# Patient Record
Sex: Female | Born: 1985
Health system: Southern US, Community
[De-identification: ages and names within clinical notes are randomized; demographics above are authoritative.]

## PROBLEM LIST (undated history)

## (undated) DIAGNOSIS — E039 Hypothyroidism, unspecified: Secondary | ICD-10-CM

## (undated) DIAGNOSIS — F4310 Post-traumatic stress disorder, unspecified: Secondary | ICD-10-CM

## (undated) DIAGNOSIS — R51 Headache: Secondary | ICD-10-CM

## (undated) DIAGNOSIS — F419 Anxiety disorder, unspecified: Secondary | ICD-10-CM

## (undated) DIAGNOSIS — E119 Type 2 diabetes mellitus without complications: Secondary | ICD-10-CM

## (undated) DIAGNOSIS — E7800 Pure hypercholesterolemia, unspecified: Secondary | ICD-10-CM

## (undated) DIAGNOSIS — E669 Obesity, unspecified: Secondary | ICD-10-CM

## (undated) DIAGNOSIS — E785 Hyperlipidemia, unspecified: Secondary | ICD-10-CM

## (undated) DIAGNOSIS — F32A Depression, unspecified: Secondary | ICD-10-CM

## (undated) DIAGNOSIS — I10 Essential (primary) hypertension: Secondary | ICD-10-CM

## (undated) DIAGNOSIS — G932 Benign intracranial hypertension: Secondary | ICD-10-CM

## (undated) DIAGNOSIS — F410 Panic disorder [episodic paroxysmal anxiety] without agoraphobia: Secondary | ICD-10-CM

## (undated) DIAGNOSIS — E282 Polycystic ovarian syndrome: Secondary | ICD-10-CM

## (undated) HISTORY — DX: Hypothyroidism, unspecified: E03.9

## (undated) HISTORY — DX: Obesity, unspecified: E66.9

## (undated) HISTORY — DX: Post-traumatic stress disorder, unspecified: F43.10

## (undated) HISTORY — DX: Anxiety disorder, unspecified: F41.9

## (undated) HISTORY — DX: Type 2 diabetes mellitus without complications: E11.9

## (undated) HISTORY — PX: LUMBAR PUNCTURE: SHX1985

## (undated) HISTORY — DX: Pure hypercholesterolemia, unspecified: E78.00

## (undated) HISTORY — DX: Essential (primary) hypertension: I10

## (undated) HISTORY — DX: Panic disorder (episodic paroxysmal anxiety): F41.0

## (undated) HISTORY — DX: Depression, unspecified: F32.A

## (undated) HISTORY — DX: Hyperlipidemia, unspecified: E78.5

---

## 2005-03-22 ENCOUNTER — Emergency Department (HOSPITAL_COMMUNITY): Admission: EM | Admit: 2005-03-22 | Discharge: 2005-03-23 | Payer: Self-pay | Admitting: Emergency Medicine

## 2005-03-28 ENCOUNTER — Ambulatory Visit: Payer: Self-pay | Admitting: Internal Medicine

## 2005-03-30 ENCOUNTER — Ambulatory Visit: Payer: Self-pay | Admitting: Internal Medicine

## 2005-09-23 ENCOUNTER — Ambulatory Visit: Payer: Self-pay | Admitting: Internal Medicine

## 2008-07-31 ENCOUNTER — Emergency Department (HOSPITAL_COMMUNITY): Admission: EM | Admit: 2008-07-31 | Discharge: 2008-07-31 | Payer: Self-pay | Admitting: Emergency Medicine

## 2008-09-03 ENCOUNTER — Ambulatory Visit (HOSPITAL_COMMUNITY): Admission: RE | Admit: 2008-09-03 | Discharge: 2008-09-03 | Payer: Self-pay | Admitting: Neurology

## 2008-09-23 ENCOUNTER — Ambulatory Visit (HOSPITAL_COMMUNITY): Admission: RE | Admit: 2008-09-23 | Discharge: 2008-09-23 | Payer: Self-pay | Admitting: Obstetrics & Gynecology

## 2011-06-05 ENCOUNTER — Inpatient Hospital Stay (INDEPENDENT_AMBULATORY_CARE_PROVIDER_SITE_OTHER)
Admission: RE | Admit: 2011-06-05 | Discharge: 2011-06-05 | Disposition: A | Discharge status: Home / Self Care | Payer: 59 | Source: Ambulatory Visit | Attending: Family Medicine | Admitting: Family Medicine

## 2011-06-05 DIAGNOSIS — H109 Unspecified conjunctivitis: Secondary | ICD-10-CM

## 2011-07-26 LAB — GLUCOSE, RANDOM: Glucose, Bld: 80

## 2011-07-26 LAB — CSF CELL COUNT WITH DIFFERENTIAL
Eosinophils, CSF: 0
Monocyte-Macrophage-Spinal Fluid: 2 — ABNORMAL LOW
Other Cells, CSF: 0
RBC Count, CSF: 9200 — ABNORMAL HIGH
Segmented Neutrophils-CSF: 59 — ABNORMAL HIGH
Tube #: 1
WBC, CSF: 16 — ABNORMAL HIGH

## 2011-07-26 LAB — OLIGOCLONAL BANDS, CSF + SERM
Albumin Index: 4.6 ratio (ref 0.0–9.0)
CSF Oligoclonal Bands: NEGATIVE
IgG, CSF: 3.8 mg/dL (ref 0.0–6.0)
IgG, Serum: 1380 mg/dL (ref 768–1632)
MS CNS IgG Synthesis Rate: <0 mg/d (ref <=8.0)

## 2011-07-26 LAB — ANGIOTENSIN CONVERTING ENZYME, CSF: Angio Convert Enzyme: 3 Units (ref <10)

## 2011-07-26 LAB — VDRL, CSF: VDRL Quant, CSF: NONREACTIVE

## 2011-09-03 ENCOUNTER — Emergency Department (HOSPITAL_COMMUNITY): Payer: 59

## 2011-09-03 ENCOUNTER — Encounter: Payer: Self-pay | Admitting: Emergency Medicine

## 2011-09-03 ENCOUNTER — Emergency Department (HOSPITAL_COMMUNITY)
Admission: EM | Admit: 2011-09-03 | Discharge: 2011-09-03 | Disposition: A | Discharge status: Home / Self Care | Payer: 59 | Attending: Emergency Medicine | Admitting: Emergency Medicine

## 2011-09-03 DIAGNOSIS — M545 Low back pain, unspecified: Secondary | ICD-10-CM | POA: Insufficient documentation

## 2011-09-03 DIAGNOSIS — E669 Obesity, unspecified: Secondary | ICD-10-CM | POA: Insufficient documentation

## 2011-09-03 DIAGNOSIS — R109 Unspecified abdominal pain: Secondary | ICD-10-CM | POA: Insufficient documentation

## 2011-09-03 DIAGNOSIS — R509 Fever, unspecified: Secondary | ICD-10-CM | POA: Insufficient documentation

## 2011-09-03 DIAGNOSIS — R51 Headache: Secondary | ICD-10-CM | POA: Insufficient documentation

## 2011-09-03 DIAGNOSIS — R112 Nausea with vomiting, unspecified: Secondary | ICD-10-CM | POA: Insufficient documentation

## 2011-09-03 DIAGNOSIS — R61 Generalized hyperhidrosis: Secondary | ICD-10-CM | POA: Insufficient documentation

## 2011-09-03 DIAGNOSIS — R42 Dizziness and giddiness: Secondary | ICD-10-CM | POA: Insufficient documentation

## 2011-09-03 DIAGNOSIS — R110 Nausea: Secondary | ICD-10-CM | POA: Insufficient documentation

## 2011-09-03 DIAGNOSIS — R5381 Other malaise: Secondary | ICD-10-CM | POA: Insufficient documentation

## 2011-09-03 DIAGNOSIS — F17200 Nicotine dependence, unspecified, uncomplicated: Secondary | ICD-10-CM | POA: Insufficient documentation

## 2011-09-03 DIAGNOSIS — Z79899 Other long term (current) drug therapy: Secondary | ICD-10-CM | POA: Insufficient documentation

## 2011-09-03 LAB — URINALYSIS, ROUTINE W REFLEX MICROSCOPIC
Bilirubin Urine: NEGATIVE
Glucose, UA: NEGATIVE mg/dL
Ketones, ur: NEGATIVE mg/dL
Urobilinogen, UA: 0.2 mg/dL (ref 0.0–1.0)
pH: 6.5 (ref 5.0–8.0)

## 2011-09-03 LAB — URINE MICROSCOPIC-ADD ON

## 2011-09-03 LAB — COMPREHENSIVE METABOLIC PANEL
Albumin: 3.5 g/dL (ref 3.5–5.2)
CO2: 27 mEq/L (ref 19–32)
Calcium: 9.3 mg/dL (ref 8.4–10.5)
Chloride: 100 mEq/L (ref 96–112)
Creatinine, Ser: 1.09 mg/dL (ref 0.50–1.10)
Glucose, Bld: 111 mg/dL — ABNORMAL HIGH (ref 70–99)

## 2011-09-03 LAB — CBC
Hemoglobin: 12.1 g/dL (ref 12.0–15.0)
MCH: 28.1 pg (ref 26.0–34.0)
MCV: 83.8 fL (ref 78.0–100.0)
WBC: 10.5 10*3/uL (ref 4.0–10.5)

## 2011-09-03 LAB — DIFFERENTIAL
Basophils Relative: 0 % (ref 0–1)
Lymphocytes Relative: 26 % (ref 12–46)
Monocytes Relative: 9 % (ref 3–12)

## 2011-09-03 LAB — PREGNANCY, URINE: Preg Test, Ur: NEGATIVE

## 2011-09-03 MED ORDER — OXYCODONE-ACETAMINOPHEN 5-325 MG PO TABS: 2.0000 | ORAL_TABLET | ORAL | Status: AC | PRN

## 2011-09-03 MED ORDER — HYDROMORPHONE HCL PF 1 MG/ML IJ SOLN
INTRAMUSCULAR | Status: AC
  Filled 2011-09-03: qty 1

## 2011-09-03 MED ORDER — SODIUM CHLORIDE 0.9 % IV SOLN
INTRAVENOUS | Status: DC
  Administered 2011-09-03: 125 mL via INTRAVENOUS

## 2011-09-03 MED ORDER — SODIUM CHLORIDE 0.9 % IV BOLUS (SEPSIS)
500.0000 mL | Freq: Once | INTRAVENOUS | Status: AC
  Administered 2011-09-03: 09:00:00 via INTRAVENOUS

## 2011-09-03 MED ORDER — ONDANSETRON HCL 4 MG/2ML IJ SOLN
4.0000 mg | Freq: Once | INTRAMUSCULAR | Status: AC
  Administered 2011-09-03: 4 mg via INTRAVENOUS
  Filled 2011-09-03: qty 2

## 2011-09-03 MED ORDER — HYDROMORPHONE HCL PF 1 MG/ML IJ SOLN
1.0000 mg | Freq: Once | INTRAMUSCULAR | Status: AC
  Administered 2011-09-03: 1 mg via INTRAVENOUS

## 2011-09-03 MED ORDER — ONDANSETRON 8 MG PO TBDP: 8.0000 mg | ORAL_TABLET | Freq: Three times a day (TID) | ORAL | Status: DC | PRN

## 2011-09-03 NOTE — ED Notes (Signed)
Pt states it started off she was having some back pain then she started having nausea, dizzy, light headed, feverish, clammy  Pt states she has been vomiting since Tuesday

## 2011-09-03 NOTE — ED Notes (Signed)
Bed:WA01<BR> Expected date:<BR> Expected time:<BR> Means of arrival:<BR> Comments:<BR> closed

## 2011-09-03 NOTE — ED Notes (Signed)
Pt presenting to ed with c/o headache, nausea, vomiting and dizziness x 1 week. Pt is alert and oriented at this time. Pt's airway is intact pt is in nad at this time.

## 2011-09-03 NOTE — ED Notes (Signed)
Pt medicated per md orders. Pt resting quietly in bed. Family is at bedside

## 2011-09-03 NOTE — ED Notes (Signed)
Pt resting in bed quietly at this time. Family is at bedside

## 2011-09-03 NOTE — ED Provider Notes (Signed)
History     CSN: [id number] Arrival date & time: 09/03/2011  6:33 AM   First MD Initiated Contact with Patient 09/03/11 0805      Chief Complaint  Patient presents with  . Dizziness    (Consider location/radiation/quality/duration/timing/severity/associated sxs/prior treatment) The history is provided by the patient.   patient states that she hurt her back to work a few weeks ago. She been taking naproxen and an occasional Vicodin for her. Since then she's had headaches nausea and his dizziness as both lightheadedness and sensation of movement. She states she's been clammy. She states that she has had nauseousness and vomiting. She states she doesn't know if the headache associated with nausea and vomiting. She's also had low back pain. Her last period was able, but she states she has irregular periods. States she feels better overall. She states she has left-sided headache. She did not hit her head. She has lower back pain also. No numbness or weakness.  History reviewed. No pertinent past medical history.  History reviewed. No pertinent past surgical history.  Family History  Problem Relation Age of Onset  . Cancer Other   . Coronary artery disease Other   . Diabetes Other     History  Substance Use Topics  . Smoking status: Current Everyday Smoker -- 0.2 packs/day    Types: Cigarettes  . Smokeless tobacco: Not on file  . Alcohol Use: No    OB History    Grav Para Term Preterm Abortions TAB SAB Ect Mult Living                  Review of Systems  Constitutional: Positive for fever, chills, diaphoresis and fatigue. Negative for activity change and appetite change.  HENT: Negative for congestion, neck pain and neck stiffness.   Eyes: Negative for pain.  Respiratory: Negative for cough, chest tightness and shortness of breath.   Cardiovascular: Negative for chest pain and leg swelling.  Gastrointestinal: Positive for nausea, vomiting and abdominal pain. Negative for  diarrhea.  Genitourinary: Negative for dysuria and flank pain.  Musculoskeletal: Positive for back pain. Negative for joint swelling.  Skin: Negative for rash.  Neurological: Positive for dizziness, light-headedness and headaches. Negative for weakness and numbness.  Psychiatric/Behavioral: Negative for behavioral problems.    Allergies  Review of patient's allergies indicates no known allergies.  Home Medications   Current Outpatient Rx  Name Route Sig Dispense Refill  . ANALGESIC BALM 15-15 % EX OINT Topical Apply 1 application topically as needed. For back pain.     Marland Kitchen OVER THE COUNTER MEDICATION Transdermal Place 1 patch onto the skin every 8 (eight) hours as needed. Equate Menthol Patch 5%. She uses for back pain.     Marland Kitchen PRENATAL 27-0.8 MG PO TABS Oral Take 1 tablet by mouth daily.      Marland Kitchen PROMETHAZINE HCL 25 MG PO TABS Oral Take 25 mg by mouth every 6 (six) hours as needed. For nausea.    Marland Kitchen ONDANSETRON 8 MG PO TBDP Oral Take 1 tablet (8 mg total) by mouth every 8 (eight) hours as needed for nausea. 20 tablet 0  . OXYCODONE-ACETAMINOPHEN 5-325 MG PO TABS Oral Take 2 tablets by mouth every 4 (four) hours as needed for pain. 15 tablet 0    BP 157/68  Pulse 62  Temp(Src) 97.7 F (36.5 C) (Oral)  Resp 20  SpO2 98%  LMP 01/23/2011  Physical Exam  Nursing note and vitals reviewed. Constitutional: She is oriented to person,  place, and time. She appears well-developed and well-nourished.       Patient is obese  HENT:  Head: Normocephalic and atraumatic.  Eyes: EOM are normal. Pupils are equal, round, and reactive to light.  Neck: Normal range of motion. Neck supple.  Cardiovascular: Normal rate, regular rhythm and normal heart sounds.   Pulmonary/Chest: Effort normal and breath sounds normal. No respiratory distress. She has no wheezes. She has no rales.  Abdominal: Soft. Bowel sounds are normal. She exhibits no distension. There is no tenderness. There is no rebound and no  guarding.  Musculoskeletal: Normal range of motion.       Mild lumbar tenderness without step-off or deformity.  Neurological: She is alert and oriented to person, place, and time. No cranial nerve deficit.  Skin: Skin is warm and dry.  Psychiatric: She has a normal mood and affect. Her speech is normal.    ED Course  Procedures (including critical care time)  Labs Reviewed  COMPREHENSIVE METABOLIC PANEL - Abnormal; Notable for the following:    Glucose, Bld 111 (*)    Total Bilirubin 0.2 (*)    GFR calc non Af Amer 70 (*)    GFR calc Af Amer 81 (*)    All other components within normal limits  URINALYSIS, ROUTINE W REFLEX MICROSCOPIC - Abnormal; Notable for the following:    Appearance CLOUDY (*)    Hgb urine dipstick TRACE (*)    Leukocytes, UA SMALL (*)    All other components within normal limits  URINE MICROSCOPIC-ADD ON - Abnormal; Notable for the following:    Squamous Epithelial / LPF MANY (*)    Bacteria, UA FEW (*)    All other components within normal limits  CBC  DIFFERENTIAL  PREGNANCY, URINE   Ct Head Wo Contrast  09/03/2011  *RADIOLOGY REPORT*  Clinical Data: Headache  CT HEAD WITHOUT CONTRAST  Technique:  Contiguous axial images were obtained from the base of the skull through the vertex without contrast.  Comparison: 09/03/2008  Findings: The brain has a normal appearance without evidence for hemorrhage, infarction, hydrocephalus, or mass lesion.  There is no extra axial fluid collection.  The skull and paranasal sinuses are normal.  IMPRESSION: Negative exam.  Original Report Authenticated By: Rosealee Albee, M.D.     1. Headache   2. Dizziness       MDM  The patient has had headache dizziness and multiple other complaints. She states that she's been vomiting since Tuesday she does not appear as a static. She does not have any nystagmus. Head CT is normal. Lab work is reassuring. She will need followup with her PCP and maybe neurology. She was given  neurologist information. Command and having to have her intracranial pressure check.        Juliet Rude. Rubin Payor, MD 09/03/11 1255

## 2011-09-09 ENCOUNTER — Emergency Department (HOSPITAL_COMMUNITY): Payer: 59

## 2011-09-09 ENCOUNTER — Inpatient Hospital Stay (HOSPITAL_COMMUNITY)
Admission: EM | Admit: 2011-09-09 | Discharge: 2011-09-10 | DRG: 103 | Disposition: A | Discharge status: Home / Self Care | Payer: 59 | Attending: Neurology | Admitting: Neurology

## 2011-09-09 ENCOUNTER — Encounter (HOSPITAL_COMMUNITY): Payer: Self-pay

## 2011-09-09 DIAGNOSIS — H539 Unspecified visual disturbance: Secondary | ICD-10-CM | POA: Diagnosis present

## 2011-09-09 DIAGNOSIS — Z6841 Body Mass Index (BMI) 40.0 and over, adult: Secondary | ICD-10-CM

## 2011-09-09 DIAGNOSIS — R519 Headache, unspecified: Secondary | ICD-10-CM | POA: Diagnosis present

## 2011-09-09 DIAGNOSIS — Z79899 Other long term (current) drug therapy: Secondary | ICD-10-CM

## 2011-09-09 DIAGNOSIS — F17200 Nicotine dependence, unspecified, uncomplicated: Secondary | ICD-10-CM | POA: Diagnosis present

## 2011-09-09 DIAGNOSIS — G932 Benign intracranial hypertension: Principal | ICD-10-CM | POA: Diagnosis present

## 2011-09-09 DIAGNOSIS — R51 Headache: Secondary | ICD-10-CM | POA: Diagnosis present

## 2011-09-09 HISTORY — DX: Benign intracranial hypertension: G93.2

## 2011-09-09 HISTORY — DX: Polycystic ovarian syndrome: E28.2

## 2011-09-09 HISTORY — DX: Headache: R51

## 2011-09-09 LAB — COMPREHENSIVE METABOLIC PANEL
ALT: 28 U/L (ref 0–35)
Alkaline Phosphatase: 94 U/L (ref 39–117)
BUN: 10 mg/dL (ref 6–23)
CO2: 28 mEq/L (ref 19–32)
Chloride: 103 mEq/L (ref 96–112)
GFR calc Af Amer: 83 mL/min — ABNORMAL LOW (ref >90)
GFR calc non Af Amer: 71 mL/min — ABNORMAL LOW (ref >90)
Glucose, Bld: 115 mg/dL — ABNORMAL HIGH (ref 70–99)
Potassium: 3.5 mEq/L (ref 3.5–5.1)
Sodium: 139 mEq/L (ref 135–145)
Total Bilirubin: 0.3 mg/dL (ref 0.3–1.2)
Total Protein: 6.7 g/dL (ref 6.0–8.3)

## 2011-09-09 LAB — DIFFERENTIAL
Basophils Absolute: 0 10*3/uL (ref 0.0–0.1)
Basophils Relative: 0 % (ref 0–1)
Eosinophils Relative: 1 % (ref 0–5)
Monocytes Absolute: 0.7 10*3/uL (ref 0.1–1.0)
Neutro Abs: 5.8 10*3/uL (ref 1.7–7.7)

## 2011-09-09 LAB — PROTIME-INR: INR: 1.15 (ref 0.00–1.49)

## 2011-09-09 LAB — CBC
HCT: 32.8 % — ABNORMAL LOW (ref 36.0–46.0)
MCHC: 32.6 g/dL (ref 30.0–36.0)
Platelets: 225 10*3/uL (ref 150–400)
RDW: 15.3 % (ref 11.5–15.5)
WBC: 10.2 10*3/uL (ref 4.0–10.5)

## 2011-09-09 LAB — APTT: aPTT: 38 seconds — ABNORMAL HIGH (ref 24–37)

## 2011-09-09 MED ORDER — PROMETHAZINE HCL 25 MG PO TABS: 25.0000 mg | ORAL_TABLET | Freq: Four times a day (QID) | ORAL | Status: DC | PRN

## 2011-09-09 MED ORDER — ACETAMINOPHEN 650 MG RE SUPP: 650.0000 mg | Freq: Four times a day (QID) | RECTAL | Status: DC | PRN

## 2011-09-09 MED ORDER — ONDANSETRON HCL 4 MG PO TABS: 4.0000 mg | ORAL_TABLET | Freq: Four times a day (QID) | ORAL | Status: DC | PRN

## 2011-09-09 MED ORDER — OXYCODONE-ACETAMINOPHEN 5-325 MG PO TABS: 2.0000 | ORAL_TABLET | ORAL | Status: DC | PRN

## 2011-09-09 MED ORDER — PROMETHAZINE HCL 25 MG/ML IJ SOLN
12.5000 mg | Freq: Four times a day (QID) | INTRAMUSCULAR | Status: DC | PRN
  Filled 2011-09-09: qty 1

## 2011-09-09 MED ORDER — ONDANSETRON HCL 4 MG/2ML IJ SOLN: 4.0000 mg | Freq: Four times a day (QID) | INTRAMUSCULAR | Status: DC | PRN

## 2011-09-09 MED ORDER — ZOLPIDEM TARTRATE 10 MG PO TABS: 10.0000 mg | ORAL_TABLET | Freq: Every evening | ORAL | Status: DC | PRN

## 2011-09-09 MED ORDER — PRENATAL 27-0.8 MG PO TABS
1.0000 | ORAL_TABLET | Freq: Every day | ORAL | Status: DC
  Administered 2011-09-09 – 2011-09-10 (×2): 1 via ORAL
  Filled 2011-09-09 (×2): qty 1

## 2011-09-09 MED ORDER — ACETAZOLAMIDE ER 500 MG PO CP12
500.0000 mg | ORAL_CAPSULE | Freq: Two times a day (BID) | ORAL | Status: DC
  Administered 2011-09-09 – 2011-09-10 (×2): 500 mg via ORAL
  Filled 2011-09-09 (×4): qty 1

## 2011-09-09 MED ORDER — SODIUM CHLORIDE 0.9 % IJ SOLN: 3.0000 mL | INTRAMUSCULAR | Status: DC | PRN

## 2011-09-09 MED ORDER — ACETAMINOPHEN 325 MG PO TABS: 650.0000 mg | ORAL_TABLET | Freq: Four times a day (QID) | ORAL | Status: DC | PRN

## 2011-09-09 MED ORDER — SENNOSIDES-DOCUSATE SODIUM 8.6-50 MG PO TABS: 1.0000 | ORAL_TABLET | Freq: Every day | ORAL | Status: DC | PRN

## 2011-09-09 MED ORDER — ALUM & MAG HYDROXIDE-SIMETH 200-200-20 MG/5ML PO SUSP: 30.0000 mL | Freq: Four times a day (QID) | ORAL | Status: DC | PRN

## 2011-09-09 NOTE — ED Notes (Signed)
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  Patient needs a lumbar puncture to relieve pressure to eye.  Called [facility] and spoke with Verlon Au, Pensions consultant.  Patient reports blurred vision to both eyes and decrease in vision.

## 2011-09-09 NOTE — H&P (Signed)
Admission H&P     Chief Complaint: Headache and blurry vision HPI: Lindsey [name] is an 25 y.o. female who reports that she was diagnosed with BIH in 2009.  Had headache and vision problems at that time.  Patient was placed on fluid pills and potassium. She reports that her visual complaints improved as well as her headache within the course of about a month. She was left with some decrease in peripheral vision that was noted bilaterally. Patient state she was on medication for approximately 3 months. Medication was stopped at that time and patient was lost to followup. Patient reports that approximately one to 2 weeks ago began to have headache. Describes the headache as being throbbing and on the top of her head. Was worse when she was on her right side. Headache was associated with nausea, vomiting and photophobia. The patient over the past week began to notice that her vision began to become blurry. Also noted that she was having worsening of her peripheral vision, particularly on the right. Patient saw an ophthalmologist today. Was told that there was increased pressure on her spine and was sent to the emergency room for further evaluation. LP was performed here in the emergency room by interventional radiology. Opening pressure was 60. Closing pressure was 24. 21 cc of CSF was removed. CSF was documented as clear and colorless.  Past Medical History  Diagnosis Date  . Headache   . Polycystic ovary     Past Surgical History  Procedure Date  . Lumbar puncture     Family History  Problem Relation Age of Onset  . Cancer Other   . Coronary artery disease Other   . Diabetes Other   . Polycystic ovary syndrome Mother    Social History:  reports that she has been smoking Cigarettes.  She has been smoking about .25 packs per day. She does not have any smokeless tobacco history on file. She reports that she uses illicit drugs (Marijuana). She reports that she does not drink alcohol.  Works in  Clinical biochemist.  Allergies: No Known Allergies  No current facility-administered medications on file as of 09/09/2011.   Medications Prior to Admission  Medication Sig Dispense Refill  . oxyCODONE-acetaminophen (PERCOCET) 5-325 MG per tablet Take 2 tablets by mouth every 4 (four) hours as needed for pain.  15 tablet  0  . Prenatal Vit-Fe Fumarate-FA (MULTIVITAMIN-PRENATAL) 27-0.8 MG TABS Take 1 tablet by mouth daily.        . promethazine (PHENERGAN) 25 MG tablet Take 25 mg by mouth every 6 (six) hours as needed. For nausea.        ROS: History obtained from the patient  General ROS: negative for - chills, fatigue, fever, night sweats Psychological ROS: negative for - behavioral disorder, hallucinations, memory difficulties, mood swings or suicidal ideation Ophthalmic ROS: as noted in HPI ENT ROS: negative for - epistaxis, nasal discharge, oral lesions, sore throat, tinnitus or vertigo Allergy and Immunology ROS: negative for - hives or itchy/watery eyes Hematological and Lymphatic ROS: negative for - bleeding problems, bruising or swollen lymph nodes Endocrine ROS: negative for - galactorrhea, hair pattern changes, polydipsia/polyuria or temperature intolerance Respiratory ROS: negative for - cough, hemoptysis, shortness of breath or wheezing Cardiovascular ROS: negative for - chest pain, dyspnea on exertion, edema or irregular heartbeat Gastrointestinal ROS: negative for - abdominal pain, diarrhea, hematemesis, nausea/vomiting or stool incontinence Genito-Urinary ROS: negative for - dysuria, hematuria, incontinence or urinary frequency/urgency Musculoskeletal ZOX:WRUE pain Neurological ROS: as noted  in HPI Dermatological ROS: negative for rash and skin lesion changes  Physical Examination: Blood pressure 150/67, pulse 77, temperature 98.3 F (36.8 C), temperature source Oral, resp. rate 18, last menstrual period 01/23/2011, SpO2 100.00%.  HEENT - Normocephalic, without obvious  abnormality, atraumatic, conjunctivae/corneas clear, normal TM's and external ear canals both ears, normal nose and  Normal throat Cardiovascular - regular rate and rhythm, single S1, S2 normal Lungs - chest clear, no wheezing, rales, normal symmetric air entry,  Abdomen - soft, non-tender; bowel sounds normal; no masses,  no organomegaly Extremities - mild ankle edema bilaterally  Neurologic Examination: Mental Status: Alert, oriented, thought content appropriate.  Speech fluent without evidence of aphasia.  Able to follow 3 step commands without difficulty. Cranial Nerves: II: Decreased visual field to the right. Able to identify wiggling fingers to the left.  Pupils equal, round at 5-6 mm each due to recent dilation. Unreactive bilaterally. III,IV, VI: ptosis not present, extraocular muscles extra-ocular motions intact bilaterally V,VII: smile symmetric, facial light touch sensation normal bilaterally VIII: hearing normal bilaterally IX,X: gag reflex present XI: trapezius strength/neck flexion strength normal bilaterally XII: tongue strength normal  Motor: Right : Upper extremity    Left:     Upper extremity 5/5 deltoid       5/5 deltoid 5/5 biceps      5/5 biceps  5/5 triceps      5/5 triceps 5/5wrist flexion     5/5 wrist flexion 5/5 wrist extension     5/5 wrist extension 5/5 hand grip      5/5 hand grip  Lower extremity     Lower extremity 5/5 hip flexor      5/5 hip flexor 5/5 hip adductors     5/5 hip adductors 5/5 hip abductors     5/5 hip abductors 5/5 quadricep      5/5 quadriceps  5/5 hamstrings     5/5 hamstrings 5/5 plantar flexion       5/5 plantar flexion 5/5 plantar extension     5/5 plantar extension Tone and bulk:normal tone throughout; no atrophy noted Sensory: Pinprick and light touch intact throughout, bilaterally Deep Tendon Reflexes: 1+ and symmetric throughout in the upper extremities, absent in the lower extremities Plantars: Right: downgoing   Left:  downgoing Cerebellar: normal finger-to-nose, normal rapid alternating movements and normal heel-to-shin test   No results found for this or any previous visit (from the past 48 hour(s)). Dg Fluoro Guide Ndl Plc/bx  09/09/2011  *RADIOLOGY REPORT*  Clinical Data:  Pseudotumor cerebral height, headache, visual changes  DIAGNOSTIC LUMBAR PUNCTURE UNDER FLUOROSCOPIC GUIDANCE  Fluoroscopy time:  3.3 minutes.  Technique:  Informed consent was obtained from the patient prior to the procedure, including potential complications of headache, allergy, and pain.   With the patient prone, the lower back was prepped with Betadine.  1% Lidocaine was used for local anesthesia. Lumbar puncture was performed at the L3-L4 level using a 6 inch  20 gauge gauge needle with return of clear CSF with an opening pressure of 60 cm water.   21 ml of CSF were obtained for laboratory studies.  Closing pressure equal 24 cm water.  The patient tolerated the procedure well and there were no apparent complications.  Of note,  the 6 inch spinal needle needed  to be advanced to its hub at the L3-L4 level.  IMPRESSION:  1.  Successful lumbar puncture. 2.  21 ml of CSF removed. 3.  Opening pressure equal 60 cm water  and closing pressure equal 24 cm water.  Original Report Authenticated By: Genevive Bi, M.D.    Assessment/Plan  Patient Active Hospital Problem List: Pseudotumor cerebri (09/09/2011)   Assessment: CSF pressure elevated with LP.  Unclear if visual symptoms have improved secondary to recent dilation but headache improved.  Will need to start diuretic therapy.   Plan: Diamox 500mg  BID.  Will follow potassium Headache (09/09/2011)   Assessment: Improved with spinal tap.     Plan: Will continue meds she has been taking on an outpatient basis as necessary for pain prn.   Vision disturbance (09/09/2011)   Assessment: Unable to fully evaluate at this time   Plan: .Will re-evaluate in the morning once dilation has worn off       Thana Farr, MD [facility] [phone] 09/09/2011, 5:34 PM

## 2011-09-09 NOTE — ED Notes (Signed)
Awaiting change of shift to transport to room.

## 2011-09-09 NOTE — ED Notes (Signed)
Report endorsed to oncoming nurse 

## 2011-09-09 NOTE — Procedures (Signed)
Lumbar Puncture Procedure Note  Pre-operative Diagnosis: Pseudutumor, headache   Post-operative Diagnosis: psuedotumor, headache   Indications: Diagnostic  Procedure Details   Consent: Informed consent was obtained. Risks of the procedure were discussed including: infection, bleeding, pain and headache.  The patient was positioned under sterile conditions. Betadine solution and sterile drapes were utilized. A spinal needle was inserted at the L3 - L4 interspace.  Spinal fluid was obtained and sent to the laboratory.  Findings 21mL of clear spinal fluid was obtained. Opening Pressure: 60 cm H2O pressure. Closing Pressure: 24 cm H2O pressure.  Complications:  None; patient tolerated the procedure well.

## 2011-09-09 NOTE — ED Notes (Signed)
Pt transported to room

## 2011-09-09 NOTE — ED Notes (Signed)
Pt returned from Radiology s/p LP, denies pain or discomfort, states she feel a little relief, pending neuro consult

## 2011-09-09 NOTE — ED Notes (Signed)
Pt remains flat s/p LP, no c/o pain or discomfort

## 2011-09-09 NOTE — ED Provider Notes (Signed)
History     CSN: [id number] Arrival date & time: 09/09/2011 11:29 AM   Chief Complaint  Patient presents with  . Loss of Vision    HPI Pt was seen at 1215.  Per pt, c/o gradual onset and worsening of persistent blurry vision, worsening right peripheral visual field cuts, and headache that began approx 1-2 weeks PTA.  Describes the headache as generalized and "throbbing," and associated with N/V.  Pt has hx of right visual field cuts in bilat eyes that began approx 2009, dx at that time with pseudotumor cerebri, tx "water pill" which she stopped taking after several months.  Pt states she has not gone back to see her Neuro MD at [facility] since that time because of "money issues."  Pt was eval at an Optometrist PTA, sent to the ED for LP due to findings of optic nerve pressure and atrophy.  Pt denies fevers, no focal motor weakness, no tingling/numbness in extremities.     Past Medical History  Diagnosis Date  . Headache   . Polycystic ovary   . Pseudotumor cerebri     Past Surgical History  Procedure Date  . Lumbar puncture     Family History  Problem Relation Age of Onset  . Cancer Other   . Coronary artery disease Other   . Diabetes Other   . Polycystic ovary syndrome Mother     History  Substance Use Topics  . Smoking status: Current Everyday Smoker -- 0.2 packs/day    Types: Cigarettes  . Smokeless tobacco: Not on file  . Alcohol Use: No   Review of Systems ROS: Statement: All systems negative except as marked or noted in the HPI; Constitutional: Negative for fever and chills. ; ; Eyes: +blurred vision.  Negative for eye pain, redness and discharge. ; ; ENMT: Negative for ear pain, hoarseness, nasal congestion, sinus pressure and sore throat. ; ; Cardiovascular: Negative for chest pain, palpitations, diaphoresis, dyspnea and peripheral edema. ; ; Respiratory: Negative for cough, wheezing and stridor. ; ; Gastrointestinal: +N/V. Negative for diarrhea and  abdominal pain, blood in stool, hematemesis, jaundice and rectal bleeding. . ; ; Genitourinary: Negative for dysuria, flank pain and hematuria. ; ; Musculoskeletal: Negative for back pain and neck pain. Negative for swelling and trauma.; ; Skin: Negative for pruritus, rash, abrasions, blisters, bruising and skin lesion.; ; Neuro: +headache.  Negative for lightheadedness and neck stiffness. Negative for weakness, altered level of consciousness , altered mental status, extremity weakness, paresthesias, involuntary movement, seizure and syncope.    Allergies  Review of patient's allergies indicates no known allergies.  Home Medications  No current outpatient prescriptions on file.  BP 131/75  Pulse 72  Temp(Src) 98.7 F (37.1 C) (Oral)  Resp 24  Ht 5\' 6"  (1.676 m)  Wt 370 lb (167.831 kg)  BMI 59.72 kg/m2  SpO2 99%  LMP 01/23/2011   Physical Exam 1220: Physical examination:  Nursing notes reviewed; Vital signs and O2 SAT reviewed;  Constitutional: Well developed, Well nourished, Well hydrated, In no acute distress; Head:  Normocephalic, atraumatic; Eyes: EOMI, +pupils dilated to 85mm/NR bilat, No scleral icterus; ENMT: Mouth and pharynx normal, Mucous membranes moist; Neck: Supple, Full range of motion, No lymphadenopathy, no meningeal signs; Cardiovascular: Regular rate and rhythm, No murmur, rub, or gallop; Respiratory: Breath sounds clear & equal bilaterally, No rales, rhonchi, wheezes, or rub, Normal respiratory effort/excursion; Chest: Nontender, Movement normal; Abdomen: Soft, Nontender, Nondistended, Normal bowel sounds;Extremities: Pulses normal, No tenderness, No edema, No  calf edema or asymmetry.; Neuro: AA&Ox3, Major CN grossly intact. Strength 5/5 equal bilat UE's and LE's.  DTR 2/4 equal bilat UE's and LE's.  No gross sensory deficits.  Normal cerebellar testing bilat UE's and LE's.  Speech clear.  No facial droop.  No nystagmus.  Gait steady.; Skin: Color normal, Warm, Dry, no rash.     ED Course  Procedures   1230:  D/W Neuro Dr. Thad Ranger, case discussed, including:  Pt's eye dr report/request, HPI, pertinent PM/SHx, VS/PE, previous dx testing 6d ago:  States pt does not need another CT-H, can proceed with LP (diagnostic/therapeutic), call back after procedure.    MDM  MDM Reviewed: nursing note and vitals Interpretation: x-ray    Ct Head Wo Contrast 09/03/2011  *RADIOLOGY REPORT*  Clinical Data: Headache  CT HEAD WITHOUT CONTRAST  Technique:  Contiguous axial images were obtained from the base of the skull through the vertex without contrast.  Comparison: 09/03/2008  Findings: The brain has a normal appearance without evidence for hemorrhage, infarction, hydrocephalus, or mass lesion.  There is no extra axial fluid collection.  The skull and paranasal sinuses are normal.  IMPRESSION: Negative exam.  Original Report Authenticated By: Rosealee Albee, M.D.     Dg Fluoro Guide Ndl Plc/bx  09/09/2011  *RADIOLOGY REPORT*  Clinical Data:  Pseudotumor cerebral height, headache, visual changes  DIAGNOSTIC LUMBAR PUNCTURE UNDER FLUOROSCOPIC GUIDANCE  Fluoroscopy time:  3.3 minutes.  Technique:  Informed consent was obtained from the patient prior to the procedure, including potential complications of headache, allergy, and pain.   With the patient prone, the lower back was prepped with Betadine.  1% Lidocaine was used for local anesthesia. Lumbar puncture was performed at the L3-L4 level using a 6 inch  20 gauge gauge needle with return of clear CSF with an opening pressure of 60 cm water.   21 ml of CSF were obtained for laboratory studies.  Closing pressure equal 24 cm water.  The patient tolerated the procedure well and there were no apparent complications.  Of note,  the 6 inch spinal needle needed  to be advanced to its hub at the L3-L4 level.  IMPRESSION:  1.  Successful lumbar puncture. 2.  21 ml of CSF removed. 3.  Opening pressure equal 60 cm water and closing pressure  equal 24 cm water.  Original Report Authenticated By: Genevive Bi, M.D.    2:01 PM:  States she feels "ok" after LP, headache improved.  She "thinks" her blurry vision is improved also, but difficult to assess as pt's pupils are still dilated.  T/C to Neuro Dr. Thad Ranger, case discussed, including:  HPI, pertinent PM/SHx, VS/PE, dx testing, ED course and treatment.  Agreeable to admit.  She will eval pt in the ED.  Dx testing d/w pt and family.  Questions answered.  Verb understanding, agreeable to admit.         Quin Mcpherson Allison Quarry, DO 09/10/11 1414

## 2011-09-10 ENCOUNTER — Encounter (HOSPITAL_COMMUNITY): Payer: Self-pay | Admitting: Emergency Medicine

## 2011-09-10 MED ORDER — ACETAZOLAMIDE ER 500 MG PO CP12: 500.0000 mg | ORAL_CAPSULE | Freq: Two times a day (BID) | ORAL | Status: DC

## 2011-09-10 NOTE — Progress Notes (Signed)
Pt being discharged. Pt left via wheelchair. Pt given prescription by doctor and was instructed to fill and take her medicine as discussed. Patient did not have any IV access.

## 2011-09-10 NOTE — Discharge Summary (Signed)
Physician Discharge Summary   Patient ID: Lindsey [name] [id number] 25 y.o. 1986-03-27  Admit date: 09/09/2011  Discharge date and time: 09/10/2011, 5:33 PM  Admitting Physician: Pola Corn, MD   Discharge Physician: Pola Corn, MD  Admission Diagnoses: Pseudotumor cerebri [348.2] REFERRED HERE FOR A LUMBAR PUNCTURE  Discharge Diagnoses: Pseudotumor cerebri  Admission Condition: fair  Discharged Condition: good  Indication for Admission: Decreased vision   Hospital Course: Patient referred by Ophthalmology.  Received LP by IR and was found to have OP of 60 and CP of 24.  21cc removed.  Patient had resolution of her headache.  Diamox was started and blurry vision resolved as well but continued to have some peripheral vision loss to the right. Tolerated the Diamox without incident. Potassium at discharge of 3.5  Consults: none  Significant Diagnostic Studies: LP, Head CT  Treatments: Spinal fluid removal, Diuresis  Discharge Exam:  Mental Status:  Alert, oriented, thought content appropriate. Speech fluent without evidence of aphasia. Able to follow 3 step commands without difficulty.  Cranial Nerves:  II: Decreased visual field to the right. Able to identify wiggling fingers to the left. Pupils equal, round and reactive.  III,IV, VI: ptosis not present, extraocular muscles extra-ocular motions intact bilaterally  V,VII: smile symmetric, facial light touch sensation normal bilaterally  VIII: hearing normal bilaterally  IX,X: gag reflex present  XI: trapezius strength/neck flexion strength normal bilaterally  XII: tongue strength normal  Motor:  Right : Upper extremity Left: Upper extremity  5/5 deltoid 5/5 deltoid  5/5 biceps 5/5 biceps  5/5 triceps 5/5 triceps  5/5wrist flexion 5/5 wrist flexion  5/5 wrist extension 5/5 wrist extension  5/5 hand grip 5/5 hand grip  Lower extremity Lower extremity  5/5 hip flexor 5/5 hip flexor  5/5 hip adductors 5/5  hip adductors  5/5 hip abductors 5/5 hip abductors  5/5 quadricep 5/5 quadriceps  5/5 hamstrings 5/5 hamstrings  5/5 plantar flexion 5/5 plantar flexion  5/5 plantar extension 5/5 plantar extension  Tone and bulk:normal tone throughout; no atrophy noted  Sensory: Pinprick and light touch intact throughout, bilaterally  Deep Tendon Reflexes: 1+ and symmetric throughout in the upper extremities, absent in the lower extremities  Plantars:  Right: downgoing Left: downgoing  Cerebellar:  normal finger-to-nose, normal rapid alternating movements and normal heel-to-shin test      Disposition: Home or Self Care  Patient Instructions:  Current Discharge Medication List    START taking these medications   Details  acetaZOLAMIDE (DIAMOX) 500 MG capsule Take 1 capsule (500 mg total) by mouth every 12 (twelve) hours. Qty: 60 capsule, Refills: 0      CONTINUE these medications which have NOT CHANGED   Details  oxyCODONE-acetaminophen (PERCOCET) 5-325 MG per tablet Take 2 tablets by mouth every 4 (four) hours as needed for pain. Qty: 15 tablet, Refills: 0    Prenatal Vit-Fe Fumarate-FA (MULTIVITAMIN-PRENATAL) 27-0.8 MG TABS Take 1 tablet by mouth daily.      promethazine (PHENERGAN) 25 MG tablet Take 25 mg by mouth every 6 (six) hours as needed. For nausea.       Activity: no driving Diet: regular diet Wound Care: none needed  Follow-up with Dr. Modesto Charon in 1 week.  Signed: Thana Farr, MD [facility] [phone] 09/10/2011 5:33 PM

## 2013-05-20 ENCOUNTER — Ambulatory Visit: Payer: 59 | Admitting: Dietician

## 2013-05-23 ENCOUNTER — Emergency Department (HOSPITAL_COMMUNITY): Payer: 59

## 2013-05-23 ENCOUNTER — Emergency Department (HOSPITAL_COMMUNITY)
Admission: EM | Admit: 2013-05-23 | Discharge: 2013-05-23 | Disposition: A | Discharge status: Home / Self Care | Payer: 59 | Attending: Emergency Medicine | Admitting: Emergency Medicine

## 2013-05-23 DIAGNOSIS — G8929 Other chronic pain: Secondary | ICD-10-CM | POA: Insufficient documentation

## 2013-05-23 DIAGNOSIS — Z3202 Encounter for pregnancy test, result negative: Secondary | ICD-10-CM | POA: Insufficient documentation

## 2013-05-23 DIAGNOSIS — Z8669 Personal history of other diseases of the nervous system and sense organs: Secondary | ICD-10-CM | POA: Insufficient documentation

## 2013-05-23 DIAGNOSIS — F17200 Nicotine dependence, unspecified, uncomplicated: Secondary | ICD-10-CM | POA: Insufficient documentation

## 2013-05-23 DIAGNOSIS — H539 Unspecified visual disturbance: Secondary | ICD-10-CM | POA: Insufficient documentation

## 2013-05-23 DIAGNOSIS — R42 Dizziness and giddiness: Secondary | ICD-10-CM

## 2013-05-23 DIAGNOSIS — R0602 Shortness of breath: Secondary | ICD-10-CM | POA: Insufficient documentation

## 2013-05-23 DIAGNOSIS — M25473 Effusion, unspecified ankle: Secondary | ICD-10-CM | POA: Insufficient documentation

## 2013-05-23 DIAGNOSIS — Z8742 Personal history of other diseases of the female genital tract: Secondary | ICD-10-CM | POA: Insufficient documentation

## 2013-05-23 DIAGNOSIS — R0789 Other chest pain: Secondary | ICD-10-CM

## 2013-05-23 DIAGNOSIS — M25476 Effusion, unspecified foot: Secondary | ICD-10-CM | POA: Insufficient documentation

## 2013-05-23 DIAGNOSIS — Z79899 Other long term (current) drug therapy: Secondary | ICD-10-CM | POA: Insufficient documentation

## 2013-05-23 LAB — URINALYSIS, ROUTINE W REFLEX MICROSCOPIC
Bilirubin Urine: NEGATIVE
Hgb urine dipstick: NEGATIVE
Ketones, ur: NEGATIVE mg/dL
Specific Gravity, Urine: 1.037 — ABNORMAL HIGH (ref 1.005–1.030)
pH: 7.5 (ref 5.0–8.0)

## 2013-05-23 LAB — BASIC METABOLIC PANEL
BUN: 12 mg/dL (ref 6–23)
Chloride: 103 mEq/L (ref 96–112)
Glucose, Bld: 111 mg/dL — ABNORMAL HIGH (ref 70–99)
Potassium: 3.5 mEq/L (ref 3.5–5.1)

## 2013-05-23 LAB — CBC WITH DIFFERENTIAL/PLATELET
Basophils Relative: 0 % (ref 0–1)
Eosinophils Absolute: 0.1 10*3/uL (ref 0.0–0.7)
Eosinophils Relative: 1 % (ref 0–5)
HCT: 32 % — ABNORMAL LOW (ref 36.0–46.0)
Hemoglobin: 10.2 g/dL — ABNORMAL LOW (ref 12.0–15.0)
MCH: 25.8 pg — ABNORMAL LOW (ref 26.0–34.0)
MCHC: 31.9 g/dL (ref 30.0–36.0)
MCV: 80.8 fL (ref 78.0–100.0)
Monocytes Absolute: 0.9 10*3/uL (ref 0.1–1.0)
Monocytes Relative: 9 % (ref 3–12)

## 2013-05-23 LAB — URINE MICROSCOPIC-ADD ON

## 2013-05-23 NOTE — ED Notes (Signed)
[id number]<AV> Expected date:<BR> Expected time:<BR> Means of arrival:<BR> Comments:<BR> EMS-dizzy

## 2013-05-23 NOTE — ED Notes (Signed)
Patient reports she has intermittent chest tightness since starting Wellbutrin.  Patient was off of her Wellbutrin for 10 days, and restarted it on Friday.  Since, then some days she has continuous chest tightness.

## 2013-05-23 NOTE — ED Notes (Signed)
Patient started with dizziness around 1200 noon today.  Also c/o some chest "tension"  According to EMS.  Patient just broke up with her boyfriend, and in the last week stopped taking Wellbutrin abruptly.  Patient has history of anxiety, and fluid around her ankles for which she takes HCTZ.

## 2013-05-23 NOTE — ED Provider Notes (Signed)
CSN: [id number]     Arrival date & time 05/23/13  1527 History     First MD Initiated Contact with Patient 05/23/13 1536     Chief Complaint  Patient presents with  . Dizziness   (Consider location/radiation/quality/duration/timing/severity/associated sxs/prior Treatment) HPI Comments: 27 yo female with pseudotumor hx, obesity, depression, chronic vision loss presents with chest tightness since starting wellbutrin on Friday.  Time varies.  Non exertional, no diaphoresis.  Minimal sob. Pt feels tension/ stressed.  No blood clot hx.  CHronic leg swelling.  Recently started birth control.  No heart issues.  Nothing worsens. No new ha or new vision changes.    The history is provided by the patient.    Past Medical History  Diagnosis Date  . Headache   . Polycystic ovary   . Pseudotumor cerebri    Past Surgical History  Procedure Laterality Date  . Lumbar puncture     Family History  Problem Relation Age of Onset  . Cancer Other   . Coronary artery disease Other   . Diabetes Other   . Polycystic ovary syndrome Mother    History  Substance Use Topics  . Smoking status: Current Every Day Smoker -- 0.25 packs/day    Types: Cigarettes  . Smokeless tobacco: Not on file  . Alcohol Use: No   OB History   Grav Para Term Preterm Abortions TAB SAB Ect Mult Living                 Review of Systems  Constitutional: Negative for fever and chills.  HENT: Negative for neck pain and neck stiffness.   Eyes: Positive for visual disturbance (chronic).  Respiratory: Positive for shortness of breath.   Cardiovascular: Positive for chest pain and leg swelling (ankle bilataral chronic).  Gastrointestinal: Negative for nausea, vomiting and abdominal pain.  Genitourinary: Negative for dysuria and flank pain.  Musculoskeletal: Negative for back pain.  Skin: Negative for rash.  Neurological: Positive for light-headedness. Negative for headaches.    Allergies  Anti-inflammatory  enzyme  Home Medications   Current Outpatient Rx  Name  Route  Sig  Dispense  Refill  . buPROPion (WELLBUTRIN XL) 300 MG 24 hr tablet   Oral   Take 300 mg by mouth daily.         Marland Kitchen etonogestrel-ethinyl estradiol (NUVARING) 0.12-0.015 MG/24HR vaginal ring   Vaginal   Place 1 each vaginally every 28 (twenty-eight) days. Insert vaginally and leave in place for 3 consecutive weeks, then remove for 1 week.         . hydrochlorothiazide (HYDRODIURIL) 25 MG tablet   Oral   Take 25 mg by mouth daily.         . Multiple Vitamin (MULTIVITAMIN WITH MINERALS) TABS   Oral   Take 1 tablet by mouth daily.          BP 146/81  Pulse 57  Temp(Src) 98.5 F (36.9 C) (Oral)  Resp 20  Wt 375 lb (170.099 kg)  BMI 60.56 kg/m2  SpO2 100% Physical Exam  Nursing note and vitals reviewed. Constitutional: She is oriented to person, place, and time. She appears well-developed and well-nourished.  HENT:  Head: Normocephalic and atraumatic.  Eyes: Conjunctivae are normal. Right eye exhibits no discharge. Left eye exhibits no discharge.  Neck: Normal range of motion. Neck supple. No tracheal deviation present.  Cardiovascular: Normal rate and regular rhythm.   Pulmonary/Chest: Effort normal and breath sounds normal. Respiratory distress: distant due to body habitus.  Abdominal: Soft. She exhibits no distension (obesity). There is no tenderness. There is no guarding.  Musculoskeletal: She exhibits edema (mild feet and ankles bilateral).  Neurological: She is alert and oriented to person, place, and time. No cranial nerve deficit.  Skin: Skin is warm. No rash noted.  Psychiatric: She has a normal mood and affect.    ED Course   Procedures (including critical care time)  Labs Reviewed  PREGNANCY, URINE  URINALYSIS, ROUTINE W REFLEX MICROSCOPIC  BASIC METABOLIC PANEL  CBC WITH DIFFERENTIAL  D-DIMER, QUANTITATIVE   No results found. No diagnosis found.  MDM  Well appearing. No  concern for cardiac at this point, very low risk, ekg unremarkable.  NO cp in ed.  Atypical story and young age.  No headache or blurry vision to worry about increased pressure with her pseudotumor. Pt has fup with neuro.  Low risk wells PE.  D dimer ordered. Sxs resolved in ED.    Date: 05/23/2013  Rate: 62  Rhythm: normal sinus rhythm  QRS Axis: normal  Intervals: normal  ST/T Wave abnormalities: nonspecific ST changes  Conduction Disutrbances:none  Narrative Interpretation:   Old EKG Reviewed: none available  Well appearing on recheck, no sxs. D dimer neg, low risk, no indication for CT at this time.   NOrmal vitals.  Close fup outpt.  DC   Enid Skeens, MD 05/23/13 [phone]

## 2013-05-27 LAB — URINE CULTURE: Colony Count: >=100000

## 2013-05-28 ENCOUNTER — Telehealth (HOSPITAL_COMMUNITY): Payer: Self-pay | Admitting: *Deleted

## 2013-05-28 NOTE — ED Notes (Signed)
Post ED Visit - Positive Culture Follow-up: Successful Patient Follow-Up  Culture assessed and recommendations reviewed by: [x]  Wes Dulaney, Pharm.D., BCPS []  Celedonio Miyamoto, Pharm.D., BCPS []  Georgina Pillion, Pharm.D., BCPS []  [place], [place].D., BCPS, AAHIVP []  Estella Husk, Pharm.D., BCPS, AAHIVP  Positive URINE culture  []  Patient discharged without antimicrobial prescription and treatment is now indicated [x]  Organism is resistant to prescribed ED discharge antimicrobial []  Patient with positive blood cultures  Changes discussed with ED provider: Raymon Mutton New antibiotic prescription Keflex-500 mg po TID x 7 days Larena Sox 05/28/2013, 3:08 PM

## 2013-05-29 NOTE — Progress Notes (Signed)
ED Antimicrobial Stewardship Positive Culture Follow Up   Lindsey [name] is an 27 y.o. female who presented to [facility] on 05/23/2013 with a chief complaint of  Chief Complaint  Patient presents with  . Dizziness    Recent Results (from the past 720 hour(s))  URINE CULTURE     Status: None   Collection Time    05/23/13  4:49 PM      Result Value Range Status   Specimen Description URINE, CLEAN CATCH   Final   Special Requests NONE   Final   Culture  Setup Time 05/24/2013 14:01   Final   Colony Count >=100,000 COLONIES/ML   Final   Culture ESCHERICHIA COLI   Final   Report Status 05/27/2013 FINAL   Final   Organism ID, Bacteria ESCHERICHIA COLI   Final    []  Treated with , organism resistant to prescribed antimicrobial [x]  Patient discharged originally without antimicrobial agent and treatment is now indicated  Recommendation: Perform symptom check. If UTI symptoms (flank pain, dysuria, etc) present, see new prescription below.  New antibiotic prescription: Keflex 500mg  PO TID x 7 days  ED Provider: Raymon Mutton, PA-C   Cleon Dew 05/29/2013, 8:11 AM Infectious Diseases Pharmacist Phone# [phone]

## 2013-05-29 NOTE — ED Notes (Signed)
Patient informed of positive results after id'd x 2

## 2014-04-05 ENCOUNTER — Encounter (HOSPITAL_COMMUNITY): Payer: Self-pay | Admitting: Emergency Medicine

## 2014-04-05 ENCOUNTER — Emergency Department (HOSPITAL_COMMUNITY)
Admission: EM | Admit: 2014-04-05 | Discharge: 2014-04-06 | Disposition: A | Discharge status: Home / Self Care | Payer: 59 | Attending: Emergency Medicine | Admitting: Emergency Medicine

## 2014-04-05 DIAGNOSIS — IMO0002 Reserved for concepts with insufficient information to code with codable children: Secondary | ICD-10-CM | POA: Insufficient documentation

## 2014-04-05 DIAGNOSIS — F17200 Nicotine dependence, unspecified, uncomplicated: Secondary | ICD-10-CM | POA: Insufficient documentation

## 2014-04-05 DIAGNOSIS — Z9109 Other allergy status, other than to drugs and biological substances: Secondary | ICD-10-CM | POA: Insufficient documentation

## 2014-04-05 DIAGNOSIS — E282 Polycystic ovarian syndrome: Secondary | ICD-10-CM | POA: Insufficient documentation

## 2014-04-05 DIAGNOSIS — Z79899 Other long term (current) drug therapy: Secondary | ICD-10-CM | POA: Insufficient documentation

## 2014-04-05 DIAGNOSIS — G932 Benign intracranial hypertension: Secondary | ICD-10-CM | POA: Insufficient documentation

## 2014-04-05 DIAGNOSIS — L0291 Cutaneous abscess, unspecified: Secondary | ICD-10-CM

## 2014-04-05 NOTE — ED Notes (Signed)
Moderated size boil under rt. Armpit. Probably more around the boil.

## 2014-04-06 MED ORDER — OXYCODONE-ACETAMINOPHEN 5-325 MG PO TABS
1.0000 | ORAL_TABLET | Freq: Once | ORAL | Status: AC
  Administered 2014-04-06: 1 via ORAL
  Filled 2014-04-06: qty 1

## 2014-04-06 MED ORDER — OXYCODONE-ACETAMINOPHEN 5-325 MG PO TABS: 2.0000 | ORAL_TABLET | ORAL | Status: DC | PRN

## 2014-04-06 MED ORDER — CLINDAMYCIN HCL 150 MG PO CAPS: 450.0000 mg | ORAL_CAPSULE | Freq: Four times a day (QID) | ORAL | Status: DC

## 2014-04-06 NOTE — ED Provider Notes (Signed)
CSN: [id number]     Arrival date & time 04/05/14  2335 History   First MD Initiated Contact with Patient 04/05/14 2358     Chief Complaint  Patient presents with  . Recurrent Skin Infections     (Consider location/radiation/quality/duration/timing/severity/associated sxs/prior Treatment) Patient is a 28 y.o. female presenting with abscess. The history is provided by the patient. No language interpreter was used.  Abscess Location:  Shoulder/arm Shoulder/arm abscess location:  R axilla Abscess quality: painful and redness   Red streaking: no   Duration:  3 days Progression:  Worsening Pain details:    Quality:  Dull, pressure and sharp   Severity:  Moderate   Duration:  3 days   Timing:  Constant   Progression:  Worsening Chronicity:  Recurrent Context: not diabetes and not immunosuppression   Associated symptoms: no fever     Past Medical History  Diagnosis Date  . Headache(784.0)   . Polycystic ovary   . Pseudotumor cerebri    Past Surgical History  Procedure Laterality Date  . Lumbar puncture     Family History  Problem Relation Age of Onset  . Cancer Other   . Coronary artery disease Other   . Diabetes Other   . Polycystic ovary syndrome Mother    History  Substance Use Topics  . Smoking status: Current Every Day Smoker -- 0.25 packs/day    Types: Cigarettes  . Smokeless tobacco: Not on file  . Alcohol Use: No   OB History   Grav Para Term Preterm Abortions TAB SAB Ect Mult Living                 Review of Systems  Constitutional: Negative for fever and chills.  Skin: Positive for color change and wound.  All other systems reviewed and are negative.     Allergies  Anti-inflammatory enzyme  Home Medications   Prior to Admission medications   Medication Sig Start Date End Date Taking? Authorizing Provider  ALPRAZolam (XANAX) 0.25 MG tablet Take 0.25 mg by mouth 2 (two) times daily as needed for anxiety.   Yes Historical Provider, MD   ARIPiprazole (ABILIFY) 5 MG tablet Take 5 mg by mouth daily.   Yes Historical Provider, MD  citalopram (CELEXA) 10 MG tablet Take 10 mg by mouth daily.   Yes Historical Provider, MD  hydrochlorothiazide (HYDRODIURIL) 25 MG tablet Take 25 mg by mouth daily.   Yes Historical Provider, MD   BP 170/89  Pulse 143  Temp(Src) 99.7 F (37.6 C) (Oral)  Resp 20  Ht 5\' 6"  (1.676 m)  Wt 390 lb (176.903 kg)  BMI 62.98 kg/m2  SpO2 100%  LMP 04/05/2014 Physical Exam  Nursing note and vitals reviewed. Constitutional: She is oriented to person, place, and time. She appears well-developed and well-nourished.  Non-toxic appearance. She does not have a sickly appearance. She does not appear ill. No distress.  Appears uncomfortable. Obese female  HENT:  Head: Normocephalic and atraumatic.  Eyes: EOM are normal. Pupils are equal, round, and reactive to light.  Neck: Normal range of motion. Neck supple.  Pulmonary/Chest: Effort normal. No respiratory distress.  Neurological: She is alert and oriented to person, place, and time.  Skin: Skin is warm and dry. She is not diaphoretic.  Right axilla: Approximately 8x6 raised indurated area with a 4x3 area of central fluctuance and overlying erythema. Tender to palpation. Copious malodors purulent drainage from a circular open area.  Psychiatric: She has a normal mood and affect. Her  behavior is normal.    ED Course  Procedures (including critical care time) Labs Review Labs Reviewed - No data to display  Imaging Review No results found.   EKG Interpretation None      MDM   Final diagnoses:  Abscess   Patient with a large draining abscess to right axilla. No history of diabetes or immunocompromised state. The patient was also evaluated by Dr. Effie ShyWentz due to large size of abscess, and unable to fully evaluate the depth due to body habitus. Will treat with antibiotics and encourage draining the abscess further at home. The patient reports this is a  recurrent issue and general surgery followup was given. Discussed lab results, imaging results, and treatment plan with the patient. Return precautions given. Reports understanding and no other concerns at this time.  Patient is stable for discharge at this time.  Meds given in ED:  Medications  oxyCODONE-acetaminophen (PERCOCET/ROXICET) 5-325 MG per tablet 1 tablet (1 tablet Oral Given 04/06/14 0141)  oxyCODONE-acetaminophen (PERCOCET/ROXICET) 5-325 MG per tablet 1 tablet (1 tablet Oral Given 04/06/14 0228)    Discharge Medication List as of 04/06/2014  2:04 AM    START taking these medications   Details  clindamycin (CLEOCIN) 150 MG capsule Take 3 capsules (450 mg total) by mouth every 6 (six) hours., Starting 04/06/2014, Until Discontinued, Print    oxyCODONE-acetaminophen (PERCOCET/ROXICET) 5-325 MG per tablet Take 2 tablets by mouth every 4 (four) hours as needed for moderate pain or severe pain., Starting 04/06/2014, Until Discontinued, Print            Clabe SealLauren M Bobi Daudelin, PA-C 04/07/14 1540

## 2014-04-06 NOTE — Discharge Instructions (Signed)
Call for a follow up appointment with a Family or Primary Care Provider.  Return if Symptoms worsen.   Take medication as prescribed.  Try and squeeze as much pus/infection out of the arm pit as you can.  You can apply a warm compress to the area as needed. Call a general surgeon for further evaluation of your recurrent abscesses.

## 2014-04-06 NOTE — ED Notes (Signed)
Pt verbalizes understanding of d/c instructions and denies any further needs at this time. 

## 2014-04-06 NOTE — ED Provider Notes (Signed)
  Face-to-face evaluation   History: She complains of a mass in the right axilla. Since arriving to the emergency department, it has begun to drain.  Physical exam: Right axilla, tender mass, approximately 3 x 3 cm with an area in the upper aspect, is draining purulent material, copious. She is moderately tender to palpation. The patient is nontoxic.  Assessment: Draining abscess right axilla without complicating features. The patient is stable for discharge with outpatient management. She will need close followup to ensure that the wound continues to drain and to improve, and will benefit from antibiotic treatment.  Medical screening examination/treatment/procedure(s) were conducted as a shared visit with non-physician practitioner(s) and myself.  I personally evaluated the patient during the encounter  Flint MelterElliott L Kerigan Narvaez, MD 04/06/14 [phone]

## 2014-04-08 NOTE — ED Provider Notes (Signed)
Medical screening examination/treatment/procedure(s) were performed by non-physician practitioner and as supervising physician I was immediately available for consultation/collaboration.   EKG Interpretation None       Flint MelterElliott L Rosamond Andress, MD 04/08/14 1234

## 2015-02-22 DIAGNOSIS — F32A Depression, unspecified: Secondary | ICD-10-CM

## 2015-02-22 HISTORY — DX: Depression, unspecified: F32.A

## 2015-03-29 ENCOUNTER — Encounter (HOSPITAL_COMMUNITY): Payer: Self-pay | Admitting: *Deleted

## 2015-03-29 ENCOUNTER — Emergency Department (HOSPITAL_COMMUNITY)
Admission: EM | Admit: 2015-03-29 | Discharge: 2015-03-29 | Disposition: A | Discharge status: Home / Self Care | Payer: 59 | Attending: Emergency Medicine | Admitting: Emergency Medicine

## 2015-03-29 ENCOUNTER — Emergency Department (HOSPITAL_COMMUNITY): Payer: 59

## 2015-03-29 DIAGNOSIS — R51 Headache: Secondary | ICD-10-CM | POA: Diagnosis present

## 2015-03-29 DIAGNOSIS — Z8669 Personal history of other diseases of the nervous system and sense organs: Secondary | ICD-10-CM | POA: Insufficient documentation

## 2015-03-29 DIAGNOSIS — M545 Low back pain: Secondary | ICD-10-CM | POA: Insufficient documentation

## 2015-03-29 DIAGNOSIS — Z7982 Long term (current) use of aspirin: Secondary | ICD-10-CM | POA: Diagnosis not present

## 2015-03-29 DIAGNOSIS — R0600 Dyspnea, unspecified: Secondary | ICD-10-CM

## 2015-03-29 DIAGNOSIS — Z720 Tobacco use: Secondary | ICD-10-CM | POA: Diagnosis not present

## 2015-03-29 DIAGNOSIS — Z79899 Other long term (current) drug therapy: Secondary | ICD-10-CM | POA: Insufficient documentation

## 2015-03-29 DIAGNOSIS — R519 Headache, unspecified: Secondary | ICD-10-CM

## 2015-03-29 DIAGNOSIS — Z3202 Encounter for pregnancy test, result negative: Secondary | ICD-10-CM | POA: Diagnosis not present

## 2015-03-29 LAB — CBC WITH DIFFERENTIAL/PLATELET
BASOS ABS: 0 10*3/uL (ref 0.0–0.1)
Basophils Relative: 0 % (ref 0–1)
EOS PCT: 0 % (ref 0–5)
Eosinophils Absolute: 0 10*3/uL (ref 0.0–0.7)
HCT: 34.3 % — ABNORMAL LOW (ref 36.0–46.0)
Hemoglobin: 10.9 g/dL — ABNORMAL LOW (ref 12.0–15.0)
Lymphocytes Relative: 20 % (ref 12–46)
Lymphs Abs: 2.8 10*3/uL (ref 0.7–4.0)
MCH: 26 pg (ref 26.0–34.0)
MCHC: 31.8 g/dL (ref 30.0–36.0)
MCV: 81.7 fL (ref 78.0–100.0)
Monocytes Absolute: 0.7 10*3/uL (ref 0.1–1.0)
Monocytes Relative: 5 % (ref 3–12)
Neutro Abs: 10.4 10*3/uL — ABNORMAL HIGH (ref 1.7–7.7)
Neutrophils Relative %: 75 % (ref 43–77)
Platelets: 399 10*3/uL (ref 150–400)
RBC: 4.2 MIL/uL (ref 3.87–5.11)
RDW: 16.8 % — AB (ref 11.5–15.5)
WBC: 13.9 10*3/uL — ABNORMAL HIGH (ref 4.0–10.5)

## 2015-03-29 LAB — URINALYSIS, ROUTINE W REFLEX MICROSCOPIC
Bilirubin Urine: NEGATIVE
Glucose, UA: NEGATIVE mg/dL
KETONES UR: NEGATIVE mg/dL
Nitrite: NEGATIVE
PROTEIN: NEGATIVE mg/dL
SPECIFIC GRAVITY, URINE: 1.022 (ref 1.005–1.030)
UROBILINOGEN UA: 0.2 mg/dL (ref 0.0–1.0)
pH: 6.5 (ref 5.0–8.0)

## 2015-03-29 LAB — BASIC METABOLIC PANEL
Anion gap: 11 (ref 5–15)
BUN: 13 mg/dL (ref 6–20)
CO2: 25 mmol/L (ref 22–32)
Calcium: 9.2 mg/dL (ref 8.9–10.3)
Chloride: 100 mmol/L — ABNORMAL LOW (ref 101–111)
Creatinine, Ser: 0.88 mg/dL (ref 0.44–1.00)
GFR calc non Af Amer: >60 mL/min (ref >60)
Glucose, Bld: 162 mg/dL — ABNORMAL HIGH (ref 65–99)
Potassium: 3.7 mmol/L (ref 3.5–5.1)
SODIUM: 136 mmol/L (ref 135–145)

## 2015-03-29 LAB — POC URINE PREG, ED: Preg Test, Ur: NEGATIVE

## 2015-03-29 LAB — URINE MICROSCOPIC-ADD ON

## 2015-03-29 MED ORDER — OXYCODONE-ACETAMINOPHEN 5-325 MG PO TABS: 1.0000 | ORAL_TABLET | Freq: Four times a day (QID) | ORAL | Status: DC | PRN

## 2015-03-29 MED ORDER — HYDROCODONE-ACETAMINOPHEN 5-325 MG PO TABS
2.0000 | ORAL_TABLET | Freq: Once | ORAL | Status: AC
  Administered 2015-03-29: 2 via ORAL
  Filled 2015-03-29: qty 2

## 2015-03-29 MED ORDER — KETOROLAC TROMETHAMINE 60 MG/2ML IM SOLN
60.0000 mg | Freq: Once | INTRAMUSCULAR | Status: DC
  Filled 2015-03-29: qty 2

## 2015-03-29 MED ORDER — HYDROMORPHONE HCL 2 MG/ML IJ SOLN
2.0000 mg | Freq: Once | INTRAMUSCULAR | Status: AC
  Administered 2015-03-29: 2 mg via INTRAMUSCULAR
  Filled 2015-03-29: qty 1

## 2015-03-29 MED ORDER — ALBUTEROL SULFATE (2.5 MG/3ML) 0.083% IN NEBU
5.0000 mg | INHALATION_SOLUTION | Freq: Once | RESPIRATORY_TRACT | Status: AC
  Administered 2015-03-29: 5 mg via RESPIRATORY_TRACT
  Filled 2015-03-29: qty 6

## 2015-03-29 NOTE — ED Provider Notes (Signed)
CSN: [id number]     Arrival date & time 03/29/15  1530 History   First MD Initiated Contact with Patient 03/29/15 1823     Chief Complaint  Patient presents with  . Shortness of Breath     (Consider location/radiation/quality/duration/timing/severity/associated sxs/prior Treatment) HPI Comments: Patient is a 29 year old female with history of morbid obesity, polycystic ovaries, pseudotumor cerebri. She presents today for evaluation of headache, low back pain, feeling short of breath, and generalized malaise. This has been worsening over the past 2 weeks. She denies any fevers or chills. She denies any urinary complaints. She reports to me she has had lumbar punctures in the past for relief of her headaches.  Patient is a 29 y.o. female presenting with shortness of breath. The history is provided by the patient.  Shortness of Breath Severity:  Moderate Onset quality:  Gradual Duration:  2 weeks Timing:  Constant Progression:  Worsening Chronicity:  New Context: activity   Relieved by:  Nothing Worsened by:  Nothing tried Ineffective treatments:  None tried Associated symptoms: no chest pain, no cough and no fever     Past Medical History  Diagnosis Date  . Headache(784.0)   . Polycystic ovary   . Pseudotumor cerebri    Past Surgical History  Procedure Laterality Date  . Lumbar puncture     Family History  Problem Relation Age of Onset  . Cancer Other   . Coronary artery disease Other   . Diabetes Other   . Polycystic ovary syndrome Mother    History  Substance Use Topics  . Smoking status: Current Every Day Smoker -- 0.25 packs/day    Types: Cigarettes  . Smokeless tobacco: Not on file  . Alcohol Use: No   OB History    No data available     Review of Systems  Constitutional: Negative for fever.  Respiratory: Positive for shortness of breath. Negative for cough.   Cardiovascular: Negative for chest pain.  All other systems reviewed and are  negative.     Allergies  Anti-inflammatory enzyme  Home Medications   Prior to Admission medications   Medication Sig Start Date End Date Taking? Authorizing Provider  ASPIRIN PO Take 3 tablets by mouth daily as needed (back pain and headache).   Yes Historical Provider, MD  buPROPion (WELLBUTRIN XL) 300 MG 24 hr tablet Take 300 mg by mouth daily.   Yes Historical Provider, MD  citalopram (CELEXA) 10 MG tablet Take 10 mg by mouth daily.   Yes Historical Provider, MD  hydrochlorothiazide (HYDRODIURIL) 25 MG tablet Take 25 mg by mouth daily.   Yes Historical Provider, MD  clindamycin (CLEOCIN) 150 MG capsule Take 3 capsules (450 mg total) by mouth every 6 (six) hours. Patient not taking: Reported on 03/29/2015 04/06/14   Mellody DrownLauren Parker, PA-C  oxyCODONE-acetaminophen (PERCOCET/ROXICET) 5-325 MG per tablet Take 2 tablets by mouth every 4 (four) hours as needed for moderate pain or severe pain. Patient not taking: Reported on 03/29/2015 04/06/14   Mellody DrownLauren Parker, PA-C   BP 157/80 mmHg  Pulse 91  Temp(Src) 97.8 F (36.6 C) (Oral)  Resp 30  Ht 5\' 4"  (1.626 m)  Wt 400 lb (181.439 kg)  BMI 68.63 kg/m2  SpO2 100%  LMP 01/23/2015 Physical Exam  Constitutional: She is oriented to person, place, and time. She appears well-developed and well-nourished. No distress.  HENT:  Head: Normocephalic and atraumatic.  Eyes: EOM are normal. Pupils are equal, round, and reactive to light.  There is no papilledema  on funduscopic exam  Neck: Normal range of motion. Neck supple.  Cardiovascular: Normal rate and regular rhythm.  Exam reveals no gallop and no friction rub.   No murmur heard. Pulmonary/Chest: Effort normal and breath sounds normal. No respiratory distress. She has no wheezes.  Abdominal: Soft. Bowel sounds are normal. She exhibits no distension. There is no tenderness.  Musculoskeletal: Normal range of motion. She exhibits no edema.  Neurological: She is alert and oriented to person, place, and  time. No cranial nerve deficit. She exhibits normal muscle tone. Coordination normal.  Skin: Skin is warm and dry. She is not diaphoretic.  Nursing note and vitals reviewed.   ED Course  Procedures (including critical care time) Labs Review Labs Reviewed  URINALYSIS, ROUTINE W REFLEX MICROSCOPIC (NOT AT [facility])  BASIC METABOLIC PANEL  CBC WITH DIFFERENTIAL/PLATELET  POC URINE PREG, ED    Imaging Review Dg Chest 2 View  03/29/2015   CLINICAL DATA:  Shortness of breath and low back pain for 2 weeks.  EXAM: CHEST  2 VIEW  COMPARISON:  05/23/2013  FINDINGS: The heart size and mediastinal contours are within normal limits. Both lungs are clear. The visualized skeletal structures are unremarkable.  IMPRESSION: Normal chest x-ray.   Electronically Signed   By: Rudie Meyer M.D.   On: 03/29/2015 17:16     EKG Interpretation   Date/Time:  Sunday March 29 2015 15:51:39 EDT Ventricular Rate:  89 PR Interval:  144 QRS Duration: 79 QT Interval:  326 QTC Calculation: 397 R Axis:   18 Text Interpretation:  Sinus rhythm RSR' in V1 or V2, probably normal  variant Borderline T abnormalities, anterior leads Confirmed by Hubert Derstine  MD,  Yutaka Holberg ([id number]) on 03/29/2015 3:57:02 PM      MDM   Final diagnoses:  None    Patient is a 29 year old female with morbid obesity who presents with headache, back pain, and feeling short of breath. Her oxygen saturations are adequate, lungs are clear, and there is no tachypnea. Her workup reveals a mildly elevated white count, but is otherwise unremarkable. She has been diagnosed with pseudotumor cerebri in the past and I will encourage her to follow-up with her neurologist to discuss therapeutic lumbar puncture. I see nothing today that is emergent. She was given 2 Vicodin for her discomfort, however she reports this has not helped. She became angry with her nurse when he would not give her additional pain medication. She will be discharged to home with follow-up as  above.    Geoffery Lyons, MD 03/29/15 2018

## 2015-03-29 NOTE — ED Notes (Signed)
Patient made aware of needed urine sample. Patient currently getting breathing treatment.

## 2015-03-29 NOTE — ED Notes (Signed)
Pt presents to ED with c/o SOB and lower back pain x 2 weeks

## 2015-03-29 NOTE — Discharge Instructions (Signed)
Percocet as prescribed as needed for pain.  Follow-up with your neurologist to discuss therapeutic lumbar puncture.   Back Pain, Adult Low back pain is very common. About 1 in 5 people have back pain.The cause of low back pain is rarely dangerous. The pain often gets better over time.About half of people with a sudden onset of back pain feel better in just 2 weeks. About 8 in 10 people feel better by 6 weeks.  CAUSES Some common causes of back pain include:  Strain of the muscles or ligaments supporting the spine.  Wear and tear (degeneration) of the spinal discs.  Arthritis.  Direct injury to the back. DIAGNOSIS Most of the time, the direct cause of low back pain is not known.However, back pain can be treated effectively even when the exact cause of the pain is unknown.Answering your caregiver's questions about your overall health and symptoms is one of the most accurate ways to make sure the cause of your pain is not dangerous. If your caregiver needs more information, he or she may order lab work or imaging tests (X-rays or MRIs).However, even if imaging tests show changes in your back, this usually does not require surgery. HOME CARE INSTRUCTIONS For many people, back pain returns.Since low back pain is rarely dangerous, it is often a condition that people can learn to [facility] their own.   Remain active. It is stressful on the back to sit or stand in one place. Do not sit, drive, or stand in one place for more than 30 minutes at a time. Take short walks on level surfaces as soon as pain allows.Try to increase the length of time you walk each day.  Do not stay in bed.Resting more than 1 or 2 days can delay your recovery.  Do not avoid exercise or work.Your body is made to move.It is not dangerous to be active, even though your back may hurt.Your back will likely heal faster if you return to being active before your pain is gone.  Pay attention to your body when you bend  and lift. Many people have less discomfortwhen lifting if they bend their knees, keep the load close to their bodies,and avoid twisting. Often, the most comfortable positions are those that put less stress on your recovering back.  Find a comfortable position to sleep. Use a firm mattress and lie on your side with your knees slightly bent. If you lie on your back, put a pillow under your knees.  Only take over-the-counter or prescription medicines as directed by your caregiver. Over-the-counter medicines to reduce pain and inflammation are often the most helpful.Your caregiver may prescribe muscle relaxant drugs.These medicines help dull your pain so you can more quickly return to your normal activities and healthy exercise.  Put ice on the injured area.  Put ice in a plastic bag.  Place a towel between your skin and the bag.  Leave the ice on for 15-20 minutes, 03-04 times a day for the first 2 to 3 days. After that, ice and heat may be alternated to reduce pain and spasms.  Ask your caregiver about trying back exercises and gentle massage. This may be of some benefit.  Avoid feeling anxious or stressed.Stress increases muscle tension and can worsen back pain.It is important to recognize when you are anxious or stressed and learn ways to manage it.Exercise is a great option. SEEK MEDICAL CARE IF:  You have pain that is not relieved with rest or medicine.  You have pain that  does not improve in 1 week.  You have new symptoms.  You are generally not feeling well. SEEK IMMEDIATE MEDICAL CARE IF:   You have pain that radiates from your back into your legs.  You develop new bowel or bladder control problems.  You have unusual weakness or numbness in your arms or legs.  You develop nausea or vomiting.  You develop abdominal pain.  You feel faint. Document Released: 10/10/2005 Document Revised: 04/10/2012 Document Reviewed: 02/11/2014 [facility] Patient Information 2015  [place], [place]. This information is not intended to replace advice given to you by your health care provider. Make sure you discuss any questions you have with your health care provider.  General Headache Without Cause A general headache is pain or discomfort felt around the head or neck area. The cause may not be found.  HOME CARE   Keep all doctor visits.  Only take medicines as told by your doctor.  Lie down in a dark, quiet room when you have a headache.  Keep a journal to find out if certain things bring on headaches. For example, write down:  What you eat and drink.  How much sleep you get.  Any change to your diet or medicines.  Relax by getting a massage or doing other relaxing activities.  Put ice or heat packs on the head and neck area as told by your doctor.  Lessen stress.  Sit up straight. Do not tighten (tense) your muscles.  Quit smoking if you smoke.  Lessen how much alcohol you drink.  Lessen how much caffeine you drink, or stop drinking caffeine.  Eat and sleep on a regular schedule.  Get 7 to 9 hours of sleep, or as told by your doctor.  Keep lights dim if bright lights bother you or make your headaches worse. GET HELP RIGHT AWAY IF:   Your headache becomes really bad.  You have a fever.  You have a stiff neck.  You have trouble seeing.  Your muscles are weak, or you lose muscle control.  You lose your balance or have trouble walking.  You feel like you will pass out (faint), or you pass out.  You have really bad symptoms that are different than your first symptoms.  You have problems with the medicines given to you by your doctor.  Your medicines do not work.  Your headache feels different than the other headaches.  You feel sick to your stomach (nauseous) or throw up (vomit). MAKE SURE YOU:   Understand these instructions.  Will watch your condition.  Will get help right away if you are not doing well or get worse. Document  Released: 07/19/2008 Document Revised: 01/02/2012 Document Reviewed: 09/30/2011 [facility] Patient Information 2015 [place], [place]. This information is not intended to replace advice given to you by your health care provider. Make sure you discuss any questions you have with your health care provider.

## 2015-06-02 ENCOUNTER — Ambulatory Visit (INDEPENDENT_AMBULATORY_CARE_PROVIDER_SITE_OTHER): Payer: 59 | Admitting: Neurology

## 2015-06-02 ENCOUNTER — Encounter: Payer: Self-pay | Admitting: Neurology

## 2015-06-02 VITALS — BP 140/78 | HR 80 | Resp 22 | Ht 67.0 in | Wt >= 6400 oz

## 2015-06-02 DIAGNOSIS — G932 Benign intracranial hypertension: Secondary | ICD-10-CM | POA: Insufficient documentation

## 2015-06-02 MED ORDER — ACETAZOLAMIDE ER 500 MG PO CP12: 500.0000 mg | ORAL_CAPSULE | Freq: Two times a day (BID) | ORAL | Status: DC

## 2015-06-02 NOTE — Patient Instructions (Addendum)
1.  Start acetazolamide ER  twice daily 2.  Refer to ophthalmologist : [address] [place], [place] [address]  Phone: [phone]  Dr Cathey Endow 06/12/15 12:45pm  3.  Weight loss 4.  Low sodium diet 5.  Avoid vitamin A 6.  Follow up in 4 to 6 weeks.

## 2015-06-02 NOTE — Progress Notes (Signed)
NEUROLOGY CONSULTATION NOTE  Lindsey [name] MRN: [id number] DOB: 1985-11-17  Referring provider: Dr. Tenny Craw Primary care provider: Dr. Tenny Craw  Reason for consult:  Idiopathic intracranial hypertension  HISTORY OF PRESENT ILLNESS: Lindsey [name] is a 29 year old right-handed female with morbid obesity, major depression, idiopathic intracranial hypertension, and polycystic ovaries who presents for idiopathic intracranial hypertension.  ED note and PCP note from June, CXR and labs reviewed.  She was diagnosed with IIH in 2012 after presenting with headache.  Ophthalmologic evaluation revealed papilledema.  CT of the head from 09/03/11 was normal.  She was sent to [facility] on 09/09/11 where she underwent a lumbar puncture which revealed an opening pressure of 60 and closing pressure of 21, with resolution of headache.  She was prescribed Diamox but never started it.  Over the years, she continues to have residual peripheral vision loss.  She has not had her eyes checked in a while.  In May, she started to feel chills associated with severe bi-frontal headache and increased blurred vision.  She went to the ED on 03/29/15.  Labs showed mildly elevated WBC of 13.9.  UA showed small leukocytes but negative for nitrites.  CXR negative.  Na was 136 and K 3.7.  She was given Vicodin and discharged.  Since June, the bifrontal headaches and blurred vision resolved, but she has a constant occipital headache radiating into the neck, worse when laying down.  Occasionally, she notes a severe pulling sensation in the back of her head.  She notes worsening peripheral vision loss.  She also endorses visual obscurations and pulsatile tinnitus.   Occasionally, she notes nausea.  She reports the headaches are worse this time.  Last week, she says she felt lightheaded when she got out of the shower.  She sat down and passed out briefly.  She hit her head.    She takes HCTZ for lower extremity edema, which she has  not been taking.  She also takes gabapentin 100mg  twice daily, Celexa 20mg  daily and metformin.  She recently started a multivitamin and B complex.  She reports prior history of headaches associated with birth control medication.  PAST MEDICAL HISTORY: Past Medical History  Diagnosis Date  . Headache(784.0)   . Polycystic ovary   . Pseudotumor cerebri     PAST SURGICAL HISTORY: Past Surgical History  Procedure Laterality Date  . Lumbar puncture      MEDICATIONS: Current Outpatient Prescriptions on File Prior to Visit  Medication Sig Dispense Refill  . citalopram (CELEXA) 10 MG tablet Take 10 mg by mouth daily.    . ASPIRIN PO Take 3 tablets by mouth daily as needed (back pain and headache).    Marland Kitchen buPROPion (WELLBUTRIN XL) 300 MG 24 hr tablet Take 300 mg by mouth daily.    . clindamycin (CLEOCIN) 150 MG capsule Take 3 capsules (450 mg total) by mouth every 6 (six) hours. (Patient not taking: Reported on 06/02/2015) 84 capsule 0  . oxyCODONE-acetaminophen (PERCOCET) 5-325 MG per tablet Take 1-2 tablets by mouth every 6 (six) hours as needed. (Patient not taking: Reported on 06/02/2015) 10 tablet 0   No current facility-administered medications on file prior to visit.    ALLERGIES: Allergies  Allergen Reactions  . Anti-Inflammatory Enzyme [Nutritional Supplements] Swelling    And fluid on the brain    FAMILY HISTORY: Family History  Problem Relation Age of Onset  . Cancer Other   . Coronary artery disease Other   . Diabetes Other   .  Polycystic ovary syndrome Mother   . Coronary artery disease Mother   . Narcolepsy Maternal Grandmother   . Heart failure Maternal Grandfather     SOCIAL HISTORY: History   Social History  . Marital Status: Single    Spouse Name: N/A  . Number of Children: N/A  . Years of Education: N/A   Occupational History  . Not on file.   Social History Main Topics  . Smoking status: Current Every Day Smoker -- 0.25 packs/day    Types:  Cigarettes  . Smokeless tobacco: Not on file  . Alcohol Use: No  . Drug Use: Yes    Special: Marijuana     Comment: quit  . Sexual Activity:    Partners: Male   Other Topics Concern  . Not on file   Social History Narrative    REVIEW OF SYSTEMS: Constitutional: No fevers, chills, or sweats, no generalized fatigue, change in appetite Eyes: No visual changes, double vision, eye pain Ear, nose and throat: No hearing loss, ear pain, nasal congestion, sore throat Cardiovascular: No chest pain, palpitations Respiratory:  No shortness of breath at rest or with exertion, wheezes GastrointestinaI: No nausea, vomiting, diarrhea, abdominal pain, fecal incontinence Genitourinary:  No dysuria, urinary retention or frequency Musculoskeletal:  No neck pain, back pain Integumentary: No rash, pruritus, skin lesions Neurological: as above Psychiatric: No depression, insomnia, anxiety Endocrine: No palpitations, fatigue, diaphoresis, mood swings, change in appetite, change in weight, increased thirst Hematologic/Lymphatic:  No anemia, purpura, petechiae. Allergic/Immunologic: no itchy/runny eyes, nasal congestion, recent allergic reactions, rashes  PHYSICAL EXAM: Filed Vitals:   06/02/15 0806  BP: 140/78  Pulse: 80  Resp: 22   General: No acute distress.  Patient appears well-groomed.  Morbidly obese body habitus. Head:  Normocephalic/atraumatic Eyes:  fundi not visualized on inspection Neck: supple, no paraspinal tenderness, full range of motion Back: No paraspinal tenderness Heart: regular rate and rhythm Lungs: Clear to auscultation bilaterally. Vascular: No carotid bruits. Neurological Exam: Mental status: alert and oriented to person, place, and time, recent and remote memory intact, fund of knowledge intact, attention and concentration intact, speech fluent and not dysarthric, language intact. Cranial nerves: CN I: not tested CN II: pupils equal, round and reactive to light,  peripheral vision loss in right eye CN III, IV, VI:  full range of motion, no nystagmus, no ptosis CN V: facial sensation intact CN VII: upper and lower face symmetric CN VIII: hearing intact CN IX, X: gag intact, uvula midline CN XI: sternocleidomastoid and trapezius muscles intact CN XII: tongue midline Bulk & Tone: normal, no fasciculations. Motor:  5/5 throughout Sensation:  Temperature and vibration intact Deep Tendon Reflexes:  Absent.  Toes downgoing. Finger to nose testing:  intact Heel to shin:  intact Gait:  Normal station and stride.  Able to turn and tandem walk. Romberg negative.  IMPRESSION: Idiopathic intracranial hypertension.   Morbid obesity  PLAN: 1.  Start acetazolamide  twice daily.  I would discontinue HCTZ, since she doesn't really need to be on two diuretics. 2.  Refer to ophthalmology for baseline exam 3.  Weight loss 4.  Low sodium diet 5.  Avoid vitamin A. 6.  Follow up in 4 to 6 weeks  Thank you for allowing me to take part in the care of this patient.  Shon Millet, DO  CC:  C. Duane Lope, MD

## 2015-07-01 ENCOUNTER — Telehealth: Payer: Self-pay

## 2015-07-01 NOTE — Telephone Encounter (Signed)
We received a fax from [facility] that patient no showed to her appointment on 06/30/2015. I called today and left a message for [name] to [facility], because Dr. Everlena Cooper would really like for her to follow up with Opthalmology. Thanks

## 2015-07-02 ENCOUNTER — Telehealth: Payer: Self-pay | Admitting: Neurology

## 2015-07-02 NOTE — Telephone Encounter (Signed)
VM-Pt returned your call/Dawn CB# [phone]

## 2015-07-02 NOTE — Telephone Encounter (Signed)
LM for patient to cb. MAH 

## 2015-07-03 NOTE — Telephone Encounter (Signed)
Spoke with pt. Pt. States that she is going to try to reschedule her appt soon.

## 2015-07-14 ENCOUNTER — Ambulatory Visit: Payer: 59 | Admitting: Neurology

## 2015-09-10 ENCOUNTER — Ambulatory Visit: Payer: 59 | Admitting: Neurology

## 2016-02-09 DIAGNOSIS — K8020 Calculus of gallbladder without cholecystitis without obstruction: Secondary | ICD-10-CM | POA: Insufficient documentation

## 2016-07-02 ENCOUNTER — Emergency Department (HOSPITAL_COMMUNITY)
Admission: EM | Admit: 2016-07-02 | Discharge: 2016-07-03 | Disposition: A | Discharge status: Home / Self Care | Payer: 59 | Attending: Emergency Medicine | Admitting: Emergency Medicine

## 2016-07-02 ENCOUNTER — Emergency Department (HOSPITAL_COMMUNITY): Payer: 59

## 2016-07-02 ENCOUNTER — Encounter (HOSPITAL_COMMUNITY): Payer: Self-pay

## 2016-07-02 DIAGNOSIS — F1290 Cannabis use, unspecified, uncomplicated: Secondary | ICD-10-CM | POA: Diagnosis not present

## 2016-07-02 DIAGNOSIS — F17210 Nicotine dependence, cigarettes, uncomplicated: Secondary | ICD-10-CM | POA: Insufficient documentation

## 2016-07-02 DIAGNOSIS — R51 Headache: Secondary | ICD-10-CM

## 2016-07-02 DIAGNOSIS — Z5181 Encounter for therapeutic drug level monitoring: Secondary | ICD-10-CM | POA: Diagnosis not present

## 2016-07-02 DIAGNOSIS — Z79899 Other long term (current) drug therapy: Secondary | ICD-10-CM | POA: Diagnosis not present

## 2016-07-02 DIAGNOSIS — G932 Benign intracranial hypertension: Secondary | ICD-10-CM | POA: Diagnosis not present

## 2016-07-02 DIAGNOSIS — R519 Headache, unspecified: Secondary | ICD-10-CM

## 2016-07-02 MED ORDER — LIDOCAINE HCL 2 % IJ SOLN
20.0000 mL | Freq: Once | INTRAMUSCULAR | Status: AC
  Administered 2016-07-03: 400 mg
  Filled 2016-07-02: qty 20

## 2016-07-02 NOTE — ED Provider Notes (Signed)
[facility]-EMERGENCY DEPT Provider Note   CSN: [id number] Arrival date & time: 07/02/16  2221 By signing my name below, I, [name], attest that this documentation has been prepared under the direction and in the presence of Lindsey Crease, MD. Electronically Signed: [name], ED Scribe. 07/02/16. 11:15 PM.  History   Chief Complaint Chief Complaint  Patient presents with  . Headache  . Back Pain  . Eye Pain   HPI Comments: Lindsey [name] is a 30 y.o. female who presents to the Emergency Department complaining of sudden onset, constant, 10/10 headache onset tonight. Pt also has associated blurry vision, pain behind her eyes, generalized weakness, ear congestion, and back pain. No alleviating factors noted. Pt states that she has h/o psuedo tumor cerebri and has taken her prescribed medication for the fluid in her head for the past 4 months. Pt denies numbness and paresthesia.   The history is provided by the patient. No language interpreter was used.    Past Medical History:  Diagnosis Date  . Headache(784.0)   . Polycystic ovary   . Pseudotumor cerebri     Patient Active Problem List   Diagnosis Date Noted  . Idiopathic intracranial hypertension 06/02/2015  . Pseudotumor cerebri 09/09/2011  . Obesity, morbid (HCC) 09/09/2011  . Vision disturbance 09/09/2011    Past Surgical History:  Procedure Laterality Date  . LUMBAR PUNCTURE      OB History    No data available       Home Medications    Prior to Admission medications   Medication Sig Start Date End Date Taking? Authorizing Provider  acetaminophen (TYLENOL) 500 MG tablet Take 2,000 mg by mouth every 6 (six) hours as needed (for pain).   Yes Historical Provider, MD  acetaZOLAMIDE (DIAMOX) 500 MG capsule Take 1 capsule (500 mg total) by mouth 2 (two) times daily. 06/02/15  Yes Drema Dallas, DO  citalopram (CELEXA) 10 MG tablet Take 10 mg by mouth daily.    Historical Provider, MD  gabapentin (NEURONTIN) 300 MG  capsule Take 300 mg by mouth 3 (three) times daily.    Historical Provider, MD    Family History Family History  Problem Relation Age of Onset  . Cancer Other   . Coronary artery disease Other   . Diabetes Other   . Polycystic ovary syndrome Mother   . Coronary artery disease Mother   . Narcolepsy Maternal Grandmother   . Heart failure Maternal Grandfather     Social History Social History  Substance Use Topics  . Smoking status: Current Every Day Smoker    Packs/day: 0.25    Types: Cigarettes  . Smokeless tobacco: Never Used  . Alcohol use No     Allergies   Anti-inflammatory enzyme [nutritional supplements]   Review of Systems Review of Systems  Constitutional: Negative for fever.  HENT: Positive for congestion.   Eyes: Positive for pain and visual disturbance.  Musculoskeletal: Positive for back pain.  Neurological: Positive for weakness and headaches. Negative for numbness.  All other systems reviewed and are negative.  Physical Exam Updated Vital Signs BP 151/81   Pulse 84   Temp 97.6 F (36.4 C) (Oral)   Resp 20   Ht 5\' 6"  (1.676 m)   Wt (!) 430 lb (195 kg)   LMP 06/11/2016 (Approximate) Comment: irregular  SpO2 95%   BMI 69.40 kg/m   Physical Exam  Constitutional: She is oriented to person, place, and time. She appears well-developed and well-nourished. No distress.  HENT:  Head: Normocephalic and atraumatic.  Right Ear: Hearing normal.  Left Ear: Hearing normal.  Nose: Nose normal.  Mouth/Throat: Oropharynx is clear and moist and mucous membranes are normal.  Eyes: Conjunctivae and EOM are normal. Pupils are equal, round, and reactive to light.  Neck: Normal range of motion. Neck supple.  Cardiovascular: Regular rhythm, S1 normal and S2 normal.  Exam reveals no gallop and no friction rub.   No murmur heard. Pulmonary/Chest: Effort normal and breath sounds normal. No respiratory distress. She exhibits no tenderness.  Abdominal: Soft. Normal  appearance and bowel sounds are normal. There is no hepatosplenomegaly. There is no tenderness. There is no rebound, no guarding, no tenderness at McBurney's point and negative Murphy's sign. No hernia.  Musculoskeletal: Normal range of motion.  Neurological: She is alert and oriented to person, place, and time. She has normal strength. No cranial nerve deficit or sensory deficit. Coordination normal. GCS eye subscore is 4. GCS verbal subscore is 5. GCS motor subscore is 6.  Skin: Skin is warm, dry and intact. No rash noted. No cyanosis.  Psychiatric: She has a normal mood and affect. Her speech is normal and behavior is normal. Thought content normal.  Nursing note and vitals reviewed.    ED Treatments / Results  DIAGNOSTIC STUDIES: Oxygen Saturation is 98% on RA, normal by my interpretation.    COORDINATION OF CARE: 11:06 PM Discussed treatment plan with pt at bedside which includes head CT and pt agreed to plan.  Labs (all labs ordered are listed, but only abnormal results are displayed) Labs Reviewed  CBC WITH DIFFERENTIAL/PLATELET - Abnormal; Notable for the following:       Result Value   WBC 11.6 (*)    RDW 15.6 (*)    Neutro Abs 9.1 (*)    All other components within normal limits  BASIC METABOLIC PANEL - Abnormal; Notable for the following:    Sodium 134 (*)    Glucose, Bld 319 (*)    Creatinine, Ser 1.06 (*)    All other components within normal limits  PROTIME-INR    EKG  EKG Interpretation None       Radiology Ct Head Wo Contrast  Result Date: 07/02/2016 CLINICAL DATA:  Acute onset of severe headache and pain behind the eyes. Blurred vision. Initial encounter. EXAM: CT HEAD WITHOUT CONTRAST TECHNIQUE: Contiguous axial images were obtained from the base of the skull through the vertex without intravenous contrast. COMPARISON:  CT of the head performed 09/03/2011 FINDINGS: Brain: No evidence of acute infarction, hemorrhage, hydrocephalus, extra-axial collection or  mass lesion/mass effect. The posterior fossa, including the cerebellum, brainstem and fourth ventricle, is within normal limits. The third and lateral ventricles, and basal ganglia are unremarkable in appearance. The cerebral hemispheres are symmetric in appearance, with normal gray-white differentiation. No mass effect or midline shift is seen. Vascular: No hyperdense vessel or unexpected calcification. Skull: There is no evidence of fracture; visualized osseous structures are unremarkable in appearance. Sinuses/Orbits: The visualized portions of the orbits are within normal limits. The paranasal sinuses and mastoid air cells are well-aerated. Other: No significant soft tissue abnormalities are seen. IMPRESSION: Unremarkable noncontrast CT of the head. Electronically Signed   By: Roanna RaiderJeffery  Chang M.D.   On: 07/02/2016 23:52    Procedures .Lumbar Puncture Date/Time: 07/03/2016 2:40 AM Performed by: Lindsey CreasePOLLINA, Jobie Popp J Authorized by: Lindsey CreasePOLLINA, Kharisma Glasner J   Consent:    Consent obtained:  Written   Consent given by:  Patient   Risks  discussed:  Bleeding, infection, pain, nerve damage and headache   Alternatives discussed:  Delayed treatment Pre-procedure details:    Procedure purpose:  Therapeutic   Preparation: Patient was prepped and draped in usual sterile fashion   Anesthesia (see MAR for exact dosages):    Anesthesia method:  Local infiltration   Local anesthetic:  Lidocaine 1% w/o epi Procedure details:    Lumbar space:  L3-L4 interspace   Patient position:  Sitting   Needle gauge:  22   Needle type:  Spinal needle - Quincke tip   Needle length (in):  5.0   Ultrasound guidance: no     Number of attempts:  2   Fluid appearance:  Clear   Tubes of fluid:  3   Total volume (ml):  25 Post-procedure:    Puncture site:  Adhesive bandage applied and direct pressure applied   Patient tolerance of procedure:  Tolerated well, no immediate complications   (including critical care  time)  Medications Ordered in ED Medications  lidocaine (XYLOCAINE) 2 % (with pres) injection 400 mg (400 mg Infiltration Given 07/03/16 0043)  sodium chloride 0.9 % bolus 1,000 mL (0 mLs Intravenous Stopped 07/03/16 0154)  HYDROmorphone (DILAUDID) injection 1 mg (1 mg Intravenous Given 07/03/16 0122)  metoCLOPramide (REGLAN) injection 10 mg (10 mg Intravenous Given 07/03/16 0121)  diphenhydrAMINE (BENADRYL) injection 25 mg (25 mg Intravenous Given 07/03/16 0121)     Initial Impression / Assessment and Plan / ED Course  I have reviewed the triage vital signs and the nursing notes.  Pertinent labs & imaging results that were available during my care of the patient were reviewed by me and considered in my medical decision making (see chart for details).  Clinical Course   Patient presented to the emergency department for evaluation of headache and blurred vision. Patient has a known history of pseudotumor cerebri. She has required lumbar puncture in the past, however, she is now maintained on acetazolamide and has been doing well. I had difficulty feeling her fundus. There was no evidence of hemorrhage noted, but could not evaluate for papilledema. Patient did not tolerate procedure well because of photosensitivity. I discussed treatment options with her. I recommended therapeutic lumbar puncture. After discussing risks and benefits she did give consent. Procedure was performed, 25 mL of fluid was drained. Patient's blood pressure and headache improved. She was flat for more than an hour and received IV fluids postprocedure. She will continue her current medication regimen and follow-up with her neurologist.  Final Clinical Impressions(s) / ED Diagnoses   Final diagnoses:  Bad headache  Pseudotumor cerebri    New Prescriptions New Prescriptions   No medications on file  I personally performed the services described in this documentation, which was scribed in my presence. The recorded  information has been reviewed and is accurate.     Lindsey Crease, MD 07/03/16 [phone]

## 2016-07-02 NOTE — ED Triage Notes (Addendum)
Pt states that she has "pseudo tumor cerebri." Pt has taken her prescribed medication for the fluid on her head in 4 months. Pt presents today with sever head ache, pain behind her eyes, blurry vision and back pain. A&Ox4. Ambulatory,

## 2016-07-03 LAB — CBC WITH DIFFERENTIAL/PLATELET
Basophils Absolute: 0 10*3/uL (ref 0.0–0.1)
Basophils Relative: 0 %
Eosinophils Absolute: 0.1 10*3/uL (ref 0.0–0.7)
Eosinophils Relative: 1 %
HEMATOCRIT: 39.3 % (ref 36.0–46.0)
Hemoglobin: 12.6 g/dL (ref 12.0–15.0)
Lymphocytes Relative: 16 %
Lymphs Abs: 1.9 10*3/uL (ref 0.7–4.0)
MCH: 27.2 pg (ref 26.0–34.0)
MCHC: 32.1 g/dL (ref 30.0–36.0)
MCV: 84.9 fL (ref 78.0–100.0)
MONO ABS: 0.6 10*3/uL (ref 0.1–1.0)
MONOS PCT: 5 %
Neutro Abs: 9.1 10*3/uL — ABNORMAL HIGH (ref 1.7–7.7)
Neutrophils Relative %: 78 %
Platelets: 358 10*3/uL (ref 150–400)
RBC: 4.63 MIL/uL (ref 3.87–5.11)
RDW: 15.6 % — AB (ref 11.5–15.5)
WBC: 11.6 10*3/uL — ABNORMAL HIGH (ref 4.0–10.5)

## 2016-07-03 LAB — BASIC METABOLIC PANEL
Anion gap: 7 (ref 5–15)
BUN: 18 mg/dL (ref 6–20)
CALCIUM: 9.4 mg/dL (ref 8.9–10.3)
CO2: 23 mmol/L (ref 22–32)
Chloride: 104 mmol/L (ref 101–111)
Creatinine, Ser: 1.06 mg/dL — ABNORMAL HIGH (ref 0.44–1.00)
GFR calc non Af Amer: >60 mL/min (ref >60)
GLUCOSE: 319 mg/dL — AB (ref 65–99)
Potassium: 4.3 mmol/L (ref 3.5–5.1)
Sodium: 134 mmol/L — ABNORMAL LOW (ref 135–145)

## 2016-07-03 LAB — PROTIME-INR
INR: 1.09
Prothrombin Time: 14.1 seconds (ref 11.4–15.2)

## 2016-07-03 MED ORDER — HYDROCODONE-ACETAMINOPHEN 5-325 MG PO TABS: 1.0000 | ORAL_TABLET | ORAL | 0 refills | Status: DC | PRN

## 2016-07-03 MED ORDER — HYDROMORPHONE HCL 1 MG/ML IJ SOLN
1.0000 mg | Freq: Once | INTRAMUSCULAR | Status: AC
  Administered 2016-07-03: 1 mg via INTRAVENOUS
  Filled 2016-07-03: qty 1

## 2016-07-03 MED ORDER — METOCLOPRAMIDE HCL 5 MG/ML IJ SOLN
10.0000 mg | Freq: Once | INTRAMUSCULAR | Status: AC
  Administered 2016-07-03: 10 mg via INTRAVENOUS
  Filled 2016-07-03: qty 2

## 2016-07-03 MED ORDER — SODIUM CHLORIDE 0.9 % IV BOLUS (SEPSIS)
1000.0000 mL | Freq: Once | INTRAVENOUS | Status: AC
  Administered 2016-07-03: 1000 mL via INTRAVENOUS

## 2016-07-03 MED ORDER — DIPHENHYDRAMINE HCL 50 MG/ML IJ SOLN
25.0000 mg | Freq: Once | INTRAMUSCULAR | Status: AC
  Administered 2016-07-03: 25 mg via INTRAVENOUS
  Filled 2016-07-03: qty 1

## 2016-07-03 NOTE — ED Notes (Signed)
No respiratory or acute distress noted clear speech noted alert and oriented x 3 family at bedside moves all extremities.

## 2016-07-15 ENCOUNTER — Ambulatory Visit: Payer: 59 | Admitting: Neurology

## 2016-07-15 DIAGNOSIS — Z029 Encounter for administrative examinations, unspecified: Secondary | ICD-10-CM

## 2016-10-06 IMAGING — CT CT HEAD W/O CM
3 of 4 series · 15 of 47 positions shown, 18 images · non-contrast
Comparison: CT of the head performed 09/03/2011

CLINICAL DATA: Acute onset of severe headache and pain behind the
eyes. Blurred vision. Initial encounter.

EXAM:
CT HEAD WITHOUT CONTRAST
TECHNIQUE: Contiguous axial images were obtained from the base of the skull
through the vertex without intravenous contrast.

[Series 2: head w/o · axial · non-contrast · 0.45mm/px · z∈[-167,-42]mm · 9 of 31 slices shown, 12 images]
[im 3/31  brain]
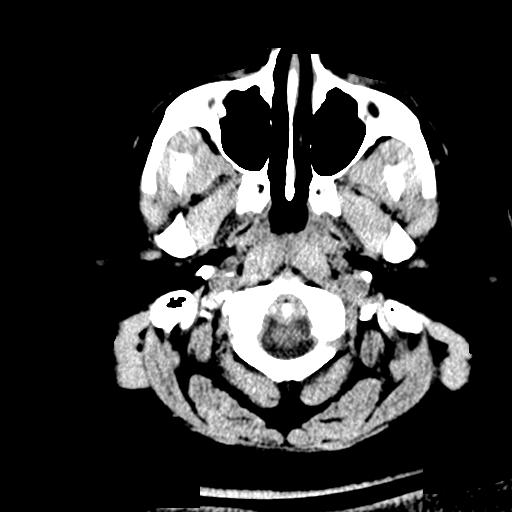
[im 3/31  bone]
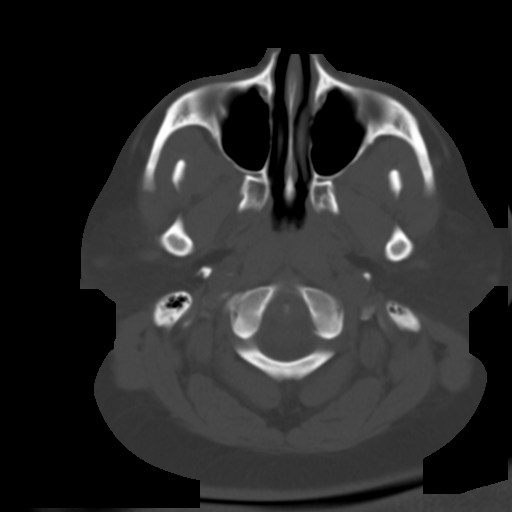
[im 7/31  brain]
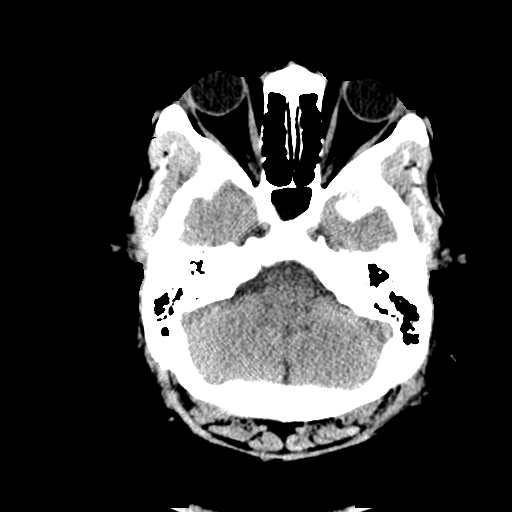
[im 9/31  brain]
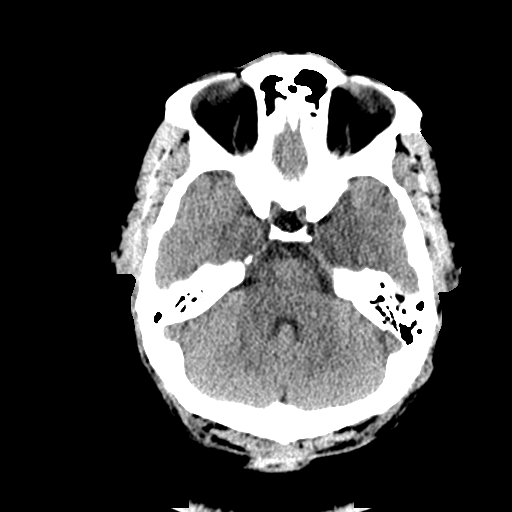
[im 13/31  brain]
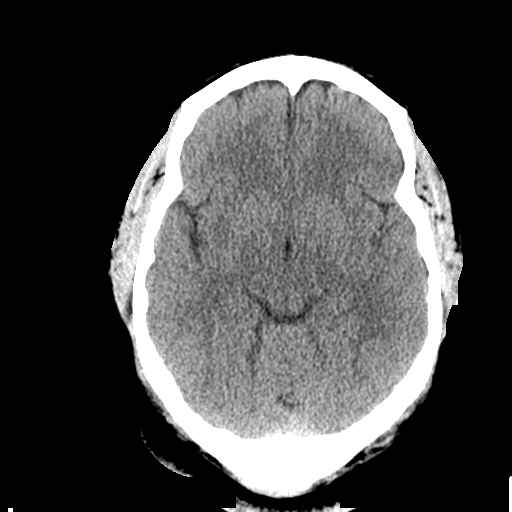
[im 16/31  brain]
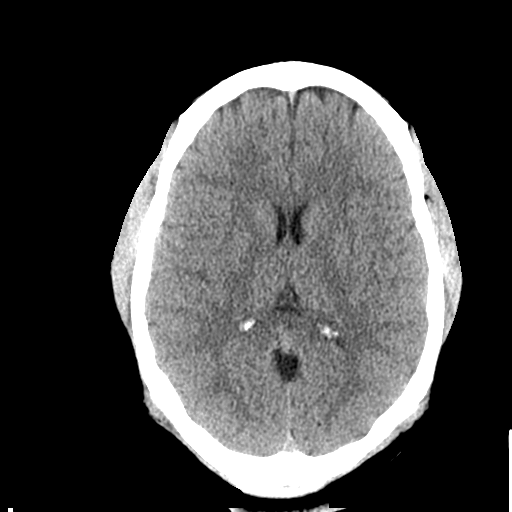
[im 16/31  bone]
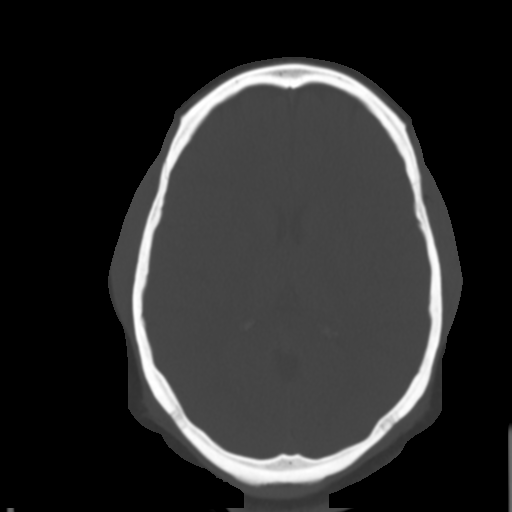
[im 18/31  brain]
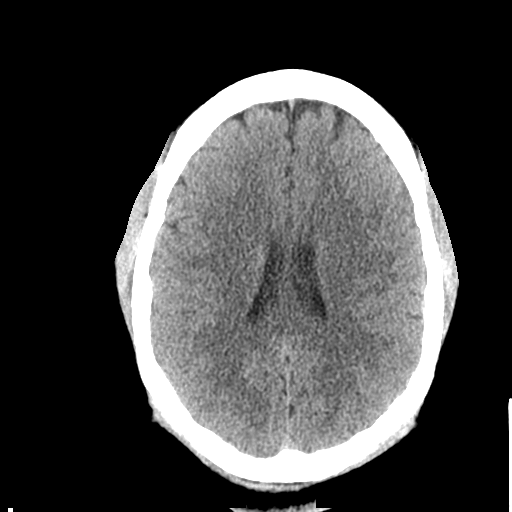
[im 22/31  brain]
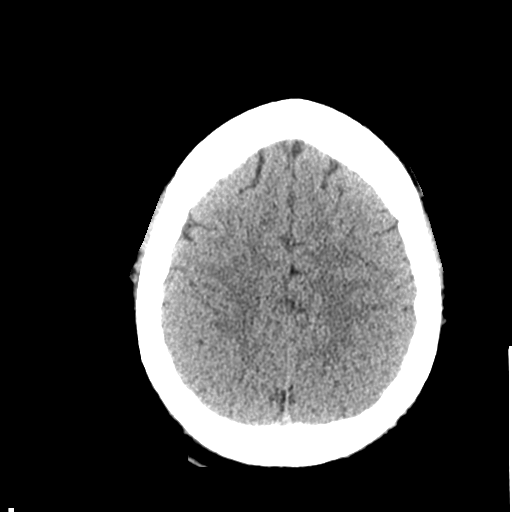
[im 24/31  brain]
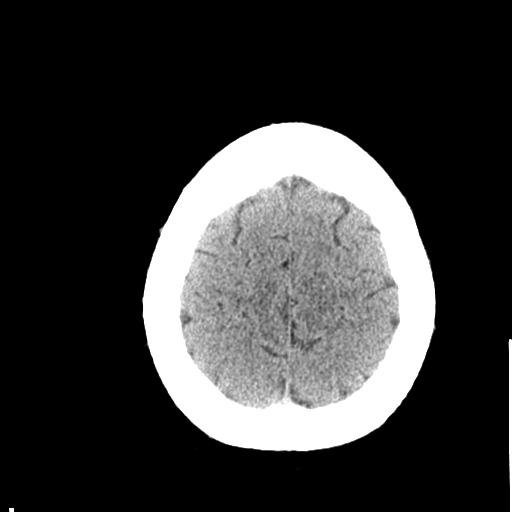
[im 28/31  brain]
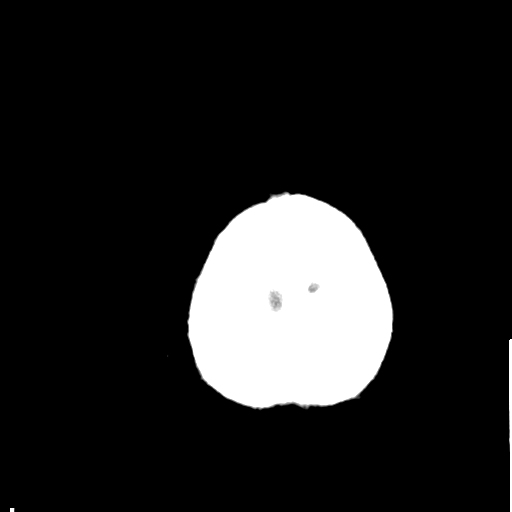
[im 28/31  bone]
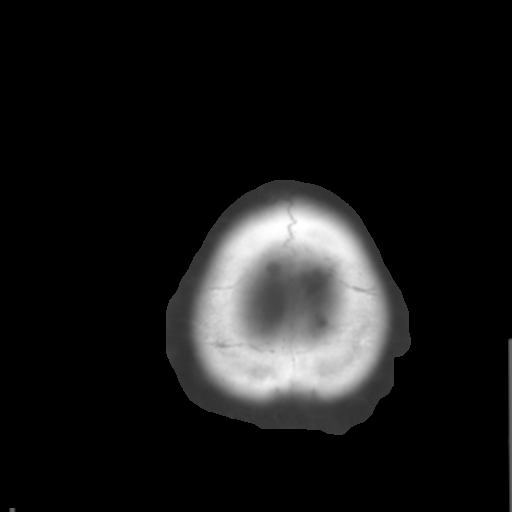

[Series 5: coronal · coronal · 0.29mm/px · 3 of 63 slices shown]
[im 21/63  brain]
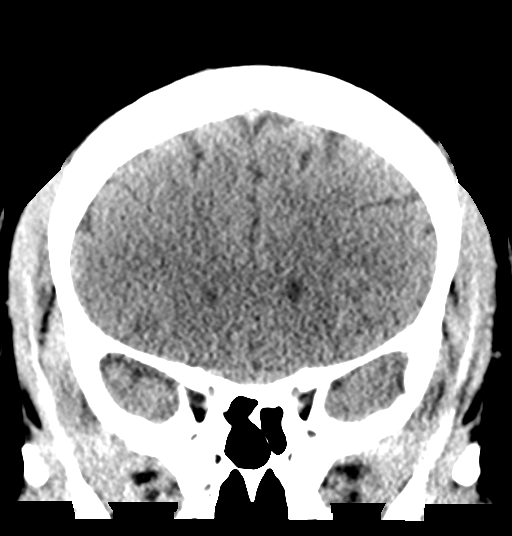
[im 28/63  brain]
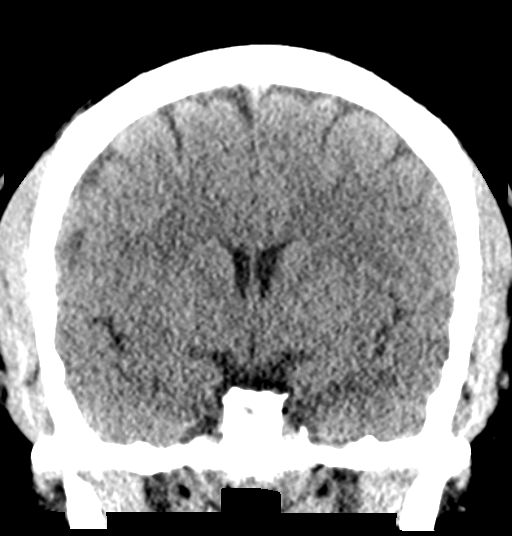
[im 35/63  brain]
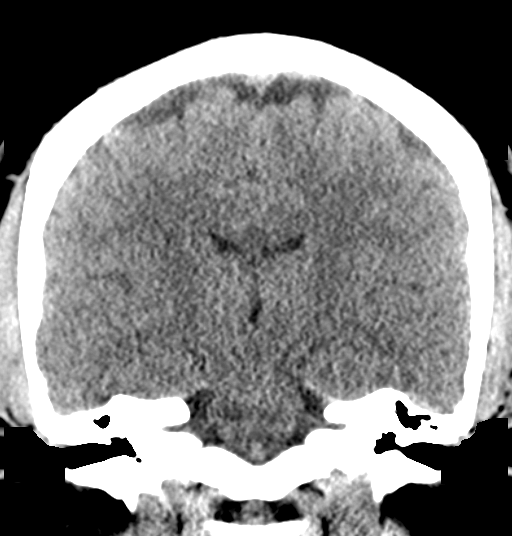

[Series 6: sagittal · sagittal · 0.31mm/px · 3 of 47 slices shown]
[im 16/47  brain]
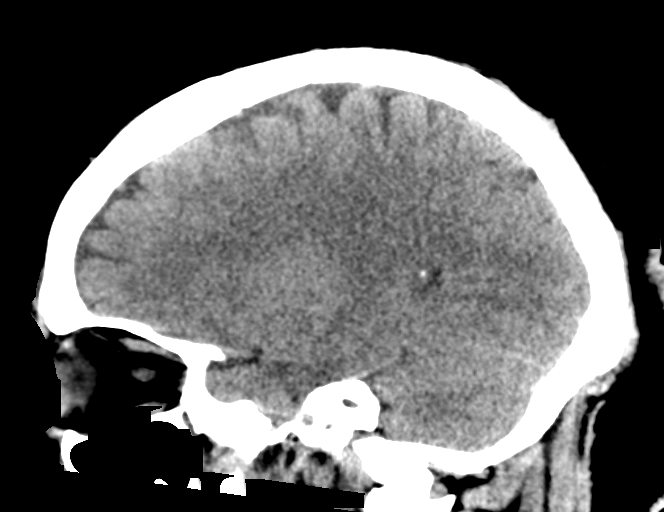
[im 24/47  brain]
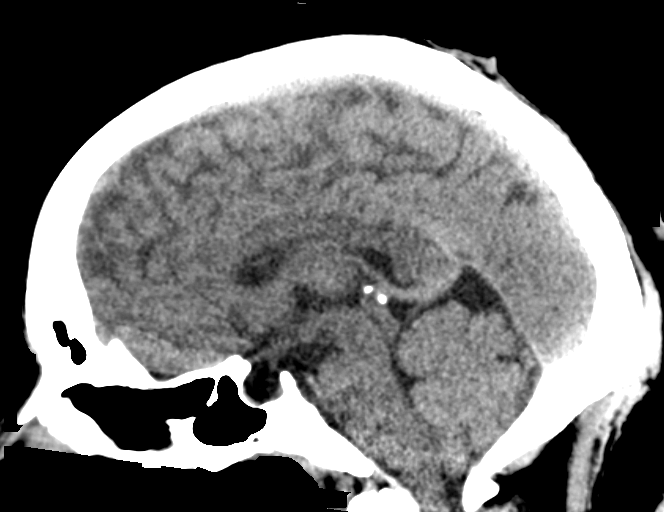
[im 31/47  brain]
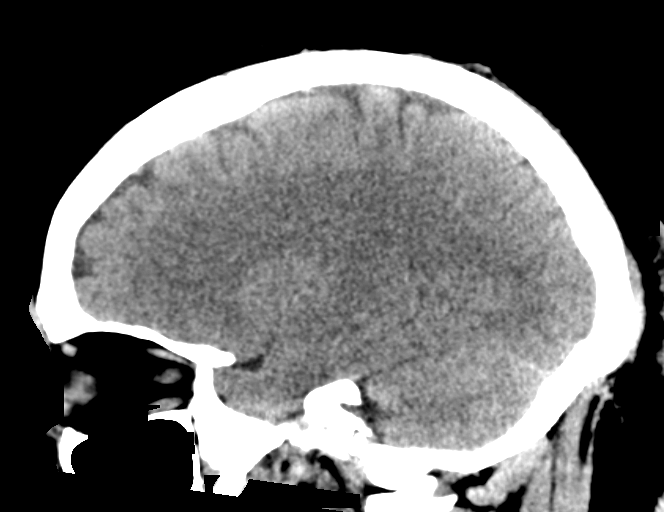

[15 of 47 positions shown; findings below may reference images not displayed]

FINDINGS: Brain: No evidence of acute infarction, hemorrhage, hydrocephalus,
extra-axial collection or mass lesion/mass effect.

The posterior fossa, including the cerebellum, brainstem and fourth
ventricle, is within normal limits. The third and lateral
ventricles, and basal ganglia are unremarkable in appearance. The
cerebral hemispheres are symmetric in appearance, with normal
gray-white differentiation. No mass effect or midline shift is seen.

Vascular: No hyperdense vessel or unexpected calcification.

Skull: There is no evidence of fracture; visualized osseous
structures are unremarkable in appearance.

Sinuses/Orbits: The visualized portions of the orbits are within
normal limits. The paranasal sinuses and mastoid air cells are
well-aerated.

Other: No significant soft tissue abnormalities are seen.
IMPRESSION: Unremarkable noncontrast CT of the head.

## 2017-04-06 ENCOUNTER — Ambulatory Visit (INDEPENDENT_AMBULATORY_CARE_PROVIDER_SITE_OTHER): Payer: Self-pay | Admitting: Sports Medicine

## 2018-01-25 ENCOUNTER — Inpatient Hospital Stay: Payer: Commercial Managed Care - POS | Attending: Family

## 2018-01-25 DIAGNOSIS — M546 Pain in thoracic spine: Secondary | ICD-10-CM | POA: Insufficient documentation

## 2018-01-25 NOTE — Progress Notes (Signed)
Name: Advika Mclelland Age: 32 y.o.   Referring Physician: Merri Ray*   Date of Injury: 07/20/2017  Date Care Plan Established/Reviewed: 01/25/2018  Date Treatment Started: 01/25/2018  Visit Count: 1   Diagnosis:   1. Acute left-sided thoracic back pain        Subjective     History of Present Illness   History of Present Illness: L thoracic off and on,  not continuous back in September. Tightness in neck, shoulder and gets occasional tingling. Haven't figured out what causes it. 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  Functional Limitations (PLOF): If sitting at desk for longer periods of time will feel it at night /(hx of thoraic pain)  Will occasionally have pain during workouts(hx of thoraic pain)  Pain during random times/((hx of thoraic pain)      Objective     Range of Motion     AROM: Cervical/Thoracic Spine       Cervical Flexion 50    Cervical Extension 45    Thoracic Flexion     Thoracic Extension 10    (blank fields were intentionally left blank)         Left AROM Cervical/Thoracic    Right AROM   45  Cervical Lateral Flexion 35    75  Cervical Rotation 65      Thoracic Lateral Flexion       Thoracic Rotation     (blank fields were intentionally left blank)       Left AROM    Left PROM   Shoulder    Right AROM    Right PROM    WFL   Flexion    pain       Extension        WFL   Abduction    pain    WFL   Internal Rotation    pain    WFL   External Rotation    pain   (blank fields were intentionally left blank)  Tingling reported with L shoulder ER    Strength        Left Strength  Shoulder      Right   5  Neck Lateral Flexion        Shoulder Flexion 5      Shoulder Extension     5   Shoulder Abduction 5    4+  Shoulder IR 5    4+  Shoulder ER 5      Serratus       Rhomboids       Upper Trapezius     4-  Middle Trapezius 4    3+  Lower Trapezius 4-      Biceps       Triceps     (blank fields were intentionally left  blank)        Tests   Cervical     Left (Cervical)   Positive ULTT4.     Neurological Testing     Additional Neurological Details  Roo's +             Treatment     Therapeutic Exercises   edu on POC, impairments  edu on HEP with print out    Manual Therapy   STM to L upper trap and scalenes    Modalities   Access Code: [email or web address]   URL: [email or web address]   Date: 01/25/2018   Prepared  by: Magdalene River     Exercises  Seated Scalene Stretch with Towel - 3 sets - 30 hold - 1x daily - 7x weekly  Median Nerve Glide - 10 reps - 2 sets - 1x daily - 7x weekly  Radial Nerve Flossing - 10 reps - 2 sets - 1x daily - 7x weekly  Single Arm Doorway Pec Stretch at 90 Degrees Abduction - 10 reps - 1 sets - 1x daily - 7x weekly    -self scalene release       ---      ---   Total Time   Timed Minutes  20 minutes   Untimed Minutes  20 minutes   Total Time  40 minutes        Assessment   Jordi is a 32 y.o. 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  Prior Level of Function: If sitting at desk for longer periods of time will feel it at night /(hx of thoraic pain)  Will occasionally have pain during workouts(hx of thoraic pain)  Pain during random times/((hx of thoraic pain)    Prognosis: good  Plan   Visits per week: 2  Number of Sessions: 16  [employer] on One  54098: Therapeutic Exercise: To Develop Strength and Endurance, ROM and Flexibility  [id number]: Neuromuscular Reeducation  97140: Manual Therapy techniques (mobilization, manipulation, manual traction) (STM to cervical, thoraic, shoulder mobs 1-4)  97530: Therapeutic Activities: Dynamic activities to  improve functional performance  Dry Needling  Supervised Modalities  97010: Thermal modalities: hot/cold packs  11914: Electrical stimulation  Taping as needed      Goals    Goal 1:  Patient will demonstrate independence in prescribed HEP with proper form, sets and reps for safe discharge to an independent program.   Sessions:  16      Goal 2:  Demonstrate proper posture and body mechanics with sitting so patient can sit throughout workday and class wwithout difficulty   Sessions:  16      Goal 3:  Demonstrate proper posture and body mechanics with lifting at gym so pt does not reproduce symptoms   Sessions:  16      Goal 4:  Pt will improve scapular strength to 4+/5 to improve postural endurance   Sessions:  [address], PT

## 2018-01-28 DIAGNOSIS — M546 Pain in thoracic spine: Secondary | ICD-10-CM | POA: Insufficient documentation

## 2018-01-28 NOTE — PT/OT Therapy Note (Signed)
Name: Tracey Burns  Referring Physician: Merri Ray*  Diagnosis:     ICD-10-CM    1. Acute left-sided thoracic back pain M54.6         Precautions:   Date of Surgery:    MD Follow-up:           Exercise Flow Sheet    Exercise Specifics              UBE                 I's/T's/Y's                 B ER                 Pro/ret                 POE                                                                                                                       Home Exercise Program                 (Initials = supervised exercise by clinician)

## 2018-01-29 ENCOUNTER — Inpatient Hospital Stay: Payer: Commercial Managed Care - POS | Attending: Family

## 2018-01-29 DIAGNOSIS — M546 Pain in thoracic spine: Secondary | ICD-10-CM | POA: Insufficient documentation

## 2018-01-29 NOTE — PT/OT Therapy Note (Signed)
Name: Giavonna Pflum Age: 32 y.o.   Referring Physician: Merri Ray*   Date of Injury: 07/20/2017  Date Care Plan Established/Reviewed: 01/25/2018  Date Treatment Started: 01/25/2018  Visit Count: 2   Diagnosis:   1. Acute left-sided thoracic back pain        Subjective     Outcome Measure   Tool Used/Details: FOTO  Score: 76  Predicted Functional Outcome: 63    Social Support/Occupation      Occupation: translator/student    Pt reports she was sore after doing the active pec stretch. No other changes except for that.          Objective     Range of Motion     AROM: Cervical/Thoracic Spine       Cervical Flexion 50    Cervical Extension 45    Thoracic Flexion     Thoracic Extension 10    (blank fields were intentionally left blank)         Left AROM Cervical/Thoracic    Right AROM   45  Cervical Lateral Flexion 35    75  Cervical Rotation 65      Thoracic Lateral Flexion       Thoracic Rotation     (blank fields were intentionally left blank)       Left AROM    Left PROM   Shoulder    Right AROM    Right PROM    WFL   Flexion    pain       Extension        WFL   Abduction    pain    WFL   Internal Rotation    pain    WFL   External Rotation    pain   (blank fields were intentionally left blank)  Tingling reported with L shoulder ER    Strength        Left Strength  Shoulder      Right   5  Neck Lateral Flexion        Shoulder Flexion 5      Shoulder Extension     5   Shoulder Abduction 5    4+  Shoulder IR 5    4+  Shoulder ER 5      Serratus       Rhomboids       Upper Trapezius     4-  Middle Trapezius 4    3+  Lower Trapezius 4-      Biceps       Triceps     (blank fields were intentionally left blank)      Tests   Cervical     Left (Cervical)   Positive ULTT4.            Treatment     Neuromuscular Re-Education   Justification: Scapular stability with focus on scap depression, with proper  cueing to reduce UT/scalene compensation  See flowsheet    Manual Therapy   Justification: Improve thoracic mobility and reduce nerve tension  STM to L pecs, scalenes, UT, serratus, teres in supine  Rockblade/STM to L thoracic pvms, lats,  CPAs to L rib with passive scap retraction       ---      ---   Total Time   Timed Minutes  40 minutes   Total Time  40 minutes        Assessment   First treatment session today (arrived 5 min  late). Pt reporting most tenderness at serratus and teres. Good tolerance to rib mobs. Progressed scapular stability without reports of pain, but did require cueing throughout session. Recommended pt make these modifications in form for her workouts.   Plan   Cont with POC  Progress as tolerated      Goals    Goal 1:  Patient will demonstrate independence in prescribed HEP with proper form, sets and reps for safe discharge to an independent program.   Sessions:  16      Goal 2:  Demonstrate proper posture and body mechanics with sitting so patient can sit throughout workday and class wwithout difficulty   Sessions:  16      Goal 3:  Demonstrate proper posture and body mechanics with lifting at gym so pt does not reproduce symptoms   Sessions:  16      Goal 4:  Pt will improve scapular strength to 4+/5 to improve postural endurance   Sessions:  16                              Exercise Flow Sheet    Exercise Specifics 01/29/18             UBE    -             I's/T's/Y's    -             B ER    RTB  3x10  LS  nmr               Pro/ret    table  Pro/ret  2x10  LS  nmr             POE    RTB  10''x5  LS  nmr             Rows/PD    black 30x ea  LS  nmr             alt hor abd    RTB  2x10  LS  nmr                                                                                 Home Exercise Program                 (Initials =  supervised exercise by clinician)        Magdalene River, PT

## 2018-01-31 ENCOUNTER — Inpatient Hospital Stay: Payer: Commercial Managed Care - POS | Attending: Family

## 2018-01-31 DIAGNOSIS — M546 Pain in thoracic spine: Secondary | ICD-10-CM | POA: Insufficient documentation

## 2018-01-31 NOTE — PT/OT Therapy Note (Signed)
Name: Tracey Burns Age: 32 y.o.   Referring Physician: Merri Ray*   Date of Injury: 07/20/2017  Date Care Plan Established/Reviewed: 01/25/2018  Date Treatment Started: 01/25/2018  Visit Count: 3   Diagnosis:   1. Acute left-sided thoracic back pain        Subjective     Outcome Measure   Tool Used/Details: FOTO  Score: 76  Predicted Functional Outcome: 72    Social Support/Occupation      Occupation: translator/student    Patient reports increased pins and needles in her hand yesterday - not sure why. Was really sore after PT session from the manual - tender to put on deodorant.                 Objective     Range of Motion     AROM: Cervical/Thoracic Spine       Cervical Flexion 50    Cervical Extension 45    Thoracic Flexion     Thoracic Extension 10    (blank fields were intentionally left blank)         Left AROM Cervical/Thoracic    Right AROM   45  Cervical Lateral Flexion 35    75  Cervical Rotation 65      Thoracic Lateral Flexion       Thoracic Rotation     (blank fields were intentionally left blank)       Left AROM    Left PROM   Shoulder    Right AROM    Right PROM    WFL   Flexion    pain       Extension        WFL   Abduction    pain    WFL   Internal Rotation    pain    WFL   External Rotation    pain   (blank fields were intentionally left blank)  Tingling reported with L shoulder ER    Strength        Left Strength  Shoulder      Right   5  Neck Lateral Flexion        Shoulder Flexion 5      Shoulder Extension     5   Shoulder Abduction 5    4+  Shoulder IR 5    4+  Shoulder ER 5      Serratus       Rhomboids       Upper Trapezius     4-  Middle Trapezius 4    3+  Lower Trapezius 4-      Biceps       Triceps     (blank fields were intentionally left blank)      Tests   Cervical     Left (Cervical)   Positive ULTT4.            Treatment     Therapeutic Exercises    Justification: To improve flexibility/ROM, strength, and endurance.    As marked in flowsheet      Neuromuscular Re-Education   Justification: Scapular stability with focus on scap depression, with proper cueing to reduce UT/scalene compensation  See flowsheet  Extensive tactile cues to improve awareness and recruitment of desired muscles.    Manual Therapy   Justification: Improve thoracic mobility and reduce nerve tension  STM/DTM subscap, infraspinatus, UT, rhomboids/mid trap, borders of scap  PA mobs at thoracic spine grade 1-3  METs for  thoracic rotation and ant/posterior ribs bilaterally  Scapular mobilization: inferior and upward rotation glides; attempted distraction  Subscap release with and without AROM ER       ---      ---   Total Time   Timed Minutes  41 minutes   Total Time  41 minutes        Assessment   Patient continues to present with rib dysfunction bilaterally and soft tissue tightness throughout scapular and chest musculature consistent with persistent tenderness, pain, and radicular sx. Increased tingling in hand with TPR to infraspinatus consistent with referred pain pattern so reviewed self-massage with tennis ball. Exercises with emphasis on strengthening postural muscles with component of spinal mobility and focus on proper scap engagement/stabilization. Intermittent tactile cues needed to minimize UT compensation when fatigued but overall patient attempts to self-monitor form.  Patient would benefit from continued skilled PT to decrease rounded shoulder posture and improve postural muscle strength.  Plan   Cont with POC. Assess response to tx. Consider ktape at thoracic musculature.   Progress as tolerated      Goals    Goal 1:  Patient will demonstrate independence in prescribed HEP with proper form, sets and reps for safe discharge to an independent program.   Sessions:  16      Goal 2:  Demonstrate proper posture and body mechanics with sitting so patient can sit throughout workday  and class wwithout difficulty   Sessions:  16      Goal 3:  Demonstrate proper posture and body mechanics with lifting at gym so pt does not reproduce symptoms   Sessions:  16      Goal 4:  Pt will improve scapular strength to 4+/5 to improve postural endurance   Sessions:  16                              Exercise Flow Sheet    Exercise Specifics 01/29/18 01/31/18          UBE    - -          I's/T's/Y's    - -          B ER    RTB  3x10  LS  nmr   GTB 10x2 jm          Pro/ret    table  Pro/ret  2x10  LS  nmr 10x2 jm          POE    RTB  10''x5  LS  nmr --          Rows/PD    black 30x ea  LS  nmr Blk tube 10x2 jm          alt hor abd    RTB  2x10  LS  nmr --               supin eon 1/2 foam: H Abbd RTB 10x2 jm    flexion RTB 15x jm                                                       Home Exercise Program               (Initials = supervised exercise by clinician)  Currie Paris, LPTA

## 2018-02-05 ENCOUNTER — Inpatient Hospital Stay: Payer: Commercial Managed Care - POS | Attending: Family

## 2018-02-05 DIAGNOSIS — M546 Pain in thoracic spine: Secondary | ICD-10-CM | POA: Insufficient documentation

## 2018-02-05 NOTE — PT/OT Therapy Note (Signed)
Name: Tracey Burns Age: 32 y.o.   Referring Physician: Merri Ray*   Date of Injury: 07/20/2017  Date Care Plan Established/Reviewed: 01/25/2018  Date Treatment Started: 01/25/2018  Visit Count: 4   Diagnosis:   1. 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 Got tense sitting at the desk today. Some soreness the next day after last visit. Have not worked out with trainer since this all happened so wondering if there's anything to be avoiding.                 Objective     Range of Motion     AROM: Cervical/Thoracic Spine       Cervical Flexion 50    Cervical Extension 45    Thoracic Flexion     Thoracic Extension 10    (blank fields were intentionally left blank)         Left AROM Cervical/Thoracic    Right AROM   45  Cervical Lateral Flexion 35    75  Cervical Rotation 65      Thoracic Lateral Flexion       Thoracic Rotation     (blank fields were intentionally left blank)       Left AROM    Left PROM   Shoulder    Right AROM    Right PROM    WFL   Flexion    pain       Extension        WFL   Abduction    pain    WFL   Internal Rotation    pain    WFL   External Rotation    pain   (blank fields were intentionally left blank)  Tingling reported with L shoulder ER    Strength        Left Strength  Shoulder      Right   5  Neck Lateral Flexion        Shoulder Flexion 5      Shoulder Extension     5   Shoulder Abduction 5    4+  Shoulder IR 5    4+  Shoulder ER 5      Serratus       Rhomboids       Upper Trapezius     4-  Middle Trapezius 4    3+  Lower Trapezius 4-      Biceps       Triceps     (blank fields were intentionally left blank)      Tests   Cervical     Left  (Cervical)   Positive ULTT4.            Treatment     Therapeutic Exercises   Justification: To improve flexibility/ROM, strength, and endurance.    As marked in flowsheet  -gym recommendations- avoid chin ups for now and make sure trainer is watching for UT elevation and excessive neck activation      Neuromuscular Re-Education   Justification: Scapular stability with focus on scap depression, with proper cueing to reduce UT/scalene compensation  See flowsheet  Extensive tactile cues to improve awareness and recruitment of desired muscles-see flowsheet  Manual Therapy   Justification: Improve thoracic mobility and reduce nerve tension  STM/DTM to subsap, infraspinatus, teres, rhomboid, thoracic pvms, middle trap in prone         ---      ---   Total Time   Timed Minutes  43 minutes   Total Time  43 minutes        Assessment   Able to progress scapular strengthening today. Required continual cueing as pt wanted to initiate with neck. Discussed returning to gym with trainer. Pt is becoming more aware of compensation patterns. Patient would benefit from continued skilled PT to decrease rounded shoulder posture and improve postural muscle strength to improve postural tolerance throughout workday.   Plan   Cont with POC. Consider ktape at thoracic musculature.         Goals    Goal 1:  Patient will demonstrate independence in prescribed HEP with proper form, sets and reps for safe discharge to an independent program.   Sessions:  16      Goal 2:  Demonstrate proper posture and body mechanics with sitting so patient can sit throughout workday and class wwithout difficulty   Sessions:  16      Goal 3:  Demonstrate proper posture and body mechanics with lifting at gym so pt does not reproduce symptoms   Sessions:  16      Goal 4:  Pt will improve scapular strength to 4+/5 to improve postural endurance   Sessions:  16                              Exercise Flow Sheet    Exercise Specifics 01/29/18 01/31/18 02/05/18          UBE    - - -         I's/T's/Y's    - - I's  2#  T's  1#  Y's  2x10  LS  nmr         B ER    RTB  3x10  LS  nmr   GTB 10x2 jm GTB  10x2  LS         Pro/ret    table  Pro/ret  2x10  LS  nmr 10x2 jm 10x2  RTB  LS         POE    RTB  10''x5  LS  nmr -- -         Rows/PD    black 30x ea  LS  nmr Blk tube 10x2 jm blk tub  10x3 ea  LS         alt hor abd    RTB  2x10  LS  nmr -- -              supin eon 1/2 foam: H Abbd RTB 10x2 jm    flexion RTB 15x jm -               Prone function IR to ER  15x  LS               quadruped thoracic rot  2x10B  LS                        Home Exercise Program               (Initials = supervised exercise by clinician)  Georgette Shell, PT

## 2018-02-08 ENCOUNTER — Inpatient Hospital Stay: Payer: Commercial Managed Care - POS | Attending: Family

## 2018-02-08 DIAGNOSIS — M546 Pain in thoracic spine: Secondary | ICD-10-CM | POA: Insufficient documentation

## 2018-02-08 NOTE — PT/OT Therapy Note (Signed)
Name: Tracey Burns Age: 32 y.o.   Referring Physician: Merri Ray*   Date of Injury: 07/20/2017  Date Care Plan Established/Reviewed: 01/25/2018  Date Treatment Started: 01/25/2018  Visit Count: 5   Diagnosis:   1. Acute left-sided thoracic back pain        Subjective     Outcome Measure   Tool Used/Details: FOTO  Score: 76  Predicted Functional Outcome: 55    Social Support/Occupation      Occupation: translator/student    Patient reports she had to reschedule her personal training session. She is not feeling as sore after sessions as she originally was.                 Objective     Range of Motion     AROM: Cervical/Thoracic Spine       Cervical Flexion 50    Cervical Extension 45    Thoracic Flexion     Thoracic Extension 10    (blank fields were intentionally left blank)         Left AROM Cervical/Thoracic    Right AROM   45  Cervical Lateral Flexion 35    75  Cervical Rotation 65      Thoracic Lateral Flexion       Thoracic Rotation     (blank fields were intentionally left blank)       Left AROM    Left PROM   Shoulder    Right AROM    Right PROM    WFL   Flexion    pain       Extension        WFL   Abduction    pain    WFL   Internal Rotation    pain    WFL   External Rotation    pain   (blank fields were intentionally left blank)  Tingling reported with L shoulder ER    Strength        Left Strength  Shoulder      Right   5  Neck Lateral Flexion        Shoulder Flexion 5      Shoulder Extension     5   Shoulder Abduction 5    4+  Shoulder IR 5    4+  Shoulder ER 5      Serratus       Rhomboids       Upper Trapezius     4-  Middle Trapezius 4    3+  Lower Trapezius 4-      Biceps       Triceps     (blank fields were intentionally left blank)      Tests   Cervical     Left (Cervical)   Positive ULTT4.            Treatment     Therapeutic Exercises   Justification: To improve  flexibility/ROM, strength, and endurance.    As marked in flowsheet        Neuromuscular Re-Education   Justification: Scapular stability with focus on scap depression, with proper cueing to reduce UT/scalene compensation  See flowsheet  Extensive tactile cues to improve awareness and recruitment of proper scapular musculature-see flowsheet    Manual Therapy   Justification: Improve thoracic mobility and reduce nerve tension  STM/DTM to subsap, infraspinatus, teres, rhomboid, thoracic pvms, middle trap in prone         ---      ---  Total Time   Timed Minutes  40 minutes   Total Time  40 minutes        Assessment   Progressed to a third set today with good tolerance. Pt becoming more aware of form and is requiring less cues. Pt will benefit from further progression next session. Patient would benefit from continued skilled PT to  improve postural muscle strength to improve postural tolerance throughout workday.   Plan   Cont with POC. Progress as tolerated. Consider ktape at thoracic musculature.         Goals    Goal 1:  Patient will demonstrate independence in prescribed HEP with proper form, sets and reps for safe discharge to an independent program.   Sessions:  16      Goal 2:  Demonstrate proper posture and body mechanics with sitting so patient can sit throughout workday and class wwithout difficulty   Sessions:  16      Goal 3:  Demonstrate proper posture and body mechanics with lifting at gym so pt does not reproduce symptoms   Sessions:  16      Goal 4:  Pt will improve scapular strength to 4+/5 to improve postural endurance   Sessions:  16                              Exercise Flow Sheet    Exercise Specifics 01/29/18 01/31/18 02/05/18 02/08/18        UBE    - - - -        I's/T's/Y's    - - I's  2#  T's  1#  Y's  2x10  LS  nmr I's  2#  T's  1#  Y's  2x10  LS  nmr        B ER    RTB  3x10  LS  nmr   GTB 10x2 jm GTB  10x2  LS GTB  10x3  LS        Pro/ret     table  Pro/ret  2x10  LS  nmr 10x2 jm 10x2  RTB  LS 10x3  RTB  3x10  LS        POE    RTB  10''x5  LS  nmr -- - -        Rows/PD    black 30x ea  LS  nmr Blk tube 10x2 jm blk tub  10x3 ea  LS black tub 10x3 ea  LS        alt hor abd    RTB  2x10  LS  nmr -- - -             supin eon 1/2 foam: H Abbd RTB 10x2 jm    flexion RTB 15x jm - -              Prone function IR to ER  15x  LS -              quadruped thoracic rot  2x10B  LS -                       Home Exercise Program               (Initials = supervised exercise by clinician)        Magdalene River, PT

## 2018-02-12 ENCOUNTER — Inpatient Hospital Stay: Payer: Commercial Managed Care - POS | Attending: Family

## 2018-02-12 DIAGNOSIS — M546 Pain in thoracic spine: Secondary | ICD-10-CM | POA: Insufficient documentation

## 2018-02-12 NOTE — PT/OT Therapy Note (Signed)
Name: Tracey Burns Age: 32 y.o.   Referring Physician: Merri Ray*   Date of Injury: 07/20/2017  Date Care Plan Established/Reviewed: 01/25/2018  Date Treatment Started: 01/25/2018  Visit Count: 6   Diagnosis:   1. 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 She was achy after the session and feel like she's getting achiness at the collar bone and thoracic region again. Did a personal training session and she could feel it.                 Objective     Range of Motion     AROM: Cervical/Thoracic Spine       Cervical Flexion 50    Cervical Extension 45    Thoracic Flexion     Thoracic Extension 10    (blank fields were intentionally left blank)         Left AROM Cervical/Thoracic    Right AROM   45  Cervical Lateral Flexion 35    75  Cervical Rotation 65      Thoracic Lateral Flexion       Thoracic Rotation     (blank fields were intentionally left blank)       Left AROM    Left PROM   Shoulder    Right AROM    Right PROM    WFL   Flexion    pain       Extension        WFL   Abduction    pain    WFL   Internal Rotation    pain    WFL   External Rotation    pain   (blank fields were intentionally left blank)  Tingling reported with L shoulder ER    Strength        Left Strength  Shoulder      Right   5  Neck Lateral Flexion        Shoulder Flexion 5      Shoulder Extension     5   Shoulder Abduction 5    4+  Shoulder IR 5    4+  Shoulder ER 5      Serratus       Rhomboids       Upper Trapezius     4-  Middle Trapezius 4    3+  Lower Trapezius 4-      Biceps       Triceps     (blank fields were intentionally left  blank)      Tests   Cervical     Left (Cervical)   Positive ULTT4.            Treatment     Therapeutic Exercises   Justification: To improve flexibility/ROM, strength, and endurance.    As marked in flowsheet        Neuromuscular Re-Education   Justification: Scapular stability with focus on scap depression, with proper cueing to reduce UT/scalene compensation   KT taping in X pattern for postural control    Manual Therapy   Justification: Improve thoracic mobility and reduce nerve tension  STM/DTM to L  subsap, infraspinatus, teres, rhomboid, cervical pvms, jaw, UT in supine  STM/DTM to  thoracic pvms, middle trap in prone         ---      ---   Total Time   Timed Minutes  43 minutes   Total Time  43 minutes        Assessment   Did progress program last visit and performed less manual. Went back to working more through the cervical region this visit due to pt reports. Pt does report she has a test tomorrow so has been studying all weekend as well.  Trial of KT tape in x pattern to improve postural stabilty  Patient would benefit from continued skilled PT to  improve postural muscle strength to improve postural tolerance throughout workday.   Plan   Cont with POC. F/U with tolerance to KT tape      Goals    Goal 1:  Patient will demonstrate independence in prescribed HEP with proper form, sets and reps for safe discharge to an independent program.   Sessions:  16      Goal 2:  Demonstrate proper posture and body mechanics with sitting so patient can sit throughout workday and class wwithout difficulty   Sessions:  16      Goal 3:  Demonstrate proper posture and body mechanics with lifting at gym so pt does not reproduce symptoms   Sessions:  16      Goal 4:  Pt will improve scapular strength to 4+/5 to improve postural endurance   Sessions:  16                              Exercise Flow Sheet    Exercise Specifics 01/29/18 01/31/18 02/05/18 02/08/18 02/12/18       UBE    - - - - 2'/2'  lvl 3  LS        I's/T's/Y's    - - I's  2#  T's  1#  Y's  2x10  LS  nmr I's  2#  T's  1#  Y's  2x10  LS  nmr -       B ER    RTB  3x10  LS  nmr   GTB 10x2 jm GTB  10x2  LS GTB  10x3  LS -       Pro/ret    table  Pro/ret  2x10  LS  nmr 10x2 jm 10x2  RTB  LS 10x3  RTB  3x10  LS -       POE    RTB  10''x5  LS  nmr -- - - -       Rows/PD    black 30x ea  LS  nmr Blk tube 10x2 jm blk tub  10x3 ea  LS black tub 10x3 ea  LS -       alt hor abd    RTB  2x10  LS  nmr -- - - -            supin eon 1/2 foam: H Abbd RTB 10x2 jm    flexion RTB 15x jm - - -             Prone function IR to ER  15x  LS - -             quadruped thoracic rot  2x10B  LS - quadruped thoracic rot  2x10B  LS               Cat/cow  20x  LS         Home Exercise Program               (Initials = supervised exercise by clinician)        Magdalene River, PT

## 2018-02-15 ENCOUNTER — Inpatient Hospital Stay: Payer: Commercial Managed Care - POS | Attending: Family

## 2018-02-15 DIAGNOSIS — M546 Pain in thoracic spine: Secondary | ICD-10-CM | POA: Insufficient documentation

## 2018-02-15 NOTE — PT/OT Therapy Note (Signed)
Name: Tracey Burns Age: 32 y.o.   Referring Physician: Merri Ray*   Date of Injury: 07/20/2017  Date Care Plan Established/Reviewed: 01/25/2018  Date Treatment Started: 01/25/2018  Visit Count: 7   Diagnosis:   1. Acute left-sided thoracic back pain        Subjective     Outcome Measure   Tool Used/Details: FOTO  Score: 76  Predicted Functional Outcome: 67    Social Support/Occupation      Occupation: translator/student    Patient reports that overall she is better but still has "flare ups" - but the pain is less frequent and not feeling it in fingers as much when the pain does come. Notices a big difference in her flexibility when she's working out. Thought the ktape was helpful with maintaining proper posture - just took it off yesterday.                Objective     Range of Motion     AROM: Cervical/Thoracic Spine       Cervical Flexion 50    Cervical Extension 45    Thoracic Flexion     Thoracic Extension 10    (blank fields were intentionally left blank)         Left AROM Cervical/Thoracic    Right AROM   45  Cervical Lateral Flexion 35    75  Cervical Rotation 65      Thoracic Lateral Flexion       Thoracic Rotation     (blank fields were intentionally left blank)       Left AROM    Left PROM   Shoulder    Right AROM    Right PROM    WFL   Flexion    pain       Extension        WFL   Abduction    pain    [facility]   Internal Rotation    pain    WFL   External Rotation    pain   (blank fields were intentionally left blank)  Tingling reported with L shoulder ER    Strength     initial eval L 02/15/18  Left Strength  Shoulder   initial eval R 02/15/18  Right   5  Neck Lateral Flexion      4+  Shoulder Flexion 5 5-    4+ Shoulder Extension  5   5 5   Shoulder Abduction 5 5   4+ 5 Shoulder IR 5 5   4+ 4+ Shoulder ER 5 5     Serratus       Rhomboids       Upper Trapezius     4- 4+ Middle Trapezius 4 4   3+ 4  Lower Trapezius 4- 4     Biceps       Triceps     (blank fields were intentionally left blank)      Tests   Cervical     Left (Cervical)   Positive ULTT4.            Treatment     Therapeutic Exercises   Justification: To improve flexibility/ROM, strength, and endurance.    As marked in flowsheet  Assess MMT    Neuromuscular Re-Education   Justification: To improve scapular stabilization and postural muscle recruitment; promote optimal body awareness/mechanics with TEs    As marked in flowsheet  Tactile cues to improve scap stab engagement  Manual Therapy   Justification: Improve thoracic mobility, decrease soft tissue tightness and reduce nerve tension  STM/DTM subscap, infraspinatus, UT, rhomboids/mid trap, borders of scap  PA mobs at thoracic spine grade 1-3  Scapular mobilization: inferior and upward rotation glides; attempted distraction  Subscap release with and without AROM ER       ---      ---   Total Time   Timed Minutes  43 minutes   Total Time  43 minutes        Assessment   Patient progressing well per subjective and MMT reveals improvement with mid and lower trap strength since [facility]. Decreased mobility throughout thoracic spine and scapulothoracic joint - responded well to manual with increased mobility following mobs and ability to perform thoracic rotation in q-ped with less discomfort. Patient remains very tender with STM to subscap and thoracic PVMs. Focus of exercises on addressing scapular muscle engagement - continues to require mod-max cues to minimize UT compensation so reiterated importance of intermittent "posture checks" at work and with gym routine.   Patient would benefit from continued skilled PT to improve thoracic mobility and scap muscle strength to improve tolerance sitting and working out.  Plan   Cont with POC. Progress as tolerated with emphasis on form. Reapply ktape for posture or thoracic PVM inhibition if indicated next visit.      Goals    Goal 1:  Patient will  demonstrate independence in prescribed HEP with proper form, sets and reps for safe discharge to an independent program.   Sessions:  16      Goal 2:  Demonstrate proper posture and body mechanics with sitting so patient can sit throughout workday and class wwithout difficulty  02/15/18: improved sitting tolerance and working out per subjective jm   Sessions:  16      Goal 3:  Demonstrate proper posture and body mechanics with lifting at gym so pt does not reproduce symptoms   Sessions:  16      Goal 4:  Pt will improve scapular strength to 4+/5 to improve postural endurance  02/15/18: progressing with mid and lower trap strength jm   Sessions:  16                              Exercise Flow Sheet    Exercise Specifics 01/29/18 01/31/18 02/05/18 02/08/18 02/12/18 02/15/18      UBE    - - - - 2'/2'  lvl 3  LS --      I's/T's/Y's    - - I's  2#  T's  1#  Y's  2x10  LS  nmr I's  2#  T's  1#  Y's  2x10  LS  nmr - --      B ER    RTB  3x10  LS  nmr   GTB 10x2 jm GTB  10x2  LS GTB  10x3  LS - --      Pro/ret    table  Pro/ret  2x10  LS  nmr 10x2 jm 10x2  RTB  LS 10x3  RTB  3x10  LS - Table push up 5" hold 10x jm      POE    RTB  10''x5  LS  nmr -- - - - POE + ER iso 5" hold 5x3  jm      Rows/PD    black 30x ea  LS  nmr Blk  tube 10x2 jm blk tub  10x3 ea  LS black tub 10x3 ea  LS - --      alt hor abd    RTB  2x10  LS  nmr -- - - - ---           supin eon 1/2 foam: H Abbd RTB 10x2 jm    flexion RTB 15x jm - - - supin eon 1/2 foam: H Abbd GTB 10x2 jm    flexion GTB 20x jm    D2 GTB 10x2 B jm            Prone function IR to ER  15x  LS - - -            quadruped thoracic rot  2x10B  LS - quadruped thoracic rot  2x10B  LS 10x B jm              Cat/cow  20x  LS   -      Home Exercise Program               (Initials = supervised exercise by clinician)        Georga Hacking, LPTA

## 2018-02-19 ENCOUNTER — Inpatient Hospital Stay: Payer: Commercial Managed Care - POS | Attending: Family

## 2018-02-19 DIAGNOSIS — M546 Pain in thoracic spine: Secondary | ICD-10-CM | POA: Insufficient documentation

## 2018-02-19 NOTE — PT/OT Therapy Note (Signed)
Name: Tracey Burns Age: 32 y.o.   Referring Physician: Merri Ray*   Date of Injury: 07/20/2017  Date Care Plan Established/Reviewed: 01/25/2018  Date Treatment Started: 01/25/2018  Visit Count: 8   Diagnosis:   1. 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 Have not been doing the thing that collarbone is going to break itself. Little more sore at L UT area.  Was pretty sore after last session.                Objective     Range of Motion     AROM: Cervical/Thoracic Spine       Cervical Flexion 50    Cervical Extension 45    Thoracic Flexion     Thoracic Extension 10    (blank fields were intentionally left blank)         Left AROM Cervical/Thoracic    Right AROM   45  Cervical Lateral Flexion 35    75  Cervical Rotation 65      Thoracic Lateral Flexion       Thoracic Rotation     (blank fields were intentionally left blank)       Left AROM    Left PROM   Shoulder    Right AROM    Right PROM    WFL   Flexion    pain       Extension        WFL   Abduction    pain    [facility]   Internal Rotation    pain    WFL   External Rotation    pain   (blank fields were intentionally left blank)  Tingling reported with L shoulder ER    Strength     initial eval L 02/15/18  Left Strength  Shoulder   initial eval R 02/15/18  Right   5  Neck Lateral Flexion 5    4+ 5-  Shoulder Flexion 5 5    4+ Shoulder Extension  5   5 5   Shoulder Abduction 5 5   4+ 5 Shoulder IR 5 5   4+ 4+ Shoulder ER 5 5     Serratus       Rhomboids       Upper Trapezius     4- 4+ Middle Trapezius 4 4   3+ 4 Lower Trapezius 4- 4     Biceps       Triceps     (blank fields were intentionally left blank)      Tests   Cervical     Left  (Cervical)   Positive ULTT4.            Treatment     Therapeutic Exercises   Justification: To improve flexibility/ROM, strength, and endurance.    As marked in flowsheet  Assess MMT    Neuromuscular Re-Education   Justification: To improve scapular stabilization and postural muscle recruitment; promote optimal body awareness/mechanics with TEs    As marked in flowsheet  Tactile cues to improve scap stab engagement    KT taping in X pattern for postural awareness and stability    Manual Therapy   Justification: Improve thoracic mobility, decrease soft tissue tightness  and reduce nerve tension  STM/DTM to cervical pvms, suboccipital jaw, B UT, levator in supine  STM to L periscapular area and infraspinatus in SL       ---      ---   Total Time   Timed Minutes  43 minutes   Total Time  43 minutes        Assessment   Min progression today due to pt reports-added eye gazing today which was challenging. Improved tone in thoracic region this visits. Did reappky KT tape today as pt reports this was beneficial before. Reassess strength today-progressing as expected with shoulder and scapular strength. Will continue to progress scapular and postural stabilization as able.  Patient would benefit from continued skilled PT to improve thoracic mobility and scap muscle strength to improve tolerance sitting and working out.  Plan   Cont with POC. Progress as tolerated with emphasis on form. Reapply ktape for posture or thoracic PVM inhibition if indicated next visit.      Goals    Goal 1:  Patient will demonstrate independence in prescribed HEP with proper form, sets and reps for safe discharge to an independent program.  02/19/18 reports compliance   Sessions:  16   Progression:  progressing      Goal 2:  Demonstrate proper posture and body mechanics with sitting so patient can sit throughout workday and class wwithout difficulty  02/15/18: improved sitting tolerance and working out per subjective jm   Sessions:  16   Progression:   progressing       Goal 3:  Demonstrate proper posture and body mechanics with lifting at gym so pt does not reproduce symptoms   Sessions:  16      Goal 4:  Pt will improve scapular strength to 4+/5 to improve postural endurance  02/19/18: progressing with mid and lower trap strength    Sessions:  16   Progression:  progressing                              Exercise Flow Sheet    Exercise Specifics 01/29/18 01/31/18 02/05/18 02/08/18 02/12/18 02/15/18 02/19/18     UBE    - - - - 2'/2'  lvl 3  LS -- -     I's/T's/Y's    - - I's  2#  T's  1#  Y's  2x10  LS  nmr I's  2#  T's  1#  Y's  2x10  LS  nmr - -- -     B ER    RTB  3x10  LS  nmr   GTB 10x2 jm GTB  10x2  LS GTB  10x3  LS - -- -     Pro/ret    table  Pro/ret  2x10  LS  nmr 10x2 jm 10x2  RTB  LS 10x3  RTB  3x10  LS - Table push up 5" hold 10x jm Table push up 5" hold 10x  LS  nmr     POE    RTB  10''x5  LS  nmr -- - - - POE + ER iso 5" hold 5x3  jm POE + ER iso with eye moveemnts  3 min  LS     Rows/PD    black 30x ea  LS  nmr Blk tube 10x2 jm blk tub  10x3 ea  LS black tub 10x3 ea  LS - -- -  alt hor abd    RTB  2x10  LS  nmr -- - - - --- -          supin eon 1/2 foam: H Abbd RTB 10x2 jm    flexion RTB 15x jm - - - supin eon 1/2 foam: H Abbd GTB 10x2 jm    flexion GTB 20x jm    D2 GTB 10x2 B jm supin eon 1/2 foam: H Abbd GTB 10x2     flexion GTB 20x      D2 GTB  10x2  LS           Prone function IR to ER  15x  LS - - - -           quadruped thoracic rot  2x10B  LS - quadruped thoracic rot  2x10B  LS 10x B jm quadruped thoracic rot  10xB  LS             Cat/cow  20x  LS   - -     Home Exercise Program               (Initials = supervised exercise by clinician)        Magdalene River, PT

## 2018-02-22 ENCOUNTER — Inpatient Hospital Stay: Payer: Commercial Managed Care - POS | Attending: Family

## 2018-02-22 DIAGNOSIS — M546 Pain in thoracic spine: Secondary | ICD-10-CM | POA: Insufficient documentation

## 2018-02-22 NOTE — PT/OT Therapy Note (Signed)
Name: Tracey Burns Age: 32 y.o.   Referring Physician: Merri Ray*   Date of Injury: 07/20/2017  Date Care Plan Established/Reviewed: 01/25/2018  Date Treatment Started: 01/25/2018  Visit Count: 9   Diagnosis:   1. Acute left-sided thoracic back pain        Subjective     Outcome Measure   Tool Used/Details: FOTO  Score: 76  Predicted Functional Outcome: 25    Social Support/Occupation      Occupation: translator/student    Patient reports she has actually been feeling pretty good. Was able to do a workout without issues. Not feeling the tightness like she was.                Objective     Range of Motion     AROM: Cervical/Thoracic Spine       Cervical Flexion 50    Cervical Extension 45    Thoracic Flexion     Thoracic Extension 10    (blank fields were intentionally left blank)         Left AROM Cervical/Thoracic    Right AROM   45  Cervical Lateral Flexion 35    75  Cervical Rotation 65      Thoracic Lateral Flexion       Thoracic Rotation     (blank fields were intentionally left blank)       Left AROM    Left PROM   Shoulder    Right AROM    Right PROM    WFL   Flexion    pain       Extension        WFL   Abduction    pain    [facility]   Internal Rotation    pain    WFL   External Rotation    pain   (blank fields were intentionally left blank)  Tingling reported with L shoulder ER    Strength     initial eval L 02/15/18  Left Strength  Shoulder   initial eval R 02/15/18  Right   5  Neck Lateral Flexion 5    4+ 5-  Shoulder Flexion 5 5    4+ Shoulder Extension  5   5 5   Shoulder Abduction 5 5   4+ 5 Shoulder IR 5 5   4+ 4+ Shoulder ER 5 5     Serratus       Rhomboids       Upper Trapezius     4- 4+ Middle Trapezius 4 4   3+ 4 Lower Trapezius 4- 4     Biceps       Triceps     (blank fields were intentionally left blank)      Tests   Cervical     Left (Cervical)   Positive ULTT4.             Treatment     Therapeutic Exercises   Justification: To improve flexibility/ROM, strength, and endurance.    As marked in flowsheet  Updated HEP      Neuromuscular Re-Education   Justification: To improve scapular stabilization and postural muscle recruitment; promote optimal body awareness/mechanics with TEs    As marked in flowsheet  Tactile cues to improve scap stab engagement and deep neck flexor control        Manual Therapy   Justification: Improve thoracic mobility, decrease soft tissue tightness and reduce nerve tension  STM/DTM to cervical pvms, suboccipital jaw,  B UT, levator in supine  STM to L periscapular area and infraspinatus in SL       ---      ---   Total Time   Timed Minutes  43 minutes   Total Time  43 minutes        Assessment   Tone continues to improve the patient has been tolerating exercises better so updated HEP to reflect this. Performed chin tuck exercises today which were very challenging-these were also added for home. Patient would benefit from continued skilled PT to improve thoracic mobility and scap muscle strength to improve tolerance sitting and working out.  Plan   Cont with POC.   10th visit: progress note      Goals    Goal 1:  Patient will demonstrate independence in prescribed HEP with proper form, sets and reps for safe discharge to an independent program.  02/19/18 reports compliance   Sessions:  16   Progression:  progressing      Goal 2:  Demonstrate proper posture and body mechanics with sitting so patient can sit throughout workday and class wwithout difficulty  02/15/18: improved sitting tolerance and working out per subjective jm   Sessions:  16   Progression:  progressing       Goal 3:  Demonstrate proper posture and body mechanics with lifting at gym so pt does not reproduce symptoms   Sessions:  16      Goal 4:  Pt will improve scapular strength to 4+/5 to improve postural endurance  02/19/18: progressing with mid and lower trap strength    Sessions:  16    Progression:  progressing                              Exercise Flow Sheet    Exercise Specifics 01/29/18 01/31/18 02/05/18 02/08/18 02/12/18 02/15/18 02/19/18 02/22/18    UBE    - - - - 2'/2'  lvl 3  LS -- - 2'/2'  lvl 3  LS    I's/T's/Y's    - - I's  2#  T's  1#  Y's  2x10  LS  nmr I's  2#  T's  1#  Y's  2x10  LS  nmr - -- - T's/W's  2x10  LS    B ER    RTB  3x10  LS  nmr   GTB 10x2 jm GTB  10x2  LS GTB  10x3  LS - -- - -    Pro/ret    table  Pro/ret  2x10  LS  nmr 10x2 jm 10x2  RTB  LS 10x3  RTB  3x10  LS - Table push up 5" hold 10x jm Table push up 5" hold 10x  LS  nmr Table push up 5" hold 10x  LS  nmr    POE    RTB  10''x5  LS  nmr -- - - - POE + ER iso 5" hold 5x3  jm POE + ER iso with eye moveemnts  3 min  LS -    Rows/PD    black 30x ea  LS  nmr Blk tube 10x2 jm blk tub  10x3 ea  LS black tub 10x3 ea  LS - -- - -    alt hor abd    RTB  2x10  LS  nmr -- - - - --- - -  supin eon 1/2 foam: H Abbd RTB 10x2 jm    flexion RTB 15x jm - - - supin eon 1/2 foam: H Abbd GTB 10x2 jm    flexion GTB 20x jm    D2 GTB 10x2 B jm supin eon 1/2 foam: H Abbd GTB 10x2     flexion GTB 20x      D2 GTB  10x2  LS -          Prone function IR to ER  15x  LS - - - - -          quadruped thoracic rot  2x10B  LS - quadruped thoracic rot  2x10B  LS 10x B jm quadruped thoracic rot  10xB  LS 2x10B  LS            Cat/cow  20x  LS   - - chin tuck lift 5''x10    Quadruped chin tuck 5''x10  LS    Home Exercise Program               (Initials = supervised exercise by clinician)  Access Code: [email or web address]   URL: [email or web address]   Date: 02/22/2018   Prepared by: Magdalene River     Exercises  Single Arm Doorway Pec Stretch at 90 Degrees Abduction - 10 reps - 1 sets - 1x daily - 7x weekly  Quadruped Thoracic Rotation Full Range with Hand on Neck - 10 reps - 2 sets - 1x daily - 7x  weekly  Supine Deep Neck Flexor Training - Repetitions - 10 reps - 1 sets - 5 hold - 1x daily - 7x weekly  Quadruped Cervical Retraction - 10 reps - 1 sets - 5 hold - 1x daily                            - 7x weekly  Push Up on Table - 10 reps - 2 sets - 5 hold - 1x daily - 7x weekly  Prone Shoulder Horizontal Abduction - 10 reps - 2 sets - 1x daily - 7x weekly  Prone W Scapular Retraction - 10 reps - 2 sets - 1x daily - 7x weekly      Magdalene River, PT

## 2018-03-06 ENCOUNTER — Inpatient Hospital Stay: Payer: Commercial Managed Care - POS | Attending: Family

## 2018-03-06 DIAGNOSIS — M546 Pain in thoracic spine: Secondary | ICD-10-CM | POA: Insufficient documentation

## 2018-03-06 NOTE — PT/OT Therapy Note (Signed)
Name: Tracey Burns Age: 32 y.o.   Referring Physician: Merri Ray*   Date of Injury: 07/20/2017  Date Care Plan Established/Reviewed: 01/25/2018  Date Treatment Started: 01/25/2018  Visit Count: 10   Diagnosis:   1. Acute left-sided thoracic back pain        Subjective     Outcome Measure   Tool Used/Details: FOTO  Score: 76  Predicted Functional Outcome: 7    Social Support/Occupation      Occupation: translator/student    Patient reports that she was doing really well, no problems with sitting at work or working out but when she had a grocery bag on her shoulder for a long period of time the pain in the L shoulder/armpit and pec muscles returned.                Objective     Range of Motion     AROM: Cervical/Thoracic Spine       Cervical Flexion 50    Cervical Extension 45    Thoracic Flexion     Thoracic Extension 10    (blank fields were intentionally left blank)         Left AROM Cervical/Thoracic    Right AROM   45  Cervical Lateral Flexion 35    75  Cervical Rotation 65      Thoracic Lateral Flexion       Thoracic Rotation     (blank fields were intentionally left blank)     IE  Left AROM   Shoulder IE  Right AROM 03/06/18 R AROM    WFL Flexion pain 175     Extension      WFL Abduction pain 180    WFL Internal Rotation pain 80    WFL External Rotation pain 90   (blank fields were intentionally left blank)  Tingling reported with L shoulder ER    Strength     initial eval L 02/15/18  Left Strength  Shoulder   initial eval R 02/15/18  Right   5  Neck Lateral Flexion 5    4+ 5-  Shoulder Flexion 5 5    4+ Shoulder Extension  5   5 5   Shoulder Abduction 5 5   4+ 5 Shoulder IR 5 5   4+ 4+ Shoulder ER 5 5     Serratus       Rhomboids       Upper Trapezius     4- 4+ Middle Trapezius 4 4   3+ 4 Lower Trapezius 4- 4     Biceps       Triceps     (blank fields were intentionally left blank)      Tests   Cervical      Left (Cervical)   Positive ULTT4.            Treatment     Therapeutic Exercises   Justification: To improve flexibility/ROM, strength, and endurance.    As marked in flowsheet  Updated HEP  Manual stretch UTs  TEs on 1/2 foam     Neuromuscular Re-Education   Justification: To improve scapular stabilization and postural muscle recruitment; promote optimal body awareness/mechanics with TEs    As marked in flowsheet  Tactile cues to improve scap stab engagement and deep neck flexor control        Manual Therapy   Justification: Improve thoracic mobility, decrease soft tissue tightness and reduce nerve tension  STM/DTM subscap, infraspinatus, UT, rhomboids/mid trap, borders  of scap  PA mobs at thoracic spine grade 1-3  L Scapular mobilization: inferior and upward rotation glides; attempted distraction  L Subscap release with and without AROM ER and flexion       ---      ---   Total Time   Timed Minutes  45 minutes   Total Time  45 minutes        Assessment   Patient responding well to PT intervention overall per subjective re: improved tolerance with daily activities although intermittent flare-ups continue with prolonged positioning and/or additional weight (i.e purse or groceries). Patient presents with full ROM of B shoulders and no longer has pain with reaching [facility] which is improved compared to IE. Continued to focus exercises on stabilization and endurance exercises - patient continues to be challenged with SA activation and maintaining proper scap positioning throughout reps but no pain with TEs. Mod cues still needed for body awareness and proper form. Patient would benefit from continued skilled PT to optimize tolerance with carrying bags, prolonged positioning, and maximize safety with lifting/workouts.  Plan   Cont with POC.       Goals    Goal 1:  Patient will demonstrate independence in prescribed HEP with proper form, sets and reps for safe discharge to an independent program.  02/19/18 reports  compliance   Sessions:  16   Progression:  progressing      Goal 2:  Demonstrate proper posture and body mechanics with sitting so patient can sit throughout workday and class wwithout difficulty  02/15/18: improved sitting tolerance and working out per subjective jm   Sessions:  16   Progression:  progressing       Goal 3:  Demonstrate proper posture and body mechanics with lifting at gym so pt does not reproduce symptoms  03/06/18: progressing with function and posture   Sessions:  16   Progression:  progressing      Goal 4:  Pt will improve scapular strength to 4+/5 to improve postural endurance  02/19/18: progressing with mid and lower trap strength    Sessions:  16   Progression:  progressing                              Exercise Flow Sheet    Exercise Specifics 01/29/18 01/31/18 02/05/18 02/08/18 02/12/18 02/15/18 02/19/18 02/22/18 03/06/18   UBE    - - - - 2'/2'  lvl 3  LS -- - 2'/2'  lvl 3  LS -   I's/T's/Y's    - - I's  2#  T's  1#  Y's  2x10  LS  nmr I's  2#  T's  1#  Y's  2x10  LS  nmr - -- - T's/W's  2x10  LS    B ER    RTB  3x10  LS  nmr   GTB 10x2 jm GTB  10x2  LS GTB  10x3  LS - -- - -    Pro/ret    table  Pro/ret  2x10  LS  nmr 10x2 jm 10x2  RTB  LS 10x3  RTB  3x10  LS - Table push up 5" hold 10x jm Table push up 5" hold 10x  LS  nmr Table push up 5" hold 10x  LS  nmr Table push up RTB at wrist 3" hold 10x2 jm  nmr   POE    RTB  10''x5  LS  nmr -- - - - POE + ER iso 5" hold 5x3  jm POE + ER iso with eye moveemnts  3 min  LS - 3 month prone 1 min jm  nmr    Side plank o nknees 10" hold 4x - pain, irritated jm    Rows/PD    black 30x ea  LS  nmr Blk tube 10x2 jm blk tub  10x3 ea  LS black tub 10x3 ea  LS - -- - - SA rolls on foam roller 2# 10x2 jm   alt hor abd    RTB  2x10  LS  nmr -- - - - --- - -         supin eon 1/2 foam: H Abbd RTB 10x2 jm    flexion RTB 15x jm - - - supin eon 1/2 foam: H Abbd GTB 10x2 jm    flexion  GTB 20x jm    D2 GTB 10x2 B jm supin eon 1/2 foam: H Abbd GTB 10x2     flexion GTB 20x      D2 GTB  10x2  LS - supine on 1/2 foam: H Abd RTB 10x2 jm    SA punch GTB 20x jm         Prone function IR to ER  15x  LS - - - - - plank on BOSU shifts 5x3 jm  nmr         quadruped thoracic rot  2x10B  LS - quadruped thoracic rot  2x10B  LS 10x B jm quadruped thoracic rot  10xB  LS 2x10B  LS --           Cat/cow  20x  LS   - - chin tuck lift 5''x10    Quadruped chin tuck 5''x10  LS --   Home Exercise Program               (Initials = supervised exercise by clinician)  Access Code: [email or web address]   URL: [email or web address]   Date: 02/22/2018   Prepared by: Magdalene River     Exercises  Single Arm Doorway Pec Stretch at 90 Degrees Abduction - 10 reps - 1 sets - 1x daily - 7x weekly  Quadruped Thoracic Rotation Full Range with Hand on Neck - 10 reps - 2 sets - 1x daily - 7x weekly  Supine Deep Neck Flexor Training - Repetitions - 10 reps - 1 sets - 5 hold - 1x daily - 7x weekly  Quadruped Cervical Retraction - 10 reps - 1 sets - 5 hold - 1x daily - 7x weekly  Push Up on Table - 10 reps - 2 sets - 5 hold - 1x daily - 7x weekly  Prone Shoulder Horizontal Abduction - 10 reps - 2 sets - 1x daily - 7x weekly  Prone W Scapular Retraction - 10 reps - 2 sets - 1x daily - 7x weekly      Georga Hacking, LPTA

## 2018-03-07 NOTE — Progress Notes (Signed)
Name: Francine Hannan Age: 32 y.o.   Referring Physician: Merri Ray*   Date of Injury: 07/20/2017  Date Care Plan Established/Reviewed: 01/25/2018  Date Treatment Started: 01/25/2018  Visit Count: 10   Diagnosis:   1. Acute left-sided thoracic back pain      Subjective  Patient reports that she was doing really well, no problems with sitting at work or working out but when she had a grocery bag on her shoulder for a long period of time the pain in the L shoulder/armpit and pec muscles returned.       Objective      Range of Motion     AROM: Cervical/Thoracic Spine       Cervical Flexion 50    Cervical Extension 45    Thoracic Flexion     Thoracic Extension 10    (blank fields were intentionally left blank)         Left AROM Cervical/Thoracic    Right AROM   45  Cervical Lateral Flexion 35    75  Cervical Rotation 65      Thoracic Lateral Flexion       Thoracic Rotation     (blank fields were intentionally left blank)     IE  Left AROM   Shoulder IE  Right AROM 03/06/18 R AROM    WFL Flexion pain 175     Extension      WFL Abduction pain 180    WFL Internal Rotation pain 80    WFL External Rotation pain 90   (blank fields were intentionally left blank)  Tingling reported with L shoulder ER    Strength     initial eval L 02/15/18  Left Strength  Shoulder   initial eval R 02/15/18  Right   5  Neck Lateral Flexion 5    4+ 5-  Shoulder Flexion 5 5    4+ Shoulder Extension  5   5 5   Shoulder Abduction 5 5   4+ 5 Shoulder IR 5 5   4+ 4+ Shoulder ER 5 5     Serratus       Rhomboids       Upper Trapezius     4- 4+ Middle Trapezius 4 4   3+ 4 Lower Trapezius 4- 4     Biceps       Triceps     (blank fields were intentionally left blank)    Assessment & Plan.  Patient responding well to PT intervention overall per subjective re: improved tolerance with daily activities although intermittent flare-ups continue with prolonged  positioning and/or additional weight (i.e purse or groceries). Patient presents with full ROM of B shoulders and no longer has pain with reaching [facility] which is improved compared to IE. Strength and stabilization deficits remain in RTB and postural muscles but improved since SOC. Patient would benefit from continued skilled PT to optimize tolerance with carrying bags, prolonged positioning, and maximize safety with lifting/workouts.                            Goals    Goal 1:  Patient will demonstrate independence in prescribed HEP with proper form, sets and reps for safe discharge to an independent program.  02/19/18 reports compliance   Sessions:  16   Progression:  progressing      Goal 2:  Demonstrate proper posture and body mechanics with sitting so patient can sit throughout workday  and class wwithout difficulty  02/15/18: improved sitting tolerance and working out per subjective jm   Sessions:  16   Progression:  progressing       Goal 3:  Demonstrate proper posture and body mechanics with lifting at gym so pt does not reproduce symptoms  03/06/18: progressing with function and posture   Sessions:  16   Progression:  progressing      Goal 4:  Pt will improve scapular strength to 4+/5 to improve postural endurance  02/19/18: progressing with mid and lower trap strength    Sessions:  16   Progression:  progressing                                Georga Hacking, LPTA

## 2018-03-09 NOTE — Progress Notes (Signed)
Name: Tracey Burns Age: 32 y.o.   Referring Physician: Merri Ray*   Date of Injury: 07/20/2017  Date Care Plan Established/Reviewed: 01/25/2018  Date Treatment Started: 01/25/2018  Visit Count: 10   Diagnosis:   1. Acute left-sided thoracic back pain      Subjective  Patient reports that she was doing really well, no problems with sitting at work or working out but when she had a grocery bag on her shoulder for a long period of time the pain in the L shoulder/armpit and pec muscles returned.       Objective      Range of Motion     AROM: Cervical/Thoracic Spine       Cervical Flexion 50    Cervical Extension 45    Thoracic Flexion     Thoracic Extension 10    (blank fields were intentionally left blank)         Left AROM Cervical/Thoracic    Right AROM   45  Cervical Lateral Flexion 35    75  Cervical Rotation 65      Thoracic Lateral Flexion       Thoracic Rotation     (blank fields were intentionally left blank)     IE  Left AROM   Shoulder IE  Right AROM 03/06/18 R AROM    WFL Flexion pain 175     Extension      WFL Abduction pain 180    WFL Internal Rotation pain 80    WFL External Rotation pain 90   (blank fields were intentionally left blank)  Tingling reported with L shoulder ER    Strength     initial eval L 02/15/18  Left Strength  Shoulder   initial eval R 02/15/18  Right   5  Neck Lateral Flexion 5    4+ 5- Shoulder Flexion 5 5    4+ Shoulder Extension  5   5 5  Shoulder Abduction 5 5   4+ 5 Shoulder IR 5 5   4+ 4+ Shoulder ER 5 5     Serratus       Rhomboids       Upper Trapezius     4- 4+ Middle Trapezius 4 4   3+ 4 Lower Trapezius 4- 4     Biceps       Triceps     (blank fields were intentionally left blank)    Assessment & Plan.  Patient responding well to PT intervention overall per subjective re: improved tolerance with daily activities although intermittent flare-ups continue with prolonged  positioning and/or additional weight (i.e purse or groceries). Patient presents with full ROM of B shoulders and no longer has pain with reaching [facility] which is improved compared to IE. Strength and stabilization deficits remain in RTB and postural muscles but improved since SOC. Patient would benefit from continued skilled PT to optimize tolerance with carrying bags, prolonged positioning, and maximize safety with lifting/workouts.                                   ---      ---   Total Time   Timed Minutes  45 minutes   Total Time  45 minutes           Goals    Goal 1:  Patient will demonstrate independence in prescribed HEP with proper form, sets and reps for safe discharge  to an independent program.  02/19/18 reports compliance   Sessions:  16   Progression:  progressing      Goal 2:  Demonstrate proper posture and body mechanics with sitting so patient can sit throughout workday and class wwithout difficulty  02/15/18: improved sitting tolerance and working out per subjective jm   Sessions:  16   Progression:  progressing       Goal 3:  Demonstrate proper posture and body mechanics with lifting at gym so pt does not reproduce symptoms  03/06/18: progressing with function and posture   Sessions:  16   Progression:  progressing      Goal 4:  Pt will improve scapular strength to 4+/5 to improve postural endurance  02/19/18: progressing with mid and lower trap strength    Sessions:  16   Progression:  progressing                                Magdalene River, PT

## 2018-03-15 ENCOUNTER — Inpatient Hospital Stay: Payer: Commercial Managed Care - POS | Attending: Family

## 2018-03-15 DIAGNOSIS — M546 Pain in thoracic spine: Secondary | ICD-10-CM | POA: Insufficient documentation

## 2018-03-15 NOTE — PT/OT Therapy Note (Signed)
Name: Tracey Burns Age: 32 y.o.   Referring Physician: Merri Ray*   Date of Injury: 07/20/2017  Date Care Plan Established/Reviewed: 01/25/2018  Date Treatment Started: 01/25/2018  Visit Count: 11   Diagnosis:   1. Acute left-sided thoracic back pain        Subjective     Outcome Measure   Tool Used/Details: FOTO  Score: 76  Predicted Functional Outcome: 53    Social Support/Occupation      Occupation: translator/student    Patient reports that she's been doing well with working out                 Objective     Range of Motion     AROM: Cervical/Thoracic Spine       Cervical Flexion 50    Cervical Extension 45    Thoracic Flexion     Thoracic Extension 10    (blank fields were intentionally left blank)         Left AROM Cervical/Thoracic    Right AROM   45  Cervical Lateral Flexion 35    75  Cervical Rotation 65      Thoracic Lateral Flexion       Thoracic Rotation     (blank fields were intentionally left blank)     IE  Left AROM   Shoulder IE  Right AROM 03/06/18 R AROM    WFL Flexion pain 175     Extension      WFL Abduction pain 180    WFL Internal Rotation pain 80    WFL External Rotation pain 90   (blank fields were intentionally left blank)  Tingling reported with L shoulder ER    Strength     initial eval L 02/15/18  Left 03/15/18 Left Strength  Shoulder   initial eval R 02/15/18  Right 03/15/18 Right   5   Neck Lateral Flexion 5     4+ 5-   Shoulder Flexion 5 5     4+  Shoulder Extension  5    5 5    Shoulder Abduction 5 5    4+ 5  Shoulder IR 5 5    4+ 4+  Shoulder ER 5 5       Serratus         Rhomboids         Upper Trapezius      4- 4+ 4+ Middle Trapezius 4 4 4+   3+ 4 4 Lower Trapezius 4- 4 4      Biceps         Triceps      (blank fields were intentionally left blank)      Tests   Cervical     Left (Cervical)   Positive ULTT4.            Treatment     Therapeutic Exercises   Justification: To improve  flexibility/ROM, strength, and endurance.    As marked in flowsheet  Updated HEP  Manual stretch UTs  TEs on 1/2 foam     Neuromuscular Re-Education   Justification: To improve scapular stabilization and postural muscle recruitment; promote optimal body awareness/mechanics with TEs    As marked in flowsheet  Tactile cues to improve scap stab engagement and deep neck flexor control        Manual Therapy   Justification: Improve thoracic mobility, decrease soft tissue tightness and reduce nerve tension  STM/DTM subscap, infraspinatus, UT, rhomboids/mid trap, borders of scap  PA mobs at thoracic spine grade 1-3  L Scapular mobilization: inferior and upward rotation glides; attempted distraction  L Subscap release with and without AROM ER and flexion       ---      ---   Total Time   Timed Minutes  43 minutes   Total Time  43 minutes        Assessment   Patient continues to progress per subjective although strength deficits of lower and mid traps remain.Continue to focus exercises on stabilization and thoracic mobility to improve tolerance with running and lifting. Additional exercises introduced with increased emphasis on push/pull mechanics to ensure optimal form and promote core and brace with lifting/functional activities. Minimal cues needed as patient demonstrates better awareness and ability to minimize compensation independently. Poor thoracic mobility noted during manual to 1/2 foam roll exercises performed.   Patient would benefit from continued skilled PT to optimize tolerance with carrying bags, prolonged positioning, and maximize safety with lifting/workouts.  Plan   Cont with POC.       Goals    Goal 1:  Patient will demonstrate independence in prescribed HEP with proper form, sets and reps for safe discharge to an independent program.  02/19/18 reports compliance   Sessions:  16   Progression:  progressing      Goal 2:  Demonstrate proper posture and body mechanics with sitting so patient can sit  throughout workday and class wwithout difficulty  02/15/18: improved sitting tolerance and working out per subjective jm   Sessions:  16   Progression:  progressing       Goal 3:  Demonstrate proper posture and body mechanics with lifting at gym so pt does not reproduce symptoms  03/06/18: progressing with function and posture   Sessions:  16   Progression:  progressing      Goal 4:  Pt will improve scapular strength to 4+/5 to improve postural endurance  02/19/18: progressing with mid and lower trap strength    Sessions:  16   Progression:  progressing                              Exercise Flow Sheet    Exercise Specifics 01/29/18 01/31/18 02/05/18 02/08/18 02/12/18 02/15/18 02/19/18 02/22/18 03/06/18 03/15/18   UBE    - - - - 2'/2'  lvl 3  LS -- - 2'/2'  lvl 3  LS - --   I's/T's/Y's    - - I's  2#  T's  1#  Y's  2x10  LS  nmr I's  2#  T's  1#  Y's  2x10  LS  nmr - -- - T's/W's  2x10  LS  --   B ER    RTB  3x10  LS  nmr   GTB 10x2 jm GTB  10x2  LS GTB  10x3  LS - -- - -  Deep neck flexor endurance 10" 10x jm   Pro/ret    table  Pro/ret  2x10  LS  nmr 10x2 jm 10x2  RTB  LS 10x3  RTB  3x10  LS - Table push up 5" hold 10x jm Table push up 5" hold 10x  LS  nmr Table push up 5" hold 10x  LS  nmr Table push up RTB at wrist 3" hold 10x2 jm  nmr --   POE    RTB  10''x5  LS  nmr -- - - -  POE + ER iso 5" hold 5x3  jm POE + ER iso with eye moveemnts  3 min  LS - 3 month prone 1 min jm  nmr    Side plank o nknees 10" hold 4x - pain, irritated jm  POE + reach 10x B;     + push/pull 10xB jm   Rows/PD    black 30x ea  LS  nmr Blk tube 10x2 jm blk tub  10x3 ea  LS black tub 10x3 ea  LS - -- - - SA rolls on foam roller 2# 10x2 jm --   alt hor abd    RTB  2x10  LS  nmr -- - - - --- - -  shoulder flexion + twist 10# green jm        supin eon 1/2 foam: H Abbd RTB 10x2 jm    flexion RTB 15x jm - - - supin eon 1/2 foam: H Abbd GTB 10x2 jm    flexion GTB 20x  jm    D2 GTB 10x2 B jm supine on 1/2 foam: H Abbd GTB 10x2     flexion GTB 20x      D2 GTB  10x2  LS - supine on 1/2 foam: H Abd RTB 10x2 jm    SA punch GTB 20x jm supine on 1/2 foam: H Abd GTB 20x jm    shoulder flexion GTB 20x jm         Prone function IR to ER  15x  LS - - - - - plank on BOSU shifts 5x3 jm  nmr plank on BOSU shifts 5x3 jm  nmr         quadruped thoracic rot  2x10B  LS - quadruped thoracic rot  2x10B  LS 10x B jm quadruped thoracic rot  10xB  LS 2x10B  LS -- SFMA thoracic rot + ext 10x2 B jm           Cat/cow  20x  LS   - - chin tuck lift 5''x10    Quadruped chin tuck 5''x10  LS -- FMCC push 2 plates 42V9 jm    Pull 2 plates 56L8 jm   Home Exercise Program                (Initials = supervised exercise by clinician)  Access Code: [email or web address]   URL: [email or web address]   Date: 02/22/2018   Prepared by: Magdalene River     Exercises  Single Arm Doorway Pec Stretch at 90 Degrees Abduction - 10 reps - 1 sets - 1x daily - 7x weekly  Quadruped Thoracic Rotation Full Range with Hand on Neck - 10 reps - 2 sets - 1x daily - 7x weekly  Supine Deep Neck Flexor Training - Repetitions - 10 reps - 1 sets - 5 hold - 1x daily - 7x weekly  Quadruped Cervical Retraction - 10 reps - 1 sets - 5 hold - 1x daily - 7x weekly  Push Up on Table - 10 reps - 2 sets - 5 hold - 1x daily - 7x weekly  Prone Shoulder Horizontal Abduction - 10 reps - 2 sets - 1x daily - 7x weekly  Prone W Scapular Retraction - 10 reps - 2 sets - 1x daily - 7x weekly      Georga Hacking, LPTA

## 2018-03-20 ENCOUNTER — Inpatient Hospital Stay: Payer: Commercial Managed Care - POS | Attending: Family

## 2018-03-20 DIAGNOSIS — M546 Pain in thoracic spine: Secondary | ICD-10-CM | POA: Insufficient documentation

## 2018-03-20 NOTE — PT/OT Therapy Note (Signed)
Name: Tracey Burns Age: 32 y.o.   Referring Physician: Merri Ray*   Date of Injury: 07/20/2017  Date Care Plan Established/Reviewed: 01/25/2018  Date Treatment Started: 01/25/2018  Visit Count: 12   Diagnosis:   1. Acute left-sided thoracic back pain        Subjective     Outcome Measure   Tool Used/Details: FOTO  Score: 76  Predicted Functional Outcome: 36    Social Support/Occupation      Occupation: translator/student    Patient reports that she has been a little flared up in the L UT and back of shoulder as well as pectoralis muscle. Did do a lot of lifting and carrying and stacking chairs.                 Objective     Range of Motion     AROM: Cervical/Thoracic Spine       Cervical Flexion 50    Cervical Extension 45    Thoracic Flexion     Thoracic Extension 10    (blank fields were intentionally left blank)         Left AROM Cervical/Thoracic    Right AROM   45  Cervical Lateral Flexion 35    75  Cervical Rotation 65      Thoracic Lateral Flexion       Thoracic Rotation     (blank fields were intentionally left blank)     IE  Left AROM   Shoulder IE  Right AROM 03/06/18 R AROM    WFL Flexion pain 175     Extension      WFL Abduction pain 180    [facility] Internal Rotation pain 80    WFL External Rotation pain 90   (blank fields were intentionally left blank)  Tingling reported with L shoulder ER    Strength     initial eval L 02/15/18  Left 03/15/18 Left Strength  Shoulder   initial eval R 02/15/18  Right 03/15/18 Right   5   Neck Lateral Flexion 5     4+ 5-   Shoulder Flexion 5 5     4+  Shoulder Extension  5    5 5    Shoulder Abduction 5 5    4+ 5  Shoulder IR 5 5    4+ 4+  Shoulder ER 5 5       Serratus         Rhomboids         Upper Trapezius      4- 4+ 4+ Middle Trapezius 4 4 4+   3+ 4 4 Lower Trapezius 4- 4 4      Biceps         Triceps      (blank fields were intentionally left blank)           Treatment      Therapeutic Exercises   Justification: To improve flexibility/ROM, strength, and endurance.    As marked in flowsheet  Updated HEP  TEs on 1/2 foam     Neuromuscular Re-Education   Justification: To improve scapular stabilization and postural muscle recruitment; promote optimal body awareness/mechanics with TEs    As marked in flowsheet  Tactile cues to enhance muscle engagement and improve form        Manual Therapy   Justification: Improve thoracic mobility, decrease soft tissue tightness and reduce nerve tension  IASTM B thoracic PVMs  STM/DTM subscap, infraspinatus, UT, rhomboids/mid trap, borders  of scap  PA mobs at thoracic spine grade 1-3  L Scapular mobilization: inferior and upward rotation glides; attempted distraction  L Subscap release with and without AROM ER and flexion       ---      ---   Total Time   Timed Minutes  45 minutes   Total Time  45 minutes        Assessment   Patient arrives to PT with increased pain - significant restrictions noted with thoracic mobility and tone throughout L > R UTs and lower C/S PVMs. Responded very well to manual with improved tolerance to neck and trunk movement and decreased tone following. Review of seated posture to increase tolerance with workday and minimize reoccurrence of flare-ups - patient required minimal cues for alignment but overall demonstrates good understanding of ergonomics, needed modifications, and positioning for optimal spinal stability when sitting working on computer.  Patient would benefit from continued skilled PT to optimize tolerance with carrying bags, prolonged positioning, and maximize safety with lifting/workouts.  Plan   Cont with POC. Review lifting and pushing mechanics.      Goals    Goal 1:  Patient will demonstrate independence in prescribed HEP with proper form, sets and reps for safe discharge to an independent program.  02/19/18 reports compliance   Sessions:  16   Progression:  progressing      Goal 2:  Demonstrate proper  posture and body mechanics with sitting so patient can sit throughout workday and class wwithout difficulty  02/15/18: improved sitting tolerance and working out per subjective jm   Sessions:  16   Progression:  progressing       Goal 3:  Demonstrate proper posture and body mechanics with lifting at gym so pt does not reproduce symptoms  03/06/18: progressing with function and posture   Sessions:  16   Progression:  progressing      Goal 4:  Pt will improve scapular strength to 4+/5 to improve postural endurance  02/19/18: progressing with mid and lower trap strength    Sessions:  16   Progression:  progressing                              Exercise Flow Sheet    Exercise Specifics 01/29/18 01/31/18 02/05/18 02/08/18 02/12/18 02/15/18 02/19/18 02/22/18 03/06/18 03/15/18   UBE    - - - - 2'/2'  lvl 3  LS -- - 2'/2'  lvl 3  LS - --   I's/T's/Y's    - - I's  2#  T's  1#  Y's  2x10  LS  nmr I's  2#  T's  1#  Y's  2x10  LS  nmr - -- - T's/W's  2x10  LS  --   B ER    RTB  3x10  LS  nmr   GTB 10x2 jm GTB  10x2  LS GTB  10x3  LS - -- - -  Deep neck flexor endurance 10" 10x jm   Pro/ret    table  Pro/ret  2x10  LS  nmr 10x2 jm 10x2  RTB  LS 10x3  RTB  3x10  LS - Table push up 5" hold 10x jm Table push up 5" hold 10x  LS  nmr Table push up 5" hold 10x  LS  nmr Table push up RTB at wrist 3" hold 10x2 jm  nmr --   POE  RTB  10''x5  LS  nmr -- - - - POE + ER iso 5" hold 5x3  jm POE + ER iso with eye moveemnts  3 min  LS - 3 month prone 1 min jm  nmr    Side plank o nknees 10" hold 4x - pain, irritated jm  POE + reach 10x B;     + push/pull 10xB jm   Rows/PD    black 30x ea  LS  nmr Blk tube 10x2 jm blk tub  10x3 ea  LS black tub 10x3 ea  LS - -- - - SA rolls on foam roller 2# 10x2 jm --   alt hor abd    RTB  2x10  LS  nmr -- - - - --- - -  shoulder flexion + twist 10# green jm        supin eon 1/2 foam: H Abbd RTB 10x2 jm    flexion RTB 15x jm - - -  supin eon 1/2 foam: H Abbd GTB 10x2 jm    flexion GTB 20x jm    D2 GTB 10x2 B jm supine on 1/2 foam: H Abbd GTB 10x2     flexion GTB 20x      D2 GTB  10x2  LS - supine on 1/2 foam: H Abd RTB 10x2 jm    SA punch GTB 20x jm supine on 1/2 foam: H Abd GTB 20x jm    shoulder flexion GTB 20x jm         Prone function IR to ER  15x  LS - - - - - plank on BOSU shifts 5x3 jm  nmr plank on BOSU shifts 5x3 jm  nmr         quadruped thoracic rot  2x10B  LS - quadruped thoracic rot  2x10B  LS 10x B jm quadruped thoracic rot  10xB  LS 2x10B  LS -- SFMA thoracic rot + ext 10x2 B jm           Cat/cow  20x  LS   - - chin tuck lift 5''x10    Quadruped chin tuck 5''x10  LS -- FMCC push 2 plates 40J8 jm    Pull 2 plates 11B1 jm   Home Exercise Program                (Initials = supervised exercise by clinician)        Exercise Flow Sheet    Exercise Specifics 03/20/18               UBE  -             FMCC    -             plank    At table with alternating UE lift 20x jm B             POE    + reach  10x jm  Office set-up and siting posture    X 8 minutes  ta              Home Exercise Program    Updated jm             (Initials = supervised exercise by clinician)    Access Code: [email or web address]   URL: [email or web address]   Date: 03/20/2018   Prepared by: Edwina Barth     Exercises   Single Arm Doorway Pec Stretch at 90 Degrees Abduction - 10 reps - 1 sets - 1x daily - 7x weekly   Quadruped Thoracic Rotation Full Range with Hand on Neck - 10 reps - 2 sets - 1x daily - 7x weekly   Supine Deep Neck Flexor Training - Repetitions - 10 reps - 1 sets - 5 hold - 1x daily - 7x weekly   Quadruped Cervical Retraction - 10 reps - 1 sets - 5 hold - 1x daily - 7x weekly   Push Up on Table - 10 reps - 2 sets - 5 hold - 1x daily - 7x weekly   Prone Shoulder Horizontal Abduction - 10  reps - 2 sets - 1x daily - 7x weekly   Prone W Scapular Retraction - 10 reps - 2 sets - 1x daily - 7x weekly   Thoracic Mobilization on Foam Roll - 20 reps - 1x daily - 7x weekly   Hooklying Scapular Protraction on Foam Roll - 20 reps - 1x daily - 7x weekly   Overhead Reach on Foam Roll - 20 reps - 1x daily - 7x weekly     Georga Hacking, LPTA

## 2018-03-22 ENCOUNTER — Inpatient Hospital Stay: Payer: Commercial Managed Care - POS | Attending: Family

## 2018-03-22 DIAGNOSIS — M546 Pain in thoracic spine: Secondary | ICD-10-CM

## 2018-03-22 NOTE — PT/OT Therapy Note (Signed)
Name: Tracey Burns Age: 32 y.o.   Referring Physician: Merri Ray*   Date of Injury: 07/20/2017  Date Care Plan Established/Reviewed: 01/25/2018  Date Treatment Started: 01/25/2018  Visit Count: 13   Diagnosis:   1. Acute left-sided thoracic back pain        Subjective     Outcome Measure   Tool Used/Details: FOTO  Score: 76  Predicted Functional Outcome: 3    Social Support/Occupation      Occupation: translator/student    Patient reports that she's feeling much better since last tx - not having any issues or tightness. Has been a lot more aware of how she is sitting at her desk.                Objective     Range of Motion     AROM: Cervical/Thoracic Spine       Cervical Flexion 50    Cervical Extension 45    Thoracic Flexion     Thoracic Extension 10    (blank fields were intentionally left blank)         Left AROM Cervical/Thoracic    Right AROM   45  Cervical Lateral Flexion 35    75  Cervical Rotation 65      Thoracic Lateral Flexion       Thoracic Rotation     (blank fields were intentionally left blank)     IE  Left AROM   Shoulder IE  Right AROM 03/06/18 R AROM    WFL Flexion pain 175     Extension      WFL Abduction pain 180    [facility] Internal Rotation pain 80    WFL External Rotation pain 90   (blank fields were intentionally left blank)  Tingling reported with L shoulder ER    Strength     initial eval L 02/15/18  Left 03/15/18 Left Strength  Shoulder   initial eval R 02/15/18  Right 03/15/18 Right   5   Neck Lateral Flexion 5     4+ 5-   Shoulder Flexion 5 5     4+  Shoulder Extension  5    5 5    Shoulder Abduction 5 5    4+ 5  Shoulder IR 5 5    4+ 4+  Shoulder ER 5 5       Serratus         Rhomboids         Upper Trapezius      4- 4+ 4+ Middle Trapezius 4 4 4+   3+ 4 4 Lower Trapezius 4- 4 4      Biceps         Triceps      (blank fields were intentionally left blank)           Treatment     Therapeutic  Exercises   Justification: To improve flexibility/ROM, strength, and endurance.    As marked in flowsheet  Updated HEP      Neuromuscular Re-Education   Justification: To improve scapular stabilization and postural muscle recruitment; promote optimal body awareness/mechanics with TEs    As marked in flowsheet  Tactile cues to enhance muscle engagement and improve form  Ktape: postural awareness    Manual Therapy   Justification: Improve thoracic mobility, decrease soft tissue tightness and reduce nerve tension  IASTM B thoracic PVMs  STM/DTM subscap, infraspinatus, UT, rhomboids/mid trap, borders of scap  PA mobs at thoracic spine  grade 1-3  L Scapular mobilization: inferior and upward rotation glides; attempted distraction  L Subscap release with and without AROM ER and flexion    Therapeutic Activity   Justification: Improve tolerance with repetitive activities, lifting, OH and forward reaching, pushing/pulling for household chores, work tasks, gym routine, and dressing/bathing without compensation    As marked in flowsheet         ---      ---   Total Time   Timed Minutes  43 minutes   Total Time  43 minutes        Assessment   Patient responded well to previous PT tx, no longer experiencing intensity of pain per subjective and decreased palpable tone throughout thoracic muscles and B UTs. Generalized tightness remains at L UT and along thoracic PVMs - initially tender with IASTM and joint mobs but less rigid following. Reintroduced pushing/pull as well as emphasized stabilization exercises today with focus on endurance to avoid compromising compensation patterns when fatigued at gym or when doing errands/chores. Overall good tolerance with TEs - did require min-mod cues to enhance scap retraction and properly engaged/stabilize throughout reps.  Patient would benefit from continued skilled PT to optimize tolerance with carrying bags, prolonged positioning, and maximize safety with lifting/workouts.  Plan   Cont  with POC. Work towards discharge in 3 visits - patient aware and in agreement at time of appt.  NOTE: patient only 1x/week      Goals    Goal 1:  Patient will demonstrate independence in prescribed HEP with proper form, sets and reps for safe discharge to an independent program.  02/19/18 reports compliance   Sessions:  16   Progression:  progressing      Goal 2:  Demonstrate proper posture and body mechanics with sitting so patient can sit throughout workday and class wwithout difficulty  02/15/18: improved sitting tolerance and working out per subjective jm   Sessions:  16   Progression:  progressing       Goal 3:  Demonstrate proper posture and body mechanics with lifting at gym so pt does not reproduce symptoms  03/06/18: progressing with function and posture   Sessions:  16   Progression:  progressing      Goal 4:  Pt will improve scapular strength to 4+/5 to improve postural endurance  02/19/18: progressing with mid and lower trap strength    Sessions:  16   Progression:  progressing                              Exercise Flow Sheet    Exercise Specifics 01/29/18 01/31/18 02/05/18 02/08/18 02/12/18 02/15/18 02/19/18 02/22/18 03/06/18 03/15/18   UBE    - - - - 2'/2'  lvl 3  LS -- - 2'/2'  lvl 3  LS - --   I's/T's/Y's    - - I's  2#  T's  1#  Y's  2x10  LS  nmr I's  2#  T's  1#  Y's  2x10  LS  nmr - -- - T's/W's  2x10  LS  --   B ER    RTB  3x10  LS  nmr   GTB 10x2 jm GTB  10x2  LS GTB  10x3  LS - -- - -  Deep neck flexor endurance 10" 10x jm   Pro/ret    table  Pro/ret  2x10  LS  nmr 10x2 jm 10x2  RTB  LS 10x3  RTB  3x10  LS - Table push up 5" hold 10x jm Table push up 5" hold 10x  LS  nmr Table push up 5" hold 10x  LS  nmr Table push up RTB at wrist 3" hold 10x2 jm  nmr --   POE    RTB  10''x5  LS  nmr -- - - - POE + ER iso 5" hold 5x3  jm POE + ER iso with eye moveemnts  3 min  LS - 3 month prone 1 min jm  nmr    Side plank o nknees 10" hold 4x - pain, irritated  jm  POE + reach 10x B;     + push/pull 10xB jm   Rows/PD    black 30x ea  LS  nmr Blk tube 10x2 jm blk tub  10x3 ea  LS black tub 10x3 ea  LS - -- - - SA rolls on foam roller 2# 10x2 jm --   alt hor abd    RTB  2x10  LS  nmr -- - - - --- - -  shoulder flexion + twist 10# green jm        supin eon 1/2 foam: H Abbd RTB 10x2 jm    flexion RTB 15x jm - - - supin eon 1/2 foam: H Abbd GTB 10x2 jm    flexion GTB 20x jm    D2 GTB 10x2 B jm supine on 1/2 foam: H Abbd GTB 10x2     flexion GTB 20x      D2 GTB  10x2  LS - supine on 1/2 foam: H Abd RTB 10x2 jm    SA punch GTB 20x jm supine on 1/2 foam: H Abd GTB 20x jm    shoulder flexion GTB 20x jm         Prone function IR to ER  15x  LS - - - - - plank on BOSU shifts 5x3 jm  nmr plank on BOSU shifts 5x3 jm  nmr         quadruped thoracic rot  2x10B  LS - quadruped thoracic rot  2x10B  LS 10x B jm quadruped thoracic rot  10xB  LS 2x10B  LS -- SFMA thoracic rot + ext 10x2 B jm           Cat/cow  20x  LS   - - chin tuck lift 5''x10    Quadruped chin tuck 5''x10  LS -- FMCC push 2 plates 47W2 jm    Pull 2 plates 95A2 jm   Home Exercise Program                (Initials = supervised exercise by clinician)        Exercise Flow Sheet    Exercise Specifics 03/20/18 03/22/18              UBE  - -            FMCC    - -            plank    At table with alternating UE lift 20x jm B -            POE    + reach  10x jm -                 [facility] protraction 3plates 10x; 2 plates 13Y jm    FMCC retraction 3 plates 86V7 jm  ta  Multifidi pulls  Green tube 10x2 jm  nmr                 mulitidi V pulls  Green tube 10x2 jm  nmr                 Standing T to W transition   2# 10x2 jm    W squeeze pulse 2# 10x2 jm                 SA rolls pulse  --                 Squat press plate forward 16# 20x jm  ta               Wall slide + UE 10# plate  --            Office set-up and siting posture    X 8  minutes  ta              Home Exercise Program    Updated jm             (Initials = supervised exercise by clinician)    Access Code: [email or web address]   URL: [email or web address]   Date: 03/20/2018   Prepared by: Edwina Barth     Exercises   Single Arm Doorway Pec Stretch at 90 Degrees Abduction - 10 reps - 1 sets - 1x daily - 7x weekly   Quadruped Thoracic Rotation Full Range with Hand on Neck - 10 reps - 2 sets - 1x daily - 7x weekly   Supine Deep Neck Flexor Training - Repetitions - 10 reps - 1 sets - 5 hold - 1x daily - 7x weekly   Quadruped Cervical Retraction - 10 reps - 1 sets - 5 hold - 1x daily - 7x weekly   Push Up on Table - 10 reps - 2 sets - 5 hold - 1x daily - 7x weekly   Prone Shoulder Horizontal Abduction - 10 reps - 2 sets - 1x daily - 7x weekly   Prone W Scapular Retraction - 10 reps - 2 sets - 1x daily - 7x weekly   Thoracic Mobilization on Foam Roll - 20 reps - 1x daily - 7x weekly   Hooklying Scapular Protraction on Foam Roll - 20 reps - 1x daily - 7x weekly   Overhead Reach on Foam Roll - 20 reps - 1x daily - 7x weekly     Georga Hacking, LPTA

## 2018-03-28 ENCOUNTER — Inpatient Hospital Stay: Payer: Commercial Managed Care - POS | Attending: Family

## 2018-03-28 DIAGNOSIS — M546 Pain in thoracic spine: Secondary | ICD-10-CM

## 2018-03-28 NOTE — PT/OT Therapy Note (Signed)
Name: Tracey Burns Age: 32 y.o.   Referring Physician: Merri Ray*   Date of Injury: 07/20/2017  Date Care Plan Established/Reviewed: 01/25/2018  Date Treatment Started: 01/25/2018  Visit Count: 14   Diagnosis:   1. Acute left-sided thoracic back pain        Subjective     Social Support/Occupation      Occupation: translator/student    Patient reports that she's having a lot of stiffness and tightness and discomfort at L shoulder blade and UT since she lifted and was doing "speed work" running.      Objective     Range of Motion     AROM: Cervical/Thoracic Spine       Cervical Flexion 50    Cervical Extension 45    Thoracic Flexion     Thoracic Extension 10    (blank fields were intentionally left blank)         Left AROM Cervical/Thoracic    Right AROM   45  Cervical Lateral Flexion 35    75  Cervical Rotation 65      Thoracic Lateral Flexion       Thoracic Rotation     (blank fields were intentionally left blank)     IE  Left AROM   Shoulder IE  Right AROM 03/06/18 R AROM    WFL Flexion pain 175     Extension      WFL Abduction pain 180    [facility] Internal Rotation pain 80    WFL External Rotation pain 90   (blank fields were intentionally left blank)  Tingling reported with L shoulder ER    Strength     initial eval L 02/15/18  Left 03/15/18 Left Strength  Shoulder   initial eval R 02/15/18  Right 03/15/18 Right   5   Neck Lateral Flexion 5     4+ 5-   Shoulder Flexion 5 5     4+  Shoulder Extension  5    5 5    Shoulder Abduction 5 5    4+ 5  Shoulder IR 5 5    4+ 4+  Shoulder ER 5 5       Serratus         Rhomboids         Upper Trapezius      4- 4+ 4+ Middle Trapezius 4 4 4+   3+ 4 4 Lower Trapezius 4- 4 4      Biceps         Triceps      (blank fields were intentionally left blank)                     Treatment     Therapeutic Exercises   Justification: To improve flexibility/ROM, strength, and endurance.    As marked  in flowsheet      Neuromuscular Re-Education   Justification: To improve scapular stabilization and postural muscle recruitment; promote optimal body awareness/mechanics with TEs  As marked in flowsheet  Extensive tactile cues to to minimize UT compensation and lumbar hyperextension - visual feedback from mirror also utilized.      Manual Therapy   Justification: To address soft tissue tightness; optimize scapulothoracic and spinal mobility.    IASTM B thoracic PVMs  STM/DTM subscap, infraspinatus, UT, rhomboids/mid trap, borders of scap  PA mobs at thoracic spine grade 1-3  L Scapular mobilization: inferior and upward rotation glides; attempted distraction  L Subscap release with  and without AROM ER and flexion  Ktape: B UTs       ---      ---   Total Time   Timed Minutes  43 minutes   Total Time  43 minutes        Assessment   Patient presents with increased tone at L UT, scapular muscles, and thoracic PVMs consistent with complaints of discomfort - increased tenderness with STM compared to previous session and poor scapulothoracic mobility. Patient challenged with proper activation with lower trap and other scap stabilizers requiring more cues than usual. Attempted to progress to unilateral UE stabilization but pain at UT limiting. Ktape applied to B UTs to address tightness.  Patient still progressing well overall with functional activities and PT goals - still on track for D/C in 2 visits with focus on optimizing stabilization and postural/exercise awareness.  Plan   Cont with POC. Work towards discharge in 2 visits - patient aware and in agreement at time of appt.  NOTE: patient only 1x/week      Goals    Goal 1:  Patient will demonstrate independence in prescribed HEP with proper form, sets and reps for safe discharge to an independent program.  02/19/18 reports compliance   Sessions:  16   Progression:  progressing      Goal 2:  Demonstrate proper posture and body mechanics with sitting so patient can sit  throughout workday and class wwithout difficulty  02/15/18: improved sitting tolerance and working out per subjective jm   Sessions:  16   Progression:  progressing       Goal 3:  Demonstrate proper posture and body mechanics with lifting at gym so pt does not reproduce symptoms  03/06/18: progressing with function and posture   Sessions:  16   Progression:  progressing      Goal 4:  Pt will improve scapular strength to 4+/5 to improve postural endurance  02/19/18: progressing with mid and lower trap strength    Sessions:  16   Progression:  progressing                              Exercise Flow Sheet    Exercise Specifics 01/29/18 01/31/18 02/05/18 02/08/18 02/12/18 02/15/18 02/19/18 02/22/18 03/06/18 03/15/18   UBE    - - - - 2'/2'  lvl 3  LS -- - 2'/2'  lvl 3  LS - --   I's/T's/Y's    - - I's  2#  T's  1#  Y's  2x10  LS  nmr I's  2#  T's  1#  Y's  2x10  LS  nmr - -- - T's/W's  2x10  LS  --   B ER    RTB  3x10  LS  nmr   GTB 10x2 jm GTB  10x2  LS GTB  10x3  LS - -- - -  Deep neck flexor endurance 10" 10x jm   Pro/ret    table  Pro/ret  2x10  LS  nmr 10x2 jm 10x2  RTB  LS 10x3  RTB  3x10  LS - Table push up 5" hold 10x jm Table push up 5" hold 10x  LS  nmr Table push up 5" hold 10x  LS  nmr Table push up RTB at wrist 3" hold 10x2 jm  nmr --   POE    RTB  10''x5  LS  nmr -- - - -  POE + ER iso 5" hold 5x3  jm POE + ER iso with eye moveemnts  3 min  LS - 3 month prone 1 min jm  nmr    Side plank o nknees 10" hold 4x - pain, irritated jm  POE + reach 10x B;     + push/pull 10xB jm   Rows/PD    black 30x ea  LS  nmr Blk tube 10x2 jm blk tub  10x3 ea  LS black tub 10x3 ea  LS - -- - - SA rolls on foam roller 2# 10x2 jm --   alt hor abd    RTB  2x10  LS  nmr -- - - - --- - -  shoulder flexion + twist 10# green jm        supin eon 1/2 foam: H Abbd RTB 10x2 jm    flexion RTB 15x jm - - - supin eon 1/2 foam: H Abbd GTB 10x2 jm    flexion GTB 20x  jm    D2 GTB 10x2 B jm supine on 1/2 foam: H Abbd GTB 10x2     flexion GTB 20x      D2 GTB  10x2  LS - supine on 1/2 foam: H Abd RTB 10x2 jm    SA punch GTB 20x jm supine on 1/2 foam: H Abd GTB 20x jm    shoulder flexion GTB 20x jm         Prone function IR to ER  15x  LS - - - - - plank on BOSU shifts 5x3 jm  nmr plank on BOSU shifts 5x3 jm  nmr         quadruped thoracic rot  2x10B  LS - quadruped thoracic rot  2x10B  LS 10x B jm quadruped thoracic rot  10xB  LS 2x10B  LS -- SFMA thoracic rot + ext 10x2 B jm           Cat/cow  20x  LS   - - chin tuck lift 5''x10    Quadruped chin tuck 5''x10  LS -- FMCC push 2 plates 38V5 jm    Pull 2 plates 64P3 jm   Home Exercise Program                (Initials = supervised exercise by clinician)        Exercise Flow Sheet    Exercise Specifics 03/20/18 03/22/18 03/28/18             UBE  - - -           FMCC    - - Shoulder 3 way (H Abd, X's) GTB 10x2 jm  nmr           plank    At table with alternating UE lift 20x jm B - Side plank on knees 10" hold 4x - pain  jm  nmr           POE    + reach  10x jm - + reach 10x B jm  nmr    Prone 3 month - 3 min total with cues jm  nmr                FMCC protraction 3plates 10x; 2 plates 29J jm    FMCC retraction 3 plates 18A4 jm  ta -                Multifidi pulls  Green tube 10x2 jm  nmr -  mulitidi V pulls  Green tube 10x2 jm  nmr Green tube 10x2 jm  nmr                Standing T to W transition   2# 10x2 jm    W squeeze pulse 2# 10x2 jm --                SA rolls pulse  -- 3# 1 min x 1    SA pulse off wall 3# 1 min jm    SA swing 3# 10x jm                Squat press plate forward 16# 20x jm  ta --              Wall slide + UE 10# plate  -- --           Office set-up and siting posture    X 8 minutes  ta   --           Home Exercise Program    Updated jm             (Initials = supervised exercise by clinician)    Access Code: [email or web address]   URL: [email or web address]    Date: 03/20/2018   Prepared by: Edwina Barth     Exercises   Single Arm Doorway Pec Stretch at 90 Degrees Abduction - 10 reps - 1 sets - 1x daily - 7x weekly   Quadruped Thoracic Rotation Full Range with Hand on Neck - 10 reps - 2 sets - 1x daily - 7x weekly   Supine Deep Neck Flexor Training - Repetitions - 10 reps - 1 sets - 5 hold - 1x daily - 7x weekly   Quadruped Cervical Retraction - 10 reps - 1 sets - 5 hold - 1x daily - 7x weekly   Push Up on Table - 10 reps - 2 sets - 5 hold - 1x daily - 7x weekly   Prone Shoulder Horizontal Abduction - 10 reps - 2 sets - 1x daily - 7x weekly   Prone W Scapular Retraction - 10 reps - 2 sets - 1x daily - 7x weekly   Thoracic Mobilization on Foam Roll - 20 reps - 1x daily - 7x weekly   Hooklying Scapular Protraction on Foam Roll - 20 reps - 1x daily - 7x weekly   Overhead Reach on Foam Roll - 20 reps - 1x daily - 7x weekly     Georga Hacking, LPTA

## 2018-04-04 ENCOUNTER — Inpatient Hospital Stay: Payer: Commercial Managed Care - POS | Attending: Family

## 2018-04-04 DIAGNOSIS — M546 Pain in thoracic spine: Secondary | ICD-10-CM | POA: Insufficient documentation

## 2018-04-04 NOTE — PT/OT Therapy Note (Signed)
Name: Tracey Burns Age: 32 y.o.   Referring Physician: Merri Ray*   Date of Injury: 07/20/2017  Date Care Plan Established/Reviewed: 04/04/2018  Date Treatment Started: 01/25/2018  Visit Count: 15   Diagnosis:   1. Acute left-sided thoracic back pain        Subjective     Social Support/Occupation      Occupation: translator/student    Patient reports it gets sore if she does too much. Will flare if she does a hard running work, gym workout.       Objective     Range of Motion     AROM: Cervical/Thoracic Spine    04/04/18   Cervical Flexion 50 60   Cervical Extension 45 56   Thoracic Flexion     Thoracic Extension 10 25   (blank fields were intentionally left blank)      Initial 04/04/18  Left AROM Cervical/Thoracic Initial 04/04/18  Right AROM   45 50 Cervical Lateral Flexion 35 54   75 85 Cervical Rotation 65 74     Thoracic Lateral Flexion       Thoracic Rotation     (blank fields were intentionally left blank)      Strength     initial eval L 02/15/18  Left 04/04/18/19 Left Strength  Shoulder   initial eval R 02/15/18  Right 6/12//19 Right   5   Neck Lateral Flexion 5     4+ 5- 5  Shoulder Flexion 5 5 5     4+ 5 Shoulder Extension  5 5   5 5 5   Shoulder Abduction 5 5 5    4+ 5 5 Shoulder IR 5 5 5    4+ 4+ 5 Shoulder ER 5 5 5       Serratus         Rhomboids         Upper Trapezius      4- 4+ 4 Middle Trapezius 4 4 5    3+ 4 4 Lower Trapezius 4- 4 4+      Biceps         Triceps      (blank fields were intentionally left blank)                  04/04/18 deep neck flexor endurance tests 30 sec  04/04/18 plank-collapse after 15 sec  6.12/19 quadruped rocking-slight winging         Treatment     Therapeutic Exercises   Justification: To improve flexibility/ROM, strength, and endurance.    As marked in flowsheet  Reassessed to update POC  Updated/reviewed HEP    Neuromuscular Re-Education   Justification: To improve scapular stabilization and  postural muscle recruitment; promote optimal body awareness/mechanics with TEs  Plank, quadruped rocking for serratus engagment, cueing for chin tuck and serratus engagement    Manual Therapy   Justification: To address soft tissue tightness; optimize scapulothoracic and spinal mobility.    STM to UT, serratus, teres, periscapular area  Scapular mobs all directions mobs 3-4       ---      ---   Total Time   Timed Minutes  43 minutes   Total Time  43 minutes        Assessment   Reassessed for updated POC as cert date has expired. Pt has nearly met all goals. Cervical ROM has normalized. Good shoulder strength but limited with scapular strength at middle/low trap. Functional testing (plank, quadruped rocking, deep neck flexor endurance  test) displayed continued weakness with scapular strength and deep neck flexors but overall has been progressing per exercises. Updated HEP to reflect deficits assessed today. Pt is on track to discharge next session.  Plan   Discharge next session      Goals    Goal 1:  Patient will demonstrate independence in prescribed HEP with proper form, sets and reps for safe discharge to an independent program.  04/04/18 compliance   Sessions:  16   Progression:  progressing      Goal 2:  Demonstrate proper posture and body mechanics with sitting so patient can sit throughout workday and class wwithout difficulty  04/04/18 reports no diffiuclty sitting   Sessions:  16   Progression:  met       Goal 3:  Demonstrate proper posture and body mechanics with lifting at gym so pt does not reproduce symptoms  04/04/18: reports will have a little pain and stiffness the next day after working with trainer   Sessions:  16   Progression:  progressing      Goal 4:  Pt will improve scapular strength to 4+/5 to improve postural endurance  04/04/18: progressing with mid and lower trap strength    Sessions:  16   Progression:  progressing                              Exercise Flow Sheet    Exercise Specifics  01/29/18 01/31/18 02/05/18 02/08/18 02/12/18 02/15/18 02/19/18 02/22/18 03/06/18 03/15/18   UBE    - - - - 2'/2'  lvl 3  LS -- - 2'/2'  lvl 3  LS - --   I's/T's/Y's    - - I's  2#  T's  1#  Y's  2x10  LS  nmr I's  2#  T's  1#  Y's  2x10  LS  nmr - -- - T's/W's  2x10  LS  --   B ER    RTB  3x10  LS  nmr   GTB 10x2 jm GTB  10x2  LS GTB  10x3  LS - -- - -  Deep neck flexor endurance 10" 10x jm   Pro/ret    table  Pro/ret  2x10  LS  nmr 10x2 jm 10x2  RTB  LS 10x3  RTB  3x10  LS - Table push up 5" hold 10x jm Table push up 5" hold 10x  LS  nmr Table push up 5" hold 10x  LS  nmr Table push up RTB at wrist 3" hold 10x2 jm  nmr --   POE    RTB  10''x5  LS  nmr -- - - - POE + ER iso 5" hold 5x3  jm POE + ER iso with eye moveemnts  3 min  LS - 3 month prone 1 min jm  nmr    Side plank o nknees 10" hold 4x - pain, irritated jm  POE + reach 10x B;     + push/pull 10xB jm   Rows/PD    black 30x ea  LS  nmr Blk tube 10x2 jm blk tub  10x3 ea  LS black tub 10x3 ea  LS - -- - - SA rolls on foam roller 2# 10x2 jm --   alt hor abd    RTB  2x10  LS  nmr -- - - - --- - -  shoulder flexion + twist 10# green jm  supin eon 1/2 foam: H Abbd RTB 10x2 jm    flexion RTB 15x jm - - - supin eon 1/2 foam: H Abbd GTB 10x2 jm    flexion GTB 20x jm    D2 GTB 10x2 B jm supine on 1/2 foam: H Abbd GTB 10x2     flexion GTB 20x      D2 GTB  10x2  LS - supine on 1/2 foam: H Abd RTB 10x2 jm    SA punch GTB 20x jm supine on 1/2 foam: H Abd GTB 20x jm    shoulder flexion GTB 20x jm         Prone function IR to ER  15x  LS - - - - - plank on BOSU shifts 5x3 jm  nmr plank on BOSU shifts 5x3 jm  nmr         quadruped thoracic rot  2x10B  LS - quadruped thoracic rot  2x10B  LS 10x B jm quadruped thoracic rot  10xB  LS 2x10B  LS -- SFMA thoracic rot + ext 10x2 B jm           Cat/cow  20x  LS   - - chin tuck lift 5''x10    Quadruped chin  tuck 5''x10  LS -- FMCC push 2 plates 82N5 jm    Pull 2 plates 62Z3 jm   Home Exercise Program                (Initials = supervised exercise by clinician)        Exercise Flow Sheet    Exercise Specifics 03/20/18 03/22/18 03/28/18 04/04/18            UBE  - - - -          FMCC    - - Shoulder 3 way (H Abd, X's) GTB 10x2 jm  nmr -          plank    At table with alternating UE lift 20x jm B - Side plank on knees 10" hold 4x - pain  jm  nmr -          POE    + reach  10x jm - + reach 10x B jm  nmr    Prone 3 month - 3 min total with cues jm  nmr --               FMCC protraction 3plates 10x; 2 plates 08M jm    FMCC retraction 3 plates 57Q4 jm  ta - quadruped rocking  2x10      Plank arms extended 15''x3  LS               Multifidi pulls  Green tube 10x2 jm  nmr - -               mulitidi V pulls  Green tube 10x2 jm  nmr Green tube 10x2 jm  nmr -               Standing T to W transition   2# 10x2 jm    W squeeze pulse 2# 10x2 jm -- Prone T's  1#  3x10  LS               SA rolls pulse  -- 3# 1 min x 1    SA pulse off wall 3# 1 min jm    SA swing 3# 10x jm  Squat press plate forward 57# 20x jm  ta --              Wall slide + UE 10# plate  -- --           Office set-up and siting posture    X 8 minutes  ta   --           Home Exercise Program    Updated jm   Updated HEP          (Initials = supervised exercise by clinician)  Access Code: [email or web address]   URL: [email or web address]   Date: 04/04/2018   Prepared by: Magdalene River     Exercises  Quadruped Thoracic Rotation Full Range with Hand on Neck - 10 reps - 2 sets - 1x daily - 7x weekly  Supine Deep Neck Flexor Training - Repetitions - 5 reps - 1 sets - 10 hold - 1x daily - 7x weekly  Push Up on Table - 10 reps - 2 sets - 5 hold - 1x daily - 7x weekly  Prone Shoulder Horizontal Abduction - 10 reps - 2 sets - 1x daily - 7x weekly  Prone W Scapular Retraction - 10 reps - 2 sets - 1x daily - 7x weekly  Quadruped Rocking Slow - 5 reps - 3  sets - 1x daily - 7x weekly  Thoracic Mobilization on Foam Roll - 20 reps - 1x daily - 7x weekly  Full Plank - 3 sets - 10 hold - 1x daily - 7x weekly  Overhead Reach on Foam Roll - 20 reps - 1x daily - 7x weekly  Access Code: [email or web address]   URL: [email or web address]   Date: 03/20/2018   Prepared by: Edwina Barth     Exercises   Single Arm Doorway Pec Stretch at 90 Degrees Abduction - 10 reps - 1 sets - 1x daily - 7x weekly   Quadruped Thoracic Rotation Full Range with Hand on Neck - 10 reps - 2 sets - 1x daily - 7x weekly   Supine Deep Neck Flexor Training - Repetitions - 10 reps - 1 sets - 5 hold - 1x daily - 7x weekly   Quadruped Cervical Retraction - 10 reps - 1 sets - 5 hold - 1x daily - 7x weekly   Push Up on Table - 10 reps - 2 sets - 5 hold - 1x daily - 7x weekly   Prone Shoulder Horizontal Abduction - 10 reps - 2 sets - 1x daily - 7x weekly   Prone W Scapular Retraction - 10 reps - 2 sets - 1x daily - 7x weekly   Thoracic Mobilization on Foam Roll - 20 reps - 1x daily - 7x weekly   Hooklying Scapular Protraction on Foam Roll - 20 reps - 1x daily - 7x weekly   Overhead Reach on Foam Roll - 20 reps - 1x daily - 7x weekly     Magdalene River, PT

## 2018-04-04 NOTE — Addendum Note (Signed)
Addended by: Magdalene River on: 04/04/2018 09:54 PM     Modules accepted: Orders

## 2018-04-04 NOTE — Progress Notes (Signed)
Name: Tracey Burns Age: 32 y.o.   Referring Physician: Merri Ray*   Date of Injury: 07/20/2017  Date Care Plan Established/Reviewed: 04/04/2018  Date Treatment Started: 01/25/2018  Visit Count: 15   Diagnosis:   1. Acute left-sided thoracic back pain        Subjective     History of Present Illness   Functional Limitations (PLOF):   Tightness/soreness after workouts(hx of thoraic pain)  Pain during random times/((hx of thoraic pain)      Social Support/Occupation      Occupation: translator/student                        Assessment   Tracey Burns is a 32 y.o. 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  Prior Level of Function:   Tightness/soreness after workouts(hx of thoraic pain)  Pain during random times/((hx of thoraic pain)    Prognosis: good  Plan   Visits per week: 1  Number of Sessions: 1  Direct One on One  16109: Therapeutic Exercise: To Develop Strength and Endurance, ROM and Flexibility  [id number]: Neuromuscular Reeducation  97140: Manual Therapy techniques (mobilization, manipulation, manual traction) (STM to cervical, thoraic, shoulder mobs 1-4)  97530: Therapeutic Activities: Dynamic activities to improve functional performance  Dry Needling  Supervised Modalities  97010: Thermal modalities: hot/cold packs  60454: Electrical stimulation  Taping as needed      Goals    Goal 1:  Patient will demonstrate independence in prescribed HEP with proper form, sets and reps for safe discharge to an independent program.  04/04/18 compliance   Sessions:  16   Progression:  progressing      Goal 2:  Demonstrate proper posture and body mechanics with sitting so patient can sit throughout workday and class wwithout  difficulty  04/04/18 reports no diffiuclty sitting   Sessions:  16   Progression:  met       Goal 3:  Demonstrate proper posture and body mechanics with lifting at gym so pt does not reproduce symptoms  04/04/18: reports will have a little pain and stiffness the next day after working with trainer   Sessions:  16   Progression:  progressing      Goal 4:  Pt will improve scapular strength to 4+/5 to improve postural endurance  04/04/18: progressing with mid and lower trap strength    Sessions:  16   Progression:  progressing                                Magdalene River, PT

## 2018-04-10 ENCOUNTER — Inpatient Hospital Stay: Payer: Commercial Managed Care - POS | Attending: Family

## 2018-04-10 DIAGNOSIS — M546 Pain in thoracic spine: Secondary | ICD-10-CM

## 2018-04-10 NOTE — PT/OT Therapy Note (Signed)
Name: Tracey Burns Age: 32 y.o.   Referring Physician: Merri Ray*   Date of Injury: 07/20/2017  Date Care Plan Established/Reviewed: 04/04/2018  Date Treatment Started: 01/25/2018  Visit Count: 16   Diagnosis:   1. Acute left-sided thoracic back pain        Subjective     Social Support/Occupation      Occupation: translator/student    Patient reports that she feels pretty good - still gets tight in the L shoulder/neck with running and heavy lifting but doing a lot better just being aware of posture and form when working out.       Objective     Range of Motion     AROM: Cervical/Thoracic Spine    04/04/18   Cervical Flexion 50 60   Cervical Extension 45 56   Thoracic Flexion     Thoracic Extension 10 25   (blank fields were intentionally left blank)      Initial 04/04/18  Left AROM Cervical/Thoracic Initial 04/04/18  Right AROM   45 50 Cervical Lateral Flexion 35 54   75 85 Cervical Rotation 65 74     Thoracic Lateral Flexion       Thoracic Rotation     (blank fields were intentionally left blank)    Strength     initial eval L 02/15/18  Left 04/04/18/19 Left Strength  Shoulder   initial eval R 02/15/18  Right 6/12//19 Right   5   Neck Lateral Flexion 5     4+ 5- 5  Shoulder Flexion 5 5 5     4+ 5 Shoulder Extension  5 5   5 5 5   Shoulder Abduction 5 5 5    4+ 5 5 Shoulder IR 5 5 5    4+ 4+ 5 Shoulder ER 5 5 5       Serratus         Rhomboids         Upper Trapezius      4- 4+ 4 Middle Trapezius 4 4 5    3+ 4 4 Lower Trapezius 4- 4 4+      Biceps         Triceps      (blank fields were intentionally left blank)    FOTO OUTCOMES  Initial eval: 76/100  Discharge: 94/100                  04/04/18 deep neck flexor endurance tests 30 sec  04/04/18 plank-collapse after 15 sec  6.12/19 quadruped rocking-slight winging         Treatment     Therapeutic Exercises   Justification: To improve flexibility/ROM, strength, and endurance.    As marked in  flowsheet  Reviewed HEP to ensure safe transition to D/C    Neuromuscular Re-Education   Justification: To improve scapular stabilization and postural muscle recruitment; promote optimal body awareness/mechanics with TEs  As marked in flowsheet  Rotational stability; verbal and tactile cues to minimize rotation with pulling/pushing      Manual Therapy   Justification: To address soft tissue tightness; optimize scapulothoracic and spinal mobility.    STM/IASTM L UT, serratus, teres,infraspinatus  Scapular mobs all directions + distraction  PA mobs at thoracic spine grade 1-3       ---      ---   Total Time   Timed Minutes  43 minutes   Total Time  43 minutes        Assessment   Patient demonstrates improved  postural/UE strength and increased spinal mobility since initial eval. Patient consistently compliant with HEP, independent with pain management techniques, and able to identify and execute modifications when necessary. Outcomes reveal improved functionality and pain - with minimal activity limitations.  Good progress towards goals and understanding/mechanics with comprehensive HEP so patient transitioned to discharge based on improvements.     Plan   Discharge to HEP.       Goals    Goal 1:  Patient will demonstrate independence in prescribed HEP with proper form, sets and reps for safe discharge to an independent program.  04/04/18 compliance   Sessions:  16   Progression:  progressing      Goal 2:  Demonstrate proper posture and body mechanics with sitting so patient can sit throughout workday and class wwithout difficulty  04/04/18 reports no diffiuclty sitting   Sessions:  16   Progression:  met       Goal 3:  Demonstrate proper posture and body mechanics with lifting at gym so pt does not reproduce symptoms  04/04/18: reports will have a little pain and stiffness the next day after working with trainer   Sessions:  16   Progression:  progressing      Goal 4:  Pt will improve scapular strength to 4+/5 to improve  postural endurance  04/04/18: progressing with mid and lower trap strength    Sessions:  16   Progression:  progressing                              Exercise Flow Sheet    Exercise Specifics 01/29/18 01/31/18 02/05/18 02/08/18 02/12/18 02/15/18 02/19/18 02/22/18 03/06/18 03/15/18   UBE    - - - - 2'/2'  lvl 3  LS -- - 2'/2'  lvl 3  LS - --   I's/T's/Y's    - - I's  2#  T's  1#  Y's  2x10  LS  nmr I's  2#  T's  1#  Y's  2x10  LS  nmr - -- - T's/W's  2x10  LS  --   B ER    RTB  3x10  LS  nmr   GTB 10x2 jm GTB  10x2  LS GTB  10x3  LS - -- - -  Deep neck flexor endurance 10" 10x jm   Pro/ret    table  Pro/ret  2x10  LS  nmr 10x2 jm 10x2  RTB  LS 10x3  RTB  3x10  LS - Table push up 5" hold 10x jm Table push up 5" hold 10x  LS  nmr Table push up 5" hold 10x  LS  nmr Table push up RTB at wrist 3" hold 10x2 jm  nmr --   POE    RTB  10''x5  LS  nmr -- - - - POE + ER iso 5" hold 5x3  jm POE + ER iso with eye moveemnts  3 min  LS - 3 month prone 1 min jm  nmr    Side plank o nknees 10" hold 4x - pain, irritated jm  POE + reach 10x B;     + push/pull 10xB jm   Rows/PD    black 30x ea  LS  nmr Blk tube 10x2 jm blk tub  10x3 ea  LS black tub 10x3 ea  LS - -- - - SA rolls on foam roller 2# 10x2 jm --   alt  hor abd    RTB  2x10  LS  nmr -- - - - --- - -  shoulder flexion + twist 10# green jm        supin eon 1/2 foam: H Abbd RTB 10x2 jm    flexion RTB 15x jm - - - supin eon 1/2 foam: H Abbd GTB 10x2 jm    flexion GTB 20x jm    D2 GTB 10x2 B jm supine on 1/2 foam: H Abbd GTB 10x2     flexion GTB 20x      D2 GTB  10x2  LS - supine on 1/2 foam: H Abd RTB 10x2 jm    SA punch GTB 20x jm supine on 1/2 foam: H Abd GTB 20x jm    shoulder flexion GTB 20x jm         Prone function IR to ER  15x  LS - - - - - plank on BOSU shifts 5x3 jm  nmr plank on BOSU shifts 5x3 jm  nmr         quadruped thoracic rot  2x10B  LS - quadruped thoracic  rot  2x10B  LS 10x B jm quadruped thoracic rot  10xB  LS 2x10B  LS -- SFMA thoracic rot + ext 10x2 B jm           Cat/cow  20x  LS   - - chin tuck lift 5''x10    Quadruped chin tuck 5''x10  LS -- FMCC push 2 plates 13Y8 jm    Pull 2 plates 65H8 jm   Home Exercise Program                (Initials = supervised exercise by clinician)        Exercise Flow Sheet    Exercise Specifics 03/20/18 03/22/18 03/28/18 04/04/18 04/10/18           UBE  - - - - -         FMCC    - - Shoulder 3 way (H Abd, X's) GTB 10x2 jm  nmr - -         plank    At table with alternating UE lift 20x jm B - Side plank on knees 10" hold 4x - pain  jm  nmr - -         POE    + reach  10x jm - + reach 10x B jm  nmr    Prone 3 month - 3 min total with cues jm  nmr -- -              FMCC protraction 3plates 10x; 2 plates 46N jm    FMCC retraction 3 plates 62X5 jm  ta - quadruped rocking  2x10      Plank arms extended 15''x3  LS Plank rocking 10x3 jm              Multifidi pulls  Green tube 10x2 jm  nmr - - Blue tband 15x B jm  nmr              mulitidi V pulls  Green tube 10x2 jm  nmr Green tube 10x2 jm  nmr - GTB 10x2 jm  nmr              Standing T to W transition   2# 10x2 jm    W squeeze pulse 2# 10x2 jm -- Prone T's  1#  3x10  LS -  SA rolls pulse  -- 3# 1 min x 1    SA pulse off wall 3# 1 min jm    SA swing 3# 10x jm  -              Squat press plate forward 16# 20x jm  ta --  -            Wall slide + UE 10# plate  -- --           Office set-up and siting posture    X 8 minutes  ta   --           Home Exercise Program    Updated jm   Updated HEP Reviewed HEP to ensure good understanding jm         (Initials = supervised exercise by clinician)  Access Code: [email or web address]   URL: [email or web address]   Date: 04/04/2018   Prepared by: Magdalene River     Exercises  Quadruped Thoracic Rotation Full Range with Hand on Neck - 10 reps - 2 sets - 1x daily - 7x weekly  Supine Deep Neck Flexor Training -  Repetitions - 5 reps - 1 sets - 10 hold - 1x daily - 7x weekly  Push Up on Table - 10 reps - 2 sets - 5 hold - 1x daily - 7x weekly  Prone Shoulder Horizontal Abduction - 10 reps - 2 sets - 1x daily - 7x weekly  Prone W Scapular Retraction - 10 reps - 2 sets - 1x daily - 7x weekly  Quadruped Rocking Slow - 5 reps - 3 sets - 1x daily - 7x weekly  Thoracic Mobilization on Foam Roll - 20 reps - 1x daily - 7x weekly  Full Plank - 3 sets - 10 hold - 1x daily - 7x weekly  Overhead Reach on Foam Roll - 20 reps - 1x daily - 7x weekly    Georga Hacking, LPTA

## 2018-04-17 ENCOUNTER — Inpatient Hospital Stay: Payer: Commercial Managed Care - POS

## 2018-04-19 ENCOUNTER — Inpatient Hospital Stay: Payer: Commercial Managed Care - POS

## 2018-05-07 DIAGNOSIS — E1165 Type 2 diabetes mellitus with hyperglycemia: Secondary | ICD-10-CM | POA: Diagnosis not present

## 2018-11-07 ENCOUNTER — Ambulatory Visit (HOSPITAL_COMMUNITY)
Admission: RE | Admit: 2018-11-07 | Discharge: 2018-11-07 | Disposition: A | Discharge status: Home / Self Care | Payer: 59 | Attending: Psychiatry | Admitting: Psychiatry

## 2018-11-07 NOTE — BH Assessment (Signed)
Assessment Note  Lindsey [name] is an 33 y.o. female. Pt reports previous SI. Pt states she is not current suicidal. Pt denies HI and AVH. Pt reports depression due to the following: states she has been going through difficulty times ie losing her home, currently residing in a hotel, and relationship issues. Pt reports 1 previous SI attempt in 2006 and 1 previous hospitalizations. Pt states she attended outpatient treatment for a short-time in 2006. Pt is not currently receiving outpatient treatment. Pt is not currently prescribed mental health medication. Pt denies SA.  Shuvon, NP recommends D/C. An appointment was made for the Pt at [facility] for IOP for tomorrow 1/16 at 8:30am. The Pt states she will be attending her appointment. Rita with IOP states she will call and check on the Pt if she does not come to her appointment tomorrow.   Diagnosis:  F33.1 MDD  Past Medical History:  Past Medical History:  Diagnosis Date  . Headache(784.0)   . Polycystic ovary   . Pseudotumor cerebri     Past Surgical History:  Procedure Laterality Date  . LUMBAR PUNCTURE      Family History:  Family History  Problem Relation Age of Onset  . Cancer Other   . Coronary artery disease Other   . Diabetes Other   . Polycystic ovary syndrome Mother   . Coronary artery disease Mother   . Narcolepsy Maternal Grandmother   . Heart failure Maternal Grandfather     Social History:  reports that she has been smoking cigarettes. She has been smoking about 0.25 packs per day. She has never used smokeless tobacco. She reports current drug use. Drug: Marijuana. She reports that she does not drink alcohol.  Additional Social History:  Alcohol / Drug Use Pain Medications: please see mar Prescriptions: please see mar Over the Counter: please see mar History of alcohol / drug use?: No history of alcohol / drug abuse  CIWA: CIWA-Ar BP: (!) 175/86 Pulse Rate: 86 COWS:     Allergies:  Allergies  Allergen Reactions  . Anti-Inflammatory Enzyme [Nutritional Supplements] Swelling    And fluid on the brain    Home Medications: (Not in a hospital admission)   OB/GYN Status:  No LMP recorded.  General Assessment Data Location of Assessment: [facility] Services TTS Assessment: In system Is this a Tele or Face-to-Face Assessment?: Face-to-Face Is this an Initial Assessment or a Re-assessment for this encounter?: Initial Assessment Patient Accompanied by:: N/A Language Other than English: No Living Arrangements: Other (Comment) What gender do you identify as?: Female Marital status: Single Maiden name: NA Pregnancy Status: No Living Arrangements: Other (Comment) Can pt return to current living arrangement?: Yes Admission Status: Voluntary Is patient capable of signing voluntary admission?: Yes Referral Source: Self/Family/Friend Insurance type: Hydrologist Exam [facility] Walk-in ONLY) Medical Exam completed: Yes  Crisis Care Plan Living Arrangements: Other (Comment) Legal Guardian: Other:(self) Name of Psychiatrist: NA Name of Therapist: NA  Education Status Is patient currently in school?: No Is the patient employed, unemployed or receiving disability?: Employed  Risk to self with the past 6 months Suicidal Ideation: No-Not Currently/Within Last 6 Months Has patient been a risk to self within the past 6 months prior to admission? : No Suicidal Intent: No Has patient had any suicidal intent within the past 6 months prior to admission? : No Is patient at risk for suicide?: No Suicidal Plan?: No Has patient had any suicidal plan within the past 6  months prior to admission? : No Access to Means: No What has been your use of drugs/alcohol within the last 12 months?: NA Previous Attempts/Gestures: Yes How many times?: 1 Other Self Harm Risks: NA Triggers for Past Attempts: None known Intentional Self Injurious Behavior:  None Family Suicide History: No Recent stressful life event(s): Loss (Comment), Job Loss Persecutory voices/beliefs?: No Depression: Yes Depression Symptoms: Tearfulness, Isolating, Loss of interest in usual pleasures, Feeling worthless/self pity, Feeling angry/irritable Substance abuse history and/or treatment for substance abuse?: No Suicide prevention information given to non-admitted patients: Not applicable  Risk to Others within the past 6 months Homicidal Ideation: No Does patient have any lifetime risk of violence toward others beyond the six months prior to admission? : No Thoughts of Harm to Others: No Current Homicidal Intent: No Current Homicidal Plan: No Access to Homicidal Means: No Identified Victim: NA History of harm to others?: No Assessment of Violence: None Noted Violent Behavior Description: NA Does patient have access to weapons?: No Criminal Charges Pending?: No Does patient have a court date: No Is patient on probation?: No  Psychosis Hallucinations: None noted Delusions: None noted  Mental Status Report Appearance/Hygiene: Unremarkable Eye Contact: Fair Motor Activity: Freedom of movement Speech: Logical/coherent Level of Consciousness: Alert Mood: Depressed Affect: Depressed Anxiety Level: Minimal Thought Processes: Coherent, Relevant Judgement: Unimpaired Orientation: Person, Place, Time, Situation Obsessive Compulsive Thoughts/Behaviors: None  Cognitive Functioning Concentration: Normal Memory: Recent Intact, Remote Intact Is patient IDD: No Insight: Fair Impulse Control: Fair Appetite: Fair Have you had any weight changes? : No Change Sleep: No Change Total Hours of Sleep: 8 Vegetative Symptoms: None  ADLScreening [facility] Assessment Services) Patient's cognitive ability adequate to safely complete daily activities?: Yes Patient able to express need for assistance with ADLs?: Yes Independently performs ADLs?: Yes (appropriate for  developmental age)  Prior Inpatient Therapy Prior Inpatient Therapy: Yes Prior Therapy Dates: 2006 Prior Therapy Facilty/Provider(s): [facility] Reason for Treatment: depression, SI  Prior Outpatient Therapy Prior Outpatient Therapy: Yes Prior Therapy Dates: 2006 Prior Therapy Facilty/Provider(s): unknown Reason for Treatment: depression Does patient have an ACCT team?: No Does patient have Intensive In-House Services?  : No Does patient have [facility] services? : No Does patient have P4CC services?: No  ADL Screening (condition at time of admission) Patient's cognitive ability adequate to safely complete daily activities?: Yes Is the patient deaf or have difficulty hearing?: No Does the patient have difficulty seeing, even when wearing glasses/contacts?: No Does the patient have difficulty concentrating, remembering, or making decisions?: No Patient able to express need for assistance with ADLs?: Yes Does the patient have difficulty dressing or bathing?: No Independently performs ADLs?: Yes (appropriate for developmental age)       Abuse/Neglect Assessment (Assessment to be complete while patient is alone) Abuse/Neglect Assessment Can Be Completed: Yes Physical Abuse: Denies Verbal Abuse: Denies Sexual Abuse: Denies Exploitation of patient/patient's resources: Denies     Merchant navy officer (For Healthcare) Does Patient Have a Medical Advance Directive?: No Would patient like information on creating a medical advance directive?: No - Patient declined          Disposition:  Disposition Initial Assessment Completed for this Encounter: Yes Disposition of Patient: Discharge  On Site Evaluation by:   Reviewed with Physician:    Emmit Pomfret 11/07/2018 1:49 PM

## 2018-11-07 NOTE — H&P (Signed)
Behavioral Health Medical Screening Exam  Lindsey [name] is an 33 y.o. female patient presents as walk in at [facility] with complaints of depression and feeling overwhelmed.  Patient states she has had some passive suicidal thoughts like not caring if she doesn't wake but does not want to die.  Patient states that she just wants everything to get better; Reporting losing her place to stay and living in a motel; change in job position both happening at same time was just overwhelming; now she has been out of work and was told if she didn't return tomorrow there was a possibility she could lose her job.  Patient states that she is able to contract for safety and if felt that she was going to kill or harm self she would call 911 or come to ED.  Patient states she will start the IOP program tomorrow morning at [facility] outpatient services.    Total Time spent with patient: 45 minutes  Psychiatric Specialty Exam: Physical Exam  Vitals reviewed. Constitutional: She is oriented to person, place, and time. She appears well-developed and well-nourished.  Morbid obese   Neck: Normal range of motion. Neck supple.  Respiratory: Effort normal.  Musculoskeletal: Normal range of motion.  Neurological: She is alert and oriented to person, place, and time.  Skin: Skin is warm and dry.  Psychiatric: Her speech is normal and behavior is normal. Judgment and thought content normal. Cognition and memory are normal. She exhibits a depressed mood (Stable).    Review of Systems  Psychiatric/Behavioral: Positive for depression (Stable). Negative for hallucinations, memory loss, substance abuse and suicidal ideas (States she has had passive thoughts of suicide; no plan or intent). The patient is not nervous/anxious and does not have insomnia.   All other systems reviewed and are negative.   Blood pressure (!) 175/86, pulse 86, temperature 98.4 F (36.9 C), resp. rate 16.There is no height or weight on file to calculate  BMI.  General Appearance: Casual and Neat  Eye Contact:  Good  Speech:  Clear and Coherent and Normal Rate  Volume:  Normal  Mood:  Depressed  Affect:  Congruent  Thought Process:  Coherent and Goal Directed  Orientation:  Full (Time, Place, and Person)  Thought Content:  WDL and Logical  Suicidal Thoughts:  Patient denies suicidal thoughts and states that she feels safe.  States she will be at [facility] otupatient tomorrow morning  Homicidal Thoughts:  No  Memory:  Immediate;   Good Recent;   Good Remote;   Good  Judgement:  Intact  Insight:  Present  Psychomotor Activity:  Normal  Concentration: Concentration: Good and Attention Span: Good  Recall:  Good  Fund of Knowledge:Good  Language: Good  Akathisia:  No  Handed:  Right  AIMS (if indicated):     Assets:  Communication Skills Desire for Improvement Housing Social Support  Sleep:       Musculoskeletal: Strength & Muscle Tone: within normal limits Gait & Station: normal Patient leans: N/A  Blood pressure (!) 175/86, pulse 86, temperature 98.4 F (36.9 C), resp. rate 16.  Recommendations:  IOP.  Appointment at [facility] tomorrow morning 8:30 am   Based on my evaluation the patient does not appear to have an emergency medical condition.   Disposition: No evidence of imminent risk to self or others at present.   Patient does not meet criteria for psychiatric inpatient admission. Supportive therapy provided about ongoing stressors. Discussed crisis plan, support from social network,  calling 911, coming to the Emergency Department, and calling Suicide Hotline.   , NP 11/07/2018, 1:14 PM

## 2018-11-08 ENCOUNTER — Telehealth (HOSPITAL_COMMUNITY): Payer: Self-pay | Admitting: Psychiatry

## 2018-11-08 ENCOUNTER — Other Ambulatory Visit (HOSPITAL_COMMUNITY): Payer: 59

## 2018-11-08 NOTE — Telephone Encounter (Signed)
D:  TTS referred pt to MH-IOP.  Pt arrived and completed all paperwork.  Writer oriented pt and provided her with an orientation folder with resources.  Pt is requesting a letter for [employer] excusing her from work October 24, 2018 thru current.  Explained to pt that writer could only back date through yesterday (when she was assessed); because she wasn't under MH-IOP services prior to then.  Pt then declined MH-IOP; states she would like individual therapy.  Pt then requested a return to work letter.  Reiterated to pt that since she's not planning to attend MH-IOP; she's not a patient so Clinical research associate can't provide her with a rtw letter. Pt denies SI/HI or A/V hallucinations at this time. A:  Provided pt with [facility] [place] phone numbers.  Referred pt to [facility], [facility] on 11-23-18 @ 8:30 a.m and [employer] (to see NP) on 11-22-18 @ 10:15 a.m.  Recommended that pt call PCP daily to see if he has an earlier appt so she can discuss excuse from work and rtw letter.  R:  Pt receptive.

## 2018-11-09 ENCOUNTER — Other Ambulatory Visit (HOSPITAL_COMMUNITY): Payer: 59

## 2018-11-12 ENCOUNTER — Other Ambulatory Visit (HOSPITAL_COMMUNITY): Payer: 59

## 2018-11-13 ENCOUNTER — Other Ambulatory Visit (HOSPITAL_COMMUNITY): Payer: 59

## 2018-11-14 ENCOUNTER — Other Ambulatory Visit (HOSPITAL_COMMUNITY): Payer: 59

## 2018-11-15 ENCOUNTER — Other Ambulatory Visit (HOSPITAL_COMMUNITY): Payer: 59

## 2018-11-16 ENCOUNTER — Other Ambulatory Visit (HOSPITAL_COMMUNITY): Payer: 59

## 2018-11-19 ENCOUNTER — Other Ambulatory Visit (HOSPITAL_COMMUNITY): Payer: 59

## 2018-11-20 ENCOUNTER — Other Ambulatory Visit (HOSPITAL_COMMUNITY): Payer: 59

## 2018-11-21 ENCOUNTER — Other Ambulatory Visit (HOSPITAL_COMMUNITY): Payer: 59

## 2018-11-22 ENCOUNTER — Other Ambulatory Visit (HOSPITAL_COMMUNITY): Payer: 59

## 2018-11-23 ENCOUNTER — Other Ambulatory Visit (HOSPITAL_COMMUNITY): Payer: 59

## 2018-11-26 ENCOUNTER — Other Ambulatory Visit (HOSPITAL_COMMUNITY): Payer: 59

## 2018-11-27 ENCOUNTER — Other Ambulatory Visit (HOSPITAL_COMMUNITY): Payer: 59

## 2018-11-28 ENCOUNTER — Other Ambulatory Visit (HOSPITAL_COMMUNITY): Payer: 59

## 2018-11-29 ENCOUNTER — Other Ambulatory Visit (HOSPITAL_COMMUNITY): Payer: 59

## 2018-11-30 ENCOUNTER — Other Ambulatory Visit (HOSPITAL_COMMUNITY): Payer: 59

## 2018-12-03 ENCOUNTER — Other Ambulatory Visit (HOSPITAL_COMMUNITY): Payer: 59

## 2018-12-04 ENCOUNTER — Other Ambulatory Visit (HOSPITAL_COMMUNITY): Payer: 59

## 2018-12-05 ENCOUNTER — Other Ambulatory Visit (HOSPITAL_COMMUNITY): Payer: 59

## 2018-12-06 ENCOUNTER — Other Ambulatory Visit (HOSPITAL_COMMUNITY): Payer: 59

## 2018-12-06 DIAGNOSIS — E1165 Type 2 diabetes mellitus with hyperglycemia: Secondary | ICD-10-CM | POA: Diagnosis not present

## 2018-12-07 ENCOUNTER — Other Ambulatory Visit (HOSPITAL_COMMUNITY): Payer: 59

## 2018-12-10 ENCOUNTER — Other Ambulatory Visit (HOSPITAL_COMMUNITY): Payer: 59

## 2018-12-11 ENCOUNTER — Other Ambulatory Visit (HOSPITAL_COMMUNITY): Payer: 59

## 2018-12-12 ENCOUNTER — Other Ambulatory Visit (HOSPITAL_COMMUNITY): Payer: 59

## 2018-12-13 ENCOUNTER — Other Ambulatory Visit (HOSPITAL_COMMUNITY): Payer: 59

## 2018-12-14 ENCOUNTER — Other Ambulatory Visit (HOSPITAL_COMMUNITY): Payer: 59

## 2018-12-17 ENCOUNTER — Other Ambulatory Visit (HOSPITAL_COMMUNITY): Payer: 59

## 2018-12-18 ENCOUNTER — Other Ambulatory Visit (HOSPITAL_COMMUNITY): Payer: 59

## 2018-12-19 ENCOUNTER — Other Ambulatory Visit (HOSPITAL_COMMUNITY): Payer: 59

## 2018-12-20 ENCOUNTER — Other Ambulatory Visit (HOSPITAL_COMMUNITY): Payer: 59

## 2018-12-20 DIAGNOSIS — I10 Essential (primary) hypertension: Secondary | ICD-10-CM | POA: Diagnosis not present

## 2018-12-20 DIAGNOSIS — E1169 Type 2 diabetes mellitus with other specified complication: Secondary | ICD-10-CM | POA: Diagnosis not present

## 2018-12-21 ENCOUNTER — Other Ambulatory Visit: Payer: Self-pay | Admitting: General Surgery

## 2018-12-21 ENCOUNTER — Other Ambulatory Visit (HOSPITAL_COMMUNITY): Payer: 59

## 2018-12-21 ENCOUNTER — Other Ambulatory Visit (HOSPITAL_COMMUNITY): Payer: Self-pay | Admitting: General Surgery

## 2018-12-24 ENCOUNTER — Other Ambulatory Visit (HOSPITAL_COMMUNITY): Payer: 59

## 2018-12-25 ENCOUNTER — Other Ambulatory Visit (HOSPITAL_COMMUNITY): Payer: 59

## 2018-12-26 ENCOUNTER — Other Ambulatory Visit (HOSPITAL_COMMUNITY): Payer: 59

## 2018-12-27 ENCOUNTER — Other Ambulatory Visit (HOSPITAL_COMMUNITY): Payer: 59

## 2018-12-28 ENCOUNTER — Other Ambulatory Visit (HOSPITAL_COMMUNITY): Payer: 59

## 2018-12-31 ENCOUNTER — Other Ambulatory Visit (HOSPITAL_COMMUNITY): Payer: 59

## 2019-01-01 ENCOUNTER — Other Ambulatory Visit (HOSPITAL_COMMUNITY): Payer: 59

## 2019-01-02 ENCOUNTER — Other Ambulatory Visit (HOSPITAL_COMMUNITY): Payer: 59

## 2019-01-15 ENCOUNTER — Ambulatory Visit: Payer: Self-pay | Admitting: Dietician

## 2019-01-18 ENCOUNTER — Encounter (HOSPITAL_COMMUNITY): Payer: Self-pay

## 2019-01-18 ENCOUNTER — Ambulatory Visit (HOSPITAL_COMMUNITY): Payer: 59

## 2019-04-15 ENCOUNTER — Institutional Professional Consult (permissible substitution): Payer: 59 | Admitting: Internal Medicine

## 2019-05-17 ENCOUNTER — Institutional Professional Consult (permissible substitution): Payer: Self-pay | Admitting: Internal Medicine

## 2019-05-20 ENCOUNTER — Institutional Professional Consult (permissible substitution): Payer: Self-pay | Admitting: Pulmonary Disease

## 2019-06-04 ENCOUNTER — Ambulatory Visit: Payer: 59 | Admitting: Psychiatry

## 2019-06-16 ENCOUNTER — Encounter (INDEPENDENT_AMBULATORY_CARE_PROVIDER_SITE_OTHER): Payer: Self-pay | Admitting: Internal Medicine

## 2019-06-16 DIAGNOSIS — M79672 Pain in left foot: Secondary | ICD-10-CM | POA: Insufficient documentation

## 2019-06-16 DIAGNOSIS — M549 Dorsalgia, unspecified: Secondary | ICD-10-CM | POA: Insufficient documentation

## 2019-06-16 DIAGNOSIS — M25852 Other specified joint disorders, left hip: Secondary | ICD-10-CM | POA: Insufficient documentation

## 2019-06-16 DIAGNOSIS — D259 Leiomyoma of uterus, unspecified: Secondary | ICD-10-CM | POA: Insufficient documentation

## 2019-06-16 DIAGNOSIS — G8929 Other chronic pain: Secondary | ICD-10-CM | POA: Insufficient documentation

## 2019-06-16 DIAGNOSIS — R030 Elevated blood-pressure reading, without diagnosis of hypertension: Secondary | ICD-10-CM | POA: Insufficient documentation

## 2019-06-16 DIAGNOSIS — G5622 Lesion of ulnar nerve, left upper limb: Secondary | ICD-10-CM | POA: Insufficient documentation

## 2019-06-17 ENCOUNTER — Telehealth (INDEPENDENT_AMBULATORY_CARE_PROVIDER_SITE_OTHER): Payer: Commercial Managed Care - POS | Admitting: Internal Medicine

## 2019-06-17 ENCOUNTER — Encounter (INDEPENDENT_AMBULATORY_CARE_PROVIDER_SITE_OTHER): Payer: Self-pay | Admitting: Internal Medicine

## 2019-06-17 DIAGNOSIS — L309 Dermatitis, unspecified: Secondary | ICD-10-CM

## 2019-06-17 MED ORDER — HYDROCORTISONE 2.5 % EX CREA
TOPICAL_CREAM | Freq: Two times a day (BID) | CUTANEOUS | 3 refills | Status: DC
Start: 2019-06-17 — End: 2020-07-06

## 2019-06-17 NOTE — Progress Notes (Signed)
[facility] INTERNAL MEDICINE - AN [place] [facility]                 Date of Virtual Visit: 06/17/2019 8:50 AM        Patient ID: Tracey Burns is a 33 y.o. female.  Attending Physician: Kaleen Odea, MD       Telemedicine Eligibility:    State Location:  [x]  [place]  []  [place]  []  [place] []  [place]  []  Other:    Physical Location:  [x]  Home  []  Work    []  Other:    Patient Identity Verification:  [x]  State Issued ID  []  Insurance Eligibility Check  []  Other:    Physical Address Verification: (for 911)  [x]  Yes  []  No    Personal identity shared with patient:  [x]  Yes  []  No    Education on nature of video visit shared with patient:  [x]  Yes  []  No    Verbal consent has been obtained from the patient to conduct a video and telephone visit to minimize exposure to COVID-19:   [x]  Yes  []  No    Language:  English . Translator was required:   [x]  Yes  []  No    Visit terminated since not appropriate for virtual care:  [x]  N/A  []  Reason:               Chief Complaint:    Chief Complaint   Patient presents with    Rash             HPI:    HPI   Patient noticed rash since March on and off in the left periorbital area, itchy not responding to over-the-counter hydrocortisone or other medications.  Prior history of eczema present, denies any fever, sore throat, cough or shortness of breath.         Problem List:    Patient Active Problem List   Diagnosis    Cholelithiasis without cholecystitis    Elevated blood pressure reading    Hip impingement syndrome, left    Hip pain, chronic, left    Left foot pain    Ulnar neuritis, left    Upper back pain on left side    Uterine leiomyoma, unspecified location             Current Meds:    No outpatient medications have been marked as taking for the 06/17/19 encounter (Telemedicine Visit) with Kaleen Odea, MD.          Allergies:    No Known Allergies          Past Surgical History:    History reviewed. No pertinent surgical history.         Family History:    Family History   Problem Relation Age of Onset    Cholelithiasis Mother     Hypertension Father     Heart attack Maternal Grandmother 49    Stroke Paternal Grandmother 54    Stroke Paternal Grandfather     Heart attack Paternal Grandfather     Skin cancer Paternal Grandfather            Social History:    Social History     Socioeconomic History    Marital status: Single     Spouse name: None    Number of children: None    Years of education: None    Highest education level: None   Occupational History    None  Social Engineer, site strain: None    Food insecurity     Worry: None     Inability: None    Transportation needs     Medical: None     Non-medical: None   Tobacco Use    Smoking status: Never Smoker    Smokeless tobacco: Never Used   Substance and Sexual Activity    Alcohol use: Never     Frequency: Never    Drug use: Never    Sexual activity: Yes     Partners: Male   Lifestyle    Physical activity     Days per week: None     Minutes per session: None    Stress: None   Relationships    Social connections     Talks on phone: None     Gets together: None     Attends religious service: None     Active member of club or organization: None     Attends meetings of clubs or organizations: None     Relationship status: None    Intimate partner violence     Fear of current or ex partner: None     Emotionally abused: None     Physically abused: None     Forced sexual activity: None   Other Topics Concern    None   Social History Narrative    None           The following sections were reviewed this encounter by the provider:   Tobacco   Allergies   Meds   Problems   Med Hx   Surg Hx   Fam Hx              Vital Signs:    There were no vitals taken for this visit.         ROS:    Review of Systems     General feeling fair, medications reviewed, Fever and medication side effects absent.  Skin raised erythematous macular rash present in the left periorbital area  mainly on the lateral aspect of eye socket.  HEENT no change in vision  Respiratory Cough and difficulty breathing not present  Cardiovascular chest pain or palpitation not present  Gastrointestinal abdomen pain not present  Musculoskeletal Muscle weakness and Myalgia not present  Neurological dizziness and headaches not present  Hematology abnormal bleeding not present            Physical Exam:    Physical Exam   General Mental status alert, appearance healthy, not in any acute distress, well nourished and developed with normal posture.  Skin no rashes  Eye  Sclera conjunctiva- bilateral normal  ENMT   normal ear appearance and hearing,normal appearance of nose,ears and lips including face.    Chest and lung exam  Normal respiratory effort with no use of access ary muscles noted, no audible wheeze noted     Limbs able to move all limbs    Neuropsychiatric  Mental status exam alert oriented x 3 with appropriate mood and affect.          Assessment/Plan:        Problem List Items Addressed This Visit     None      Visit Diagnoses     Eczema, unspecified type    -  Primary    Relevant Medications    hydrocortisone 2.5 % cream      Periorbital eczema, no vision affected.  Medication  as prescribed, advised follow-up if not better in 7 days.            Follow-up:    Return if symptoms worsen or fail to improve.         Danyel Griess Jennell Corner, MD     Time spent on telemedicine visit:15

## 2019-07-08 ENCOUNTER — Telehealth (INDEPENDENT_AMBULATORY_CARE_PROVIDER_SITE_OTHER): Payer: Commercial Managed Care - POS | Admitting: Physician Assistant

## 2019-07-08 ENCOUNTER — Encounter (INDEPENDENT_AMBULATORY_CARE_PROVIDER_SITE_OTHER): Payer: Self-pay | Admitting: Physician Assistant

## 2019-07-08 DIAGNOSIS — L309 Dermatitis, unspecified: Secondary | ICD-10-CM

## 2019-07-08 NOTE — Progress Notes (Signed)
[facility] INTERNAL MEDICINE - AN [place] [facility]                  Date of Exam: 07/08/2019 3:33 PM        Patient ID: Tracey Burns is a 33 y.o. female.      This visit is being conducted via video/telephone   Verbal consent has been obtained from the patient to conduct a telephone/video visit encounter to minimize exposure to COVID-19: yes      Chief Complaint:    Chief Complaint   Patient presents with    Rash             HPI:    HPI  06/17/2019 (Dr. Tonia Ghent):  Patient noticed rash since March on and off in the left periorbital area, itchy not responding to over-the-counter hydrocortisone or other medications.  Prior history of eczema present, denies any fever, sore throat, cough or shortness of breath.    07/08/2019 (Magdelene Ruark):  VV follow up.  Was prescribed hydrocortisone 2.5% cream at last visit, applied cream twice daily x 2 weeks with some improvement.  States symptoms returned after discontinuing cream.  She has also been applying aveeno eczema cream several times a day.  Hx of eczema usually on legs/arms, never on face.  Admits area is dry, flaky.   Denies pain, fever, chills, blistering, discharge, vision changes.            Problem List:    Patient Active Problem List   Diagnosis    Cholelithiasis without cholecystitis    Elevated blood pressure reading    Hip impingement syndrome, left    Hip pain, chronic, left    Left foot pain    Ulnar neuritis, left    Upper back pain on left side    Uterine leiomyoma, unspecified location             Current Meds:    Outpatient Medications Marked as Taking for the 07/08/19 encounter (Telemedicine Visit) with Ander Gaster, PA   Medication Sig Dispense Refill    fexofenadine (ALLEGRA) 180 MG tablet Take 180 mg by mouth daily      hydrocortisone 2.5 % cream Apply topically 2 (two) times daily 30 g 3    iron-vitamin C (Vitron-C) 65-125 MG Tab Take by mouth daily      Multiple Vitamins-Minerals (Multivitamin Adult) Tab Take 2 tablets by mouth daily             Allergies:    No Known Allergies           Social History:    Social History     Tobacco Use    Smoking status: Never Smoker    Smokeless tobacco: Never Used   Substance Use Topics    Alcohol use: Never     Frequency: Never             The following sections were reviewed this encounter by the provider:   Tobacco   Allergies   Meds   Problems   Med Hx   Surg Hx   Fam Hx                  ROS:    Review of Systems   Constitutional: Negative for chills and fever.   Eyes: Negative for pain, redness and visual disturbance.   Skin: Positive for color change and rash.              Physical Exam:  Physical Exam   GENERAL APPEARANCE: alert, in no acute distress, appearing at baseline  HEAD: normocephalic, atraumatic  SKIN: slightly raised erythematous macular appearing rash in the left periorbital area mainly on the lateral aspect of eye socket (per pt also present on eyelid), does not appear cellulitic/infectious  LUNGS: no use of accessory muscles in breathing  PSYCHIATRIC: alert, oriented, appropriate mood and affect          Assessment/Plan:    1. Dermatitis  - Ambulatory referral to Dermatology  - Ambulatory referral to Dermatology          Plan:    - intermittent x 6 months, eczema flare?  - symptoms initially responded to hydrocortisone 2.5% and then returned  - continue moisturizing often, recommended Hypoallergenic lotions/creams (Cetaphil, Aquaphor, Eucerin, Aveeno or Cerave).  Avoid perfumed products.  - given side effects of topical steroids especially around the eyes, recommended f/u with derm, referral in chart    This visit was completed virtually due to the restrictions in place due to the Covid-19 pandemic.  All issues documented were discussed and addressed .  Physical exam was limited to that allowed by the video platform.    Time spent in discussion: 5 to 10 minutes            Follow-up:    Return if symptoms worsen or fail to improve.         Ander Gaster, PA

## 2019-07-09 ENCOUNTER — Encounter (INDEPENDENT_AMBULATORY_CARE_PROVIDER_SITE_OTHER): Payer: Self-pay | Admitting: Physician Assistant

## 2019-07-09 NOTE — Progress Notes (Signed)
I have reviewed the note and based on the information provided I agree with the plan of care.

## 2019-08-13 ENCOUNTER — Institutional Professional Consult (permissible substitution): Payer: Self-pay | Admitting: Critical Care Medicine

## 2020-03-13 ENCOUNTER — Other Ambulatory Visit (INDEPENDENT_AMBULATORY_CARE_PROVIDER_SITE_OTHER): Payer: Self-pay | Admitting: Internal Medicine

## 2020-07-06 ENCOUNTER — Encounter (INDEPENDENT_AMBULATORY_CARE_PROVIDER_SITE_OTHER): Payer: Self-pay | Admitting: Physician Assistant

## 2020-07-06 ENCOUNTER — Ambulatory Visit (INDEPENDENT_AMBULATORY_CARE_PROVIDER_SITE_OTHER): Payer: Commercial Managed Care - POS | Admitting: Physician Assistant

## 2020-07-06 VITALS — BP 102/58 | HR 99 | Temp 98.4°F | Resp 18 | Ht 64.0 in | Wt 147.0 lb

## 2020-07-06 DIAGNOSIS — R102 Pelvic and perineal pain: Secondary | ICD-10-CM

## 2020-07-06 DIAGNOSIS — D259 Leiomyoma of uterus, unspecified: Secondary | ICD-10-CM

## 2020-07-06 LAB — COMPREHENSIVE METABOLIC PANEL
ALT: 21 U/L (ref 0–55)
AST (SGOT): 19 U/L (ref 5–34)
Albumin/Globulin Ratio: 1.5 (ref 0.9–2.2)
Albumin: 4 g/dL (ref 3.5–5.0)
Alkaline Phosphatase: 72 U/L (ref 37–117)
Anion Gap: 6 (ref 5.0–15.0)
BUN: 7 mg/dL (ref 7.0–19.0)
Bilirubin, Total: 0.5 mg/dL (ref 0.2–1.2)
CO2: 26 mEq/L (ref 21–29)
Calcium: 9.3 mg/dL (ref 8.5–10.5)
Chloride: 106 mEq/L (ref 100–111)
Creatinine: 0.7 mg/dL (ref 0.4–1.5)
Globulin: 2.7 g/dL (ref 2.0–3.7)
Glucose: 82 mg/dL (ref 70–100)
Potassium: 4.6 mEq/L (ref 3.5–5.1)
Protein, Total: 6.7 g/dL (ref 6.0–8.3)
Sodium: 138 mEq/L (ref 136–145)

## 2020-07-06 LAB — POCT URINALYSIS DIPSTIX (10)(MULTI-TEST)
Bilirubin, UA POCT: NEGATIVE
Blood, UA POCT: NEGATIVE
Glucose, UA POCT: NEGATIVE mg/dL
Ketones, UA POCT: NEGATIVE mg/dL
Nitrite, UA POCT: NEGATIVE
POCT Leukocytes, UA: NEGATIVE
POCT Spec Gravity, UA: 1.02 (ref 1.001–1.035)
POCT pH, UA: 7.5 (ref 5–8)
Protein, UA POCT: NEGATIVE mg/dL
Urobilinogen, UA: 0.2 mg/dL

## 2020-07-06 LAB — CBC AND DIFFERENTIAL
Absolute NRBC: 0 10*3/uL (ref 0.00–0.00)
Basophils Absolute Automated: 0.03 10*3/uL (ref 0.00–0.08)
Basophils Automated: 0.6 %
Eosinophils Absolute Automated: 0.07 10*3/uL (ref 0.00–0.44)
Eosinophils Automated: 1.5 %
Hematocrit: 42.2 % (ref 34.7–43.7)
Hgb: 14.3 g/dL (ref 11.4–14.8)
Immature Granulocytes Absolute: 0.01 10*3/uL (ref 0.00–0.07)
Immature Granulocytes: 0.2 %
Lymphocytes Absolute Automated: 1.33 10*3/uL (ref 0.42–3.22)
Lymphocytes Automated: 28.1 %
MCH: 30 pg (ref 25.1–33.5)
MCHC: 33.9 g/dL (ref 31.5–35.8)
MCV: 88.7 fL (ref 78.0–96.0)
MPV: 11.9 fL (ref 8.9–12.5)
Monocytes Absolute Automated: 0.49 10*3/uL (ref 0.21–0.85)
Monocytes: 10.4 %
Neutrophils Absolute: 2.8 10*3/uL (ref 1.10–6.33)
Neutrophils: 59.2 %
Nucleated RBC: 0 /100 WBC (ref 0.0–0.0)
Platelets: 288 10*3/uL (ref 142–346)
RBC: 4.76 10*6/uL (ref 3.90–5.10)
RDW: 12 % (ref 11–15)
WBC: 4.73 10*3/uL (ref 3.10–9.50)

## 2020-07-06 LAB — HEMOLYSIS INDEX: Hemolysis Index: 12 (ref 0–18)

## 2020-07-06 LAB — GFR: EGFR: >60

## 2020-07-06 NOTE — Progress Notes (Signed)
[facility] INTERNAL MEDICINE - AN [place] [facility]                  Date of Exam: 07/06/2020 9:49 PM        Patient ID: Tracey Burns is a 34 y.o. female.        Chief Complaint:    Chief Complaint   Patient presents with    Abdominal Pain     pt c/o tenderness in lower abdomen/pelvic area x3 months now             HPI:    Abdominal Pain  This is a new problem. Episode onset: 3 months. The onset quality is gradual. The problem occurs intermittently. The problem has been gradually worsening (increase frequency). The pain is located in the suprapubic region. The pain is at a severity of 1/10. The pain is mild ("discomfort"). Quality: "pressure" The abdominal pain radiates to the RLQ and LLQ. Associated symptoms include anorexia (decreased appetite d/t stress (work)) and nausea. Pertinent negatives include no belching, constipation, diarrhea, dysuria, fever, flatus, frequency, headaches, hematuria, melena, myalgias or vomiting. She has tried nothing for the symptoms.     Not currently sexually active. Period regular, q28 days, lasting 5-6 days.     Denies menstrual changes or increase cramping.  Denies pain association with menstruation.             Problem List:    Patient Active Problem List   Diagnosis    Cholelithiasis without cholecystitis    Hip impingement syndrome, left    Hip pain, chronic, left    Ulnar neuritis, left    Uterine leiomyoma, unspecified location             Current Meds:    Current Outpatient Medications   Medication Sig Dispense Refill    fexofenadine (ALLEGRA) 180 MG tablet Take 180 mg by mouth daily      iron-vitamin C (Vitron-C) 65-125 MG Tab Take by mouth daily      metroNIDAZOLE (METROCREAM) 0.75 % cream Apply topically 2 (two) times daily      Multiple Vitamins-Minerals (Multivitamin Adult) Tab Take 2 tablets by mouth daily       No current facility-administered medications for this visit.           Allergies:    No Known Allergies           Social History:    Social  History     Tobacco Use    Smoking status: Never Smoker    Smokeless tobacco: Never Used   Haematologist Use: Never used   Substance Use Topics    Alcohol use: Never    Drug use: Never              The following sections were reviewed this encounter by the provider:   Tobacco   Allergies   Meds   Problems   Med Hx   Surg Hx   Fam Hx              Vital Signs:    Vitals:    07/06/20 0928   BP: 102/58   BP Site: Right arm   Patient Position: Sitting   Cuff Size: Medium   Pulse: 99   Resp: 18   Temp: 98.4 F (36.9 C)   TempSrc: Oral   SpO2: 100%   Weight: 66.7 kg (147 lb)   Height: 1.626 m (5\' 4" )  ROS:    Review of Systems   Constitutional: Negative for chills and fever.   Respiratory: Negative for shortness of breath.    Cardiovascular: Negative for chest pain.   Gastrointestinal: Positive for abdominal pain, anorexia (decreased appetite d/t stress (work)) and nausea. Negative for blood in stool, constipation, diarrhea, flatus, melena and vomiting.   Genitourinary: Negative for dysuria, frequency, hematuria and menstrual problem.   Musculoskeletal: Negative for myalgias.   Skin: Negative for rash.   Neurological: Negative for headaches.              Physical Exam:    Physical Exam  Constitutional:       General: She is not in acute distress.     Appearance: Normal appearance. She is not ill-appearing.   HENT:      Head: Normocephalic and atraumatic.   Eyes:      Conjunctiva/sclera: Conjunctivae normal.   Cardiovascular:      Rate and Rhythm: Normal rate and regular rhythm.      Pulses: Normal pulses.      Heart sounds: Normal heart sounds.   Pulmonary:      Effort: Pulmonary effort is normal.      Breath sounds: Normal breath sounds.   Abdominal:      General: Bowel sounds are normal. There is no distension.      Palpations: Abdomen is soft. There is no mass.      Tenderness: There is abdominal tenderness in the suprapubic area and left lower quadrant. There is no guarding or rebound. Negative  signs include McBurney's sign.   Skin:     Findings: No rash.   Neurological:      Mental Status: She is alert.   Psychiatric:         Mood and Affect: Mood normal.         Behavior: Behavior normal.              Assessment/Plan:    1. Pelvic pain in female  Urine dipstick negative for all components  Low suspicion for acute abdomen given symptoms and exam findings, suspect gyn etiology (cysts vs fibroids), proceed with labs and ultrasound  Recommended OTC meds and heat compress for pain relief    - CBC and differential  - Comprehensive metabolic panel  - US Pelvis with Transvaginal; Future  - POCT UA Dipstix (10)(Multi-Test)    2. Uterine leiomyoma, unspecified location  Proceed with ultrasound to evaluate fibroids and can discuss further if needed with GYN at scheduled appointment next month     - US Pelvis with Transvaginal; Future          Plan:      Problem   Uterine Leiomyoma, Unspecified Location    4 cm and 6 cm, incidental finding on MRI (05/2017)               Follow-up:    Return if symptoms worsen or fail to improve.         Ander Gaster, PA

## 2020-07-06 NOTE — Progress Notes (Signed)
Results released/communicated to patient  See order details link under order report

## 2020-08-06 ENCOUNTER — Other Ambulatory Visit (INDEPENDENT_AMBULATORY_CARE_PROVIDER_SITE_OTHER): Payer: Self-pay | Admitting: Physician Assistant

## 2020-08-06 DIAGNOSIS — R102 Pelvic and perineal pain: Secondary | ICD-10-CM

## 2020-08-06 DIAGNOSIS — D259 Leiomyoma of uterus, unspecified: Secondary | ICD-10-CM

## 2020-08-06 NOTE — Progress Notes (Signed)
Results released/communicated to patient  See order details link under order report

## 2020-11-17 ENCOUNTER — Ambulatory Visit (INDEPENDENT_AMBULATORY_CARE_PROVIDER_SITE_OTHER): Payer: Commercial Managed Care - POS | Admitting: Physician Assistant

## 2020-11-17 ENCOUNTER — Other Ambulatory Visit (INDEPENDENT_AMBULATORY_CARE_PROVIDER_SITE_OTHER): Payer: Self-pay | Admitting: Physician Assistant

## 2020-11-17 ENCOUNTER — Encounter (INDEPENDENT_AMBULATORY_CARE_PROVIDER_SITE_OTHER): Payer: Self-pay | Admitting: Physician Assistant

## 2020-11-17 VITALS — BP 124/88 | HR 100 | Temp 97.8°F | Resp 18 | Ht 64.0 in | Wt 150.0 lb

## 2020-11-17 DIAGNOSIS — R059 Cough, unspecified: Secondary | ICD-10-CM

## 2020-11-17 DIAGNOSIS — R030 Elevated blood-pressure reading, without diagnosis of hypertension: Secondary | ICD-10-CM

## 2020-11-17 NOTE — Progress Notes (Signed)
Results released/communicated to patient  See order details link under order report

## 2020-11-17 NOTE — Progress Notes (Signed)
[facility] INTERNAL MEDICINE - AN [place] [facility]                  Date of Exam: 11/17/2020 11:23 PM        Patient ID: Tracey Burns is a 35 y.o. female.        Chief Complaint:    Chief Complaint   Patient presents with    Blood Pressure Check     no Covid symptoms.             HPI:    HPI     Here for follow up on elevated blood pressure reading.  Had elevated blood pressure at GYN appointment several weeks ago (150/90s) and was advised to follow up with PCP.  Has since been checking blood pressure at home, initially higher readings but has improved/decreased over the past week.  Home bp readings have been 120-130/70-80s.  Continues to follow healthy diet and exercise regularly; cardio/yoga several times per week.  Denies recent weight changes. Reports tested positive for covid about a month ago (end of December) and was taking decongestants daily for symptoms.      Also reports lingering cough from covid infection, remainder of symptoms resolved.  Denies cp, SOB, fever, chills.             Problem List:    Patient Active Problem List   Diagnosis    Cholelithiasis without cholecystitis    Hip impingement syndrome, left    Hip pain, chronic, left    Ulnar neuritis, left    Uterine leiomyoma, unspecified location             Current Meds:    Current Outpatient Medications   Medication Sig Dispense Refill    fexofenadine (ALLEGRA) 180 MG tablet Take 180 mg by mouth daily      iron-vitamin C (Vitron-C) 65-125 MG Tab Take by mouth daily      metroNIDAZOLE (METROCREAM) 0.75 % cream Apply topically 2 (two) times daily      Multiple Vitamins-Minerals (Multivitamin Adult) Tab Take 2 tablets by mouth daily      Sulfacetamide Sodium-Sulfur 10-5 % Liquid       Sulfacetamide Sodium-Sulfur 10-5 % Suspension        No current facility-administered medications for this visit.          Allergies:    No Known Allergies        Social History:    Social History     Tobacco Use    Smoking status: Never Smoker     Smokeless tobacco: Never Used   Haematologist Use: Never used   Substance Use Topics    Alcohol use: Never    Drug use: Never            The following sections were reviewed this encounter by the provider:   Tobacco   Allergies   Meds   Problems   Med Hx   Surg Hx   Fam Hx              Vital Signs:    BP 124/88    Pulse 100    Temp 97.8 F (36.6 C) (Temporal)    Resp 18    Ht 1.626 m (5\' 4" )    Wt 68 kg (150 lb)    SpO2 100%    BMI 25.75 kg/m      Home cuff reading - 122/84  ROS:    Review of Systems   Eyes: Negative for visual disturbance.   Respiratory: Negative for shortness of breath.    Cardiovascular: Negative for chest pain and leg swelling.   Neurological: Positive for headaches (no change from baseline).              Physical Exam:    Physical Exam  Constitutional:       Appearance: Normal appearance.   HENT:      Head: Normocephalic and atraumatic.   Eyes:      Conjunctiva/sclera: Conjunctivae normal.   Cardiovascular:      Rate and Rhythm: Normal rate and regular rhythm.      Heart sounds: Normal heart sounds.   Pulmonary:      Effort: Pulmonary effort is normal.      Breath sounds: Normal breath sounds.   Musculoskeletal:      Right lower leg: No edema.      Left lower leg: No edema.   Neurological:      Mental Status: She is alert.   Psychiatric:         Mood and Affect: Mood normal.         Behavior: Behavior normal.              Assessment:    1. Elevated blood-pressure reading without diagnosis of hypertension  Blood pressure log reviewed and most recent readings are controlled at home   Discussed initial elevated readings likely d/t recovery from covid/decongestant use  Encouraged to continue monitoring bp at home, low salt diet and regular exercise  Goal < 130 /80, follow up if bp starts trending upward    2. Cough  Persistent cough x 4 weeks, s/p covid infection 10/14/20 , recommend CXR     - X-ray Chest PA and Lateral; Future           Follow-up:    Return for Annual Physical.          Ander Gaster, PA

## 2021-06-21 ENCOUNTER — Encounter: Payer: Self-pay | Admitting: Neurology

## 2021-06-21 ENCOUNTER — Other Ambulatory Visit: Payer: Self-pay

## 2021-06-21 ENCOUNTER — Ambulatory Visit: Payer: 59 | Admitting: Neurology

## 2021-06-21 VITALS — BP 150/87 | HR 100 | Ht 66.0 in | Wt >= 6400 oz

## 2021-06-21 DIAGNOSIS — G932 Benign intracranial hypertension: Secondary | ICD-10-CM

## 2021-06-21 DIAGNOSIS — R0683 Snoring: Secondary | ICD-10-CM

## 2021-06-21 DIAGNOSIS — M5416 Radiculopathy, lumbar region: Secondary | ICD-10-CM

## 2021-06-21 MED ORDER — ACETAZOLAMIDE ER 500 MG PO CP12: 500.0000 mg | ORAL_CAPSULE | Freq: Three times a day (TID) | ORAL | 12 refills | Status: AC

## 2021-06-21 NOTE — Patient Instructions (Signed)
Increase Diamox to 500 mg three times per day, if you experience adverse effect (GI, abnormal taste), please decrease to 500 mg twice per day  Lumbar spine MRI to evaluate for lumbar radiculopathy  Referral to Neurology for sleep evaluation  Return in 1 year

## 2021-06-21 NOTE — Progress Notes (Signed)
[facility]  PATIENT: Lindsey [name] DOB: [birth year]  REFERRING CLINICIAN: Marlene Bast, OD HISTORY FROM: Patient  REASON FOR VISIT: Idiopathic Intracranial Hypertension    HISTORICAL  CHIEF COMPLAINT:  Chief Complaint  Patient presents with   New Patient (Initial Visit)    Room 12 - alone. Referred from optometrist for benign intracranial hypertension. Reports rare headaches - more of a "pulling sensation in the back of head". She is still having intermittent blurred vision. She has continued taking acetazolamide 500mg , one tab BID.     HISTORY OF PRESENT ILLNESS:  This is a 35 year old woman with PMHx of hypertension, Obesity, IIH who was referred by her OPH for Pseudotumor cerebri. Patient reports she was initially diagnosed in 2010, at that time she was complaining of blurred vision, back pain, headaches, had negative mri, and a Lumbar Puncture with high opening pressure. She states after her LP (I assume high volume tap), her back pain got better, headaches stopped, and she started on Diamox since 2010. She is currently on Diamox 500 mg BID. Since her initial diagnosis in 2010, she had bouts of severe back pain requiring LP which seems to resolve her symptoms. Her Diamox had never been increased.  Currently she denies visual obscuration, no headaches, but has pulling of her head, sharp pain for few second, about 3 times per time month.  She is following with OPH about once a year and per OPH note, "disc look much better than they did 5 years ago"  Patient denies loss of vision but states that blurred vision is worse, blurred vision when watching TV, hard to focus, she had changed her eyeglass prescription in May, but still experience the same.   She is also complaining of new back pain in the lower back and radiates down the left leg. It happened twice scine June, could not move for 2 weeks  Her PCP just went up on BP meds   Does not sleep that well,  stay up that night, goes to sleep about 3 AM and be up by 9AM, no naps during day. She works for [employer].     OTHER MEDICAL CONDITIONS: Hypertension, Obesity, IIH, Diabetes, Hypothyroidism, Anxiety/Depression.    REVIEW OF SYSTEMS: Full 14 system review of systems performed and negative with exception of: as noted in the HPI.   ALLERGIES: Allergies  Allergen Reactions   Bupropion Other (See Comments), Palpitations and Shortness Of Breath   Anti-Inflammatory Enzyme [Nutritional Supplements] Swelling    And fluid on the brain   Metformin Nausea Only and Other (See Comments)   Rosuvastatin Nausea And Vomiting    HOME MEDICATIONS: Outpatient Medications Prior to Visit  Medication Sig Dispense Refill   ALPRAZolam (XANAX) 0.25 MG tablet Take 0.25 mg by mouth daily.     ASHWAGANDHA PO Take 1,300 mg by mouth daily.     glimepiride (AMARYL) 2 MG tablet Take 2 mg by mouth daily.     irbesartan (AVAPRO) 300 MG tablet Take 300 mg by mouth daily.     levothyroxine (SYNTHROID) 50 MCG tablet Take 50 mcg by mouth daily.     Multiple Vitamin (MULTIVITAMIN) tablet Take 1 tablet by mouth daily.     pioglitazone (ACTOS) 45 MG tablet Take 45 mg by mouth daily.     pravastatin (PRAVACHOL) 20 MG tablet Take 20 mg by mouth daily.     acetaZOLAMIDE (DIAMOX) 500 MG capsule Take 1 capsule (500 mg total) by mouth 2 (two)  times daily. 60 capsule 2   acetaminophen (TYLENOL) 500 MG tablet Take 2,000 mg by mouth every 6 (six) hours as needed (for pain).     citalopram (CELEXA) 10 MG tablet Take 10 mg by mouth daily.     gabapentin (NEURONTIN) 300 MG capsule Take 300 mg by mouth 3 (three) times daily.     HYDROcodone-acetaminophen (NORCO/VICODIN) 5-325 MG tablet Take 1-2 tablets by mouth every 4 (four) hours as needed for moderate pain. 10 tablet 0   No facility-administered medications prior to visit.    PAST MEDICAL HISTORY: Past Medical History:  Diagnosis Date   Anxiety and depression     Diabetes (HCC)    Headache(784.0)    Hypercholesteremia    Hypertension    Hypothyroidism    Obesity    Panic disorder    Polycystic ovary    Pseudotumor cerebri     PAST SURGICAL HISTORY: Past Surgical History:  Procedure Laterality Date   LUMBAR PUNCTURE      FAMILY HISTORY: Family History  Problem Relation Age of Onset   Polycystic ovary syndrome Mother    Coronary artery disease Mother    Kidney disease Father    Narcolepsy Maternal Grandmother    Breast cancer Maternal Grandmother    Heart failure Maternal Grandfather    Cancer Other    Coronary artery disease Other    Diabetes Other     SOCIAL HISTORY: Social History   Socioeconomic History   Marital status: Single    Spouse name: Not on file   Number of children: 0   Years of education: 12   Highest education level: High school graduate  Occupational History   Occupation: Engineer, 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  Tobacco Use   Smoking status: Former    Packs/day: 0.25    Types: Cigarettes   Smokeless tobacco: Never  Substance and Sexual Activity   Alcohol use: No    Alcohol/week: 0.0 standard drinks   Drug use: Yes    Types: Marijuana    Comment: no longer using   Sexual activity: Yes    Partners: Male  Other Topics Concern   Not on file  Social History Narrative   Lives alone.   Right-handed.   No daily caffeine use.   Social Determinants of Health   Financial Resource Strain: Not on file  Food Insecurity: Not on file  Transportation Needs: Not on file  Physical Activity: Not on file  Stress: Not on file  Social Connections: Not on file  Intimate Partner Violence: Not on file     PHYSICAL EXAM  GENERAL EXAM/CONSTITUTIONAL: Vitals:  Vitals:   06/21/21 0817  BP: (!) 150/87  Pulse: 100  Weight: (!) 432 lb 8 oz (196.2 kg)  Height: 5\' 6"  (1.676 m)   Body mass index is 69.81 kg/m. Wt Readings from Last 3 Encounters:  06/21/21 (!) 432 lb 8 oz (196.2 kg)  07/02/16 (!) 430 lb (195 kg)   06/02/15 (!) 410 lb 4.8 oz (186.1 kg)   Patient is in no distress; well developed, nourished and groomed; neck is supple  CARDIOVASCULAR: Examination of carotid arteries is normal; no carotid bruits Regular rate and rhythm, no murmurs Examination of peripheral vascular system by observation and palpation is normal  EYES: Pupils round and reactive to light, Visual fields full to confrontation, Extraocular movements intacts,   MUSCULOSKELETAL: Gait, strength, tone, movements noted in Neurologic exam below  NEUROLOGIC: MENTAL STATUS:  awake, alert, oriented to person, place and  time recent and remote memory intact normal attention and concentration language fluent, comprehension intact, naming intact fund of knowledge appropriate  CRANIAL NERVE:  2nd - no papilledema or hemorrhages on fundoscopic exam 2nd, 3rd, 4th, 6th - pupils equal and reactive to light, extraocular muscles intact, no nystagmus. With the right eye, there is loss of nasal vision. Patient is following up with OPH.  5th - facial sensation symmetric 7th - facial strength symmetric 8th - hearing intact 9th - palate elevates symmetrically, uvula midline 11th - shoulder shrug symmetric 12th - tongue protrusion midline  MOTOR:  normal bulk and tone, full strength in the BUE, BLE  SENSORY:  normal and symmetric to light touch, pinprick, temperature, vibration  COORDINATION:  finger-nose-finger, fine finger movements normal  REFLEXES:  deep tendon reflexes present and symmetric  GAIT/STATION:  normal     DIAGNOSTIC DATA (LABS, IMAGING, TESTING) - I reviewed patient records, labs, notes, testing and imaging myself where available.  Lab Results  Component Value Date   WBC 11.6 (H) 07/03/2016   HGB 12.6 07/03/2016   HCT 39.3 07/03/2016   MCV 84.9 07/03/2016   PLT 358 07/03/2016      Component Value Date/Time   NA 134 (L) 07/03/2016 0004   K 4.3 07/03/2016 0004   CL 104 07/03/2016 0004   CO2 23  07/03/2016 0004   GLUCOSE 319 (H) 07/03/2016 0004   BUN 18 07/03/2016 0004   CREATININE 1.06 (H) 07/03/2016 0004   CALCIUM 9.4 07/03/2016 0004   PROT 6.7 09/09/2011 2152   ALBUMIN 3.0 (L) 09/09/2011 2152   AST 14 09/09/2011 2152   ALT 28 09/09/2011 2152   ALKPHOS 94 09/09/2011 2152   BILITOT 0.3 09/09/2011 2152   GFRNONAA >60 07/03/2016 0004   GFRAA >60 07/03/2016 0004   No results found for: CHOL, HDL, LDLCALC, LDLDIRECT, TRIG, CHOLHDL No results found for: LFYB0F No results found for: VITAMINB12 No results found for: TSH    ASSESSMENT AND PLAN  35 y.o. year old female with past medical history of idiopathic intracranial hypertension, essential hypertension, diabetes, hypothyroidism, obesity, anxiety and depression who presenting for her idiopathic intracranial hypertension.  Patient stated she has been diagnosed since 2010 after experiencing change in vision, back pain, headache, had a negative MRI brain with her lumbar puncture showed a high opening pressure.  She was diagnosed with IIH and started on Diamox 500 mg twice daily.  Patient stated since then her headaches are improving, back pain improved eventhough time time she will have severe back pain that we will require additional LPs.  She is also getting annual ophthalmology checkup.  Reported that her initial headache associated with IH resolved but now she is getting sharp head pulling, these episodes occur about 3 times a month.  She is also complaining of new back pain that is different from her back pain associated with IIH.  She stated this new back pain started in June and so far had 2 episodes and the pai does radiate to her left leg making ambulation very difficult.  She is also reporting new blurry vision difficulty focusing, she had new eyeglasses but her symptoms still present.  I recommended her to increase her Diamox to 500 mg 3 times a day and to watch out for side effects.  If she does have GI side effects or abnormal  taste then we can decrease it back to 500 mg twice a day.  Because of her new lumbar radiculopathy, I will order her a  MRI of the lumbar spine to look at the integrity of the spine and the nerve roots.  Patient is morbidly obese and she does complain of snoring I will refer her to sleep neurology for sleep apnea evaluation.  I will contact the patient after the MRI result otherwise she can follow-up in 1 year.  Diamox refilled.   1. Idiopathic intracranial hypertension   2. Lumbar radiculopathy   3. Snoring      PLAN:  Increase Diamox to 500 mg three times per day, if you experience adverse effect (GI, abnormal taste), please decrease to 500 mg twice per day  Lumbar spine MRI to evaluate for lumbar radiculopathy  Referral to Neurology for sleep evaluation  Return in 1 year   Orders Placed This Encounter  Procedures   MR LUMBAR SPINE WO CONTRAST   Ambulatory referral to Neurology    Meds ordered this encounter  Medications   acetaZOLAMIDE ER (DIAMOX) 500 MG capsule    Sig: Take 1 capsule (500 mg total) by mouth in the morning, at noon, and at bedtime.    Dispense:  90 capsule    Refill:  12    Return in about 1 year (around 06/21/2022).   Windell Norfolk, MD 06/21/2021, 2:17 PM  [facility] [address], [address] [place], [place] [address] [phone]

## 2021-06-22 ENCOUNTER — Telehealth: Payer: Self-pay | Admitting: Neurology

## 2021-06-22 NOTE — Telephone Encounter (Signed)
[employer] auth: [employer] via [employer] via [employer]. Order sent to GI. They will reach out to the patient to schedule.

## 2021-07-03 ENCOUNTER — Other Ambulatory Visit: Payer: Self-pay

## 2021-09-06 ENCOUNTER — Institutional Professional Consult (permissible substitution): Payer: 59 | Admitting: Neurology

## 2021-09-13 ENCOUNTER — Ambulatory Visit: Payer: Self-pay | Admitting: Cardiology

## 2021-10-29 DIAGNOSIS — E7800 Pure hypercholesterolemia, unspecified: Secondary | ICD-10-CM | POA: Insufficient documentation

## 2021-10-29 DIAGNOSIS — F410 Panic disorder [episodic paroxysmal anxiety] without agoraphobia: Secondary | ICD-10-CM | POA: Insufficient documentation

## 2021-10-29 DIAGNOSIS — E1169 Type 2 diabetes mellitus with other specified complication: Secondary | ICD-10-CM

## 2021-10-29 DIAGNOSIS — F419 Anxiety disorder, unspecified: Secondary | ICD-10-CM | POA: Insufficient documentation

## 2021-10-29 DIAGNOSIS — F324 Major depressive disorder, single episode, in partial remission: Secondary | ICD-10-CM | POA: Insufficient documentation

## 2021-10-29 DIAGNOSIS — E039 Hypothyroidism, unspecified: Secondary | ICD-10-CM | POA: Insufficient documentation

## 2021-10-29 DIAGNOSIS — I10 Essential (primary) hypertension: Secondary | ICD-10-CM | POA: Insufficient documentation

## 2021-10-29 HISTORY — DX: Type 2 diabetes mellitus with other specified complication: E11.69

## 2021-11-09 ENCOUNTER — Ambulatory Visit: Payer: Self-pay | Admitting: Cardiology

## 2022-02-14 ENCOUNTER — Telehealth (HOSPITAL_COMMUNITY): Payer: Self-pay | Admitting: Professional

## 2022-02-14 ENCOUNTER — Other Ambulatory Visit (HOSPITAL_COMMUNITY): Payer: 59 | Attending: Psychiatry | Admitting: Professional

## 2022-02-14 DIAGNOSIS — F332 Major depressive disorder, recurrent severe without psychotic features: Secondary | ICD-10-CM | POA: Insufficient documentation

## 2022-02-14 DIAGNOSIS — F4001 Agoraphobia with panic disorder: Secondary | ICD-10-CM | POA: Insufficient documentation

## 2022-02-15 ENCOUNTER — Telehealth (HOSPITAL_COMMUNITY): Payer: Self-pay | Admitting: Professional

## 2022-02-15 ENCOUNTER — Ambulatory Visit (HOSPITAL_COMMUNITY): Payer: 59

## 2022-02-15 ENCOUNTER — Other Ambulatory Visit (HOSPITAL_COMMUNITY): Payer: 59

## 2022-02-16 ENCOUNTER — Ambulatory Visit (HOSPITAL_COMMUNITY): Payer: 59

## 2022-02-16 ENCOUNTER — Other Ambulatory Visit (HOSPITAL_COMMUNITY): Payer: 59

## 2022-02-17 ENCOUNTER — Other Ambulatory Visit (HOSPITAL_COMMUNITY): Payer: 59

## 2022-02-17 ENCOUNTER — Ambulatory Visit (HOSPITAL_COMMUNITY): Payer: 59

## 2022-02-18 ENCOUNTER — Other Ambulatory Visit (HOSPITAL_COMMUNITY): Payer: 59

## 2022-02-21 ENCOUNTER — Other Ambulatory Visit (HOSPITAL_COMMUNITY): Payer: 59

## 2022-02-21 ENCOUNTER — Encounter (HOSPITAL_COMMUNITY): Payer: Self-pay

## 2022-02-21 ENCOUNTER — Other Ambulatory Visit (HOSPITAL_COMMUNITY): Payer: 59 | Attending: Psychiatry | Admitting: Licensed Clinical Social Worker

## 2022-02-21 ENCOUNTER — Encounter (HOSPITAL_COMMUNITY): Payer: Self-pay | Admitting: Professional

## 2022-02-21 DIAGNOSIS — F4310 Post-traumatic stress disorder, unspecified: Secondary | ICD-10-CM | POA: Insufficient documentation

## 2022-02-21 DIAGNOSIS — R4589 Other symptoms and signs involving emotional state: Secondary | ICD-10-CM

## 2022-02-21 DIAGNOSIS — F332 Major depressive disorder, recurrent severe without psychotic features: Secondary | ICD-10-CM

## 2022-02-21 DIAGNOSIS — F410 Panic disorder [episodic paroxysmal anxiety] without agoraphobia: Secondary | ICD-10-CM | POA: Diagnosis present

## 2022-02-21 DIAGNOSIS — F4001 Agoraphobia with panic disorder: Secondary | ICD-10-CM | POA: Diagnosis not present

## 2022-02-21 NOTE — Therapy (Signed)
[facility] ?[address] ?[place], [place], [address] ?Phone: [phone]   Fax:  [phone] ? ?Occupational Therapy Evaluation ? ?Virtual Visit via Video Note ? ?I connected with [name] on 02/21/22 at  8:00 AM EDT by a video enabled telemedicine application and verified that I am speaking with the correct person using two identifiers. ? ?Location: ?Patient: home ?Provider: office ?  ?I discussed the limitations of evaluation and management by telemedicine and the availability of in person appointments. The patient expressed understanding and agreed to proceed. ? ?  ?The patient was advised to call back or seek an in-person evaluation if the symptoms worsen or if the condition fails to improve as anticipated. ? ?I provided 90 minutes of non-face-to-face time during this encounter. ? ? ?Patient Details  ?Name: Lindsey [name] ?MRN: [id number] ?Date of Birth: 09/14/1986 ?No data recorded ? ?Encounter Date: 02/21/2022 ? ? OT End of Session - 02/21/22 2031   ? ? Visit Number 1   ? Number of Visits 20   ? Date for OT Re-Evaluation 03/23/22   ? Authorization Type [employer] [facility]   ? Authorization Time Period working on benefits verification   ? OT Start Time 0915   ? OT Stop Time 1200   ? OT Time Calculation (min) 165 min   ? Equipment Utilized During Treatment power point / webx   ? Activity Tolerance Patient tolerated treatment well   ? Behavior During Therapy [facility] for tasks assessed/performed   ? ?  ?  ? ?  ? ? ?Past Medical History:  ?Diagnosis Date  ? Anxiety and depression   ? Depression 02/22/2015  ? Diabetes (HCC)   ? Headache(784.0)   ? High cholesterol   ? Hypercholesteremia   ? Hyperlipidemia   ? Hypertension   ? Hypothyroidism   ? Obesity   ? Panic disorder   ? Polycystic ovary   ? Pseudotumor cerebri   ? PTSD (post-traumatic stress disorder)   ? ? ?Past Surgical History:  ?Procedure Laterality Date  ? LUMBAR PUNCTURE     ? ? ?There were no vitals filed for this visit. ? ? Subjective Assessment - 02/21/22 2030   ? ? Currently in Pain? No/denies   ? Pain Score 0-No pain   ? Multiple Pain Sites No   ? ?  ?  ? ?  ? ? ? ?OT Assessment ? 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 ?OCAIRS Mental Health Interview Summary of Client Scores: ? Facilitates participation in occupation Allows participation in occupation Inhibits participation in occupation Restricts participation in occupation Comments:  ?Roles   X    ?Habits   X    ?Personal Causation  X     ?Values   X    ?Interests    X   ?Skills   X    ?Short-Term Goals  X     ?  Long-term Goals    X   ?Interpretation of Past Experiences   X    ?Physical Environment   X    ?Social Environment    X   ?Readiness for Change   X    ? ? Need for Occupational Therapy: ?    ?    ? 2 Need for OT intervention indicated to restore/improve participation  ?    ? ?Objective: O:  ? ?During today's OT group, the occupational therapist discussed the impact of sleep disturbances on daily activities and overall health and wellbeing.  ? ?The OT also reviewed various types of sleep disorders, including insomnia, sleep apnea, restless leg syndrome, and narcolepsy, and their associated symptoms. Strategies for managing and treating sleep disturbances were also discussed, such as establishing a consistent sleep  routine, avoiding stimulants before bedtime, and engaging in relaxation techniques.  ? ?Today's group also included information on how sleep disturbances can cause fatigue, mood changes, cognitive impairment, and physical health problems, and emphasizes the importance of seeking prompt treatment to maintain overall health and wellbeing. ? ?Assessment:  Patient demonstrates behavior that INHIBITS participation in occupation.  Patient will benefit from occupational therapy intervention in order to improve time management, financial management, stress management, job readiness skills, social skills, and health management skills in preparation to return to full time community living and to be a productive community member.   ? ?Plan:  Patient will participate in skilled occupational therapy sessions individually or in a group setting to improve coping skills, psychosocial skills, and emotional skills required to return to prior level of function. ?Treatment will be 4-5 times per week for 4 weeks.   ? ? ? ? ? ? ? ? ? ? ? ? ? ? ? ? ? ? ? OT Education - 02/21/22 2030   ? ? Education Details OT Role / Sleep Hygiene   ? Person(s) Educated Patient   ? Methods Explanation   ? Comprehension Verbalized understanding   ? ?  ?  ? ?  ? ? ? OT Short Term Goals - 02/21/22 2037   ? ?  ? OT SHORT TERM GOAL #1  ? Title Pt will be independnet w/ education and coping skills to use interventions learned in OT group in order to return to successful community living   ? Time 4   ? Period Weeks   ? Status New   ? Target Date 03/23/22   ?  ? OT SHORT TERM GOAL #2  ? Title Pt will demonstrate independence in goal setting skills in order to achieve independence in community re-entry   ?  ? OT SHORT TERM GOAL #3  ? Title pt will be independent w/ gradual exposure skills in order to independently and successfully engage in community re-entry.   ? ?  ?  ? ?  ? ? ? ? ? ? ? ? ? ? ? Plan - 02/21/22 2034   ? ? OT Occupational Profile and History  Problem Focused Assessment - Including review of records relating to presenting problem   ? Occupational performance deficits (Please refer to evaluation for details): IADL's;Leisure;Social Participation   ? Psychosocial Skills Coping Strategies;Habits;Interpersonal Interaction;Routines and Behaviors   ? Rehab Potential Good   ? Clinical Decision Making Limited treatment options, no task modification necessary   ? Comorbidities Affecting Occupational Performance: None   ? Modification or Assistance to Complete Evaluation  No modification of tasks or assist necessary to complete eval   ? OT  Frequency 5x / week   ? OT Duration 4 weeks   ? OT Treatment/Interventions Psychosocial skills training;Coping strategies training   ? Consulted and Agree with Plan of Care Patient   ? ?  ?  ? ?  ? ? ?Patient will benefit from skilled therapeutic intervention in order to improve the following deficits and impairments:   ?  ?  ?Psychosocial Skills: Coping Strategies, Habits, Interpersonal Interaction, Routines and Behaviors ? ? ?Visit Diagnosis: ?Difficulty coping ? ?Severe episode of recurrent major depressive disorder, without psychotic features (HCC) ? ?Panic disorder with agoraphobia ? ? ? ?Problem List ?Patient Active Problem List  ? Diagnosis Date Noted  ? Major depressive disorder, recurrent episode, severe (HCC) 02/14/2022  ? Panic disorder with agoraphobia 02/14/2022  ? Type 2 diabetes mellitus with other specified complication (HCC) 10/29/2021  ? Anxiety 10/29/2021  ? High cholesterol 10/29/2021  ? Hypertension 10/29/2021  ? Hypothyroidism 10/29/2021  ? Major depression in partial remission (HCC) 10/29/2021  ? Panic disorder 10/29/2021  ? Morbid obesity (HCC) 10/29/2021  ? Idiopathic intracranial hypertension 06/02/2015  ? Depression 02/22/2015  ? Pseudotumor cerebri 09/09/2011  ? Obesity, morbid (HCC) 09/09/2011  ? Vision disturbance 09/09/2011  ? ? ?Ted McalpineEdward G Hollan, OT ?02/21/2022, 8:42 PM ? ?Kerrin ChampagneEdward Hollan, OT ? ? ?[facility] ?[facility] ?[address] ?[place], [place], [id number] ?Phone: [phone]   Fax:  [phone] ? ?Name: Alvino BloodSierra Windish ?MRN: [id number] ?Date of Birth: [birth year]
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
## 2022-02-21 NOTE — Psych (Signed)
Virtual Visit via Video Note ? ?I connected with Lindsey [name] on 02/14/22 at 10:00 AM EDT by a video enabled telemedicine application and verified that I am speaking with the correct person using two identifiers. ? ?Location: ?Patient: home ?Provider: Clinical Home Office ?  ?I discussed the limitations of evaluation and management by telemedicine and the availability of in person appointments. The patient expressed understanding and agreed to proceed. ? ?Follow Up Instructions: ? ?  ?I discussed the assessment and treatment plan with the patient. The patient was provided an opportunity to ask questions and all were answered. The patient agreed with the plan and demonstrated an understanding of the instructions. ?  ?The patient was advised to call back or seek an in-person evaluation if the symptoms worsen or if the condition fails to improve as anticipated. ? ?I provided 60 minutes of non-face-to-face time during this encounter. ? ? ?Quinn Axe, [facility] ? ? ? ? ? ?Comprehensive Clinical Assessment (CCA) Note ? ? ?Lindsey [name] ?[id number] ? ?Chief Complaint:  ?Chief Complaint  ?Patient presents with  ? Depression  ? Anxiety  ? Follow-up  ?  Hospital d/c  ? ?Visit Diagnosis: MDD, Panic D/O with agoraphobia  ? ? ?CCA Screening, Triage and Referral (STR) ? ?Patient Reported Information ?How did you hear about [name]? Hospital Discharge ? ?Referral name: [facility] ? ?Referral phone number: No data recorded ? ?Whom do you see for routine medical problems? Primary Care ? ?Practice/Facility Name: [facility] ? ?Practice/Facility Phone Number: No data recorded ?Name of Contact: No data recorded ?Contact Number: No data recorded ?Contact Fax Number: No data recorded ?Prescriber Name: No data recorded ?Prescriber Address (if known): No data recorded ? ?What Is the Reason for Your Visit/Call Today? depression and anxiety ? ?How Long Has This Been Causing You Problems? > than 6  months ? ?What Do You Feel Would Help You the Most Today? Treatment for Depression or other mood problem ? ? ?Have You Recently Been in Any Inpatient Treatment (Hospital/Detox/Crisis Center/28-Day Program)? Yes ? ?Name/Location of Program/Hospital:[facility] ? ?How Long Were You There? 2 weeks April 10-April 21 ? ?When Were You Discharged? 02/11/22 ? ? ?Have You Ever Received Services From [employer] Before? Yes ? ?Who Do You See at [facility]? No data recorded ? ?Have You Recently Had Any Thoughts About Hurting Yourself? No ? ?Are You Planning to Commit Suicide/Harm Yourself At This time? No ? ? ?Have you Recently Had Thoughts About Hurting Someone Karolee Ohs? No ? ?Explanation: No data recorded ? ?Have You Used Any Alcohol or Drugs in the Past 24 Hours? No ? ?How Long Ago Did You Use Drugs or Alcohol? No data recorded ?What Did You Use and How Much? No data recorded ? ?Do You Currently Have a Therapist/Psychiatrist? Yes ? ?Name of Therapist/Psychiatrist: Jenelle Swaziland "I don't see her regularly" last visit 3/21- prior to that it had been 2-3 months ? ? ?Have You Been Recently Discharged From Any Office Practice or Programs? No ? ?Explanation of Discharge From Practice/Program: No data recorded ? ?  ?CCA Screening Triage Referral Assessment ?Type of Contact: Tele-Assessment ? ?Is this Initial or Reassessment? Initial Assessment ? ?Date Telepsych consult ordered in [facility]:  No data recorded ?Time Telepsych consult ordered in [facility]L:  No data recorded ? ?Patient Reported Information Reviewed? No data recorded ?Patient Left Without Being Seen? No data recorded ?Reason for Not Completing Assessment: No data recorded ? ?Collateral Involvement: notes ? ? ?  Does Patient Have a Automotive engineer Guardian? No data recorded ?Name and Contact of Legal Guardian: No data recorded ?If Minor and Not Living with Parent(s), Who has Custody? No data recorded ?Is CPS involved or ever been involved? Never ? ?Is APS  involved or ever been involved? Never ? ? ?Patient Determined To Be At Risk for Harm To Self or Others Based on Review of Patient Reported Information or Presenting Complaint? No data recorded ?Method: No data recorded ?Availability of Means: No data recorded ?Intent: No data recorded ?Notification Required: No data recorded ?Additional Information for Danger to Others Potential: No data recorded ?Additional Comments for Danger to Others Potential: No data recorded ?Are There Guns or Other Weapons in Your Home? No data recorded ?Types of Guns/Weapons: No data recorded ?Are These Weapons Safely Secured?                            No data recorded ?Who Could Verify You Are Able To Have These Secured: No data recorded ?Do You Have any Outstanding Charges, Pending Court Dates, Parole/Probation? No data recorded ?Contacted To Inform of Risk of Harm To Self or Others: No data recorded ? ?Location of Assessment: Other (comment) ? ? ?Does Patient Present under Involuntary Commitment? No ? ?IVC Papers Initial File Date: No data recorded ? ?[place] of Residence: [place] ? ? ?Patient Currently Receiving the Following Services: Individual Therapy ? ? ?Determination of Need: Routine (7 days) ? ? ?Options For Referral: Partial Hospitalization ? ? ? ? ?CCA Biopsychosocial ?Intake/Chief Complaint:  Pt reports per d/c from hospital in [place], [place]. 1) Passive SI: ?Things would be easier if I wasn?t here.? 2) ?I am afraid to leave my home because I was homeless for a while. Something happened to me while I was homeless.? Homeless in 2019-2020 Pt declines to go into what happened to her. 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 It?s triggering.? 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 ?I didn?t have this problem prior to 2020.? Pt reports she will take time off which she doesn?t get paid for then she struggles to pay her bills. 4) People scare me: Men  scare me. 5) Pt reports she did not leave her home for 3 years. Pt reports she could not even take her trash out. Pt reports her family came over and finally cleaned the trash out because the odor was so strong. Pt reports family came over last year.  Pt reports treatment history includes therapy on/off. Pt reports hospitalization in 2006 due to suicide attempt by overdose; 2020 due to suicidal ideation with plan to shot self; 2023 due to attempt by overdose. Pt reports dx as agoraphobia, PTSD, MDD, and GAD. Pt reports continued passive SI; denies HI/AVH. Pt lives alone. Pt reports ?no one? as supports. Medical diagnoses include diabetes; hypothyroidism; pseudotumor seribry; PCOS; high [name] pressure; high cholesterol. ? ?Current Symptoms/Problems: passive SI; wont leave house; intense anxiety; panic attacks (last: 02/10/22- lasts 27m-1h; several times a week); insomnia (avg 2 hours a day); concentration issues; obsessive thoughts about current problem and resolutions; overthinking; hopeless; worthlessness;  ADLs decreased (showering; cleaning; cooking); low motivation; low energy ? ? ?Patient Reported Schizophrenia/Schizoaffective Diagnosis in Past: No data recorded ? ?Strengths: motivation for treatment ? ?Preferences: to get better ? ?Abilities: can attend and participate in treatment ? ? ?Type of Services Patient Feels are  Needed: PHP ? ? ?Initial Clinical Notes/Concerns: No data recorded ? ?Mental Health Symptoms ?Depression:   ?Change in energy/activity; Difficulty Concentrating; Fatigue; Hopelessness; Irritability; Tearfulness; Sleep (too much or little); Worthlessness ?  ?Duration of Depressive symptoms:  ?Greater than two weeks ?  ?Mania:   ?None ?  ?Anxiety:    ?Difficulty concentrating; Fatigue; Irritability; Restlessness; Worrying; Tension ?  ?Psychosis:   ?None ?  ?Duration of Psychotic symptoms: No data recorded  ?Trauma:   ?None ?  ?Obsessions:   ?None ?  ?Compulsions:   ?None ?  ?Inattention:    ?None ?  ?Hyperactivity/Impulsivity:   ?None ?  ?Oppositional/Defiant Behaviors:   ?None ?  ?Emotional Irregularity:   ?Chronic feelings of emptiness; Mood lability ?  ?Other Mood/Personality Symptoms:  No data recorded

## 2022-02-21 NOTE — Plan of Care (Signed)
?  Problem: Depression CCP Problem  1  ?Goal: STG: [place] WILL ATTEND AT LEAST 80% OF SCHEDULED PHP SESSIONS ?Outcome: Not Applicable ?Goal: STG: Home 80% OF ASSIGNED HOMEWORK ?Outcome: Not Applicable ?Goal: STG: Reduce overall depression score by a minimum of 25% on the Patient Health Questionnaire (PHQ-9) or the Montgomery-Asberg Depression Rating Scale (MADRS) ?Outcome: Not Applicable ?Goal: STG: [employer] 3 COGNITIVE PATTERNS AND BELIEFS THAT SUPPORT DEPRESSION ?Outcome: Not Applicable ?  ? ?Pt verbally agrees to treatment plan. ?

## 2022-02-22 ENCOUNTER — Other Ambulatory Visit (HOSPITAL_COMMUNITY): Payer: 59

## 2022-02-22 ENCOUNTER — Encounter (HOSPITAL_COMMUNITY): Payer: Self-pay

## 2022-02-22 ENCOUNTER — Other Ambulatory Visit (HOSPITAL_COMMUNITY): Payer: 59 | Admitting: Licensed Clinical Social Worker

## 2022-02-22 DIAGNOSIS — F332 Major depressive disorder, recurrent severe without psychotic features: Secondary | ICD-10-CM | POA: Diagnosis not present

## 2022-02-22 DIAGNOSIS — R4589 Other symptoms and signs involving emotional state: Secondary | ICD-10-CM

## 2022-02-22 DIAGNOSIS — F4001 Agoraphobia with panic disorder: Secondary | ICD-10-CM

## 2022-02-22 NOTE — Therapy (Signed)
[place] ?[facility] ?[place], [place], [address] ?Phone: [phone]   Fax:  [phone] ? ?Occupational Therapy Treatment ? ?Virtual Visit via Video Note ? ?I connected with Cornelia Copa on 02/22/22 at  8:00 AM EDT by a video enabled telemedicine application and verified that I am speaking with the correct person using two identifiers. ? ?Location: ?Patient: home ?Provider: office ?  ?I discussed the limitations of evaluation and management by telemedicine and the availability of in person appointments. The patient expressed understanding and agreed to proceed. ? ?  ?The patient was advised to call back or seek an in-person evaluation if the symptoms worsen or if the condition fails to improve as anticipated. ? ?I provided 60 minutes of non-face-to-face time during this encounter. ? ? ?Patient Details  ?Name: Lindsey [name] ?MRN: [id number] ?Date of Birth: 1986-07-09 ?No data recorded ? ?Encounter Date: 02/22/2022 ? ? OT End of Session - 02/22/22 1238   ? ? Visit Number 2   ? Number of Visits 20   ? Date for OT Re-Evaluation 03/23/22   ? Authorization Type [employer] [facility]   ? Authorization Time Period working on benefits verification   ? OT Start Time 1100   ? OT Stop Time 1200   ? OT Time Calculation (min) 60 min   ? ?  ?  ? ?  ? ? ?Past Medical History:  ?Diagnosis Date  ? Anxiety and depression   ? Depression 02/22/2015  ? Diabetes ([place])   ? Headache(784.0)   ? High cholesterol   ? Hypercholesteremia   ? Hyperlipidemia   ? Hypertension   ? Hypothyroidism   ? Obesity   ? Panic disorder   ? Polycystic ovary   ? Pseudotumor cerebri   ? PTSD (post-traumatic stress disorder)   ? ? ?Past Surgical History:  ?Procedure Laterality Date  ? LUMBAR PUNCTURE    ? ? ?There were no vitals filed for this visit. ? ? Subjective Assessment - 02/22/22 1237   ? ? Currently in Pain? No/denies   ? Pain Score 0-No pain   ? ?  ?  ? ?   ? ? ? ? ? ? ?Group Session: ? ?S: "I got better sleep last night. I feel more well rested today." ? ?O: During today's group therapy session, the occupational therapist discussed the impact of sleep disturbances on daily activities and overall health and wellbeing.  ? ?The OT also reviewed various types of sleep disorders, including insomnia, sleep apnea, restless leg syndrome, and narcolepsy, and their associated symptoms. Strategies for managing and treating sleep disturbances were also discussed, such as establishing a consistent sleep routine, avoiding stimulants before bedtime, and engaging in relaxation techniques.  ? ?Today's group also included information on how sleep disturbances can cause fatigue, mood changes, cognitive impairment, and physical health problems, and emphasizes the importance of seeking prompt treatment to maintain overall health and wellbeing. ? ? ?A: Based on today's performance, [name] would continue to benefit from cont'd PHP OT group therapy intervention  in order to address the deficits that impact and inhibit independence in community re-entry and psychosocial dynamics / skills.  ? ?P: Continue to attend PHP OT group sessions 5x week for 4 weeks to promote daily structure, social engagement, and opportunities to develop and utilize adaptive strategies to maximize functional performance in preparation for safe transition and integration back into school, work, and the community. Plan to address topic of self  care in next OT group session. ? ? ? ? ? ? ? ? ? ? ? ? ? ? ? ? ? OT Education - 02/22/22 1238   ? ? Education Details OT Role / Sleep Hygiene   ? ?  ?  ? ?  ? ? ? OT Short Term Goals - 02/21/22 2037   ? ?  ? OT SHORT TERM GOAL #1  ? Title Pt will be independnet w/ education and coping skills to use interventions learned in OT group in order to return to successful community living   ? Time 4   ? Period Weeks   ? Status New   ? Target Date 03/23/22   ?  ? OT SHORT TERM GOAL #2  ?  Title Pt will demonstrate independence in goal setting skills in order to achieve independence in community re-entry   ?  ? OT SHORT TERM GOAL #3  ? Title pt will be independent w/ gradual exposure skills in order to independently and successfully engage in community re-entry.   ? ?  ?  ? ?  ? ? ? ? ? ? ? ? ? ? ? Plan - 02/22/22 1238   ? ? Psychosocial Skills Coping Strategies;Habits;Interpersonal Interaction;Routines and Behaviors   ? ?  ?  ? ?  ? ? ?Patient will benefit from skilled therapeutic intervention in order to improve the following deficits and impairments:   ?  ?  ?Psychosocial Skills: Coping Strategies, Habits, Interpersonal Interaction, Routines and Behaviors ? ? ?Visit Diagnosis: ?Difficulty coping ? ? ? ?Problem List ?Patient Active Problem List  ? Diagnosis Date Noted  ? Major depressive disorder, recurrent episode, severe ([place]) 02/14/2022  ? Panic disorder with agoraphobia 02/14/2022  ? Type 2 diabetes mellitus with other specified complication ([place]) 123456  ? Anxiety 10/29/2021  ? High cholesterol 10/29/2021  ? Hypertension 10/29/2021  ? Hypothyroidism 10/29/2021  ? Major depression in partial remission ([place]) 10/29/2021  ? Panic disorder 10/29/2021  ? Morbid obesity ([place]) 10/29/2021  ? Idiopathic intracranial hypertension 06/02/2015  ? Depression 02/22/2015  ? Pseudotumor cerebri 09/09/2011  ? Obesity, morbid ([place]) 09/09/2011  ? Vision disturbance 09/09/2011  ? ? ?Brantley Stage, OT ?02/22/2022, 12:39 PM ? ?Cornell Barman, OT ? ? ?[place] ?[facility]GRAM ?[place], [place], [address] ?Phone: [phone]   Fax:  [phone] ? ?Name: Lindsey [name] ?MRN: [id number] ?Date of Birth: Jun 08, 1986 ? ?

## 2022-02-23 ENCOUNTER — Other Ambulatory Visit (HOSPITAL_COMMUNITY): Payer: 59 | Admitting: Licensed Clinical Social Worker

## 2022-02-23 ENCOUNTER — Encounter (HOSPITAL_COMMUNITY): Payer: Self-pay

## 2022-02-23 ENCOUNTER — Other Ambulatory Visit (HOSPITAL_COMMUNITY): Payer: 59

## 2022-02-23 DIAGNOSIS — F332 Major depressive disorder, recurrent severe without psychotic features: Secondary | ICD-10-CM | POA: Diagnosis not present

## 2022-02-23 DIAGNOSIS — R4589 Other symptoms and signs involving emotional state: Secondary | ICD-10-CM

## 2022-02-23 DIAGNOSIS — F4001 Agoraphobia with panic disorder: Secondary | ICD-10-CM

## 2022-02-23 NOTE — Therapy (Signed)
[place] ?[facility]PITAL[facility]GRAM ?[address] ?[place], [place], [address] ?Phone: [phone]   Fax:  [phone] ? ?Occupational Therapy Treatment ? ?Virtual Visit via Video Note ? ?I connected with [name] on 02/23/22 at  8:00 AM EDT by a video enabled telemedicine application and verified that I am speaking with the correct person using two identifiers. ? ?Location: ?Patient: home ?Provider: office ?  ?I discussed the limitations of evaluation and management by telemedicine and the availability of in person appointments. The patient expressed understanding and agreed to proceed. ? ?  ?The patient was advised to call back or seek an in-person evaluation if the symptoms worsen or if the condition fails to improve as anticipated. ? ?I provided 50 minutes of non-face-to-face time during this encounter. ? ? ?Patient Details  ?Name: Lindsey [name] ?MRN: [id number] ?Date of Birth: 1986-09-09 ?No data recorded ? ?Encounter Date: 02/23/2022 ? ? OT End of Session - 02/23/22 1758   ? ? Visit Number 3   ? Number of Visits 20   ? Date for OT Re-Evaluation 03/23/22   ? Authorization Type [employer] [facility]   ? Authorization Time Period working on benefits verification   ? OT Start Time 1200   ? OT Stop Time 1250   ? OT Time Calculation (min) 50 min   ? Equipment Utilized During Treatment power point / webx   ? Activity Tolerance Patient tolerated treatment well   ? Behavior During Therapy [facility] for tasks assessed/performed   ? ?  ?  ? ?  ? ? ?Past Medical History:  ?Diagnosis Date  ? Anxiety and depression   ? Depression 02/22/2015  ? Diabetes (H[facility])   ? Headache(784.0)   ? High cholesterol   ? Hypercholesteremia   ? Hyperlipidemia   ? Hypertension   ? Hypothyroidism   ? Obesity   ? Panic disorder   ? Polycystic ovary   ? Pseudotumor cerebri   ? PTSD (post-traumatic stress disorder)   ? ? ?Past Surgical History:  ?Procedure Laterality Date  ? LUMBAR PUNCTURE     ? ? ?There were no vitals filed for this visit. ? ? Subjective Assessment - 02/23/22 1757   ? ? Currently in Pain? No/denies   ? Pain Score 0-No pain   ? Multiple Pain Sites No   ? ?  ?  ? ?  ? ? ? ? ?Group Session: ? ?S: "I have trouble finding the motivation and energy just getting out of bed to do the basic things, like showering and brushing my teeth. So this group today will help me a lot I think" ? ?O: The patient reported struggling with basic self-care activities due to their major depression during the OT tele-health session. The occupational therapist provided education on the impact of depression on self-care and discussed ten specific strategies to improve self-care. The therapist explained how using a shower chair or bath bench could help the patient conserve energy during showering. The therapist also explained the benefits of establishing a bedtime routine, including taking a warm bath, reading a book, or practicing relaxation techniques to improve the patient's overall sleep hygiene. The OT explained how using a combination of proprioception and gravity can help the patient to move and overcome difficulties with getting out of bed in the morning. The therapist demonstrated how placing one leg off the bed while in supine can provide proprioception feedback and promote movement to further facilitate movement to the edge  of the bed and ultimately sitting up in bed. The therapist discussed breaking down tasks into smaller steps to make them more manageable and increase motivation, along with using positive self-talk to encourage the patient's ability to engage in self-care activities. The therapist recommended listening to calming or uplifting music and using scented products such as candles or essential oils to promote relaxation. The session ended w/ each pt reporting at least one single strategy they might use to improve the initiation of engaging in these basic [place] ADLs. OT will conclude this group  topic tomorrow on 02/24/2022.  ?  ?A: The patient demonstrated understanding of specific strategies discussed to improve self-care, including the use of proprioception and gravity, and expressed interest in implementing them. The occupational therapist will continue to assess progress in implementing these strategies and provide additional support as needed in future telehealth sessions. The therapist will also continue to address the impact of depression on self-care and explore any additional barriers that may be hindering the patient's ability to engage in self-care activities, including getting out of bed in the morning. ? ?P: Continue to attend PHP OT group sessions 5x week for 4 weeks to promote daily structure, social engagement, and opportunities to develop and utilize adaptive strategies to maximize functional performance in preparation for safe transition and integration back into school, work, and the community. Plan to address topic of Depression and [place] part 2 in next OT group session. ? ? ? ? ? ? ? ? ? ? ? ? ? ? ? ? ? ? ? OT Education - 02/23/22 1757   ? ? Education Details Depression and the impact on ADLs 1/2   ? Person(s) Educated Patient   ? Methods Explanation;Handout   ? Comprehension Verbalized understanding   ? ?  ?  ? ?  ? ? ? OT Short Term Goals - 02/21/22 2037   ? ?  ? OT SHORT TERM GOAL #1  ? Title Pt will be independnet w/ education and coping skills to use interventions learned in OT group in order to return to successful community living   ? Time 4   ? Period Weeks   ? Status New   ? Target Date 03/23/22   ?  ? OT SHORT TERM GOAL #2  ? Title Pt will demonstrate independence in goal setting skills in order to achieve independence in community re-entry   ?  ? OT SHORT TERM GOAL #3  ? Title pt will be independent w/ gradual exposure skills in order to independently and successfully engage in community re-entry.   ? ?  ?  ? ?  ? ? ? ? ? ? ? ? ? ? ? Plan - 02/23/22 1759   ? ? Psychosocial Skills  Coping Strategies;Habits;Interpersonal Interaction;Routines and Behaviors   ? ?  ?  ? ?  ? ? ?Patient will benefit from skilled therapeutic intervention in order to improve the following deficits and impairments:   ?  ?  ?Psychosocial Skills: Coping Strategies, Habits, Interpersonal Interaction, Routines and Behaviors ? ? ?Visit Diagnosis: ?Difficulty coping ? ? ? ?Problem List ?Patient Active Problem List  ? Diagnosis Date Noted  ? Major depressive disorder, recurrent episode, severe (HCC) 02/14/2022  ? Panic disorder with agoraphobia 02/14/2022  ? Type 2 diabetes mellitus with other specified complication ([facility]CC) 10/29/2021  ? Anxiety 10/29/2021  ? High cholesterol 10/29/2021  ? Hypertension 10/29/2021  ? Hypothyroidism 10/29/2021  ? Major depression in partial remission (H[facility]) 10/29/2021  ? Panic  disorder 10/29/2021  ? Morbid obesity (HCC) 10/29/2021  ? Idiopathic intracranial hypertension 06/02/2015  ? Depression 02/22/2015  ? Pseudotumor cerebri 09/09/2011  ? Obesity, morbid (H[facility]) 09/09/2011  ? Vision disturbance 09/09/2011  ? ? ?Ted Mcalpine, OT ?02/23/2022, 5:59 PM ? ?Kerrin Champagne, OT ? ? ?[place] ?[facility] ?[address] ?[place], [place], [address] ?Phone: [phone]   Fax:  [phone] ? ?Name: Devin Foskey ?MRN: [id number] ?Date of Birth: 03/21/1986 ? ?

## 2022-02-24 ENCOUNTER — Other Ambulatory Visit (HOSPITAL_COMMUNITY): Payer: 59

## 2022-02-24 ENCOUNTER — Encounter (HOSPITAL_COMMUNITY): Payer: Self-pay

## 2022-02-24 ENCOUNTER — Other Ambulatory Visit (HOSPITAL_COMMUNITY): Payer: 59 | Admitting: Licensed Clinical Social Worker

## 2022-02-24 DIAGNOSIS — F332 Major depressive disorder, recurrent severe without psychotic features: Secondary | ICD-10-CM | POA: Diagnosis not present

## 2022-02-24 DIAGNOSIS — F4001 Agoraphobia with panic disorder: Secondary | ICD-10-CM

## 2022-02-24 DIAGNOSIS — R4589 Other symptoms and signs involving emotional state: Secondary | ICD-10-CM

## 2022-02-24 NOTE — Therapy (Signed)
[facility] ?[facility] ?[address] ?[place], [place], [address] ?Phone: [phone]   Fax:  [phone] ? ?Occupational Therapy Treatment ?Virtual Visit via Video Note ? ?I connected with Lindsey [name] on 02/24/22 at  8:00 AM EDT by a video enabled telemedicine application and verified that I am speaking with the correct person using two identifiers. ? ?Location: ?Patient: home ?Provider: office ?  ?I discussed the limitations of evaluation and management by telemedicine and the availability of in person appointments. The patient expressed understanding and agreed to proceed. ? ?  ?The patient was advised to call back or seek an in-person evaluation if the symptoms worsen or if the condition fails to improve as anticipated. ? ?I provided 60 minutes of non-face-to-face time during this encounter. ? ? ?Patient Details  ?Name: Lindsey [name] ?MRN: [id number] ?Date of Birth: 12/05/1985 ?No data recorded ? ?Encounter Date: 02/24/2022 ? ? OT End of Session - 02/24/22 1742   ? ? Visit Number 4   ? Number of Visits 20   ? Date for OT Re-Evaluation 03/23/22   ? Authorization Type [employer] [facility]   ? Authorization Time Period working on benefits verification   ? OT Start Time 1100   ? OT Stop Time 1200   ? OT Time Calculation (min) 60 min   ? Equipment Utilized During Treatment power point / webx   ? ?  ?  ? ?  ? ? ?Past Medical History:  ?Diagnosis Date  ? Anxiety and depression   ? Depression 02/22/2015  ? Diabetes (HCC)   ? Headache(784.0)   ? High cholesterol   ? Hypercholesteremia   ? Hyperlipidemia   ? Hypertension   ? Hypothyroidism   ? Obesity   ? Panic disorder   ? Polycystic ovary   ? Pseudotumor cerebri   ? PTSD (post-traumatic stress disorder)   ? ? ?Past Surgical History:  ?Procedure Laterality Date  ? LUMBAR PUNCTURE    ? ? ?There were no vitals filed for this visit. ? ? Subjective Assessment - 02/24/22 1741   ? ? Currently in Pain?  No/denies   ? Pain Score 0-No pain   ? ?  ?  ? ?  ? ? ? ? ? ?Group Session: ? ?S: "I believe the Self Care Contract will help me better hold myself accountable for the things I need to do but aren't at the moment" ? ?O: In today's OT group therapy session, OT concluded the topic of Depression and Self Care w/ specific attention on the concept of using a self-care contract to hold themselves accountable for completing basic self-care tasks. The patient was able to identify specific tasks they need to do each day in order to take care of themselves, including taking a shower or bath, brushing their teeth twice a day, getting dressed in clean clothes, eating at least one nutritious meal per day, and taking their medication as prescribed. The patient also identified some of the activities they may be doing already that consume their attention and time unnecessarily and without any value. ? ?During the session, the patient discussed using these "time suck" activities, such as scrolling through social media, as one of the consequences for breaking their self-care contract. The patient demonstrated an understanding of the importance of holding themselves accountable for their own self-care and expressed a willingness to create a personalized self-care contract to help them stay on track. ? ?Overall, the patient showed progress in  identifying self-care tasks and understanding the value of using a self-care contract to hold themselves accountable for completing these tasks. ? ?A: The patient demonstrated a high level of engagement during the self-care contract discussion, actively participating in group activities and expressing a strong willingness to create and follow through with a personalized contract. The patient showed an understanding of the importance of completing basic self-care tasks and was able to identify several concrete actions they could take to improve their self-care routine. Overall, the patient's high  level of engagement and motivation are positive indicators of their potential success in using a self-care contract to hold themselves accountable. ? ?P: Continue to attend PHP OT group sessions 5x week for 4 weeks to promote daily structure, social engagement, and opportunities to develop and utilize adaptive strategies to maximize functional performance in preparation for safe transition and integration back into school, work, and the community. Plan to address topic of tbd in next OT group session. ? ? ? ? ? ? ? ? ? ? ? ? ? ? ? ? ? ? OT Education - 02/24/22 1741   ? ? Education Details Depression and the impact on ADLs w/ focus on Self Care Contracts   ? Person(s) Educated Patient   ? Methods Explanation;Handout   ? Comprehension Verbalized understanding   ? ?  ?  ? ?  ? ? ? OT Short Term Goals - 02/21/22 2037   ? ?  ? OT SHORT TERM GOAL #1  ? Title Pt will be independnet w/ education and coping skills to use interventions learned in OT group in order to return to successful community living   ? Time 4   ? Period Weeks   ? Status New   ? Target Date 03/23/22   ?  ? OT SHORT TERM GOAL #2  ? Title Pt will demonstrate independence in goal setting skills in order to achieve independence in community re-entry   ?  ? OT SHORT TERM GOAL #3  ? Title pt will be independent w/ gradual exposure skills in order to independently and successfully engage in community re-entry.   ? ?  ?  ? ?  ? ? ? ? ? ? ? ? ? ? ? Plan - 02/24/22 1742   ? ? Psychosocial Skills Coping Strategies;Habits;Interpersonal Interaction;Routines and Behaviors   ? ?  ?  ? ?  ? ? ?Patient will benefit from skilled therapeutic intervention in order to improve the following deficits and impairments:   ?  ?  ?Psychosocial Skills: Coping Strategies, Habits, Interpersonal Interaction, Routines and Behaviors ? ? ?Visit Diagnosis: ?Difficulty coping ? ? ? ?Problem List ?Patient Active Problem List  ? Diagnosis Date Noted  ? Major depressive disorder, recurrent  episode, severe (HCC) 02/14/2022  ? Panic disorder with agoraphobia 02/14/2022  ? Type 2 diabetes mellitus with other specified complication (HCC) 10/29/2021  ? Anxiety 10/29/2021  ? High cholesterol 10/29/2021  ? Hypertension 10/29/2021  ? Hypothyroidism 10/29/2021  ? Major depression in partial remission (HCC) 10/29/2021  ? Panic disorder 10/29/2021  ? Morbid obesity (HCC) 10/29/2021  ? Idiopathic intracranial hypertension 06/02/2015  ? Depression 02/22/2015  ? Pseudotumor cerebri 09/09/2011  ? Obesity, morbid (HCC) 09/09/2011  ? Vision disturbance 09/09/2011  ? ? ?Ted Mcalpine, OT ?02/24/2022, 5:43 PM ? ?Kerrin Champagne, OT ? ? ?[facility] ?[facility] ?[address] ?[place], [place], [address] ?Phone: [phone]   Fax:  [phone] ? ?Name: Lindsey [name] ?MRN:  [id number] ?Date of Birth: May 25, 1986 ? ?

## 2022-02-25 ENCOUNTER — Ambulatory Visit (HOSPITAL_COMMUNITY): Payer: 59

## 2022-02-25 ENCOUNTER — Telehealth (HOSPITAL_COMMUNITY): Payer: Self-pay | Admitting: Professional

## 2022-02-27 ENCOUNTER — Encounter (HOSPITAL_COMMUNITY): Payer: Self-pay | Admitting: Family
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
## 2022-02-28 ENCOUNTER — Encounter (HOSPITAL_COMMUNITY): Payer: Self-pay

## 2022-02-28 ENCOUNTER — Other Ambulatory Visit (HOSPITAL_COMMUNITY): Payer: 59 | Admitting: Professional

## 2022-02-28 ENCOUNTER — Other Ambulatory Visit (HOSPITAL_COMMUNITY): Payer: 59

## 2022-02-28 DIAGNOSIS — F332 Major depressive disorder, recurrent severe without psychotic features: Secondary | ICD-10-CM

## 2022-02-28 DIAGNOSIS — R4589 Other symptoms and signs involving emotional state: Secondary | ICD-10-CM

## 2022-02-28 DIAGNOSIS — F4001 Agoraphobia with panic disorder: Secondary | ICD-10-CM

## 2022-02-28 NOTE — Therapy (Signed)
[facility] ?[facility] ?[place], [place], [address] ?Phone: [phone]   Fax:  [phone] ? ?Occupational Therapy Treatment ?Virtual Visit via Video Note ? ?I connected with Cornelia Copa on 02/28/22 at  8:00 AM EDT by a video enabled telemedicine application and verified that I am speaking with the correct person using two identifiers. ? ?Location: ?Patient: home ?Provider: office ?  ?I discussed the limitations of evaluation and management by telemedicine and the availability of in person appointments. The patient expressed understanding and agreed to proceed. ? ?  ?The patient was advised to call back or seek an in-person evaluation if the symptoms worsen or if the condition fails to improve as anticipated. ? ?I provided 55 minutes of non-face-to-face time during this encounter. ? ? ?Patient Details  ?Name: Lindsey [name] ?MRN: [id number] ?Date of Birth: 15-Jan-1986 ?No data recorded ? ?Encounter Date: 02/28/2022 ? ? OT End of Session - 02/28/22 1804   ? ? Visit Number 5   ? Number of Visits 20   ? Date for OT Re-Evaluation 03/23/22   ? Authorization Type [employer] [facility]   ? Authorization Time Period [facility]  1/1-12/31/202 // Visit limit 90- 0 used Combined with PT/OT/ ST   No Jackolyn Confer [id number]   ? Authorization - Visit Number 90   ? OT Start Time 1200   ? OT Stop Time [id number]   ? OT Time Calculation (min) 55 min   ? ?  ?  ? ?  ? ? ?Past Medical History:  ?Diagnosis Date  ? Anxiety and depression   ? Depression 02/22/2015  ? Diabetes ([place])   ? Headache(784.0)   ? High cholesterol   ? Hypercholesteremia   ? Hyperlipidemia   ? Hypertension   ? Hypothyroidism   ? Obesity   ? Panic disorder   ? Polycystic ovary   ? Pseudotumor cerebri   ? PTSD (post-traumatic stress disorder)   ? ? ?Past Surgical History:  ?Procedure Laterality Date  ? LUMBAR PUNCTURE    ? ? ?There were no vitals filed for this visit. ? ? Subjective  Assessment - 02/28/22 1803   ? ? Currently in Pain? No/denies   ? Pain Score 0-No pain   ? Multiple Pain Sites No   ? ?  ?  ? ?  ? ? ? ? ?Group Session: ? ?S: "Feeling better today." ? ?O: During the group therapy session via telehealth, the patient participated in accounting the forgiveness / reflective writing activity OT assigned over the weekend. While the patient was not present for 5/5 OT session, pt did, nonetheless, actively engaged in the discussion and shared personal experiences related to the topic. ?  ?The session emphasized the relationship between forgiveness and communication and how engaging in forgiveness can improve communication and overall mental health and wellbeing. The patient demonstrated an understanding of the importance of forgiveness and expressed a willingness to practice forgiveness in their personal relationships.  ?  ?OT concluded the session by recapping the groups reflective writing activity from the weekend where she chose prompt #3  that explore various scenarios that explore times when they've experienced a situation where forgiveness was ether offered or received in the encounter and how they processed that interaction, how they can draw from those past experiences to improve their current strained relationships in an effort to, in turn, improve their own mental health and wellbeing resulting in improved overall occupational performance.  ? ?  A: Patient was fairly engaged and participative t/out the session. She actively contributed to the discussion and shared personal experiences related to the topic. She demonstrated an understanding of the relationship between communication and forgiveness and its impact on mental health and wellbeing. Pt expressed a willingness to practice forgiveness in their personal relationships and showed a positive attitude towards improving their communication skills. ? ?P: Continue to attend PHP OT group sessions 5x week for 4 weeks to promote daily  structure, social engagement, and opportunities to develop and utilize adaptive strategies to maximize functional performance in preparation for safe transition and integration back into school, work, and the community. Plan to address topic of tbd in next OT group session. ? ? ? ? ? ? ? ? ? ? ? ? ? ? ? ? ? ? ? OT Education - 02/28/22 1804   ? ? Education Community education officer and Forgiveness for Improved Relationships 2/2   ? Person(s) Educated Patient   ? Methods Explanation;Handout   ? Comprehension Verbalized understanding   ? ?  ?  ? ?  ? ? ? OT Short Term Goals - 02/21/22 2037   ? ?  ? OT SHORT TERM GOAL #1  ? Title Pt will be independnet w/ education and coping skills to use interventions learned in OT group in order to return to successful community living   ? Time 4   ? Period Weeks   ? Status New   ? Target Date 03/23/22   ?  ? OT SHORT TERM GOAL #2  ? Title Pt will demonstrate independence in goal setting skills in order to achieve independence in community re-entry   ?  ? OT SHORT TERM GOAL #3  ? Title pt will be independent w/ gradual exposure skills in order to independently and successfully engage in community re-entry.   ? ?  ?  ? ?  ? ? ? ? ? ? ? ? ? ? ? Plan - 02/28/22 1806   ? ? Psychosocial Skills Coping Strategies;Habits;Interpersonal Interaction;Routines and Behaviors   ? ?  ?  ? ?  ? ? ?Patient will benefit from skilled therapeutic intervention in order to improve the following deficits and impairments:   ?  ?  ?Psychosocial Skills: Coping Strategies, Habits, Interpersonal Interaction, Routines and Behaviors ? ? ?Visit Diagnosis: ?Difficulty coping ? ? ? ?Problem List ?Patient Active Problem List  ? Diagnosis Date Noted  ? Major depressive disorder, recurrent episode, severe ([place]) 02/14/2022  ? Panic disorder with agoraphobia 02/14/2022  ? Type 2 diabetes mellitus with other specified complication ([place]) 123456  ? Anxiety 10/29/2021  ? High cholesterol 10/29/2021  ? Hypertension  10/29/2021  ? Hypothyroidism 10/29/2021  ? Major depression in partial remission ([place]) 10/29/2021  ? Panic disorder 10/29/2021  ? Morbid obesity ([place]) 10/29/2021  ? Idiopathic intracranial hypertension 06/02/2015  ? Depression 02/22/2015  ? Pseudotumor cerebri 09/09/2011  ? Obesity, morbid ([place]) 09/09/2011  ? Vision disturbance 09/09/2011  ? ? ?Brantley Stage, OT ?02/28/2022, 6:06 PM ? ?Cornell Barman, OT ? ? ?[facility] ?[facility]GRAM ?[place], [place], [address] ?Phone: [phone]   Fax:  [phone] ? ?Name: Henretter Chakrabarti ?MRN: [id number] ?Date of Birth: 19-Oct-1986 ? ?

## 2022-03-01 ENCOUNTER — Other Ambulatory Visit (HOSPITAL_COMMUNITY): Payer: 59

## 2022-03-01 ENCOUNTER — Other Ambulatory Visit (HOSPITAL_COMMUNITY): Payer: 59 | Admitting: Licensed Clinical Social Worker

## 2022-03-01 ENCOUNTER — Encounter (HOSPITAL_COMMUNITY): Payer: Self-pay

## 2022-03-01 DIAGNOSIS — R4589 Other symptoms and signs involving emotional state: Secondary | ICD-10-CM

## 2022-03-01 DIAGNOSIS — F4001 Agoraphobia with panic disorder: Secondary | ICD-10-CM

## 2022-03-01 DIAGNOSIS — F332 Major depressive disorder, recurrent severe without psychotic features: Secondary | ICD-10-CM | POA: Diagnosis not present

## 2022-03-01 NOTE — Psych (Signed)
Virtual Visit via Video Note ? ?I connected with Lindsey [name] on 02/28/22 at  9:00 AM EDT by a video enabled telemedicine application and verified that I am speaking with the correct person using two identifiers. ? ?Location: ?Patient: Home ?Provider: Clinical Home Office ?  ?I discussed the limitations of evaluation and management by telemedicine and the availability of in person appointments. The patient expressed understanding and agreed to proceed. ? ?Follow Up Instructions: ? ?  ?I discussed the assessment and treatment plan with the patient. The patient was provided an opportunity to ask questions and all were answered. The patient agreed with the plan and demonstrated an understanding of the instructions. ?  ?The patient was advised to call back or seek an in-person evaluation if the symptoms worsen or if the condition fails to improve as anticipated. ? ?I provided 240 minutes of non-face-to-face time during this encounter. ? ? ?Lindsey [name], [facility] ? ? ?[facility] PHP THERAPIST PROGRESS NOTE ? ?[name] ?[id number] ? ?Session Time: 9-10 ? ?Participation Level: Active ? ?Behavioral Response: CasualAlertAnxious and Depressed ? ?Type of Therapy: Group Therapy ? ?Treatment Goals addressed: Coping ? ?Progress Towards Goals: Progressing ? ?Interventions: CBT, DBT, Solution Focused, Strength-based, Supportive, and Reframing ? ?Summary: Clinician led check-in regarding current stressors and situation, and review of patient completed daily inventory. Clinician utilized active listening and empathetic response and validated patient emotions. Clinician facilitated processing group on pertinent issues.  ? ?Therapist Response: Lindsey [name] is a 36 y.o. female who presents with depression, anxiety, and agoraphobia symptoms. Patient arrived within time allowed and reports that she is feeling "OK" Patient rates her mood at a 2 on a scale of 1-10 with 10 being great. Pt reports she did not have her girlfriend over for  the weekend as planned due to pt's high anxiety related to financial issues. Patient able to process. Patient engaged in discussion. ?  ?  ? ?Session Time: 10:00- 11:00 ?  ?Participation Level: None ?  ?Behavioral Response: CasualAlertDepressed ?  ?Type of Therapy: Group Therapy ?  ?Treatment Goals addressed: Coping ? ?Progress Towards Goals: Progressing ?  ?Interventions: CBT, DBT, Supportive, Reframing, and Strengths Based ?  ?Summary: Clinician led the group in discussing the idea of being a ?perfect person/parent?, releasing control helping with mental health treatment, and finding balance in life. ?Therapist Response: Patient did not engage in conversation. ? ?Session Time: 11:00 -12:00 ?  ?Participation Level: Active ?  ?Behavioral Response: CasualAlertDepressed ?  ?Type of Therapy: Group therapy ?  ?Treatment Goals addressed: Coping ? ?Progress Towards Goals: Progressing ?  ?Interventions: CBT; Solution focused; Supportive; Reframing ?  ?Summary: Clinician introduced "Mindfulness". Group discussed "What" and "How" skills of mindfulness. Patients identified what types of activities would help them practice mindfulness.  ? ?  ?Therapist Response: Group discussed practicing mindfulness during "autopilot" activities such as driving, showering, eating, or doing chores. Group identified timed-practice skills for mindfulness such as doing puzzles, coloring, meditation, PMR, and 5-4-3-2-1. ? ? ?  ?Session Time: 12:00 -1:00 ?  ?Participation Level: Active ?  ?Behavioral Response: CasualAlertDepressed ?  ?Type of Therapy: Group Therapy, Occupational Therapy ?  ?Treatment Goals addressed: Coping ? ?Progress Towards Goals: Progressing ?  ?Interventions: Supportive, Education ?  ?Summary:  12:00-12:50: Occupational Therapist, Beverely Risen, lead group. ?12:50 -1:00 Clinician led check-out. Clinician assessed for immediate needs, medication compliance and efficacy, and safety concerns ? ?  ?Therapist Response: 12:00-12:50:  See OT note. ?12:50 - 1:00: At check-out, patient rates her mood  at a 1 on a scale of 1-10 with 10 being great. Pt reports she is "antsy" due to continued anxiety related to financial issues. Patient demonstrates limited progress by continuing to use sleep as only coping skill. Patient denies SI/HI/self-harm at the end of group. ? ? ?Suicidal/Homicidal: Nowithout intent/plan ? ?Plan: Pt will continue in PHP and medication management while continuing to work on decreasing depression symptoms, SI, and increasing the ability to self manage symptoms.  ? ?Collaboration of Care: Other none ? ?Patient/Guardian was advised Release of Information must be obtained prior to any record release in order to collaborate their care with an outside provider. Patient/Guardian was advised if they have not already done so to contact the registration department to sign all necessary forms in order for [name] to release information regarding their care.  ? ?Consent: Patient/Guardian gives verbal consent for treatment and assignment of benefits for services provided during this visit. Patient/Guardian expressed understanding and agreed to proceed.  ? ?Diagnosis: Severe episode of recurrent major depressive disorder, without psychotic features (HCC) [F33.2] ?   ?1. Severe episode of recurrent major depressive disorder, without psychotic features (HCC)   ?2. Panic disorder with agoraphobia   ? ? ? ? ?Lindsey [name], [facility] ?02/28/22 ? ?

## 2022-03-01 NOTE — Progress Notes (Signed)
Spoke with patient via Webex video call, used 2 identifiers to correctly identify patient. States that she is still not comfortable leaving her home. She was able to go to her mailbox and check the mail that had not been checked for 2 months. She was happy about that small step. She is very anxious today as she is waiting to hear if her disability will be approved. She got a letter of eviction if she does not pay her rent soon and she has no income to pay the rent. She is enjoying groups even though some discussions do not pertain to her situation such as being a parent. Denies SI/HI or AV hallucinations. On scale 1-10 as 10 being worst she rates depression at 6 and anxiety at 10. No side effects from medications. No issues or complaints.

## 2022-03-01 NOTE — Therapy (Signed)
[place] ?BE[facility]AVIORAL [facility] PARTIAL HOSPITAL[facility] PROGRAM ?[place], [place], [address] ?Phone: [phone]   Fax:  [phone] ? ?Occupational Therapy Treatment ? ?Virtual Visit via Video Note ? ?I connected with Cornelia Copa on 03/01/22 at  8:00 AM EDT by a video enabled telemedicine application and verified that I am speaking with the correct person using two identifiers. ? ?Location: ?Patient: home ?Provider: office ?  ?I discussed the limitations of evaluation and management by telemedicine and the availability of in person appointments. The patient expressed understanding and agreed to proceed. ? ?  ?The patient was advised to call back or seek an in-person evaluation if the symptoms worsen or if the condition fails to improve as anticipated. ? ?I provided 55 minutes of non-face-to-face time during this encounter. ? ? ?Patient Details  ?Name: Lindsey [name] ?MRN: [id number] ?Date of Birth: 1986-03-16 ?No data recorded ? ?Encounter Date: 03/01/2022 ? ? OT End of Session - 03/01/22 1348   ? ? Visit Number 6   ? Number of Visits 20   ? Date for OT Re-Evaluation 03/23/22   ? Authorization Type [employer]   ? Authorization Time Period [facility] Select  1/1-12/31/202 // Visit limit 90- 0 used Combined with PT/OT/ ST   No Jackolyn Confer [id number]   ? Authorization - Visit Number 90   ? OT Start Time 1200   ? OT Stop Time [id number]   ? OT Time Calculation (min) 55 min   ? Equipment Utilized During Treatment power point / webx   ? Activity Tolerance Patient tolerated treatment well   ? Behavior During Therapy [facility] for tasks assessed/performed   ? ?  ?  ? ?  ? ? ?Past Medical History:  ?Diagnosis Date  ? Anxiety and depression   ? Depression 02/22/2015  ? Diabetes ([place])   ? Headache(784.0)   ? High cholesterol   ? Hypercholesteremia   ? Hyperlipidemia   ? Hypertension   ? Hypothyroidism   ? Obesity   ? Panic disorder   ? Polycystic ovary   ? Pseudotumor cerebri   ? PTSD  (post-traumatic stress disorder)   ? ? ?Past Surgical History:  ?Procedure Laterality Date  ? LUMBAR PUNCTURE    ? ? ?There were no vitals filed for this visit. ? ? Subjective Assessment - 03/01/22 1347   ? ? Pain Score 0-No pain   ? Multiple Pain Sites No   ? ?  ?  ? ?  ? ? ? ? ?Group Session: ? ?S: "I've found it difficult to trust so I am having a difficult time sharing an example of reciprocity in my past" ? ?O:  The therapy session was led by an occupational therapist via telehealth with five patients in attendance. The focus of the session was on exploring the power of reciprocity and fairness to improve relationships and overall mental health and wellbeing. At the beginning of the session, the OT provided an introduction to the topic and defined the session's objectives. The participants then engaged in a discussion about their past experiences with the concept of reciprocity and how these experiences have influenced their lives and relationships, both positively and negatively. Throughout the session, the OT emphasized the importance of reciprocal relationships and how this concept can serve as a guide for future engagements with relationships and relationship development. ? ?A: The patient attended the therapy session on the power of reciprocity and fairness to improve relationships and overall  mental health and wellbeing, but her participation was minimal in the discussion. She appeared attentive however she and did not share any personal experiences related to the topic. Despite the efforts of the occupational therapist to engage them in the discussion, the patient remained passive and showed minimal connection in the session's objectives, it is important to state she has stated her trust issues prevent her from fully engaging. It is unclear if the patient fully understood the importance of reciprocal relationships and how it can impact their mental health and wellbeing. OT will conclude part 2 of this  topic on 03/02/2022.  ? ?P: Continue to attend PHP OT group sessions 5x week for 4 weeks to promote daily structure, social engagement, and opportunities to develop and utilize adaptive strategies to maximize functional performance in preparation for safe transition and integration back into school, work, and the community. Plan to address topic of The Power of Reciprocity 2/2 in next OT group session. ? ? ? ? ? ? ? ? ? ? ? ? ? ? ? ? ? ? ? OT Education - 03/01/22 1347   ? ? Education Details The Power of Reciprocity 1/2   ? Person(s) Educated Patient   ? ?  ?  ? ?  ? ? ? OT Short Term Goals - 02/21/22 2037   ? ?  ? OT SHORT TERM GOAL #1  ? Title Pt will be independnet w/ education and coping skills to use interventions learned in OT group in order to return to successful community living   ? Time 4   ? Period Weeks   ? Status New   ? Target Date 03/23/22   ?  ? OT SHORT TERM GOAL #2  ? Title Pt will demonstrate independence in goal setting skills in order to achieve independence in community re-entry   ?  ? OT SHORT TERM GOAL #3  ? Title pt will be independent w/ gradual exposure skills in order to independently and successfully engage in community re-entry.   ? ?  ?  ? ?  ? ? ? ? ? ? ? ? ? ? ? Plan - 03/01/22 1348   ? ? Psychosocial Skills Coping Strategies;Habits;Interpersonal Interaction;Routines and Behaviors   ? ?  ?  ? ?  ? ? ?Patient will benefit from skilled therapeutic intervention in order to improve the following deficits and impairments:   ?  ?  ?Psychosocial Skills: Coping Strategies, Habits, Interpersonal Interaction, Routines and Behaviors ? ? ?Visit Diagnosis: ?Difficulty coping ? ? ? ?Problem List ?Patient Active Problem List  ? Diagnosis Date Noted  ? Major depressive disorder, recurrent episode, severe ([place]) 02/14/2022  ? Panic disorder with agoraphobia 02/14/2022  ? Type 2 diabetes mellitus with other specified complication ([place]) [id number]  ? Anxiety 10/29/2021  ? High cholesterol 10/29/2021  ?  Hypertension 10/29/2021  ? Hypothyroidism 10/29/2021  ? Major depression in partial remission ([place]) 10/29/2021  ? Panic disorder 10/29/2021  ? Morbid obesity ([place]) 10/29/2021  ? Idiopathic intracranial hypertension 06/02/2015  ? Depression 02/22/2015  ? Pseudotumor cerebri 09/09/2011  ? Obesity, morbid ([place]) 09/09/2011  ? Vision disturbance 09/09/2011  ? ? ?Brantley Stage, OT ?03/01/2022, 1:49 PM ?Cornell Barman, OT ? ? ?[place] ?BEHAVIORAL HE[facility] PARTIAL HOSPITALIZATION PROGRAM ?[place], [place], [address] ?Phone: [phone]   Fax:  [phone] ? ?Name: Mirjana Brunell ?MRN: [id number] ?Date of Birth: 10/14/1986 ? ?

## 2022-03-02 ENCOUNTER — Encounter (HOSPITAL_COMMUNITY): Payer: Self-pay

## 2022-03-02 ENCOUNTER — Other Ambulatory Visit (HOSPITAL_COMMUNITY): Payer: 59 | Admitting: Licensed Clinical Social Worker

## 2022-03-02 ENCOUNTER — Other Ambulatory Visit (HOSPITAL_COMMUNITY): Payer: 59

## 2022-03-02 DIAGNOSIS — F4001 Agoraphobia with panic disorder: Secondary | ICD-10-CM

## 2022-03-02 DIAGNOSIS — R4589 Other symptoms and signs involving emotional state: Secondary | ICD-10-CM

## 2022-03-02 DIAGNOSIS — F332 Major depressive disorder, recurrent severe without psychotic features: Secondary | ICD-10-CM

## 2022-03-02 NOTE — Therapy (Signed)
[place] ?BEHAVIORAL HEALTH PARTIAL HOSPITALIZATION PROGRAM ?[address] ?[place], [place], [address] ?Phone: [phone]   Fax:  [phone] ? ?Occupational Therapy Treatment ?Virtual Visit via Video Note ? ?I connected with Lindsey [name] on 03/02/22 at  8:00 AM EDT by a video enabled telemedicine application and verified that I am speaking with the correct person using two identifiers. ? ?Location: ?Patient: home ?Provider: office ?  ?I discussed the limitations of evaluation and management by telemedicine and the availability of in person appointments. The patient expressed understanding and agreed to proceed. ? ?  ?The patient was advised to call back or seek an in-person evaluation if the symptoms worsen or if the condition fails to improve as anticipated. ? ?I provided 55 minutes of non-face-to-face time during this encounter. ? ? ?Patient Details  ?Name: Lindsey [name] ?MRN: [id number] ?Date of Birth: 06-12-1986 ?No data recorded ? ?Encounter Date: 03/02/2022 ? ? OT End of Session - 03/02/22 1330   ? ? Visit Number 7   ? Number of Visits 20   ? Date for OT Re-Evaluation 03/23/22   ? Authorization Type [employer]   ? Authorization Time Period [facility] Select  1/1-12/31/202 // Visit limit 90- 0 used Combined with PT/OT/ ST   No Kaleen Odea ITG#[id number]   ? Authorization - Visit Number 90   ? OT Start Time 1200   ? OT Stop Time 1255   ? OT Time Calculation (min) 55 min   ? Equipment Utilized During Treatment power point / webx   ? Activity Tolerance Patient tolerated treatment well   ? Behavior During Therapy [facility] for tasks assessed/performed   ? ?  ?  ? ?  ? ? ?Past Medical History:  ?Diagnosis Date  ? Anxiety and depression   ? Depression 02/22/2015  ? Diabetes (HCC)   ? Headache(784.0)   ? High cholesterol   ? Hypercholesteremia   ? Hyperlipidemia   ? Hypertension   ? Hypothyroidism   ? Obesity   ? Panic disorder   ? Polycystic ovary   ? Pseudotumor cerebri   ? PTSD  (post-traumatic stress disorder)   ? ? ?Past Surgical History:  ?Procedure Laterality Date  ? LUMBAR PUNCTURE    ? ? ?There were no vitals filed for this visit. ? ? Subjective Assessment - 03/02/22 1330   ? ? Currently in Pain? No/denies   ? Pain Score 0-No pain   ? ?  ?  ? ?  ? ? ? ? ? ?Group Session: ? ?S: "I'm feeling a little better today, I guess" ? ?O:  The focus of the OT group session was the continuation of exploring the power of reciprocity and fairness to improve relationships and overall mental health and wellbeing. At the beginning of the session, the OT provided an introduction to the topic and defined the session's objectives. The participants then engaged in a discussion about their past experiences with the concept of reciprocity and how these experiences have influenced their lives and relationships, both positively and negatively. Throughout the session, the OT emphasized the importance of reciprocal relationships and how this concept can serve as a guide for future engagements with relationships and relationship development. ? ?A:  The patient attended part two of the OT therapy session on the power of reciprocity and fairness to improve relationships and overall mental health and wellbeing, but she did not actively participate in the discussion. She appeared attentive but was disengaged and  did not share any personal experiences related to the topic. Despite the efforts of the occupational therapist to engage her in the discussion, the patient remained passive t/out the session's objectives. It is unclear if the patient fully understood the importance of reciprocal relationships and how it can impact their mental health and wellbeing.  ? ?P: Continue to attend PHP OT group sessions 5x week for 4 weeks to promote daily structure, social engagement, and opportunities to develop and utilize adaptive strategies to maximize functional performance in preparation for safe transition and integration  back into school, work, and the community. Plan to address topic of tbd in next OT group session. ? ? ? ? ? ? ? ? ? ? ? ? ? ? ? ? ? ? OT Education - 03/02/22 1330   ? ? Education Details The Power of Reciprocity 2/2   ? Person(s) Educated Patient   ? Methods Explanation;Handout   ? Comprehension Verbalized understanding   ? ?  ?  ? ?  ? ? ? OT Short Term Goals - 02/21/22 2037   ? ?  ? OT SHORT TERM GOAL #1  ? Title Pt will be independnet w/ education and coping skills to use interventions learned in OT group in order to return to successful community living   ? Time 4   ? Period Weeks   ? Status New   ? Target Date 03/23/22   ?  ? OT SHORT TERM GOAL #2  ? Title Pt will demonstrate independence in goal setting skills in order to achieve independence in community re-entry   ?  ? OT SHORT TERM GOAL #3  ? Title pt will be independent w/ gradual exposure skills in order to independently and successfully engage in community re-entry.   ? ?  ?  ? ?  ? ? ? ? ? ? ? ? ? ? ? Plan - 03/02/22 1331   ? ? Psychosocial Skills Coping Strategies;Habits;Interpersonal Interaction;Routines and Behaviors   ? ?  ?  ? ?  ? ? ?Patient will benefit from skilled therapeutic intervention in order to improve the following deficits and impairments:   ?  ?  ?Psychosocial Skills: Coping Strategies, Habits, Interpersonal Interaction, Routines and Behaviors ? ? ?Visit Diagnosis: ?Difficulty coping ? ? ? ?Problem List ?Patient Active Problem List  ? Diagnosis Date Noted  ? Major depressive disorder, recurrent episode, severe (HCC) 02/14/2022  ? Panic disorder with agoraphobia 02/14/2022  ? Type 2 diabetes mellitus with other specified complication (HCC) 10/29/2021  ? Anxiety 10/29/2021  ? High cholesterol 10/29/2021  ? Hypertension 10/29/2021  ? Hypothyroidism 10/29/2021  ? Major depression in partial remission (HCC) 10/29/2021  ? Panic disorder 10/29/2021  ? Morbid obesity (HCC) 10/29/2021  ? Idiopathic intracranial hypertension 06/02/2015  ?  Depression 02/22/2015  ? Pseudotumor cerebri 09/09/2011  ? Obesity, morbid (HCC) 09/09/2011  ? Vision disturbance 09/09/2011  ? ? ?Ted Mcalpine, OT ?03/02/2022, 1:32 PM ? ?Kerrin Champagne, OT ? ? ?[place] ?BEHAVIORAL HE[facility] PARTIAL HOSPITAL[facility]ATION PROGRAM ?[address] ?[place], [place], [address] ?Phone: [phone]   Fax:  [phone] ? ?Name: Lindsey [name] ?MRN: [id number] ?Date of Birth: November 22, 1985 ? ?

## 2022-03-03 ENCOUNTER — Other Ambulatory Visit (HOSPITAL_COMMUNITY): Payer: 59

## 2022-03-03 ENCOUNTER — Other Ambulatory Visit (HOSPITAL_COMMUNITY): Payer: 59 | Admitting: Licensed Clinical Social Worker

## 2022-03-03 ENCOUNTER — Encounter (HOSPITAL_COMMUNITY): Payer: Self-pay

## 2022-03-03 DIAGNOSIS — F4001 Agoraphobia with panic disorder: Secondary | ICD-10-CM

## 2022-03-03 DIAGNOSIS — F332 Major depressive disorder, recurrent severe without psychotic features: Secondary | ICD-10-CM

## 2022-03-03 DIAGNOSIS — R4589 Other symptoms and signs involving emotional state: Secondary | ICD-10-CM

## 2022-03-03 NOTE — Therapy (Signed)
[facility] ?[facility] ?[place], [place], [address] ?Phone: [phone]   Fax:  [phone] ? ?Occupational Therapy Treatment ? ?Virtual Visit via Video Note ? ?I connected with Lindsey [name] on 03/03/22 at  8:00 AM EDT by a video enabled telemedicine application and verified that I am speaking with the correct person using two identifiers. ? ?Location: ?Patient: home ?Provider: office ?  ?I discussed the limitations of evaluation and management by telemedicine and the availability of in person appointments. The patient expressed understanding and agreed to proceed. ? ?  ?The patient was advised to call back or seek an in-person evaluation if the symptoms worsen or if the condition fails to improve as anticipated. ? ?I provided 55 minutes of non-face-to-face time during this encounter. ? ? ?Patient Details  ?Name: Lindsey [name] ?MRN: [id number] ?Date of Birth: 12-27-1985 ?No data recorded ? ?Encounter Date: 03/03/2022 ? ? OT End of Session - 03/03/22 1843   ? ? Visit Number 8   ? Number of Visits 20   ? Date for OT Re-Evaluation 03/23/22   ? Authorization Type [employer]   ? Authorization Time Period [facility] Select  1/1-12/31/202 // Visit limit 90- 0 used Combined with PT/OT/ ST   No Jackolyn Confer [id number]   ? Authorization - Visit Number 90   ? OT Start Time 1200   ? OT Stop Time [id number]   ? OT Time Calculation (min) 55 min   ? Equipment Utilized During Treatment power point / webx   ? Activity Tolerance Patient tolerated treatment well   ? Behavior During Therapy [facility] for tasks assessed/performed   ? ?  ?  ? ?  ? ? ?Past Medical History:  ?Diagnosis Date  ? Anxiety and depression   ? Depression 02/22/2015  ? Diabetes ([place])   ? Headache(784.0)   ? High cholesterol   ? Hypercholesteremia   ? Hyperlipidemia   ? Hypertension   ? Hypothyroidism   ? Obesity   ? Panic disorder   ? Polycystic ovary   ? Pseudotumor cerebri   ? PTSD  (post-traumatic stress disorder)   ? ? ?Past Surgical History:  ?Procedure Laterality Date  ? LUMBAR PUNCTURE    ? ? ?There were no vitals filed for this visit. ? ? Subjective Assessment - 03/03/22 1842   ? ? Pain Score 0-No pain   ? Multiple Pain Sites No   ? ?  ?  ? ?  ? ? ? ?Group Session: ? ?S: "Feeling better today" "I have had difficulties in the past setting boundaries and sticking with them, I do notice that when I do stay strong with my boundaries, I feel like I have more control." ? ?O: Today's OT group session focuses on the importance of setting and maintaining healthy boundaries for individuals who suffer from depression, anxiety, grief, and loss. OT highlighted and explored the definition of boundaries and provides examples of healthy boundaries, including knowing one's limits, saying no when necessary, and communicating needs clearly. OT emphasized the benefits of healthy boundary-setting, including improved self-esteem, increased control, and enhanced overall functioning. Common difficulties in setting and adhering to healthy boundaries are also discussed, such as fear of rejection, guilt, and conflict. The group also explores the impact of boundaries on one's ADLs from an, including time management, sleep hygiene, self-care activities, and social participation. The session concludes with an assignment for the patients to practice boundary-setting skills, seek support  from others, and prioritize their well-being to enhance their recovery and overall functioning. ? ?A: The patient demonstrates active participation and interest in understanding the importance of boundaries for their mental health. She actively contributed to discussions, asked questions, and provided personal and specific examples related to boundary-setting. The patient showed a willingness to explore and practice boundary-setting skills and expresses a desire to improve their overall well-being. She demonstrated an understanding of  the impact of boundaries on their activities of daily living and expressed motivation to incorporate healthy boundaries into their daily life. The patient's active engagement and receptiveness indicate a potential for successful implementation of boundary-setting strategies. OT will conclude this topic in part 2 in tomorrow's OT group session. ? ?P: Continue to attend PHP OT group sessions 5x week for 4 weeks to promote daily structure, social engagement, and opportunities to develop and utilize adaptive strategies to maximize functional performance in preparation for safe transition and integration back into school, work, and the community. Plan to address topic of Boundaries for Improved Activities of Daily Living, Part 2 in next OT group session. ? ? ? ? ? ? ? ? ? ? ? ? ? ? ? ? ? ? ? ? OT Education - 03/03/22 1843   ? ? Education Details Boundaries for Improved Activities of Daily Living   ? ?  ?  ? ?  ? ? ? OT Short Term Goals - 02/21/22 2037   ? ?  ? OT SHORT TERM GOAL #1  ? Title Pt will be independnet w/ education and coping skills to use interventions learned in OT group in order to return to successful community living   ? Time 4   ? Period Weeks   ? Status New   ? Target Date 03/23/22   ?  ? OT SHORT TERM GOAL #2  ? Title Pt will demonstrate independence in goal setting skills in order to achieve independence in community re-entry   ?  ? OT SHORT TERM GOAL #3  ? Title pt will be independent w/ gradual exposure skills in order to independently and successfully engage in community re-entry.   ? ?  ?  ? ?  ? ? ? ? ? ? ? ? ? ? ? Plan - 03/03/22 1844   ? ? Psychosocial Skills Coping Strategies;Habits;Interpersonal Interaction;Routines and Behaviors   ? ?  ?  ? ?  ? ? ?Patient will benefit from skilled therapeutic intervention in order to improve the following deficits and impairments:   ?  ?  ?Psychosocial Skills: Coping Strategies, Habits, Interpersonal Interaction, Routines and Behaviors ? ? ?Visit  Diagnosis: ?Difficulty coping ? ? ? ?Problem List ?Patient Active Problem List  ? Diagnosis Date Noted  ? Major depressive disorder, recurrent episode, severe ([place]) 02/14/2022  ? Panic disorder with agoraphobia 02/14/2022  ? Type 2 diabetes mellitus with other specified complication ([place]) 123456  ? Anxiety 10/29/2021  ? High cholesterol 10/29/2021  ? Hypertension 10/29/2021  ? Hypothyroidism 10/29/2021  ? Major depression in partial remission ([place]) 10/29/2021  ? Panic disorder 10/29/2021  ? Morbid obesity ([place]) 10/29/2021  ? Idiopathic intracranial hypertension 06/02/2015  ? Depression 02/22/2015  ? Pseudotumor cerebri 09/09/2011  ? Obesity, morbid ([place]) 09/09/2011  ? Vision disturbance 09/09/2011  ? ? ?Brantley Stage, OT ?03/03/2022, 6:44 PM ? ?Cornell Barman, OT ? ? ?[facility] ?[facility] ?[place], [place], [address] ?Phone: [phone]   Fax:  [phone] ? ?Name: Lindsey  [name] ?MRN: [id number] ?Date of Birth: [birth year] ? ?

## 2022-03-04 ENCOUNTER — Other Ambulatory Visit (HOSPITAL_COMMUNITY): Payer: 59 | Admitting: Licensed Clinical Social Worker

## 2022-03-04 ENCOUNTER — Other Ambulatory Visit (HOSPITAL_COMMUNITY): Payer: 59

## 2022-03-04 ENCOUNTER — Encounter (HOSPITAL_COMMUNITY): Payer: Self-pay

## 2022-03-04 DIAGNOSIS — F332 Major depressive disorder, recurrent severe without psychotic features: Secondary | ICD-10-CM | POA: Diagnosis not present

## 2022-03-04 DIAGNOSIS — R4589 Other symptoms and signs involving emotional state: Secondary | ICD-10-CM

## 2022-03-04 DIAGNOSIS — F4001 Agoraphobia with panic disorder: Secondary | ICD-10-CM

## 2022-03-04 NOTE — Therapy (Signed)
[facility] ?[facility] ?[address] ?[place], [place], [address] ?Phone: [phone]   Fax:  [phone] ? ?Occupational Therapy Treatment ?Virtual Visit via Video Note ? ?I connected with Lindsey [name] on 03/04/22 at  8:00 AM EDT by a video enabled telemedicine application and verified that I am speaking with the correct person using two identifiers. ? ?Location: ?Patient: home ?Provider: office ?  ?I discussed the limitations of evaluation and management by telemedicine and the availability of in person appointments. The patient expressed understanding and agreed to proceed. ? ?  ?The patient was advised to call back or seek an in-person evaluation if the symptoms worsen or if the condition fails to improve as anticipated. ? ?I provided 55 minutes of non-face-to-face time during this encounter. ? ? ?Patient Details  ?Name: Lindsey [name] ?MRN: [id number] ?Date of Birth: Nov 06, 1985 ?No data recorded ? ?Encounter Date: 03/04/2022 ? ? OT End of Session - 03/04/22 2045   ? ? Visit Number 9   ? Number of Visits 20   ? Date for OT Re-Evaluation 03/23/22   ? Authorization Type [employer] [facility]   ? Authorization Time Period [facility] Select  1/1-12/31/202 // Visit limit 90- 0 used Combined with PT/OT/ ST   No Kaleen Odea UKG#[id number]   ? Authorization - Visit Number 90   ? OT Start Time 1200   ? OT Stop Time 1255   ? OT Time Calculation (min) 55 min   ? Equipment Utilized During Treatment power point / webx   ? Activity Tolerance Patient tolerated treatment well   ? Behavior During Therapy [facility] for tasks assessed/performed   ? ?  ?  ? ?  ? ? ?Past Medical History:  ?Diagnosis Date  ? Anxiety and depression   ? Depression 02/22/2015  ? Diabetes ([facility])   ? Headache(784.0)   ? High cholesterol   ? Hypercholesteremia   ? Hyperlipidemia   ? Hypertension   ? Hypothyroidism   ? Obesity   ? Panic disorder   ? Polycystic ovary   ? Pseudotumor cerebri   ? PTSD  (post-traumatic stress disorder)   ? ? ?Past Surgical History:  ?Procedure Laterality Date  ? LUMBAR PUNCTURE    ? ? ?There were no vitals filed for this visit. ? ? Subjective Assessment - 03/04/22 2044   ? ? Currently in Pain? No/denies   ? Pain Score 0-No pain   ? ?  ?  ? ?  ? ? ? ? ?Group Session: ? ?S: "Feeling a little better today. I've been trying to slowly reintroduce boundaries into some aspects of my life." ? ?O: Today's OT group session focuses on the importance of setting and maintaining healthy boundaries for individuals who suffer from depression, anxiety, grief, and loss. OT highlighted and explored the definition of boundaries and provides examples of healthy boundaries, including knowing one's limits, saying no when necessary, and communicating needs clearly. OT emphasized the benefits of healthy boundary-setting, including improved self-esteem, increased control, and enhanced overall functioning. Common difficulties in setting and adhering to healthy boundaries are also discussed, such as fear of rejection, guilt, and conflict. The group also explores the impact of boundaries on one's ADLs from an, including time management, sleep hygiene, self-care activities, and social participation. The session concludes with an assignment for the patients to practice boundary-setting skills, seek support from others, and prioritize their well-being to enhance their recovery and overall functioning. ? ?A: The patient  demonstrates fairly active participation and interest regarding understanding the importance of boundaries for their mental health. She actively contributed to discussions, asked questions, and provided personal and specific examples related to boundary-setting. The patient showed a willingness to explore and practice boundary-setting skills and expresses a desire to improve their overall well-being. She demonstrated an understanding of the impact of boundaries on their activities of daily living  and expressed motivation to incorporate healthy boundaries into their daily life. The patient's active engagement and receptiveness indicate a potential for successful implementation of boundary-setting strategies.  ? ?P: Continue to attend PHP OT group sessions 5x week for 4 weeks to promote daily structure, social engagement, and opportunities to develop and utilize adaptive strategies to maximize functional performance in preparation for safe transition and integration back into school, work, and the community. Plan to address topic of tbd in next OT group session. ? ? ? ? ? ? ? ? ? ? ? ? ? ? ? ? ? ? ? OT Education - 03/04/22 2044   ? ? Education Details Boundaries for Improved Activities of Daily Living 2/2   ? Person(s) Educated Patient   ? Methods Explanation;Handout   ? Comprehension Verbalized understanding   ? ?  ?  ? ?  ? ? ? OT Short Term Goals - 02/21/22 2037   ? ?  ? OT SHORT TERM GOAL #1  ? Title Pt will be independnet w/ education and coping skills to use interventions learned in OT group in order to return to successful community living   ? Time 4   ? Period Weeks   ? Status New   ? Target Date 03/23/22   ?  ? OT SHORT TERM GOAL #2  ? Title Pt will demonstrate independence in goal setting skills in order to achieve independence in community re-entry   ?  ? OT SHORT TERM GOAL #3  ? Title pt will be independent w/ gradual exposure skills in order to independently and successfully engage in community re-entry.   ? ?  ?  ? ?  ? ? ? ? ? ? ? ? ? ? ? Plan - 03/04/22 2046   ? ? Psychosocial Skills Coping Strategies;Habits;Interpersonal Interaction;Routines and Behaviors   ? ?  ?  ? ?  ? ? ?Patient will benefit from skilled therapeutic intervention in order to improve the following deficits and impairments:   ?  ?  ?Psychosocial Skills: Coping Strategies, Habits, Interpersonal Interaction, Routines and Behaviors ? ? ?Visit Diagnosis: ?Difficulty coping ? ? ? ?Problem List ?Patient Active Problem List  ?  Diagnosis Date Noted  ? Major depressive disorder, recurrent episode, severe ([facility]) 02/14/2022  ? Panic disorder with agoraphobia 02/14/2022  ? Type 2 diabetes mellitus with other specified complication (HCC) 10/29/2021  ? Anxiety 10/29/2021  ? High cholesterol 10/29/2021  ? Hypertension 10/29/2021  ? Hypothyroidism 10/29/2021  ? Major depression in partial remission (HCC) 10/29/2021  ? Panic disorder 10/29/2021  ? Morbid obesity (HCC) 10/29/2021  ? Idiopathic intracranial hypertension 06/02/2015  ? Depression 02/22/2015  ? Pseudotumor cerebri 09/09/2011  ? Obesity, morbid (HCC) 09/09/2011  ? Vision disturbance 09/09/2011  ? ? ?Ted Mcalpine, OT ?03/04/2022, 8:46 PM ?Kerrin Champagne, OT ? ? ?[facility] ?[facility] ?[address] ?[place], [place], [address] ?Phone: [phone]   Fax:  [phone] ? ?Name: Lindsey [name] ?MRN: [id number] ?Date of Birth: [birth year] ? ?

## 2022-03-04 NOTE — Psych (Signed)
Virtual Visit via Video Note ? ?I connected with Lindsey [name] on 03/04/22 at  9:00 AM EDT by a video enabled telemedicine application and verified that I am speaking with the correct person using two identifiers. ? ?Location: ?Patient: pt's home ?Provider: clinical office ?  ?I discussed the limitations of evaluation and management by telemedicine and the availability of in person appointments. The patient expressed understanding and agreed to proceed. ?  ?I discussed the assessment and treatment plan with the patient. The patient was provided an opportunity to ask questions and all were answered. The patient agreed with the plan and demonstrated an understanding of the instructions. ?  ?The patient was advised to call back or seek an in-person evaluation if the symptoms worsen or if the condition fails to improve as anticipated. ? ?I provided 240 minutes of non-face-to-face time during this encounter. ? ? ?Wyvonnia Lora, LCSWA ? ? ?[facility] PHP THERAPIST PROGRESS NOTE ? ?Lindsey [name] ?[id number] ? ? ?Session Time: 9:00 am - 10:00 am ? ?Participation Level: Active ? ?Behavioral Response: CasualAlertDepressed ? ?Type of Therapy: Group Therapy ? ?Treatment Goals addressed: Coping ? ?Progress Towards Goals: Progressing ? ?Interventions: CBT, DBT, Solution Focused, Strength-based, Supportive, and Reframing ? ?Therapist Response: Clinician led check-in regarding current stressors and situation, and review of patient completed daily inventory. Clinician utilized active listening and empathetic response and validated patient emotions. Clinician facilitated processing group on pertinent issues.?  ? ?Summary: Lindsey [name] is a 36yo female who presents with anxiety and depression symptoms and agoraphobia. Patient arrived within time allowed. Patient rates her mood at a 2 on a scale of 1-10 with 10 being best. Pt reported, "I hate that I keep coming on here and it?s like the only thing I talk about is my mom. I?m under a  lot of stress." She reports passive SI but denies plan or intent. Sleep was good, appetite is decreased and states she only ate twice this week. Cln recommended meal replacement shake. Pt states she does not have a particular topic she wants to discuss during processing. Pt able to process.?Pt engaged in discussion. Pt's peers verbalized support and pt expressed appreciation. ?  ? ? ?Session Time: 10:00 am - 11:00 am ? ?Participation Level: None ? ?Behavioral Response: CasualAlertDepressed ? ?Type of Therapy: Group Therapy ? ?Treatment Goals addressed: Coping ? ?Progress Towards Goals: Progressing ? ?Interventions: CBT, DBT, Solution Focused, Strength-based, Supportive, and Reframing ? ?Therapist Response: Clinician led group on transitioning out of group and practicing self-compassion. Clinician utilized CBT principles to inform discussion.  ? ?Summary: Pt did not engage in discussion. Her camera was on but she was not visible on camera. ? ? ? ?Session Time: 11:00 am - 12:00 pm ? ?Participation Level: None ? ?Behavioral Response: CasualAlertDepressed ? ?Type of Therapy: Group Therapy ? ?Treatment Goals addressed: Coping ? ?Progress Towards Goals: Progressing ? ?Interventions: CBT, DBT, Solution Focused, Strength-based, Supportive, and Reframing ? ?Therapist Response: : Guest speaker Alex from the [facility], who provides information about [employer] and answers pt questions.  ? ?Summary: Pt did not engage speaker. After presentation, pt confirmed she was still there but stated she does not feel up to engaging. ? ? ?Session Time: 12:00 pm - 1:00 pm ? ?Participation Level: None ? ?Behavioral Response: CasualAlertDepressed ? ?Type of Therapy: Group Therapy ? ?Treatment Goals addressed: Coping ? ?Progress Towards Goals: Progressing ? ?Interventions: CBT, DBT, Solution Focused, Strength-based, Supportive, and Reframing ? ?Therapist Response: 12:00 - 12:50 pm: See OT note.  12:50 - 1:00 pm: Clinician led  check-out. Clinician assessed for immediate needs, medication compliance and efficacy, and safety concerns? ? ?Summary: 12:00 - 12:50 pm: See OT note. 12:50 - 1:00 pm: At check-out, patient rates her mood at a 2 on a scale of 1-10 with 10 being great. Patient demonstrates limited progress as evidenced by her continued attendance but has limited engagement during session. Patient endorses passive suicidal thoughts at the end of group and but denies plan and intent agrees to seek help should those thoughts/feelings worsen.?  ? ?Suicidal/Homicidal: Yeswithout intent/plan ? ?Plan: ?Pt will continue in PHP and medication management while continuing to work on decreasing depression symptoms,?SI, and anxiety symptoms,?and increasing the ability to self manage symptoms.  ?  ?Collaboration of Care: Medication Management AEB Hillery Jacks, NP ? ?Patient/Guardian was advised Release of Information must be obtained prior to any record release in order to collaborate their care with an outside provider. Patient/Guardian was advised if they have not already done so to contact the registration department to sign all necessary forms in order for [name] to release information regarding their care.  ? ?Consent: Patient/Guardian gives verbal consent for treatment and assignment of benefits for services provided during this visit. Patient/Guardian expressed understanding and agreed to proceed.  ? ?Diagnosis: Severe episode of recurrent major depressive disorder, without psychotic features (HCC) [F33.2] ?   ?1. Severe episode of recurrent major depressive disorder, without psychotic features (HCC)   ?2. Panic disorder with agoraphobia   ? ? ? ? ?[facility] ?03/04/2022 ? ?

## 2022-03-05 ENCOUNTER — Encounter (HOSPITAL_COMMUNITY): Payer: Self-pay | Admitting: Family

## 2022-03-05 NOTE — Progress Notes (Signed)
Virtual Visit via Video Note  I connected with Lindsey [name] on 03/05/22 at  9:00 AM EDT by a video enabled telemedicine application and verified that I am speaking with the correct person using two identifiers.  Location: Patient: Home Provider: Office   I discussed the limitations of evaluation and management by telemedicine and the availability of in person appointments. The patient expressed understanding and agreed to proceed.    I discussed the assessment and treatment plan with the patient. The patient was provided an opportunity to ask questions and all were answered. The patient agreed with the plan and demonstrated an understanding of the instructions.   The patient was advised to call back or seek an in-person evaluation if the symptoms worsen or if the condition fails to improve as anticipated.  I provided 15 minutes of non-face-to-face time during this encounter.   Lindsey Rackanika N Huriel Matt, NP   [facility] MD/PA/NP OP Progress Note  03/05/2022 11:54 AM Lindsey BloodSierra [name]  MRN:  [id number]  Chief Complaint:  " good and bad days"  HPI: Lindsey [name] was seen and evaluated via WebEx.  She presents flat guarded but pleasant.  Denying suicidal or homicidal ideations.  Denies auditory visual hallucinations.  Patient is requesting for her medical records to be sent to her current provider as she reports she has not been able to get her Xanax change to Ativan.  States Scientist, clinical (histocompatibility and immunogenetics)nationwide shortage and her current pharmacy does not have this medication in stock.  States she was declined Ativan because her prescription for Xanax was already sent to the pharmacy.  NP requested that patient provide authorization to release information and follow-up with case management.   Raoul PitchSierra continues to report symptoms of worry related to "no income" at this time.  States she is unable to return to work because she is triggered by "demand to pay."    states her rent is due and she is not able to afford the apartment.  She  states she has not yet been evicted and was offered to sign a promises to pay, however she stated that financial assistance programs will only assist if a eviction notice has been issued. She stated "I do not know what I should do? A  promised to pay or allow my management office to file for eviction because there is no guarantee that I will be able to receive assistance."   She denied any other illicit drug use or substance abuse history.  Continues to states she is not able to go out of the house due to her worsening social anxiety and agoraphobia.  She reports a good appetite.  States she is resting okay throughout the night.    Visit Diagnosis:    ICD-10-CM   1. Severe episode of recurrent major depressive disorder, without psychotic features (HCC)  F33.2     2. Panic disorder with agoraphobia  F40.01       Past Psychiatric History:   Past Medical History:  Past Medical History:  Diagnosis Date   Anxiety and depression    Depression 02/22/2015   Diabetes (HCC)    Headache(784.0)    High cholesterol    Hypercholesteremia    Hyperlipidemia    Hypertension    Hypothyroidism    Obesity    Panic disorder    Polycystic ovary    Pseudotumor cerebri    PTSD (post-traumatic stress disorder)     Past Surgical History:  Procedure Laterality Date   LUMBAR PUNCTURE      Family  Psychiatric History:  Family History:  Family History  Problem Relation Age of Onset   Polycystic ovary syndrome Mother    Coronary artery disease Mother    Kidney disease Father    Narcolepsy Maternal Grandmother    Breast cancer Maternal Grandmother    Heart failure Maternal Grandfather    Cancer Other    Coronary artery disease Other    Diabetes Other     Social History:  Social History   Socioeconomic History   Marital status: Single    Spouse name: Not on file   Number of children: 0   Years of education: 12   Highest education level: High school graduate  Occupational History    Occupation: Engineer, 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  Tobacco Use   Smoking status: Former    Packs/day: 0.25    Types: Cigarettes   Smokeless tobacco: Never  Substance and Sexual Activity   Alcohol use: No    Alcohol/week: 0.0 standard drinks   Drug use: Yes    Types: Marijuana    Comment: no longer using   Sexual activity: Yes    Partners: Male  Other Topics Concern   Not on file  Social History Narrative   Lives alone.   Right-handed.   No daily caffeine use.   Social Determinants of Health   Financial Resource Strain: Not on file  Food Insecurity: Not on file  Transportation Needs: Not on file  Physical Activity: Not on file  Stress: Not on file  Social Connections: Not on file    Allergies:  Allergies  Allergen Reactions   Bupropion Other (See Comments), Palpitations and Shortness Of Breath   Anti-Inflammatory Enzyme [Nutritional Supplements] Swelling    And fluid on the brain   Hydroxyzine Other (See Comments)   Metformin Nausea Only and Other (See Comments)   Metformin Hcl Er Other (See Comments)   Nsaids Other (See Comments), Hypertension and Swelling   Rosuvastatin Nausea And Vomiting and Other (See Comments)    Metabolic Disorder Labs: No results found for: HGBA1C, MPG No results found for: PROLACTIN No results found for: CHOL, TRIG, HDL, CHOLHDL, VLDL, LDLCALC No results found for: TSH  Therapeutic Level Labs: No results found for: LITHIUM No results found for: VALPROATE No components found for:  CBMZ  Current Medications: Current Outpatient Medications  Medication Sig Dispense Refill   acetaZOLAMIDE ER (DIAMOX) 500 MG capsule Take 1 capsule (500 mg total) by mouth in the morning, at noon, and at bedtime. (Patient taking differently: Take 500 mg by mouth 2 (two) times daily.) 90 capsule 12   ALPRAZolam (XANAX) 0.25 MG tablet Take 0.25 mg by mouth daily.     ASHWAGANDHA PO Take 1,300 mg by mouth daily. (Patient not taking: Reported on 02/21/2022)     B  Complex CAPS See admin instructions.     glimepiride (AMARYL) 2 MG tablet Take 2 mg by mouth daily.     irbesartan (AVAPRO) 300 MG tablet Take 300 mg by mouth daily. (Patient not taking: Reported on 03/01/2022)     levothyroxine (SYNTHROID) 50 MCG tablet Take 50 mcg by mouth daily.     Multiple Vitamin (MULTIVITAMIN) tablet Take 1 tablet by mouth daily.     pioglitazone (ACTOS) 45 MG tablet Take 45 mg by mouth daily.     pravastatin (PRAVACHOL) 20 MG tablet Take 20 mg by mouth daily.     propranolol (INDERAL) 20 MG tablet Take 20 mg by mouth daily.  QUEtiapine (SEROQUEL) 50 MG tablet Take 50 mg by mouth at bedtime.     venlafaxine (EFFEXOR) 75 MG tablet Take 75 mg by mouth daily.     No current facility-administered medications for this visit.     Musculoskeletal: Strength & Muscle Tone: within normal limits Gait & Station: normal Patient leans: N/A  Psychiatric Specialty Exam: Review of Systems  There were no vitals taken for this visit.There is no height or weight on file to calculate BMI.  General Appearance: Casual  Eye Contact:  Good  Speech:  Clear and Coherent  Volume:  Normal  Mood:  Anxious and Depressed  Affect:  Congruent  Thought Process:  Coherent  Orientation:  Full (Time, Place, and Person)  Thought Content: Logical   Suicidal Thoughts:  No  Homicidal Thoughts:  No  Memory:  Immediate;   Fair Recent;   Fair  Judgement:  Fair  Insight:  Fair  Psychomotor Activity:  Normal  Concentration:  Concentration: Good  Recall:  Good  Fund of Knowledge: Good  Language: Good  Akathisia:  No  Handed:  Right  AIMS (if indicated): done  Assets:  Communication Skills Desire for Improvement Social Support  ADL's:  Intact  Cognition: WNL  Sleep:  Fair   Screenings: Insurance account manager from 02/21/2022 in [facility] Counselor from 02/14/2022 in [facility]GRAM  PHQ-2 Total Score 4 6   PHQ-9 Total Score 21 12      Flowsheet Row Counselor from 02/21/2022 in [facility] Counselor from 02/14/2022 in [facility]  C-SSRS RISK CATEGORY Error: Q3, 4, or 5 should not be populated when Q2 is No Error: Question 6 not populated        Assessment and Plan: Continue partial hospitalization programming Keep all outpatient follow-up appointments  Collaboration of Care: Collaboration of Care: Medication Management AEB    Patient/Guardian was advised Release of Information must be obtained prior to any record release in order to collaborate their care with an outside provider. Patient/Guardian was advised if they have not already done so to contact the registration department to sign all necessary forms in order for [name] to release information regarding their care.   Consent: Patient/Guardian gives verbal consent for treatment and assignment of benefits for services provided during this visit. Patient/Guardian expressed understanding and agreed to proceed.    Lindsey Rack, NP 03/05/2022, 11:54 AM

## 2022-03-07 ENCOUNTER — Other Ambulatory Visit (HOSPITAL_COMMUNITY): Payer: 59 | Admitting: Licensed Clinical Social Worker

## 2022-03-07 ENCOUNTER — Encounter (HOSPITAL_COMMUNITY): Payer: Self-pay

## 2022-03-07 ENCOUNTER — Other Ambulatory Visit (HOSPITAL_COMMUNITY): Payer: 59

## 2022-03-07 DIAGNOSIS — R4589 Other symptoms and signs involving emotional state: Secondary | ICD-10-CM

## 2022-03-07 DIAGNOSIS — F332 Major depressive disorder, recurrent severe without psychotic features: Secondary | ICD-10-CM

## 2022-03-07 DIAGNOSIS — F4001 Agoraphobia with panic disorder: Secondary | ICD-10-CM

## 2022-03-07 NOTE — Therapy (Signed)
[facility] ?[address] ?[place], [place], [address] ?Phone: [phone]   Fax:  [phone] ? ?Occupational Therapy Treatment ?Virtual Visit via Video Note ? ?I connected with [name] on 03/07/22 at  8:00 AM EDT by a video enabled telemedicine application and verified that I am speaking with the correct person using two identifiers. ? ?Location: ?Patient: home ?Provider: office ?  ?I discussed the limitations of evaluation and management by telemedicine and the availability of in person appointments. The patient expressed understanding and agreed to proceed. ? ?  ?The patient was advised to call back or seek an in-person evaluation if the symptoms worsen or if the condition fails to improve as anticipated. ? ?I provided 60 minutes of non-face-to-face time during this encounter. ? ? ?Patient Details  ?Name: Lindsey [name] ?MRN: [id number] ?Date of Birth: Feb 20, 1986 ?No data recorded ? ?Encounter Date: 03/07/2022 ? ? OT End of Session - 03/07/22 1315   ? ? Visit Number 10   ? Number of Visits 20   ? Date for OT Re-Evaluation 03/23/22   ? Authorization Type [employer] [facility]   ? Authorization Time Period [facility]  1/1-12/31/202 // Visit limit 90- 0 used Combined with PT/OT/ ST   No Kaleen Odea [id number]   ? Authorization - Visit Number 90   ? OT Start Time 1100   ? OT Stop Time 1200   ? OT Time Calculation (min) 60 min   ? Equipment Utilized During Treatment power point / webx   ? Activity Tolerance Patient tolerated treatment well   ? Behavior During Therapy [facility] for tasks assessed/performed   ? ?  ?  ? ?  ? ? ?Past Medical History:  ?Diagnosis Date  ? Anxiety and depression   ? Depression 02/22/2015  ? Diabetes (HCC)   ? Headache(784.0)   ? High cholesterol   ? Hypercholesteremia   ? Hyperlipidemia   ? Hypertension   ? Hypothyroidism   ? Obesity   ? Panic disorder   ? Polycystic ovary   ? Pseudotumor cerebri   ? PTSD  (post-traumatic stress disorder)   ? ? ?Past Surgical History:  ?Procedure Laterality Date  ? LUMBAR PUNCTURE    ? ? ?There were no vitals filed for this visit. ? ? Subjective Assessment - 03/07/22 1315   ? ? Currently in Pain? No/denies   ? Pain Score 0-No pain   ? Multiple Pain Sites No   ? ?  ?  ? ?  ? ? ? ? ?Group Session: ? ?S: "sometimes I find it hard just to take a shower because of my anxiety and depression / lack of motivation. This is a topic I really need to explore" ? ?O: The objective of the telehealth group therapy session was to discuss the significance of routines in promoting mental health and wellbeing. The therapist aimed to explore the power of routines in providing structure, stability, and predictability in individuals' lives, as well as their role in establishing healthy habits, reducing stress and anxiety, managing time effectively, achieving goals, and fostering a sense of community and social connectedness. Participants were guided to identify areas where routines could improve their mental health and were provided with tips for establishing and maintaining healthy routines. The session concluded by emphasizing the potential of routines to enhance overall quality of life. ? ?Homework Assignment: As part of the session, participants were assigned a homework task to reflect on their  current routines and select one area of their lives where they could establish a new routine to improve their mental health and wellbeing. They were instructed to start small and implement the routine gradually, while seeking support from friends, family, or mental health professionals as needed. The participants were asked to report their progress in the next therapy session, focusing on the benefits and challenges encountered during the implementation of their chosen routine. ? ?A: During the telehealth group therapy session, the patient actively participated in the discussion on the importance of routines in  promoting mental health and wellbeing. The patient demonstrated a good understanding of the power of routines in providing structure, stability, and predictability in their life. They were able to identify areas in their life where routines could contribute to improving their overall mental health. ? ?The patient showed motivation and willingness to reflect on their current habits and behaviors, recognizing the need for positive changes. They actively engaged in the session, sharing personal experiences and challenges related to establishing and maintaining healthy routines. The patient expressed a desire to reduce stress and anxiety, manage their time more effectively, and achieve their goals through the implementation of routines. ? ?Moreover, the patient actively participated in the discussion of tips for establishing and maintaining healthy routines. They demonstrated an understanding of the importance of starting small, being consistent, and gradually increasing the complexity of their routines. The patient expressed a willingness to remain flexible and adaptable, acknowledging that adjustments might be necessary as circumstances and priorities change over time. ? ?Based on the patient's active participation, understanding of the concepts discussed, and their motivation to implement positive changes in their life, it can be assessed that they have a good potential to benefit from incorporating healthy routines into their daily life. The patient's commitment to reflecting on their current routines and selecting an area for improvement indicates a proactive approach to their mental health and wellbeing.  ? ? ?P: Continue to attend PHP OT group sessions 5x week for 4 weeks to promote daily structure, social engagement, and opportunities to develop and utilize adaptive strategies to maximize functional performance in preparation for safe transition and integration back into school, work, and the community. Plan to  address topic of routines 2/2 in next OT group session. ? ? ? ? ? ? ? ? ? ? ? ? ? ? ? ? ? ? ? OT Education - 03/07/22 1315   ? ? Education Details The Power of Routines 1/2   ? Person(s) Educated Patient   ? Methods Explanation;Handout   ? Comprehension Verbalized understanding   ? ?  ?  ? ?  ? ? ? OT Short Term Goals - 02/21/22 2037   ? ?  ? OT SHORT TERM GOAL #1  ? Title Pt will be independnet w/ education and coping skills to use interventions learned in OT group in order to return to successful community living   ? Time 4   ? Period Weeks   ? Status New   ? Target Date 03/23/22   ?  ? OT SHORT TERM GOAL #2  ? Title Pt will demonstrate independence in goal setting skills in order to achieve independence in community re-entry   ?  ? OT SHORT TERM GOAL #3  ? Title pt will be independent w/ gradual exposure skills in order to independently and successfully engage in community re-entry.   ? ?  ?  ? ?  ? ? ? ? ? ? ? ? ? ? ?  Plan - 03/07/22 1316   ? ? Psychosocial Skills Coping Strategies;Habits;Interpersonal Interaction;Routines and Behaviors   ? ?  ?  ? ?  ? ? ?Patient will benefit from skilled therapeutic intervention in order to improve the following deficits and impairments:   ?  ?  ?Psychosocial Skills: Coping Strategies, Habits, Interpersonal Interaction, Routines and Behaviors ? ? ?Visit Diagnosis: ?Difficulty coping ? ? ? ?Problem List ?Patient Active Problem List  ? Diagnosis Date Noted  ? Major depressive disorder, recurrent episode, severe (HCC) 02/14/2022  ? Panic disorder with agoraphobia 02/14/2022  ? Type 2 diabetes mellitus with other specified complication (HCC) 10/29/2021  ? Anxiety 10/29/2021  ? High cholesterol 10/29/2021  ? Hypertension 10/29/2021  ? Hypothyroidism 10/29/2021  ? Major depression in partial remission (HCC) 10/29/2021  ? Panic disorder 10/29/2021  ? Morbid obesity (HCC) 10/29/2021  ? Idiopathic intracranial hypertension 06/02/2015  ? Depression 02/22/2015  ? Pseudotumor cerebri  09/09/2011  ? Obesity, morbid (HCC) 09/09/2011  ? Vision disturbance 09/09/2011  ? ? ?Ted McalpineEdward G Hollan, OT ?03/07/2022, 1:16 PM ? ?Kerrin ChampagneEdward Hollan, OT ? ? ?[facility]

## 2022-03-08 ENCOUNTER — Encounter (HOSPITAL_COMMUNITY): Payer: Self-pay

## 2022-03-08 ENCOUNTER — Other Ambulatory Visit (HOSPITAL_COMMUNITY): Payer: 59

## 2022-03-08 ENCOUNTER — Other Ambulatory Visit (HOSPITAL_COMMUNITY): Payer: 59 | Admitting: Licensed Clinical Social Worker

## 2022-03-08 DIAGNOSIS — F332 Major depressive disorder, recurrent severe without psychotic features: Secondary | ICD-10-CM | POA: Diagnosis not present

## 2022-03-08 DIAGNOSIS — F4001 Agoraphobia with panic disorder: Secondary | ICD-10-CM

## 2022-03-08 DIAGNOSIS — R4589 Other symptoms and signs involving emotional state: Secondary | ICD-10-CM

## 2022-03-08 NOTE — Therapy (Signed)
[place] ?[facility] ?[place], [place], [address] ?Phone: [phone]   Fax:  [phone] ? ?Occupational Therapy Treatment ? ?Virtual Visit via Video Note ? ?I connected with Cornelia Copa on 03/08/22 at  8:00 AM EDT by a video enabled telemedicine application and verified that I am speaking with the correct person using two identifiers. ? ?Location: ?Patient: home ?Provider: ofice ?  ?I discussed the limitations of evaluation and management by telemedicine and the availability of in person appointments. The patient expressed understanding and agreed to proceed. ? ?  ?The patient was advised to call back or seek an in-person evaluation if the symptoms worsen or if the condition fails to improve as anticipated. ? ?I provided 60 minutes of non-face-to-face time during this encounter. ? ? ?Patient Details  ?Name: Lindsey [name] ?MRN: [id number] ?Date of Birth: May 18, 1986 ?No data recorded ? ?Encounter Date: 03/08/2022 ? ? OT End of Session - 03/08/22 1340   ? ? Visit Number 11   ? Number of Visits 20   ? Date for OT Re-Evaluation 03/23/22   ? Authorization Type [employer] [facility]   ? Authorization Time Period [facility]  1/1-12/31/202 // Visit limit 90- 0 used Combined with PT/OT/ ST   No Jackolyn Confer [id number]   ? Authorization - Visit Number 90   ? OT Start Time 1100   ? OT Stop Time 1200   ? OT Time Calculation (min) 60 min   ? Equipment Utilized During Treatment power point / webx   ? Activity Tolerance Patient tolerated treatment well   ? Behavior During Therapy [facility] for tasks assessed/performed   ? ?  ?  ? ?  ? ? ?Past Medical History:  ?Diagnosis Date  ? Anxiety and depression   ? Depression 02/22/2015  ? Diabetes ([place])   ? Headache(784.0)   ? High cholesterol   ? Hypercholesteremia   ? Hyperlipidemia   ? Hypertension   ? Hypothyroidism   ? Obesity   ? Panic disorder   ? Polycystic ovary   ? Pseudotumor cerebri   ? PTSD  (post-traumatic stress disorder)   ? ? ?Past Surgical History:  ?Procedure Laterality Date  ? LUMBAR PUNCTURE    ? ? ?There were no vitals filed for this visit. ? ? Subjective Assessment - 03/08/22 1339   ? ? Currently in Pain? No/denies   ? Pain Score 0-No pain   ? Multiple Pain Sites No   ? ?  ?  ? ?  ? ? ? ? ?Group Session: ? ?S: "Feeling better. Using some routine techniques to improve my "out of house' exposure.  ? ?O: The objective of the telehealth group therapy session was to discuss the significance of routines in promoting mental health and wellbeing. The therapist aimed to explore the power of routines in providing structure, stability, and predictability in individuals' lives, as well as their role in establishing healthy habits, reducing stress and anxiety, managing time effectively, achieving goals, and fostering a sense of community and social connectedness. Participants were guided to identify areas where routines could improve their mental health and were provided with tips for establishing and maintaining healthy routines. The session concluded by emphasizing the potential of routines to enhance overall quality of life. ? ?A: During the telehealth group therapy session, the patient actively participated in the discussion on the importance of routines in promoting mental health and wellbeing. The patient demonstrated a good understanding  of the power of routines in providing structure, stability, and predictability in their life. They were able to identify areas in their life where routines could contribute to improving their overall mental health. ? ?The patient showed motivation and willingness to reflect on their current habits and behaviors, recognizing the need for positive changes. They actively engaged in the session, sharing personal experiences and challenges related to establishing and maintaining healthy routines. The patient expressed a desire to reduce stress and anxiety, manage their  time more effectively, and achieve their goals through the implementation of routines. ? ?Moreover, the patient actively participated in the discussion of tips for establishing and maintaining healthy routines. They demonstrated an understanding of the importance of starting small, being consistent, and gradually increasing the complexity of their routines. The patient expressed a willingness to remain flexible and adaptable, acknowledging that adjustments might be necessary as circumstances and priorities change over time. ? ?P: Continue to attend PHP OT group sessions 5x week for 4 weeks to promote daily structure, social engagement, and opportunities to develop and utilize adaptive strategies to maximize functional performance in preparation for safe transition and integration back into school, work, and the community. Plan to address topic of tbd in next OT group session. ? ? ? ? ? ? ? ? ? ? ? ? ? ? ? ? ? ? ? OT Education - 03/08/22 1340   ? ? Education Details The Power of Routines 2/2   ? Person(s) Educated Patient   ? Methods Explanation;Handout   ? Comprehension Verbalized understanding   ? ?  ?  ? ?  ? ? ? OT Short Term Goals - 02/21/22 2037   ? ?  ? OT SHORT TERM GOAL #1  ? Title Pt will be independnet w/ education and coping skills to use interventions learned in OT group in order to return to successful community living   ? Time 4   ? Period Weeks   ? Status New   ? Target Date 03/23/22   ?  ? OT SHORT TERM GOAL #2  ? Title Pt will demonstrate independence in goal setting skills in order to achieve independence in community re-entry   ?  ? OT SHORT TERM GOAL #3  ? Title pt will be independent w/ gradual exposure skills in order to independently and successfully engage in community re-entry.   ? ?  ?  ? ?  ? ? ? ? ? ? ? ? ? ? ? Plan - 03/08/22 1341   ? ? Psychosocial Skills Coping Strategies;Habits;Interpersonal Interaction;Routines and Behaviors   ? ?  ?  ? ?  ? ? ?Patient will benefit from skilled  therapeutic intervention in order to improve the following deficits and impairments:   ?  ?  ?Psychosocial Skills: Coping Strategies, Habits, Interpersonal Interaction, Routines and Behaviors ? ? ?Visit Diagnosis: ?Difficulty coping ? ? ? ?Problem List ?Patient Active Problem List  ? Diagnosis Date Noted  ? Major depressive disorder, recurrent episode, severe ([place]) 02/14/2022  ? Panic disorder with agoraphobia 02/14/2022  ? Type 2 diabetes mellitus with other specified complication ([place]) 123456  ? Anxiety 10/29/2021  ? High cholesterol 10/29/2021  ? Hypertension 10/29/2021  ? Hypothyroidism 10/29/2021  ? Major depression in partial remission ([place]) 10/29/2021  ? Panic disorder 10/29/2021  ? Morbid obesity ([place]) 10/29/2021  ? Idiopathic intracranial hypertension 06/02/2015  ? Depression 02/22/2015  ? Pseudotumor cerebri 09/09/2011  ? Obesity, morbid ([place]) 09/09/2011  ? Vision disturbance 09/09/2011  ? ? ?  Brantley Stage, OT ?03/08/2022, 1:41 PM ? ?Cornell Barman, OT ? ? ?[place] ?BE[facility] ?[place], [place], [address] ?Phone: [phone]   Fax:  [phone] ? ?Name: Lizania Eickman ?MRN: [id number] ?Date of Birth: 04/12/1986 ? ?

## 2022-03-09 ENCOUNTER — Other Ambulatory Visit (HOSPITAL_COMMUNITY): Payer: 59

## 2022-03-09 ENCOUNTER — Encounter (HOSPITAL_COMMUNITY): Payer: Self-pay | Admitting: Family

## 2022-03-09 ENCOUNTER — Other Ambulatory Visit (HOSPITAL_COMMUNITY): Payer: 59 | Admitting: Licensed Clinical Social Worker

## 2022-03-09 ENCOUNTER — Encounter (HOSPITAL_COMMUNITY): Payer: Self-pay

## 2022-03-09 DIAGNOSIS — F332 Major depressive disorder, recurrent severe without psychotic features: Secondary | ICD-10-CM | POA: Diagnosis not present

## 2022-03-09 DIAGNOSIS — R4589 Other symptoms and signs involving emotional state: Secondary | ICD-10-CM

## 2022-03-09 DIAGNOSIS — F4001 Agoraphobia with panic disorder: Secondary | ICD-10-CM

## 2022-03-09 NOTE — Progress Notes (Signed)
Spoke with patient via Webex video call, used 2 identifiers to correctly identify patient. States that she is still unable to get out of her house. She did chase the dog outside today but only because it got out and she had to get it back inside.  She was not able to start her Ativan that was prescribed last month because her pharmacy will not fill it due to having a prescription of xanax. She states that the xanax was to be discontinued and Ativan started but she has called her physician several times and told it was taken care of. That causes her more anxiety. She doesn't want to bug her doctor but would like to have her medication. She is glad that her blood pressure is under control now that she is taking Inderal. Has concerns over financial issues. Her disability was approved but she is 2 months behind on rent and not sure how she will pay it. Starts IOP next week. Denies SI/HI or AV hallucinations. Admits to having some passive SI at times without a plan but none today. On scale 1-10 as 10 being worst she rates depression at 6 and anxiety at 9. PHQ9=13. No side effects from medications. No issues or complaints.
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
## 2022-03-09 NOTE — Therapy (Signed)
?BE[facility]PITAL[facility] PROGRAM ?[address] ?[place], [place], [address] ?Phone: [phone]   Fax:  [phone] ? ?Occupational Therapy Treatment ?Virtual Visit via Video Note ? ?I connected with Lindsey [name] on 03/09/22 at  8:00 AM EDT by a video enabled telemedicine application and verified that I am speaking with the correct person using two identifiers. ? ?Location: ?Patient: home ?Provider: office ?  ?I discussed the limitations of evaluation and management by telemedicine and the availability of in person appointments. The patient expressed understanding and agreed to proceed. ? ?  ?The patient was advised to call back or seek an in-person evaluation if the symptoms worsen or if the condition fails to improve as anticipated. ? ?I provided 50 minutes of non-face-to-face time during this encounter. ? ? ?Patient Details  ?Name: Lindsey [name] ?MRN: [id number] ?Date of Birth: 1985-11-16 ?No data recorded ? ?Encounter Date: 03/09/2022 ? ? OT End of Session - 03/09/22 1812   ? ? Visit Number 12   ? Number of Visits 20   ? Date for OT Re-Evaluation 03/23/22   ? Authorization Type [employer] [facility]   ? Authorization Time Period [facility] Select  1/1-12/31/202 // Visit limit 90- 0 used Combined with PT/OT/ ST   No Kaleen Odea [id number]   ? Authorization - Visit Number 90   ? OT Start Time 1200   ? OT Stop Time 1250   ? OT Time Calculation (min) 50 min   ? Equipment Utilized During Treatment power point / webx   ? Activity Tolerance Patient tolerated treatment well   ? Behavior During Therapy [facility] for tasks assessed/performed   ? ?  ?  ? ?  ? ? ?Past Medical History:  ?Diagnosis Date  ? Anxiety and depression   ? Depression 02/22/2015  ? Diabetes ([facility])   ? Headache(784.0)   ? High cholesterol   ? Hypercholesteremia   ? Hyperlipidemia   ? Hypertension   ? Hypothyroidism   ? Obesity   ? Panic disorder   ? Polycystic ovary   ? Pseudotumor cerebri   ? PTSD  (post-traumatic stress disorder)   ? ? ?Past Surgical History:  ?Procedure Laterality Date  ? LUMBAR PUNCTURE    ? ? ?There were no vitals filed for this visit. ? ? Subjective Assessment - 03/09/22 1812   ? ? Pain Score 0-No pain   ? Multiple Pain Sites No   ? ?  ?  ? ?  ? ? ? ?Group Session: ? ?S: "I hope to find someway to help me find purpose in this life, this group is helping and today's group has helped too" ? ?O:  During the group session, the participants discussed the importance of having a growth mindset and shared personal stories of how they have bounced back from challenging situations. The cognitive domain was addressed through the presentation of a PowerPoint on resiliency, which included information on the cognitive-behavioral strategies for building resiliency, such as positive self-talk and reframing negative thoughts. The affective domain was addressed as participants were encouraged to reflect on their emotional responses to challenging situations and to identify effective coping strategies. The patient, who was an active participant, shared personal experiences and coping strategies that worked for them, demonstrating an understanding of the affective domain. The psychomotor domain was also addressed as participants were encouraged to reflect on their physical reactions to stress and to identify effective stress-reducing techniques, such as deep breathing exercises and  progressive muscle relaxation. The patient demonstrated an understanding of the psychomotor domain by sharing personal experiences of using relaxation techniques to manage stress. Overall, the session appeared to address each of the three domains, and the patient demonstrated an understanding of each domain through active participation in the group discussion. ? ?A: The patient demonstrated an understanding of resiliency and appeared to be motivated to continue building their own resiliency skills. The therapist will continue to  encourage the patient to share their experiences and strategies in future group sessions. ? ?P: Continue to attend PHP OT group sessions 5x week for 4 weeks to promote daily structure, social engagement, and opportunities to develop and utilize adaptive strategies to maximize functional performance in preparation for safe transition and integration back into school, work, and the community. Plan to address topic of Building Resilience in Adults with Depression and Anxiety 2/2 in next OT group session. ? ? ? ? ? ? ? ? ? ? ? ? ? ? ? ? ? ? ? ? OT Education - 03/09/22 1812   ? ? Education Details Building Resilience in Adults with Depression and Anxiety 1/2   ? Person(s) Educated Patient   ? Methods Explanation;Handout   ? Comprehension Verbalized understanding   ? ?  ?  ? ?  ? ? ? OT Short Term Goals - 02/21/22 2037   ? ?  ? OT SHORT TERM GOAL #1  ? Title Pt will be independnet w/ education and coping skills to use interventions learned in OT group in order to return to successful community living   ? Time 4   ? Period Weeks   ? Status New   ? Target Date 03/23/22   ?  ? OT SHORT TERM GOAL #2  ? Title Pt will demonstrate independence in goal setting skills in order to achieve independence in community re-entry   ?  ? OT SHORT TERM GOAL #3  ? Title pt will be independent w/ gradual exposure skills in order to independently and successfully engage in community re-entry.   ? ?  ?  ? ?  ? ? ? ? ? ? ? ? ? ? ? Plan - 03/09/22 1815   ? ? Psychosocial Skills Coping Strategies;Habits;Interpersonal Interaction;Routines and Behaviors   ? ?  ?  ? ?  ? ? ?Patient will benefit from skilled therapeutic intervention in order to improve the following deficits and impairments:   ?  ?  ?Psychosocial Skills: Coping Strategies, Habits, Interpersonal Interaction, Routines and Behaviors ? ? ?Visit Diagnosis: ?Difficulty coping ? ? ? ?Problem List ?Patient Active Problem List  ? Diagnosis Date Noted  ? Major depressive disorder, recurrent  episode, severe ([facility]) 02/14/2022  ? Panic disorder with agoraphobia 02/14/2022  ? Type 2 diabetes mellitus with other specified complication ([facility]) 10/29/2021  ? Anxiety 10/29/2021  ? High cholesterol 10/29/2021  ? Hypertension 10/29/2021  ? Hypothyroidism 10/29/2021  ? Major depression in partial remission ([facility]) 10/29/2021  ? Panic disorder 10/29/2021  ? Morbid obesity ([facility]) 10/29/2021  ? Idiopathic intracranial hypertension 06/02/2015  ? Depression 02/22/2015  ? Pseudotumor cerebri 09/09/2011  ? Obesity, morbid ([facility]) 09/09/2011  ? Vision disturbance 09/09/2011  ? ? ?Ted Mcalpine, OT ?03/09/2022, 6:16 PM ? ?Kerrin Champagne, OT ? ? ?[place] ?BE[facility] HOSPITAL[facility]GRAM ?[address] ?[place], [place], [address] ?Phone: [phone]   Fax:  [phone] ? ?Name: Lindsey [name] ?MRN: [id number] ?Date of Birth: May 20, 1986 ? ?

## 2022-03-10 ENCOUNTER — Other Ambulatory Visit (HOSPITAL_COMMUNITY): Payer: 59

## 2022-03-10 ENCOUNTER — Other Ambulatory Visit (HOSPITAL_COMMUNITY): Payer: 59 | Admitting: Licensed Clinical Social Worker

## 2022-03-10 ENCOUNTER — Encounter (HOSPITAL_COMMUNITY): Payer: Self-pay

## 2022-03-10 ENCOUNTER — Telehealth (HOSPITAL_COMMUNITY): Payer: Self-pay | Admitting: Psychiatry

## 2022-03-10 DIAGNOSIS — F332 Major depressive disorder, recurrent severe without psychotic features: Secondary | ICD-10-CM

## 2022-03-10 DIAGNOSIS — F4001 Agoraphobia with panic disorder: Secondary | ICD-10-CM

## 2022-03-10 DIAGNOSIS — R4589 Other symptoms and signs involving emotional state: Secondary | ICD-10-CM

## 2022-03-10 NOTE — Therapy (Signed)
[facility] PARTIAL HOSPITALIZATION PROGRAM [address] [place], [place], [address] Phone: [phone]   Fax:  [phone]  Occupational Therapy Treatment Virtual Visit via Video Note  I connected with Lindsey [name] on 03/10/22 at  8:00 AM EDT by a video enabled telemedicine application and verified that I am speaking with the correct person using two identifiers.  Location: Patient: home Provider: office   I discussed the limitations of evaluation and management by telemedicine and the availability of in person appointments. The patient expressed understanding and agreed to proceed.    The patient was advised to call back or seek an in-person evaluation if the symptoms worsen or if the condition fails to improve as anticipated.  I provided 55 minutes of non-face-to-face time during this encounter.   Patient Details  Name: Lindsey [name] MRN: [id number] Date of Birth: [birth year] No data recorded  Encounter Date: 03/10/2022   OT End of Session - 03/10/22 1337     Visit Number 13    Number of Visits 20    Date for OT Re-Evaluation 03/23/22    Authorization Type [facility]    Authorization Time Period [facility] Select  1/1-12/31/202 // Visit limit 90- 0 used Combined with PT/OT/ ST   No Kaleen Odea [id number]    Authorization - Visit Number 90    OT Start Time 1200    OT Stop Time 1255    OT Time Calculation (min) 55 min    Equipment Utilized During Treatment power point / webx    Activity Tolerance Patient tolerated treatment well    Behavior During Therapy [facility] for tasks assessed/performed             Past Medical History:  Diagnosis Date   Anxiety and depression    Depression 02/22/2015   Diabetes (HCC)    Headache(784.0)    High cholesterol    Hypercholesteremia    Hyperlipidemia    Hypertension    Hypothyroidism    Obesity    Panic disorder    Polycystic ovary    Pseudotumor cerebri    PTSD  (post-traumatic stress disorder)     Past Surgical History:  Procedure Laterality Date   LUMBAR PUNCTURE      There were no vitals filed for this visit.   Subjective Assessment - 03/10/22 1336     Currently in Pain? No/denies    Pain Score 0-No pain    Multiple Pain Sites No             Group Session:  S: I have been using more tools I have learned in group to better structure my life and have seen positive results  O: During the group session, the participants discussed the importance of having a growth mindset and shared personal stories of how they have bounced back from challenging situations. The cognitive domain was addressed through the presentation of a PowerPoint on resiliency, which included information on the cognitive-behavioral strategies for building resiliency, such as positive self-talk and reframing negative thoughts. The affective domain was addressed as participants were encouraged to reflect on their emotional responses to challenging situations and to identify effective coping strategies. The patient, who was an active participant, shared personal experiences and coping strategies that worked for them, demonstrating an understanding of the affective domain. The psychomotor domain was also addressed as participants were encouraged to reflect on their physical reactions to stress and to identify effective stress-reducing techniques, such as deep  breathing exercises and progressive muscle relaxation. The patient demonstrated an understanding of the psychomotor domain by sharing personal experiences of using relaxation techniques to manage stress. Overall, the session appeared to address each of the three domains, and the patient demonstrated an understanding of each domain through active participation in the group discussion.  A: The patient demonstrated an understanding of resiliency and appeared to be motivated to continue building their own resiliency skills. The  therapist will continue to encourage the patient to share their experiences and strategies in future group sessions.  P: Continue to attend PHP OT group sessions 5x week for 4 weeks to promote daily structure, social engagement, and opportunities to develop and utilize adaptive strategies to maximize functional performance in preparation for safe transition and integration back into school, work, and the community. Plan to address topic of Finding Purpose for Improved ADLs in next OT group session.                      OT Education - 03/10/22 1337     Education Details Building Resilience in Adults with Depression and Anxiety 2/2    Person(s) Educated Patient    Methods Explanation;Handout    Comprehension Verbalized understanding              OT Short Term Goals - 02/21/22 2037       OT SHORT TERM GOAL #1   Title Pt will be independnet w/ education and coping skills to use interventions learned in OT group in order to return to successful community living    Time 4    Period Weeks    Status New    Target Date 03/23/22      OT SHORT TERM GOAL #2   Title Pt will demonstrate independence in goal setting skills in order to achieve independence in community re-entry      OT SHORT TERM GOAL #3   Title pt will be independent w/ gradual exposure skills in order to independently and successfully engage in community re-entry.                      Plan - 03/10/22 1337     Psychosocial Skills Coping Strategies;Habits;Interpersonal Interaction;Routines and Behaviors             Patient will benefit from skilled therapeutic intervention in order to improve the following deficits and impairments:       Psychosocial Skills: Coping Strategies, Habits, Interpersonal Interaction, Routines and Behaviors   Visit Diagnosis: Difficulty coping    Problem List Patient Active Problem List   Diagnosis Date Noted   Major depressive disorder, recurrent  episode, severe (HCC) 02/14/2022   Panic disorder with agoraphobia 02/14/2022   Type 2 diabetes mellitus with other specified complication (HCC) 10/29/2021   Anxiety 10/29/2021   High cholesterol 10/29/2021   Hypertension 10/29/2021   Hypothyroidism 10/29/2021   Major depression in partial remission (HCC) 10/29/2021   Panic disorder 10/29/2021   Morbid obesity (HCC) 10/29/2021   Idiopathic intracranial hypertension 06/02/2015   Depression 02/22/2015   Pseudotumor cerebri 09/09/2011   Obesity, morbid (HCC) 09/09/2011   Vision disturbance 09/09/2011    Ted Mcalpine, OT 03/10/2022, 1:38 PM Kerrin Champagne, OT   [facility] HOSPITALIZATION PROGRAM [address] [place], [place], [address] Phone: [phone]   Fax:  [phone]  Name: Saraia Platner MRN: [id number] Date of Birth: 08/04/1986

## 2022-03-11 ENCOUNTER — Other Ambulatory Visit (HOSPITAL_COMMUNITY): Payer: 59

## 2022-03-11 ENCOUNTER — Other Ambulatory Visit (HOSPITAL_COMMUNITY): Payer: 59 | Admitting: Licensed Clinical Social Worker

## 2022-03-11 ENCOUNTER — Encounter (HOSPITAL_COMMUNITY): Payer: Self-pay

## 2022-03-11 DIAGNOSIS — F332 Major depressive disorder, recurrent severe without psychotic features: Secondary | ICD-10-CM | POA: Diagnosis not present

## 2022-03-11 DIAGNOSIS — R4589 Other symptoms and signs involving emotional state: Secondary | ICD-10-CM

## 2022-03-11 DIAGNOSIS — F4001 Agoraphobia with panic disorder: Secondary | ICD-10-CM

## 2022-03-11 NOTE — Therapy (Signed)
[place], [place], [address] Phone: [phone]   Fax:  [phone]  Occupational Therapy Treatment  Virtual Visit via Video Note  I connected with Cornelia Copa on 03/11/22 at  8:00 AM EDT by a video enabled telemedicine application and verified that I am speaking with the correct person using two identifiers.  Location: Patient: home Provider: office   I discussed the limitations of evaluation and management by telemedicine and the availability of in person appointments. The patient expressed understanding and agreed to proceed.    The patient was advised to call back or seek an in-person evaluation if the symptoms worsen or if the condition fails to improve as anticipated.  I provided 55 minutes of non-face-to-face time during this encounter.   Patient Details  Name: Miral Hoopes MRN: [id number] Date of Birth: May 30, 1986 No data recorded  Encounter Date: 03/11/2022   OT End of Session - 03/11/22 1725     Visit Number 14    Number of Visits 20    Date for OT Re-Evaluation 03/23/22    Authorization Type [place] Time Period [facility] Select  1/1-12/31/202 // Visit limit 90- 0 used Combined with PT/OT/ ST   No Jackolyn Confer [id number]    Authorization - Visit Number 65    OT Start Time 1200    OT Stop Time 1245    OT Time Calculation (min) 55 min    Equipment Utilized During Treatment power point / webx    Activity Tolerance Patient tolerated treatment well    Behavior During Therapy [facility] for tasks assessed/performed             Past Medical History:  Diagnosis Date   Anxiety and depression    Depression 02/22/2015   Diabetes ([facility])    Headache(784.0)    High cholesterol    Hypercholesteremia    Hyperlipidemia    Hypertension    Hypothyroidism    Obesity    Panic disorder    Polycystic ovary    Pseudotumor cerebri    PTSD  (post-traumatic stress disorder)     Past Surgical History:  Procedure Laterality Date   LUMBAR PUNCTURE      There were no vitals filed for this visit.   Subjective Assessment - 03/11/22 1724     Currently in Pain? No/denies    Pain Score 0-No pain                                  OT Education - 03/11/22 1724     Education Details Unveiling the Path to Personal Fulfillment / Discovering Purpose 1/2    Person(s) Educated Patient    Methods Explanation;Handout    Comprehension Verbalized understanding              OT Short Term Goals - 02/21/22 2037       OT SHORT TERM GOAL #1   Title Pt will be independnet w/ education and coping skills to use interventions learned in OT group in order to return to successful community living    Time 4    Period Weeks    Status met   Target Date 03/23/22      OT SHORT TERM GOAL #2   Title Pt will demonstrate independence in goal setting skills in order to achieve independence in community re-entry  met     OT SHORT TERM GOAL #3   Title pt will be independent w/ gradual exposure skills in order to independently and successfully engage in community re-entry.  met            Group Session:  S: feeling better about things going forward and using the tools I learned here. Thank you all so very much  O: In this group therapy session, the objective was to explore the multifaceted concept of purpose using insights from occupational therapy, anthropology, psychology, and life coaching. The participants engaged in a deep exploration of the innate human impulse for purposeful engagement and the impact of purpose on mental and emotional well-being. The discussion centered around the challenges of modern life, including feelings of isolation despite technological advancements, the connection between purposelessness and depression/anxiety, and the importance of occupational roles in achieving fulfillment. Strategies  were shared to identify areas of life where purpose is lacking and to develop immediate and actionable plans to overcome these challenges. The participants gained valuable insights into the intersection of purpose and mental health, as well as practical tools to navigate their personal journeys towards [place] lives. Overall, the session fostered a sense of empowerment and inspired the participants to take proactive steps towards aligning their lives with their true purpose.   A: During the group therapy session, Patient exhibited a lower level of active participation. They appeared more reserved and less engaged in the discussions surrounding purpose. Patient listened attentively to others' contributions but did not actively share more than two personal insights or experiences. Their level of interaction suggested a potential challenge in connecting with the concept of purpose or expressing their thoughts and feelings openly. Patient limited participation could be indicative of potential barriers or internal conflicts hindering their engagement.   P: All goals met at this time and will now discharge from this OT PHP program.     [place]  Visits from Start of Care: 14  Current functional level related to goals / functional outcomes: Pt demonstrated improvements in the psychosocial areas in deficit where she now demonstrates independence w/ community reentry    Remaining deficits: NA   Education / Equipment: Pt was provided w/ extensive power point presentation material related to the topics OT covered in this PHP group therapy program.   Plan: Patient agrees to discharge.           Patient will benefit from skilled therapeutic intervention in order to improve the following deficits and impairments:           Visit Diagnosis: Difficulty coping    Problem List Patient Active Problem List   Diagnosis Date Noted   Major depressive  disorder, recurrent episode, severe ([place]) 02/14/2022   Panic disorder with agoraphobia 02/14/2022   Type 2 diabetes mellitus with other specified complication ([place]) 51/76/1607   Anxiety 10/29/2021   High cholesterol 10/29/2021   Hypertension 10/29/2021   Hypothyroidism 10/29/2021   Major depression in partial remission ([place]) 10/29/2021   Panic disorder 10/29/2021   Morbid obesity ([place]) 10/29/2021   Idiopathic intracranial hypertension 06/02/2015   Depression 02/22/2015   Pseudotumor cerebri 09/09/2011   Obesity, morbid ([place]) 09/09/2011   Vision disturbance 09/09/2011    Brantley Stage, OT 03/11/2022, 5:27 PM Cornell Barman, [place], [place], [address] Phone: [phone]   Fax:  [phone]  Name: Tamme Mozingo MRN: [id number] Date of Birth:  25-Sep-1986

## 2022-03-14 ENCOUNTER — Other Ambulatory Visit (HOSPITAL_COMMUNITY): Payer: 59 | Admitting: Psychiatry

## 2022-03-14 ENCOUNTER — Other Ambulatory Visit (HOSPITAL_COMMUNITY): Payer: 59

## 2022-03-14 ENCOUNTER — Encounter (HOSPITAL_COMMUNITY): Payer: Self-pay | Admitting: Licensed Clinical Social Worker

## 2022-03-14 DIAGNOSIS — F332 Major depressive disorder, recurrent severe without psychotic features: Secondary | ICD-10-CM | POA: Diagnosis not present

## 2022-03-14 DIAGNOSIS — F4001 Agoraphobia with panic disorder: Secondary | ICD-10-CM
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
## 2022-03-14 NOTE — Progress Notes (Signed)
Virtual Visit via Video Note   I connected with Lindsey [name] on 03/14/22 at  9:00 AM EDT by a video enabled telemedicine application and verified that I am speaking with the correct person using two identifiers.   At orientation to the IOP program, Case Manager discussed the limitations of evaluation and management by telemedicine and the availability of in person appointments. The patient expressed understanding and agreed to proceed with virtual visits throughout the duration of the program.   Location:  Patient: Patient Home Provider: Counselor Home Office   History of Present Illness: MDD and Panic Disorder   Observations/Objective: Check In: Case Manager checked in with all participants to review discharge dates, insurance authorizations, work-related documents and needs from the treatment team regarding medications. Lindsey [name] stated needs and engaged in discussion.    Initial Therapeutic Activity: Counselor facilitated a check-in with Lindsey [name] to assess for safety, sobriety and medication compliance.  Counselor also inquired about Lindsey [name]'s current emotional ratings, as well as any significant changes in thoughts, feelings or behavior since previous check in.  Lindsey [name] presented for session on time and was alert, oriented x5, with no evidence or self-report of active SI/HI or A/V H.  Lindsey [name] reported compliance with medication and denied use of alcohol or illicit substances.  Lindsey [name] reported scores of 8/10 for depression, 6/10 for anxiety, and 0/10 for anger/irritability.  Lindsey [name] denied any recent outbursts.  Lindsey [name] reported that a recent struggle was having friends invite her out for plans over the weekend, but turning her phone off and 'ghosting' them due to anxiety, which caused a panic attack.  Lindsey [name] reported that her goal today is to spend time with her friend to make up for a recent issue they had.       Second Therapeutic Activity: Counselor provided psychoeducation on subject of boundaries  with group members today using a virtual handout.  This handout defined boundaries as the limits and rules that we set for ourselves within relationships, and featured a breakdown of the 3 common categories of boundaries (i.e. porous, rigid, and healthy), along with typical traits specific to each one for easy identification.  It was noted that most people have a mixture of different boundary types depending on setting, person, and culture.  Additional information was provided on the types of boundaries (i.e. physical, intellectual, emotional, sexual, material, and time) within relationships, and what could be considered healthy versus unhealthy. Counselor tasked members with identifying what types of boundaries they presently hold within her own support systems, the collective impact these boundaries have upon their mental health, and changes that could be made in order to more effectively communicate individual mental health needs.  Intervention was effective, as evidenced by Lindsey [name] actively engaging in discussion on topic, reporting that she has rigid boundaries with most supports in her network due to traits such as avoiding intimacy and close relationships, being unlikely to ask for help, having few close relationships, being overly protective of personal information, and keeping others at a distance to avid rejection.  Lindsey [name] reported that she believes she began to develop rigid boundaries during childhood, stating "My boundaries were often crossed as a child and not respected, so now I'm very protective of myself".  Lindsey [name] reported that she is more porous with one of her parents though, noting that she believes they have narcissistic traits, and this may have formed a codependent relationship.  Lindsey [name] reported that she would work to improve boundaries by establishing healthier boundaries with her parent while learning to  open up more with people she can trust through outlets such as group, stating "[name]  are here for a very similar reason. I feel like I can relate to them".    Assessment and Plan: Counselor recommends that Lindsey [name] remain in IOP treatment to better manage mental health symptoms, ensure stability and pursue completion of treatment plan goals. Counselor recommends adherence to crisis/safety plan, taking medications as prescribed, and following up with medical professionals if any issues arise.   Follow Up Instructions: Counselor will send Webex link for next session. Sivan was advised to call back or seek an in-person evaluation if the symptoms worsen or if the condition fails to improve as anticipated.   Collaboration of Care:   Medication Management AEB Hillery Jacks, NP                                           Case Manager AEB Jeri Modena, CNA    Patient/Guardian was advised Release of Information must be obtained prior to any record release in order to collaborate their care with an outside provider. Patient/Guardian was advised if they have not already done so to contact the registration department to sign all necessary forms in order for Lindsey [name] to release information regarding their care.   Consent: Patient/Guardian gives verbal consent for treatment and assignment of benefits for services provided during this visit. Patient/Guardian expressed understanding and agreed to proceed.  I provided 180 minutes of non-face-to-face time during this encounter.   Noralee Stain, LCSW, LCAS 03/14/22

## 2022-03-15 ENCOUNTER — Other Ambulatory Visit (HOSPITAL_COMMUNITY): Payer: 59 | Admitting: Licensed Clinical Social Worker

## 2022-03-15 ENCOUNTER — Other Ambulatory Visit (HOSPITAL_COMMUNITY): Payer: 59

## 2022-03-15 DIAGNOSIS — F332 Major depressive disorder, recurrent severe without psychotic features: Secondary | ICD-10-CM

## 2022-03-15 DIAGNOSIS — F4001 Agoraphobia with panic disorder: Secondary | ICD-10-CM

## 2022-03-15 NOTE — Plan of Care (Signed)
Patient is an active participant in her treatment plan. ?

## 2022-03-15 NOTE — Progress Notes (Signed)
Virtual Visit via Video Note   I connected with [name] on 03/15/22 at  9:00 AM EDT by a video enabled telemedicine application and verified that I am speaking with the correct person using two identifiers.   At orientation to the IOP program, Case Manager discussed the limitations of evaluation and management by telemedicine and the availability of in person appointments. The patient expressed understanding and agreed to proceed with virtual visits throughout the duration of the program.   Location:  Patient: Patient Home Provider: [facility]   History of Present Illness: MDD and Panic Disorder   Observations/Objective: Check In: Case Manager checked in with all participants to review discharge dates, insurance authorizations, work-related documents and needs from the treatment team regarding medications. [place] stated needs and engaged in discussion.    Initial Therapeutic Activity: Counselor facilitated a check-in with [name] to assess for safety, sobriety and medication compliance.  Counselor also inquired about Tinzlee's current emotional ratings, as well as any significant changes in thoughts, feelings or behavior since previous check in.  [name] presented for session on time and was alert, oriented x5, with no evidence or self-report of active SI/HI or A/V H.  Anabelle reported compliance with medication and denied use of alcohol or illicit substances.  Jayda reported scores of 9/10 for depression, 8/10 for anxiety, and 0/10 for anger/irritability.  [name] denied any recent panic attacks.  [name] reported that a recent success was taking time to relax yesterday with her dogs.  [name] reported that a recent struggle was having an outburst yesterday when she was feeling irritable, stating "I went off on someone that didn't deserve it".  [name] reported that this did lead to a productive conversation afterward when she apologized, and her goal today is to engage in some self-care like  watching a movie, and then look into rental assistance programs.       Second Therapeutic Activity: Second Therapeutic Activity: Counselor introduced [employer], [facility] to provide psychoeducation on topic of Grief and Loss with members today.  [name] began discussion by checking in with the group about their baseline mood today, general thoughts on what grief means to them and how it has affected them personally in the past.  [name] provided information on how the process of grief/loss can differ depending upon one's unique culture, and categories of loss one could experience (i.e. loss of a person, animal, relationship, job, identity, etc).  [name] encouraged members to be mindful of how pervasive loss can be, and how to recognize signs which could indicate that this is having an impact on one's overall mental health and wellbeing.  Intervention was effective, as evidenced by [name] participating in discussion with speaker on the subject, reporting that one loss she has been struggling with was the loss of her partner's brother, who died by suicide unexpectedly, stating "We still have no idea why this happened.  It just wasn't like him".  [name] reported that trying to think of things to look forward to in the future helps distract her at times, such as spending quality time with her family.  [name] stated "If you're just patient, you find that good things do come back around in time".    Third Therapeutic Activity: Counselor introduced topic of creating mental health maintenance plan today.  Counselor provided handout on subject to members, which stressed the importance of maintaining one's mental health in a similar way to using diet and exercise to ensure physical health.  Counselor walked  members through process of identifying triggers which could worsen symptoms, including specific people, places, and things one needs to avoid.  Members were also tasked with identifying warning signs such as  thoughts, feelings, or behaviors which could indicate mental health is at increased risk.  Counselor also facilitated conversation on self-care activities and coping strategies which members have previously utilized in the past, are currently using in daily routine, or plan to use soon to assist with managing problems or symptoms when/if they appear.  Counselor encouraged members to revisit their maintenance plan often and make changes as needed to ensure day to day stability.  Intervention was effective, as evidenced by [name] participating in activity and creating a comprehensive plan, including identification of triggers such as going outside of the home, feeling humiliated, worthless or powerless, being in large crowds, hearing or seeing angry people, and warning signs including experiencing symptoms of burnout at work, isolating from others, neglecting self-care, experiencing suicidal thoughts, attempting to mask emotions, and feelings of worthlessness.  Manon also reported that she would make an effort to set aside time for self-care activities such as prioritizing personal hygiene routine by doing her hair and nails, exploring old hobbies such as fishing, drawing, or painting and use coping skills such as guided meditation, listening to relaxing music, playing with her dogs for distraction, or attending therapy regularly to manage stressors.    Assessment and Plan: Counselor recommends that [name] remain in IOP treatment to better manage mental health symptoms, ensure stability and pursue completion of treatment plan goals. Counselor recommends adherence to crisis/safety plan, taking medications as prescribed, and following up with medical professionals if any issues arise.   Follow Up Instructions: Counselor will send Webex link for next session. Tawyna was advised to call back or seek an in-person evaluation if the symptoms worsen or if the condition fails to improve as anticipated.   Collaboration  of Care:   Medication Management AEB Hillery Jacks, NP                                           Case Manager AEB Jeri Modena, CNA    Patient/Guardian was advised Release of Information must be obtained prior to any record release in order to collaborate their care with an outside provider. Patient/Guardian was advised if they have not already done so to contact the registration department to sign all necessary forms in order for [name] to release information regarding their care.   Consent: Patient/Guardian gives verbal consent for treatment and assignment of benefits for services provided during this visit. Patient/Guardian expressed understanding and agreed to proceed.  I provided 180 minutes of non-face-to-face time during this encounter.   Noralee Stain, LCSW, LCAS 03/15/22

## 2022-03-15 NOTE — Plan of Care (Signed)
Patient is an active participant in her treatment.

## 2022-03-16 ENCOUNTER — Other Ambulatory Visit (HOSPITAL_COMMUNITY): Payer: 59 | Admitting: Licensed Clinical Social Worker

## 2022-03-16 DIAGNOSIS — F332 Major depressive disorder, recurrent severe without psychotic features: Secondary | ICD-10-CM | POA: Diagnosis not present

## 2022-03-16 DIAGNOSIS — F4001 Agoraphobia with panic disorder: Secondary | ICD-10-CM

## 2022-03-16 NOTE — Progress Notes (Signed)
Virtual Visit via Video Note   I connected with Lindsey [name] on 03/16/22 at  9:00 AM EDT by a video enabled telemedicine application and verified that I am speaking with the correct person using two identifiers.   At orientation to the IOP program, Case Manager discussed the limitations of evaluation and management by telemedicine and the availability of in person appointments. The patient expressed understanding and agreed to proceed with virtual visits throughout the duration of the program.   Location:  Patient: Patient Home Provider: [facility] [place] [facility]   History of Present Illness: MDD and Panic Disorder   Observations/Objective: Check In: Case Manager checked in with all participants to review discharge dates, insurance authorizations, work-related documents and needs from the treatment team regarding medications. Lindsey [name] stated needs and engaged in discussion.    Initial Therapeutic Activity: Counselor facilitated a check-in with Lindsey [name] to assess for safety, sobriety and medication compliance.  Counselor also inquired about [name]'s current emotional ratings, as well as any significant changes in thoughts, feelings or behavior since previous check in.  Lindsey [name] presented for session on time and was alert, oriented x5, with no evidence or self-report of active SI/HI or A/V H.  Lindsey [name] reported compliance with medication and denied use of alcohol or illicit substances.  Lindsey [name] reported scores of 7/10 for depression, 7/10 for anxiety, and 0/10 for anger/irritability.  Lindsey [name] denied any recent panic attacks.  Lindsey [name] reported that a recent success was ordering groceries yesterday and putting them up.  Lindsey [name] reported that a recent struggle was having an outburst yesterday when she was angry about someone asking her for money.  Lindsey [name] stated "It really hurt my feelings because I do want to establish some quality friendships".  Lindsey [name] reported that her goal today is to do some laundry and make a nice meal for  dinner.         Second Therapeutic Activity: Counselor introduced topic of grounding skills today.  Counselor defined these as simple strategies one can use to help detach from difficult thoughts or feelings temporarily by focusing on something else.  Counselor noted that grounding will not solve the problem at hand, but can provide the practitioner with time to regain control over their thoughts and/or feelings and prevent the situation from getting worse (i.e. interrupting a panic attack).  Counselor divided these into three categories (mental, physical, and soothing) and then provided examples of each which group members could practice during session.  Some of these included describing one's environment in detail or playing a categories game with oneself for mental category, taking a hot bath/shower, stretching, or carrying a grounding object for physical category, and saying kind statements, or visualizing people one cares about for soothing category.  Counselor inquired about which techniques members have used with success in the past, or will commit to learning, practicing, and applying now to improve coping abilities.  Intervention was effective, as evidenced by Lindsey [name] participating in discussion on the subject, trying out several of the techniques during session, and expressing interest in adding several to her available coping skills, such as playing a categories game involving listing [name] cities, describing an everyday activity in detail such as cooking or preparing her makeup routine, imagining herself being at the beach or near a waterfall, reading an interesting book or listening to an audio book, placing a warm washcloth on her face, touching various objects around her, carrying a fidget popper as a grounding object, doing a stretching routine, squeezing a pillow, using square breathing to  ground herself, or writing out a list of personal accomplishments.    Assessment and Plan: Counselor  recommends that Lindsey [name] remain in IOP treatment to better manage mental health symptoms, ensure stability and pursue completion of treatment plan goals. Counselor recommends adherence to crisis/safety plan, taking medications as prescribed, and following up with medical professionals if any issues arise.   Follow Up Instructions: Counselor will send Webex link for next session. Lindsey [name] was advised to call back or seek an in-person evaluation if the symptoms worsen or if the condition fails to improve as anticipated.   Collaboration of Care:   Medication Management AEB Ricky Ala, NP                                           Case Manager AEB Dellia Nims, CNA    Patient/Guardian was advised Release of Information must be obtained prior to any record release in order to collaborate their care with an outside provider. Patient/Guardian was advised if they have not already done so to contact the registration department to sign all necessary forms in order for [name] to release information regarding their care.   Consent: Patient/Guardian gives verbal consent for treatment and assignment of benefits for services provided during this visit. Patient/Guardian expressed understanding and agreed to proceed.  I provided 180 minutes of non-face-to-face time during this encounter.   Shade Flood, LCSW, LCAS 03/16/22

## 2022-03-17 ENCOUNTER — Other Ambulatory Visit: Payer: Self-pay | Admitting: Family

## 2022-03-17 ENCOUNTER — Other Ambulatory Visit (HOSPITAL_COMMUNITY): Payer: 59 | Admitting: Licensed Clinical Social Worker

## 2022-03-17 DIAGNOSIS — F332 Major depressive disorder, recurrent severe without psychotic features: Secondary | ICD-10-CM

## 2022-03-17 DIAGNOSIS — F4001 Agoraphobia with panic disorder: Secondary | ICD-10-CM

## 2022-03-17 NOTE — Progress Notes (Signed)
Virtual Visit via Video Note   I connected with [name] on 03/17/22 at  9:00 AM EDT by a video enabled telemedicine application and verified that I am speaking with the correct person using two identifiers.   At orientation to the IOP program, Case Manager discussed the limitations of evaluation and management by telemedicine and the availability of in person appointments. The patient expressed understanding and agreed to proceed with virtual visits throughout the duration of the program.   Location:  Patient: Patient Home Provider: [facility]   History of Present Illness: MDD and Panic Disorder   Observations/Objective: Check In: Case Manager checked in with all participants to review discharge dates, insurance authorizations, work-related documents and needs from the treatment team regarding medications. Ife stated needs and engaged in discussion.    Initial Therapeutic Activity: Counselor facilitated a check-in with [name] to assess for safety, sobriety and medication compliance.  Counselor also inquired about Elizabeth's current emotional ratings, as well as any significant changes in thoughts, feelings or behavior since previous check in.  [name] presented for session on time and was alert, oriented x5, with no evidence or self-report of active SI/HI or A/V H.  Adeli reported compliance with medication and denied use of alcohol or illicit substances.  Adamarys reported scores of 9/10 for depression, 6/10 for anxiety, and 0/10 for anger/irritability.  [name] denied any recent outbursts.  [name] reported that a recent success was doing some laundry, cooking a nice meal yesterday, and watching a movie with her friend.  [name] reported that a recent struggle was experiencing a panic attack this morning when she had a Sheriff knock on her door to give her a summons about her eviction.  Maty reported that her goal today is to work on desensitizing herself to going out of the home more  comfortably so she can attend her upcoming court date, noting that she was able to sit on the patio for an hour yesterday.      Second Therapeutic Activity: Counselor engaged the group in discussion on managing work/life balance today to improve mental health and wellness.  Counselor explained how finding balance between responsibilities at home and work place can be challenging, lead to increased stress, and this has been further complicated by recent pandemic leading to unemployment, more virtual work, and blurring of lines between home as a place of rest or work duties.  Counselor facilitated discussion on what challenges members have faced with this issue historically, as well as what, if any, issues have arisen following pandemic. Counselor also discussed strategies for improving work/life balance while members work on their mental health during treatment.  Some of these included keeping track of time management; creating a list of priorities and scaling importance; setting realistic, measurable goals each day; establishing boundaries; taking care of health needs; and nurturing relationships at home and work for support.  Counselor inquired about areas where members feel they are excelling, as well as areas they could focus on during treatment. Intervention was effective, as evidenced by [name] actively participating in discussion on topic and reporting that she has felt 'time poor' because of her current job, stating "Its like you spend all of your time at work, then only come home couple of hours.  The job was stressful, and management requirements were unrealistic, like you're never good enough, and there was always room for improvement".  [name] reported that she experienced several symptoms of burnout, including feeling tired and drained, insomnia, feeling overwhelmed, withdrawing from  responsibilities, taking excessive time off from work, isolation from others, and procrastination.  [name] reported that  there have also been numerous warning signs such as spending an inordinate amount of time thinking/worrying about work, experiencing panic attacks, irritable mood shifts, lacking energy, and not making herself available to others as a result.  Jahnay was receptive to suggestions offered today for addressing work life imbalance, including exploring new outlets for self-care, setting daily priorities to focus time and energy on, making an effort to leave work at work and avoid ruminating upon related tasks, learning to say "No" in order to set healthier work boundaries, investing more time in new friendships to expand support network, and committing to a healthier exercise/diet routine.      Assessment and Plan: Counselor recommends that [name] remain in IOP treatment to better manage mental health symptoms, ensure stability and pursue completion of treatment plan goals. Counselor recommends adherence to crisis/safety plan, taking medications as prescribed, and following up with medical professionals if any issues arise.   Follow Up Instructions: Counselor will send Webex link for next session. Pheobe was advised to call back or seek an in-person evaluation if the symptoms worsen or if the condition fails to improve as anticipated.   Collaboration of Care:   Medication Management AEB Hillery Jacks, NP                                           Case Manager AEB Jeri Modena, CNA    Patient/Guardian was advised Release of Information must be obtained prior to any record release in order to collaborate their care with an outside provider. Patient/Guardian was advised if they have not already done so to contact the registration department to sign all necessary forms in order for [name] to release information regarding their care.   Consent: Patient/Guardian gives verbal consent for treatment and assignment of benefits for services provided during this visit. Patient/Guardian expressed understanding and agreed to  proceed.  I provided 170 minutes of non-face-to-face time during this encounter.   Noralee Stain, LCSW, LCAS 03/17/22

## 2022-03-18 ENCOUNTER — Other Ambulatory Visit (HOSPITAL_COMMUNITY): Payer: 59 | Admitting: Licensed Clinical Social Worker

## 2022-03-18 DIAGNOSIS — F332 Major depressive disorder, recurrent severe without psychotic features: Secondary | ICD-10-CM | POA: Diagnosis not present

## 2022-03-18 DIAGNOSIS — F4001 Agoraphobia with panic disorder: Secondary | ICD-10-CM

## 2022-03-18 NOTE — Progress Notes (Signed)
Virtual Visit via Video Note   I connected with [name] on 03/18/22 at  9:00 AM EDT by a video enabled telemedicine application and verified that I am speaking with the correct person using two identifiers.   At orientation to the IOP program, Case Manager discussed the limitations of evaluation and management by telemedicine and the availability of in person appointments. The patient expressed understanding and agreed to proceed with virtual visits throughout the duration of the program.   Location:  Patient: Patient Home Provider: [facility]   History of Present Illness: MDD and Panic Disorder   Observations/Objective: Check In: Case Manager checked in with all participants to review discharge dates, insurance authorizations, work-related documents and needs from the treatment team regarding medications. Mayrin stated needs and engaged in discussion.    Initial Therapeutic Activity: Counselor facilitated a check-in with [name] to assess for safety, sobriety and medication compliance.  Counselor also inquired about Prajna's current emotional ratings, as well as any significant changes in thoughts, feelings or behavior since previous check in.  [name] presented for session on time and was alert, oriented x5, with no evidence or self-report of active SI/HI or A/V H.  Banita reported compliance with medication and denied use of alcohol or illicit substances.  Mannie reported scores of 4/10 for depression, 7/10 for anxiety, and 0/10 for anger/irritability.  [name] denied any recent outbursts or panic attacks.  [name] reported that a recent success was learning that she could buy a home from her grandmother if she can commit to paying off the remaining mortgage.  [name] denied any new struggles, stating "I feel like a burden has been lifted".  Taneisha reported that her goal this weekend is to spend time with her girlfriend and plan to start packing ahead of her upcoming move to a new  home.      Second Therapeutic Activity: Psycho-educational portion of group was provided by Virgina Evener, Interior and spatial designer of community education with [employer].  Alexandra provided information on history of her local agency, mission statement, and the variety of unique services offered which group members might find beneficial to engage in, including both virtual and in-person support groups, as well as peer support program for mentoring.  Alexandra offered time to answer member's questions regarding services and encouraged them to consider utilizing these services to assist in working towards their individual wellness goals.  Intervention was effective, as evidenced by [name] participating in discussion with speaker on the subject, and inquiring about whether she could attend some of the support groups of interest virtually due to her agoraphobia.  She reported that peer support could also be beneficial. Kaylean was appreciative of feedback from Ivins on virtual groups that could accommodate her current barrier.    Third Therapeutic Activity: Counselor introduced topic of building social support network today.  Counselor explained how this can be defined as having a having a group of healthy people in one's life you can talk to, spend time with, and get help from to improve both mental and physical health.  Counselor noted that some barriers can make it difficult to connect with other people, including the presence of anxiety or depression, or moving to an unfamiliar area.  Group members were asked to assess the current state of their support network, and identify ways that this could be improved.  Tips were given on how to address previously noted barriers, such as strengthening social skills, using relaxation techniques to reduce anxiety, scheduling social time each week,  and/or exploring social events nearby which could increase chances of meeting new supports.  Members were also encouraged to consider  getting closer to people they already know through suggestions such as outreaching someone by text, email or phone call if they haven't spoken in awhile, doing something nice for a friend/family member unexpectedly, and/or inviting someone over for a game/movie/dinner night.  Intervention was effective, as evidenced by [name] actively participating in discussion on the subject, and reporting that her support network has shrunk over the past year due to isolation from her agoraphobia.  [name] stated "Its hard to make friends when you're stuck inside the house.  I'm also not good at establishing platonic relationships because I'm slow to warm up to them".  [name] reported that she will often make plans with someone throughout the week, but cancel the day before, which has led them to label her "Sometimey [name]".  Farhiya expressed interest in some of the strategies offered for meeting new people, including reconnecting with old interest groups such as local ghost hunters, joining a Agricultural consultant group focused on animals, connecting with [employer], and finding a friend that has a dog so they can walk their animals together some days.       Assessment and Plan: Counselor recommends that [name] remain in IOP treatment to better manage mental health symptoms, ensure stability and pursue completion of treatment plan goals. Counselor recommends adherence to crisis/safety plan, taking medications as prescribed, and following up with medical professionals if any issues arise.   Follow Up Instructions: Counselor will send Webex link for next session. Wisdom was advised to call back or seek an in-person evaluation if the symptoms worsen or if the condition fails to improve as anticipated.   Collaboration of Care:   Medication Management AEB Hillery Jacks, NP                                           Case Manager AEB Jeri Modena, CNA    Patient/Guardian was advised Release of Information must be obtained prior to  any record release in order to collaborate their care with an outside provider. Patient/Guardian was advised if they have not already done so to contact the registration department to sign all necessary forms in order for [name] to release information regarding their care.   Consent: Patient/Guardian gives verbal consent for treatment and assignment of benefits for services provided during this visit. Patient/Guardian expressed understanding and agreed to proceed.  I provided 180 minutes of non-face-to-face time during this encounter.   Noralee Stain, LCSW, LCAS 03/18/22

## 2022-03-22 ENCOUNTER — Other Ambulatory Visit (HOSPITAL_COMMUNITY): Payer: 59 | Admitting: Licensed Clinical Social Worker

## 2022-03-22 DIAGNOSIS — F332 Major depressive disorder, recurrent severe without psychotic features: Secondary | ICD-10-CM

## 2022-03-22 DIAGNOSIS — F4001 Agoraphobia with panic disorder: Secondary | ICD-10-CM

## 2022-03-22 NOTE — Progress Notes (Signed)
Virtual Visit via Video Note   I connected with [name] on 03/22/22 at  9:00 AM EDT by a video enabled telemedicine application and verified that I am speaking with the correct person using two identifiers.   At orientation to the IOP program, Case Manager discussed the limitations of evaluation and management by telemedicine and the availability of in person appointments. The patient expressed understanding and agreed to proceed with virtual visits throughout the duration of the program.   Location:  Patient: Patient Home Provider: [facility]   History of Present Illness: MDD and Panic Disorder   Observations/Objective: Check In: Case Manager checked in with all participants to review discharge dates, insurance authorizations, work-related documents and needs from the treatment team regarding medications. Takila stated needs and engaged in discussion.    Initial Therapeutic Activity: Counselor facilitated a check-in with [name] to assess for safety, sobriety and medication compliance.  Counselor also inquired about Nyelah's current emotional ratings, as well as any significant changes in thoughts, feelings or behavior since previous check in.  [name] presented for session on time and was alert, oriented x5, with no evidence or self-report of active SI/HI or A/V H.  Dimond reported compliance with medication and denied use of alcohol or illicit substances.  [name] reported scores of 5/10 for depression, 7/10 for anxiety, and 0/10 for anger/irritability.  [name] denied any recent outbursts or panic attacks.  [name] reported that a recent success was trying to get out of the bed over the weekend to do some productive activity, including packing things ahead of upcoming move.   [name] reported that a recent struggle was experiencing passive SI yesterday, although she distracted herself by hugging her dogs and thinking about loved ones who care about her. Sukaina reported that her goal  today is to get outside of the house to check her mail and aid in further desensitization from agoraphobia.       Second Therapeutic Activity: Counselor introduced topic of assertive communication today.  Counselor shared various handouts with members virtually in group to read along with on the subject.  These handouts defined assertive communication as a communication style in which a person stands up for their own needs and wants, while also taking into consideration the needs and wants of others, without behaving in a passive or aggressive way.  Traits of assertive communicators were highlighted such as using appropriate speaking volume, maintaining eye contact, using confident language, and avoiding interruption.  Members were also provided with tips on how to improve communication, including respecting oneself, expressing thoughts and feelings calmly, and saying "No" when necessary.  Members were given a variety of scenarios where they could practice using these tips to respond in an assertive manner.  Intervention was effective, as evidenced by [name] participating in discussion on topic, reporting that she has a passive communication style due to traits such as being soft spoken, look down a lot during speech, having lack confidence, and not always expressing her wants or needs clearly to others.  [name] reported that this has led to issues such as having others take advantage of her leniency towards her porous boundaries, noting that her mother has done this for years up until she finally started to make it clear that this was not acceptable.  [name] stated "I consider myself a very docile person, but I'm definitely working on this".  [name] showed more effective use of assertive communication skills through engagement in roleplay activities today.    Assessment and  Plan: Counselor recommends that [name] remain in IOP treatment to better manage mental health symptoms, ensure stability and pursue  completion of treatment plan goals. Counselor recommends adherence to crisis/safety plan, taking medications as prescribed, and following up with medical professionals if any issues arise.   Follow Up Instructions: Counselor will send Webex link for next session. Britnee was advised to call back or seek an in-person evaluation if the symptoms worsen or if the condition fails to improve as anticipated.   Collaboration of Care:   Medication Management AEB Hillery Jacks, NP                                           Case Manager AEB Jeri Modena, CNA    Patient/Guardian was advised Release of Information must be obtained prior to any record release in order to collaborate their care with an outside provider. Patient/Guardian was advised if they have not already done so to contact the registration department to sign all necessary forms in order for [name] to release information regarding their care.   Consent: Patient/Guardian gives verbal consent for treatment and assignment of benefits for services provided during this visit. Patient/Guardian expressed understanding and agreed to proceed.  I provided 160 minutes of non-face-to-face time during this encounter.   Noralee Stain, LCSW, LCAS 03/22/22

## 2022-03-23 ENCOUNTER — Other Ambulatory Visit (HOSPITAL_BASED_OUTPATIENT_CLINIC_OR_DEPARTMENT_OTHER): Payer: 59 | Admitting: Licensed Clinical Social Worker

## 2022-03-23 DIAGNOSIS — F332 Major depressive disorder, recurrent severe without psychotic features: Secondary | ICD-10-CM | POA: Diagnosis not present

## 2022-03-23 DIAGNOSIS — F4001 Agoraphobia with panic disorder: Secondary | ICD-10-CM

## 2022-03-23 NOTE — Progress Notes (Signed)
Virtual Visit via Video Note   I connected with Lindsey [name] on 03/23/22 at 9:00am by video enabled telemedicine application and verified that I am speaking with the correct person using two identifiers.   I discussed the limitations, risks, security and privacy concerns of performing an evaluation and management service by video and the availability of in person appointments. I also discussed with the patient that there may be a patient responsible charge related to this service. The patient expressed understanding and agreed to proceed.   I discussed the assessment and treatment plan with the patient. The patient was provided an opportunity to ask questions and all were answered. The patient agreed with the plan and demonstrated an understanding of the instructions.   The patient was advised to call back or seek an in-person evaluation if the symptoms worsen or if the condition fails to improve as anticipated.   I provided 1 hour of non-face-to-face time during this encounter.     Shade Flood, LCSW, LCAS ________________________________ THERAPIST PROGRESS NOTE   Session Time: 9:00am - 10:00am     Location: Patient: Patient Home Provider: [facility]   Participation Level: Active   Behavioral Response: Alert, casually dressed, anxious mood/affect    Type of Therapy:  Individual Therapy   Treatment Goals addressed: Depression, Panic Attack, and Anxiety management; Medication Compliance  Progress Towards Goals: Progressing    Interventions: CBT: mindfulness, relaxation skills     Summary: Lindsey [name] is a 36 year old African American female that presented for therapy session today with diagnoses of Major Depressive Disorder, recurrent, severe and Panic Disorder with Agoraphobia.         Suicidal/Homicidal: None; without plan or intent.    Therapist Response: Clinician met with Lindsey [name] for individual therapy session, as group census was too low for [facility].  Clinician assessed  for safety, sobriety, and medication compliance.  Lindsey [name] presented for today's appointment on time and was alert, oriented x5, with no evidence or self-report of active SI/HI or A/V H.  Lindsey [name] reported ongoing compliance with medication and denied any use of alcohol or illicit substances.  Clinician inquired about Lindsey [name]'s current emotional ratings, as well as any significant changes in thoughts, feelings, or behavior since previous check-in.  Lindsey [name] reported scores of 5/10 for depression, 9/10 for anxiety, and 0/10 for anger/irritability. Lindsey [name] denied experiencing any recent outbursts.  Lindsey [name] reported that a recent success was trying to get out of her house yesterday in order to check the mailbox, although she was unable to accomplish this, and had a panic attack.  Lindsey [name] stated "I have to get that done today or tomorrow at the latest.  Maybe Ill try to go out early in the morning when no one is around".  Clinician proposed discussion on relaxation skills today to help Lindsey [name] prepare for this challenge.  Clinician invited her to practice mindful breathing meditation exercise today in session.  Lindsey [name] was agreeable to this suggestion, so clinician guided her through process of getting comfortable, achieving relaxing breathing rhythm, and then focusing upon maintaining this for 10 minutes while allowing distressing thoughts and feelings to be acknowledged, but then let go of.  Intervention was effective, as evidenced by Lindsey [name] actively participating in exercise, and reporting that she did feel calmer afterward, and more aware of where in her body stress tends to be compartmentalized, stating "I was able to pinpoint my anxiety to one specific part of my body where I feel it, usually its all over, but this  time I realized it was above my stomach.  My mind wandered a bit, but I do practice guided meditation and remembered to return to just my breathing".  Lindsey [name] reported that she noticed relief in areas of her body  such as the shoulders, and more confidence in ability to accomplish goal of getting outside of the house more comfortably, stating "I think I'll be okay if I try doing it before I check the mailbox".  Clinician will continue to monitor.       Plan: Follow up again in 1 day for group therapy.   Diagnosis: Major Depressive Disorder, recurrent, severe and Panic Disorder with Agoraphobia.  Collaboration of Care:   Medication Management AEB Ricky Ala, NP                                           Case Manager AEB Dellia Nims, CNA  Patient/Guardian was advised Release of Information must be obtained prior to any record release in order to collaborate their care with an outside provider. Patient/Guardian was advised if they have not already done so to contact the registration department to sign all necessary forms in order for [name] to release information regarding their care.    Consent: Patient/Guardian gives verbal consent for treatment and assignment of benefits for services provided during this visit. Patient/Guardian expressed understanding and agreed to proceed.   Shade Flood, [place], LCAS 03/23/22

## 2022-03-24 ENCOUNTER — Other Ambulatory Visit (HOSPITAL_COMMUNITY): Payer: 59 | Attending: Psychiatry | Admitting: Licensed Clinical Social Worker

## 2022-03-24 DIAGNOSIS — R45851 Suicidal ideations: Secondary | ICD-10-CM | POA: Diagnosis not present

## 2022-03-24 DIAGNOSIS — F4001 Agoraphobia with panic disorder: Secondary | ICD-10-CM

## 2022-03-24 DIAGNOSIS — F4310 Post-traumatic stress disorder, unspecified: Secondary | ICD-10-CM | POA: Insufficient documentation

## 2022-03-24 DIAGNOSIS — F332 Major depressive disorder, recurrent severe without psychotic features: Secondary | ICD-10-CM

## 2022-03-24 DIAGNOSIS — F410 Panic disorder [episodic paroxysmal anxiety] without agoraphobia: Secondary | ICD-10-CM | POA: Diagnosis present

## 2022-03-24 DIAGNOSIS — F32A Depression, unspecified: Secondary | ICD-10-CM | POA: Insufficient documentation

## 2022-03-24 NOTE — Progress Notes (Signed)
Virtual Visit via Video Note   I connected with Lindsey [name] on 03/24/22 at  9:00 AM EDT by a video enabled telemedicine application and verified that I am speaking with the correct person using two identifiers.   At orientation to the IOP program, Case Manager discussed the limitations of evaluation and management by telemedicine and the availability of in person appointments. The patient expressed understanding and agreed to proceed with virtual visits throughout the duration of the program.   Location:  Patient: Patient Home Provider: [facility]   History of Present Illness: MDD and Panic Disorder   Observations/Objective: Check In: Case Manager checked in with all participants to review discharge dates, insurance authorizations, work-related documents and needs from the treatment team regarding medications. Lindsey [name] stated needs and engaged in discussion.    Initial Therapeutic Activity: Counselor facilitated a check-in with Lindsey [name] to assess for safety, sobriety and medication compliance.  Counselor also inquired about Lindsey [name]'s current emotional ratings, as well as any significant changes in thoughts, feelings or behavior since previous check in.  Lindsey [name] presented for session on time and was alert, oriented x5, with no evidence or self-report of active SI/HI or A/V H.  Lindsey [name] reported compliance with medication and denied use of alcohol or illicit substances.  Lindsey [name] reported scores of 7/10 for depression, 9/10 for anxiety, and 6/10 for irritability.  Lindsey [name] denied any recent outbursts.  Lindsey [name] reported that a recent success was having a productive conversation with her mother yesterday, stating "It was a good day for [name]".  Lindsey [name] reported that a recent struggle was finding herself unable to go to the mailbox as she had planned for, which triggered a panic attack, although she hopes her mother can help her accomplish this goal before the end of the week.  Lindsey [name] reported that her primary goal  today is to make an appointment with her psychiatrist.      Second Therapeutic Activity: Counselor continued discussion upon topic of self-care today with group members.  Counselor virtually shared self-care assessment form with members via Webex to complete final sub-categories (i.e. social, spiritual, and professional).  Members were asked to rank their engagement in the activities listed for each dimension on a scale of 1-3, with 1 indicating 'Poor', 2 indicating 'Ok', and 3 indicating 'Well'.  Counselor invited members to share results of this assessment, and inquired about which areas of self-care they are doing well in, as well as areas that require attention, and how they plan to begin addressing this during treatment.  Intervention was effective, as evidenced by Lindsey [name] successfully completing final sections of assessment and actively engaging in discussion on subject, reporting that she is excelling in areas such as having stimulating conversations, recognizing things that give meaning to her life, appreciating art that is impactful to her, setting aside time for thought and reflection, acting in accordance with morals and values, improving professional skills, saying "No" to excessive work responsibilities, taking on projects that are interesting, learning things related to profession, and keeping a comfortable workspace, but would benefit from focusing more on areas such as spending time with people she likes, outreaching supports who live far away, keeping in touch with old friends, doing enjoyably activities with other people, meeting new people, asking for help when needed, meditating, praying, spending time in nature, participating in a cause that is important to her, advocating for fair pay and benefits at work, and making time to talk and build relationships in the work setting.  Lindsey [name] reported that  she would work to improve self-care deficits by making plans with people she likes to go fishing,  shopping, or play video games together, call old friends soon, adjusting boundaries in order to 'emotionally let people in' more easily to receive help when needed, building coping skills to get outside of her home more comfortably in order to make new connections, take time to meditate and pray each day in order to determine her life's purpose and figure out what makes her happy, find a cause/career to participate in that involves animal welfare, going out to the mountains or waterfalls in [place] once her agoraphobia is better managed, and recognizing self-worth by looking for promotions or other job opportunities that offer better pay/benefits.    Assessment and Plan: Counselor recommends that Lindsey [name] remain in IOP treatment to better manage mental health symptoms, ensure stability and pursue completion of treatment plan goals. Counselor recommends adherence to crisis/safety plan, taking medications as prescribed, and following up with medical professionals if any issues arise.   Follow Up Instructions: Counselor will send Webex link for next session. Lindsey [name] was advised to call back or seek an in-person evaluation if the symptoms worsen or if the condition fails to improve as anticipated.   Collaboration of Care:   Medication Management AEB Hillery Jacks, NP                                           Case Manager AEB Jeri Modena, CNA    Patient/Guardian was advised Release of Information must be obtained prior to any record release in order to collaborate their care with an outside provider. Patient/Guardian was advised if they have not already done so to contact the registration department to sign all necessary forms in order for [name] to release information regarding their care.   Consent: Patient/Guardian gives verbal consent for treatment and assignment of benefits for services provided during this visit. Patient/Guardian expressed understanding and agreed to proceed.  I provided 95 minutes of  non-face-to-face time during this encounter.   Noralee Stain, LCSW, LCAS 03/24/22

## 2022-03-25 ENCOUNTER — Other Ambulatory Visit (HOSPITAL_COMMUNITY): Payer: 59 | Admitting: Licensed Clinical Social Worker

## 2022-03-25 DIAGNOSIS — F32A Depression, unspecified: Secondary | ICD-10-CM | POA: Diagnosis not present

## 2022-03-25 DIAGNOSIS — F4001 Agoraphobia with panic disorder: Secondary | ICD-10-CM

## 2022-03-25 DIAGNOSIS — F332 Major depressive disorder, recurrent severe without psychotic features: Secondary | ICD-10-CM

## 2022-03-25 NOTE — Progress Notes (Signed)
Virtual Visit via Video Note   I connected with Lindsey [name] on 03/25/22 at  9:00 AM EDT by a video enabled telemedicine application and verified that I am speaking with the correct person using two identifiers.   At orientation to the IOP program, Case Manager discussed the limitations of evaluation and management by telemedicine and the availability of in person appointments. The patient expressed understanding and agreed to proceed with virtual visits throughout the duration of the program.   Location:  Patient: Patient Home Provider: [facility]   History of Present Illness: MDD and Panic Disorder   Observations/Objective: Check In: Case Manager checked in with all participants to review discharge dates, insurance authorizations, work-related documents and needs from the treatment team regarding medications. Lindsey [name] stated needs and engaged in discussion.    Initial Therapeutic Activity: Counselor facilitated a check-in with Lindsey [name] to assess for safety, sobriety and medication compliance.  Counselor also inquired about Lindsey [name]'s current emotional ratings, as well as any significant changes in thoughts, feelings or behavior since previous check in.  Lindsey [name] presented for session on time and was alert, oriented x5, with no evidence or self-report of active SI/HI or A/V H.  Lindsey [name] reported compliance with medication and denied use of alcohol or illicit substances.  Lindsey [name] reported scores of 8/10 for depression, 8/10 for anxiety, and 0/10 for anger/irritability.  Lindsey [name] denied any recent outbursts or panic attacks.  Lindsey [name] reported that a recent struggle was having her mother come over yesterday to help her get out of the house and travel to the bank, although she was unable to do this due to anxiety.  Lindsey [name] reported that her goal is to try to do this later in the weekend as she continues working on her coping skills.       Second Therapeutic Activity: Counselor introduced topic of self-esteem  today and defined this as the value an individual places on oneself, based upon assessment of personal worth as a human being and approval/disapproval of one's behavior. Counselor asked members to assess their level of self-esteem at this time based upon common indicators of high self-esteem, including: accepting oneself unconditionally; having self-respect and deep seated belief that one matters; being unaffected by other people's opinions/criticisms; and showing good control over emotions.  Counselor also explained concept of one's inner critic which serves to highlight faults and minimize strengths, directly influencing low sense of self-esteem.  Counselor then provided handout on 'strengths and qualities', which featured questions to guide discussion and increase awareness of each member's unique individual abilities which could reinforce higher self-esteem. Examples of questions included: 'things I am good at', 'challenges I have overcome', and 'what I like about myself'.  Intervention was effective, as evidenced by Lindsey [name] actively engaging in discussion on topic, and completing a self-esteem assessment, receiving a score of 13, which indicated a 'diminished' level of self-esteem at this time due to traits such as feeling unworthy of love or respect, avoiding new activities due to fear of failure, putting down her personal abilities, acting passively around others, constantly seeking approval from others, and being filled with fear and anxiety.  Lindsey [name] stated "Low self-esteem has always been a problem for me because I had a critical parent.  I think they don't trust me to do things on my own, and that makes me doubt myself in that way too.  I can always hear their voice and the things they would say when its time to accomplish goals.  I'm always overly critical of  myself and compare myself to others".  Lindsey [name] was receptive to several strategies offered today for increasing self-esteem during treatment,  including reciting positive affirmations to start off her morning, improving self-care routine to prioritize personal needs, expanding support network to include more positive people, cutting back on unhealthy comparison to others, recognizing and utilizing personal strengths such as artistic abilities, empathy, open mindedness, kindness, social awareness, self-control, patience, and humor.  Lindsey [name] stated "I need to take more risks and stop being so afraid of failure.  I also need to learn to be my own best friend".    Assessment and Plan: Counselor recommends that Lindsey [name] remain in IOP treatment to better manage mental health symptoms, ensure stability and pursue completion of treatment plan goals. Counselor recommends adherence to crisis/safety plan, taking medications as prescribed, and following up with medical professionals if any issues arise.   Follow Up Instructions: Counselor will send Webex link for next session. Lindsey [name] was advised to call back or seek an in-person evaluation if the symptoms worsen or if the condition fails to improve as anticipated.   Collaboration of Care:   Medication Management AEB Hillery Jacks, NP                                           Case Manager AEB Jeri Modena, CNA    Patient/Guardian was advised Release of Information must be obtained prior to any record release in order to collaborate their care with an outside provider. Patient/Guardian was advised if they have not already done so to contact the registration department to sign all necessary forms in order for [name] to release information regarding their care.   Consent: Patient/Guardian gives verbal consent for treatment and assignment of benefits for services provided during this visit. Patient/Guardian expressed understanding and agreed to proceed.  I provided 130 minutes of non-face-to-face time during this encounter.   Noralee Stain, LCSW, LCAS 03/25/22

## 2022-03-28 ENCOUNTER — Other Ambulatory Visit (HOSPITAL_COMMUNITY): Payer: 59 | Admitting: Licensed Clinical Social Worker

## 2022-03-28 DIAGNOSIS — F32A Depression, unspecified: Secondary | ICD-10-CM | POA: Diagnosis not present

## 2022-03-28 DIAGNOSIS — F332 Major depressive disorder, recurrent severe without psychotic features: Secondary | ICD-10-CM

## 2022-03-28 DIAGNOSIS — F4001 Agoraphobia with panic disorder: Secondary | ICD-10-CM

## 2022-03-28 MED ORDER — VENLAFAXINE HCL 75 MG PO TABS: ORAL_TABLET | ORAL | 0 refills | Status: DC

## 2022-03-28 MED ORDER — PROPRANOLOL HCL 10 MG PO TABS: 10.0000 mg | ORAL_TABLET | Freq: Every day | ORAL | 0 refills | Status: AC
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
## 2022-03-28 NOTE — Addendum Note (Signed)
Addended by: Oneta Rack on: 03/28/2022 02:33 PM   Modules accepted: Orders

## 2022-03-29 ENCOUNTER — Other Ambulatory Visit (HOSPITAL_COMMUNITY): Payer: 59 | Admitting: Psychiatry

## 2022-03-29 DIAGNOSIS — F332 Major depressive disorder, recurrent severe without psychotic features: Secondary | ICD-10-CM

## 2022-03-29 DIAGNOSIS — F4001 Agoraphobia with panic disorder: Secondary | ICD-10-CM

## 2022-03-29 DIAGNOSIS — F32A Depression, unspecified: Secondary | ICD-10-CM | POA: Diagnosis not present

## 2022-03-29 NOTE — Patient Instructions (Signed)
D:  Patient completed MH-IOP today.  A:  Discharge today.  Follow up with Dr. Toy Cookey Dimitriova and Janell Swaziland (therapist).  Patient states she will call both for appointments.  Strongly encouraged support groups through [facility] [phone].  R:  Patient receptive.

## 2022-03-29 NOTE — Progress Notes (Signed)
Virtual Visit via Video Note   I connected with Lindsey [name] on 03/29/22 at  9:00 AM EDT by a video enabled telemedicine application and verified that I am speaking with the correct person using two identifiers.   At orientation to the IOP program, Case Manager discussed the limitations of evaluation and management by telemedicine and the availability of in person appointments. The patient expressed understanding and agreed to proceed with virtual visits throughout the duration of the program.   Location:  Patient: Patient Home Provider: OPT BH Office   History of Present Illness: MDD and Panic Disorder   Observations/Objective: Check In: Case Manager checked in with all participants to review discharge dates, insurance authorizations, work-related documents and needs from the treatment team regarding medications. Lindsey [name] stated needs and engaged in discussion.    Initial Therapeutic Activity: Counselor facilitated a check-in with Lindsey [name] to assess for safety, sobriety and medication compliance.  Counselor also inquired about Lindsey [name]'s current emotional ratings, as well as any significant changes in thoughts, feelings or behavior since previous check in.  Lindsey [name] presented for session on time and was alert, oriented x5, with no evidence or self-report of active SI/HI or A/V H.  Lindsey [name] reported compliance with medication and denied use of alcohol or illicit substances.  Lindsey [name] reported scores of 5/10 for depression, 8/10 for anxiety, and 0/10 for anger/irritability.  Lindsey [name] denied any recent outbursts.  Lindsey [name] reported that a recent success was doing some packing yesterday as she prepares for her move to new home on Friday.  Lindsey [name] reported that a recent struggle was having a panic attack last night when she had a nightmare, and attributed this to withdrawals from Effexor, which she ran out of "A few days ago".  Lindsey [name] informed case Production designer, theatre/television/film and NP of this so that she can acquire a refill as soon possible.   Lindsey [name] reported that her goal today is to do some applications and more packing.    Second Therapeutic Activity: Counselor introduced [employer], [place] Chaplain to provide psychoeducation on topic of Grief and Loss with members today.  Lindsey [name] began discussion by checking in with the group about their baseline mood today, general thoughts on what grief means to them and how it has affected them personally in the past.  Lindsey [name] provided information on how the process of grief/loss can differ depending upon one's unique culture, and categories of loss one could experience (i.e. loss of a person, animal, relationship, job, identity, etc).  Lindsey [name] encouraged members to be mindful of how pervasive loss can be, and how to recognize signs which could indicate that this is having an impact on one's overall mental health and wellbeing.  Intervention was effective, as evidenced by Lindsey [name] participating in discussion with speaker on the subject, reporting that she attributes loss to "Something that you've lost and used to have", such as a relationship that ended in the past when she and a partner could no longer reconcile their differences.  Lindsey [name] reported that something which brings her joy and helps her cope with loss is reflecting on amusing childhood memories, such as the time her dog was trying to get her attention and barked in a manner that sounded like it was calling her name, stating "That Lindsey [name] was obsessed with me".     Third Therapeutic Activity: Counselor discussed topic of distress tolerance today with group.  Counselor shared virtual handout with members that offered a DBT approach represented by the acronym ACCEPTS, and outlined strategies for distracting oneself  from distressing emotions, allowing appropriate time for these emotions to lesson in intensity and eventually fade away.  Strategies offered included engaging in positive activities, contributing to the wellbeing of others, comparing  one's present situation to a previously difficult one to highlight resilience, using mental imagery, and physical grounding.  Counselor assisted members in creating their own realistic ACCEPTS plan for tackling a distressing emotion of their choice.  Intervention was effective, as evidenced by Lindsey [name] participating in creation of the plan, choosing anxiety as her emotion of focus, and identifying several helpful approaches for distraction, such as playing fetch with her dog, watching a funny 'TikTok' video or movie, drawing a portrait or landscape, donating time or goods to organizations with values she shares, comparing her struggles to others' in order to foster a sense of gratitude, reflecting on personal challenges she has been able to overcome in order to reinforce resilience, doing a guided meditation activity, call someone that makes her smile, practicing deep breathing, singing an uplifting song to herself, listing cities in alphabetical order, or petting her dogs.  Lindsey [name] stated "I think I have more ideas on how to calm down when I'm anxious".     Assessment and Plan: Lindsey [name] will be discharged from [facility] today.  She reported that she will followup with her care team in following days, and intends to continue with group therapy through [facility].  Counselor recommends adherence to crisis/safety plan, taking medications as prescribed, and following up with medical professionals if any issues arise.   Follow Up Instructions: Lindsey [name] was advised to call back or seek an in-person evaluation if the symptoms worsen or if the condition fails to improve as anticipated.   Collaboration of Care:   Medication Management AEB Hillery Jacks, NP                                           Case Manager AEB Jeri Modena, CNA    Patient/Guardian was advised Release of Information must be obtained prior to any record release in order to collaborate their care with an outside provider. Patient/Guardian was advised  if they have not already done so to contact the registration department to sign all necessary forms in order for [name] to release information regarding their care.   Consent: Patient/Guardian gives verbal consent for treatment and assignment of benefits for services provided during this visit. Patient/Guardian expressed understanding and agreed to proceed.  I provided 180 minutes of non-face-to-face time during this encounter.   Noralee Stain, LCSW, LCAS 03/29/22

## 2022-03-29 NOTE — Progress Notes (Addendum)
Virtual Visit via Video Note  I connected with Lindsey [name] on @TODAY @ at  9:00 AM EDT by a video enabled telemedicine application and verified that I am speaking with the correct person using two identifiers.  Location: Patient: at home Provider: at office   I discussed the limitations of evaluation and management by telemedicine and the availability of in person appointments. The patient expressed understanding and agreed to proceed.  I discussed the assessment and treatment plan with the patient. The patient was provided an opportunity to ask questions and all were answered. The patient agreed with the plan and demonstrated an understanding of the instructions.   The patient was advised to call back or seek an in-person evaluation if the symptoms worsen or if the condition fails to improve as anticipated.  I provided 20 minutes of non-face-to-face time during this encounter.   , M.Ed,CNA   Patient ID: Lindsey [name], female   DOB: Dec 18, 1985, 36 y.o.   MRN: [id number] As per patient's previous CCA states:  "Pt reports per d/c from hospital in [place], [place]. 1) Passive SI: "Things would be easier if I wasn't here." 2) "I am afraid to leave my home because I was homeless for a while. Something happened to me while I was homeless." Homeless in 2019-2020 Pt declines to go into what happened to her. 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 "I didn't have this problem prior to 2020." Pt reports she will take time off which she doesn't get paid for then she struggles to pay her bills. 4) People scare me: Men scare me. 5) Pt reports she did not leave her home for 3 years. Pt reports she could not even take her trash out. Pt reports her family came over and finally cleaned the trash out because the odor was so strong. Pt reports family came over last  year.  Pt reports treatment history includes therapy on/off. Pt reports hospitalization in 2006 due to suicide attempt by overdose; 2020 due to suicidal ideation with plan to shot self; 2023 due to attempt by overdose. Pt reports dx as agoraphobia, PTSD, MDD, and GAD. Pt reports continued passive SI; denies HI/AVH. Pt lives alone. Pt reports "no one" as supports. Medical diagnoses include diabetes; hypothyroidism; pseudotumor seribry; PCOS; high [name] pressure; high cholesterol."   Patient transitioned from [facility] to [facility] on 03-14-22.  Reports that the groups were helpful; but still unable to get out of her house.  Pt continues to struggle with financial issues.  Disability was approved, but she is two months behind on rent and not sure how she will pay it.  Admits to continued off and on passive SI; but denies today.  Denies HI or A/V hallucinations.  On a scale of 1-10 (10 being the worst); pt rates her depression at a 6 an anxiety at a 9.  Pt completed [facility] today.  Attended all eleven days as scheduled.  Reports feeling a "little" better.  Reports continuing to struggle with anxiety, depression an agoraphobia.  "I'm also still in bed all the time."  On a scale of 1-10 (10 being the worst); pt rates her anxiety at a 8 and depression at a 7.  Denies SI/HI or A/V hallucinations.  According to pt, she will be moving into her [facility] home this Friday and is looking forward to the move.  Pt  c/o not being able to get her anti-anxiety while being in [facility]-IOP.  "I haven't had it all this time; Alcario Drought said she didn't feel comfortable prescribing it to me.  I also didn't get all my Effexor."  Pt states she takes 150 mg in the morning and 75 mg at night; but she didn't get the get the 150 mg; only got the 75 mg refill.  According to medication list; it reads Take 1 tab (75 mg total) by mouth at bedtime and 2 tablets (150 mg total) daily. A:  D/C pt today.  F/U with Dr. Toy Cookey Dimitriova and Janell [place] (therapist).  Pt  states she will call both for a f/u appt.  Will inform Hillery Jacks, NP about Effexor refill.  Pt was advised of ROI must be obtained prior to any records release in order to collaborate her care with an outside provider.  Pt was advised if she has not already done so to contact the front desk to sign all necessary forms in order for [facility] to release info re: her care.  Consent:  Pt gives verbal consent for tx and assignment of benefits for services provided during this telehealth group process.  Pt expressed understanding and agreed to proceed. Collaboration of care:  Collaborate with Hillery Jacks, NP AEB and Noralee Stain, LCSW AEB, Dr. Toy Cookey Dimitriova AEB, and Janell [place], [facility], AEB.   R:  Patient receptive.   Jeri Modena, M.Ed,CNA

## 2022-03-30 ENCOUNTER — Ambulatory Visit (HOSPITAL_COMMUNITY): Payer: 59

## 2022-03-31 ENCOUNTER — Ambulatory Visit (HOSPITAL_COMMUNITY): Payer: 59

## 2022-04-01 ENCOUNTER — Ambulatory Visit (HOSPITAL_COMMUNITY): Payer: 59

## 2022-04-19 ENCOUNTER — Other Ambulatory Visit (HOSPITAL_COMMUNITY): Payer: Self-pay | Admitting: Family

## 2022-06-21 ENCOUNTER — Ambulatory Visit: Payer: 59 | Admitting: Neurology

## 2022-07-04 NOTE — Psych (Signed)
Virtual Visit via Video Note  I connected with Lindsey [name] on 02/22/22 at  9:00 AM EDT by a video enabled telemedicine application and verified that I am speaking with the correct person using two identifiers.  Location: Patient: patient home Provider: clinical home office   I discussed the limitations of evaluation and management by telemedicine and the availability of in person appointments. The patient expressed understanding and agreed to proceed.  I discussed the assessment and treatment plan with the patient. The patient was provided an opportunity to ask questions and all were answered. The patient agreed with the plan and demonstrated an understanding of the instructions.   The patient was advised to call back or seek an in-person evaluation if the symptoms worsen or if the condition fails to improve as anticipated.  Pt was provided 240 minutes of non-face-to-face time during this encounter.   Donia Guiles, LCSW   [facility] PHP THERAPIST PROGRESS NOTE  Felisha Claytor [id number]  Session Time: 9:00 - 10:00  Participation Level: Active  Behavioral Response: CasualAlertAnxious and Depressed  Type of Therapy: Group Therapy  Treatment Goals addressed: Coping  Progress Towards Goals: Progressing  Interventions: CBT, DBT, Supportive, and Reframing  Summary: Clinician led check-in regarding current stressors and situation, and review of patient completed daily inventory. Clinician utilized active listening and empathetic response and validated patient emotions. Clinician facilitated processing group on pertinent issues.?    Summary: Lindsey [name] is a 36 y.o. female who presents with anxiety and depression symptoms. Patient arrived within time allowed. Patient rates her mood at a 4 on a scale of 1-10 with 10 being best. Pt states she feels "anxious." Pt reports high anxiety regarding financial stress and the disability process. Pt reports low support for times when she is  stressed and/or as a safety net. Pt reports still recovering from laryngitis. Patient able to process. Patient engaged in discussion.        Session Time: 10:00 am - 11:00 am   Participation Level: Active   Behavioral Response: CasualAlertDepressed   Type of Therapy: Group Therapy   Treatment Goals addressed: Coping   Progress Towards Goals: Progressing   Interventions: CBT, DBT, Solution Focused, Strength-based, Supportive, and Reframing   Therapist Response: Cln led processing group for pt's current struggles. Group members shared stressors and provided support and feedback. Cln brought in topics of boundaries, healthy relationships, and unhealthy thought processes to inform discussion.    Therapist Response: Pt able to process and provide support to group.            Session Time: 11:00 -12:00   Participation Level: Active   Behavioral Response: CasualAlertDepressed   Type of Therapy: Group Therapy, Occupational Therapy   Treatment Goals addressed: Coping   Progress Towards Goals: Progressing   Interventions: Supportive, Education   Summary:  Occupational Therapy group   Therapist Response: See OT note.         Session Time: 12:00 -1:00   Participation Level: Active   Behavioral Response: CasualAlertDepressed   Type of Therapy: Group therapy   Treatment Goals addressed: Coping   Progress Towards Goals: Progressing   Interventions: CBT; Solution focused; Supportive; Reframing   Summary: 12:00 - 12:50: Cln introduced topic of vulnerability. Group viewed TED talk "the power of vulnerability" to aid discussion. Group members shared the relationship they have with vulnerability and how it impacts their relationships.  12:50 -1:00 Clinician led check-out. Clinician assessed for immediate needs, medication compliance and efficacy, and safety concerns.  Therapist Response: 12:00 - 12:50: Pt engaged in discussion and reports struggle with vulnerability.   12:50 - 1:00 pm: At check-out, patient reports no immediate concerns. Patient demonstrates progress as evidenced by increased openness. Patient denies SI/HI/self-harm thoughts at the end of group.    Suicidal/Homicidal: Nowithout intent/plan  Plan: Pt will continue in PHP while working to decrease depression and anxiety symptoms, increase ability to manage ADLs, and increase ability to manage symptoms in a healthy manner.  Collaboration of Care: Medication Management AEB T Lewis  Patient/Guardian was advised Release of Information must be obtained prior to any record release in order to collaborate their care with an outside provider. Patient/Guardian was advised if they have not already done so to contact the registration department to sign all necessary forms in order for [name] to release information regarding their care.   Consent: Patient/Guardian gives verbal consent for treatment and assignment of benefits for services provided during this visit. Patient/Guardian expressed understanding and agreed to proceed.   Diagnosis: Severe episode of recurrent major depressive disorder, without psychotic features (HCC) [F33.2]    1. Severe episode of recurrent major depressive disorder, without psychotic features (HCC)   2. Difficulty coping   3. Panic disorder with agoraphobia       Donia Guiles, LCSW

## 2022-07-04 NOTE — Psych (Signed)
Virtual Visit via Video Note  I connected with Lindsey [name] on 02/24/22 at  9:00 AM EDT by a video enabled telemedicine application and verified that I am speaking with the correct person using two identifiers.  Location: Patient: patient home Provider: clinical home office   I discussed the limitations of evaluation and management by telemedicine and the availability of in person appointments. The patient expressed understanding and agreed to proceed.  I discussed the assessment and treatment plan with the patient. The patient was provided an opportunity to ask questions and all were answered. The patient agreed with the plan and demonstrated an understanding of the instructions.   The patient was advised to call back or seek an in-person evaluation if the symptoms worsen or if the condition fails to improve as anticipated.  Pt was provided 240 minutes of non-face-to-face time during this encounter.   Donia Guiles, LCSW   [facility] PHP THERAPIST PROGRESS NOTE  Velicia Dejager [id number]  Session Time: 9:00 - 10:00  Participation Level: Active  Behavioral Response: CasualAlertAnxious and Depressed  Type of Therapy: Group Therapy  Treatment Goals addressed: Coping  Progress Towards Goals: Progressing  Interventions: CBT, DBT, Supportive, and Reframing  Summary: Clinician led check-in regarding current stressors and situation, and review of patient completed daily inventory. Clinician utilized active listening and empathetic response and validated patient emotions. Clinician facilitated processing group on pertinent issues.?    Summary: Lindsey [name] is a 36 y.o. female who presents with anxiety and depression symptoms. Patient arrived within time allowed. Patient rates her mood at a 8 on a scale of 1-10 with 10 being best. Pt states she feels "okay." Pt reports mixed feelings. Pt states she is excited for weekend because partner is coming to visit, however pt also states she  is "dreading" the weekend because her rent payment deadline will pass and she is unsure what will happen. Pt reports conflict with her mother and it affecting pt's mood. Patient able to process. Patient engaged in discussion.        Session Time: 10:00 am - 11:00 am   Participation Level: Active   Behavioral Response: CasualAlertDepressed   Type of Therapy: Group Therapy   Treatment Goals addressed: Coping   Progress Towards Goals: Progressing   Interventions: CBT, DBT, Solution Focused, Strength-based, Supportive, and Reframing   Therapist Response: Cln introduced topic of boundaries. Cln discussed how boundaries inform our relationships and affect self-esteem and personal agency. Group discussed the three types of boundaries: rigid, porous, and healthy and when each type is most helpful/harmful.    Therapist Response: Pt engaged in discussion and reports having mostly porous boundaries.             Session Time: 11:00 -12:00   Participation Level: Active   Behavioral Response: CasualAlertDepressed   Type of Therapy: Group Therapy, Occupational Therapy   Treatment Goals addressed: Coping   Progress Towards Goals: Progressing   Interventions: Supportive, Education   Summary:  Occupational Therapy group   Therapist Response: See OT note.         Session Time: 12:00 -1:00   Participation Level: Active   Behavioral Response: CasualAlertDepressed   Type of Therapy: Group therapy   Treatment Goals addressed: Coping   Progress Towards Goals: Progressing   Interventions: CBT; Solution focused; Supportive; Reframing   Summary: 12:00 - 12:50: Cln continued topic of positive self-esteem. Group members shared struggles with appearance and how that impacts their self-esteem. Cln encouraged pt's to consider what their body  does for them as a way to reframe physical self-esteem.  12:50 -1:00 Clinician led check-out. Clinician assessed for immediate needs, medication  compliance and efficacy, and safety concerns.   Therapist Response: 12:00 - 12:50: Pt engaged in discussion and shares struggles. Pt is able to state something her body does for her.  12:50 - 1:00 pm: At check-out, patient reports no immediate concerns. Patient demonstrates progress as evidenced by increasing positive events. Patient denies SI/HI/self-harm thoughts at the end of group.    Suicidal/Homicidal: Nowithout intent/plan  Plan: Pt will continue in PHP while working to decrease depression and anxiety symptoms, increase ability to manage ADLs, and increase ability to manage symptoms in a healthy manner.  Collaboration of Care: Medication Management AEB T Lewis  Patient/Guardian was advised Release of Information must be obtained prior to any record release in order to collaborate their care with an outside provider. Patient/Guardian was advised if they have not already done so to contact the registration department to sign all necessary forms in order for [name] to release information regarding their care.   Consent: Patient/Guardian gives verbal consent for treatment and assignment of benefits for services provided during this visit. Patient/Guardian expressed understanding and agreed to proceed.   Diagnosis: Severe episode of recurrent major depressive disorder, without psychotic features (HCC) [F33.2]    1. Severe episode of recurrent major depressive disorder, without psychotic features (HCC)   2. Panic disorder with agoraphobia       Donia Guiles, LCSW

## 2022-07-04 NOTE — Psych (Signed)
Virtual Visit via Video Note  I connected with Lindsey [name] on 02/23/22 at  9:00 AM EDT by a video enabled telemedicine application and verified that I am speaking with the correct person using two identifiers.  Location: Patient: patient home Provider: clinical home office   I discussed the limitations of evaluation and management by telemedicine and the availability of in person appointments. The patient expressed understanding and agreed to proceed.  I discussed the assessment and treatment plan with the patient. The patient was provided an opportunity to ask questions and all were answered. The patient agreed with the plan and demonstrated an understanding of the instructions.   The patient was advised to call back or seek an in-person evaluation if the symptoms worsen or if the condition fails to improve as anticipated.  Pt was provided 240 minutes of non-face-to-face time during this encounter.   Donia Guiles, LCSW   [facility] PHP THERAPIST PROGRESS NOTE  Lindsey [name] [id number]  Session Time: 9:00 - 10:00  Participation Level: Active  Behavioral Response: CasualAlertAnxious and Depressed  Type of Therapy: Group Therapy  Treatment Goals addressed: Coping  Progress Towards Goals: Progressing  Interventions: CBT, DBT, Supportive, and Reframing  Summary: Clinician led check-in regarding current stressors and situation, and review of patient completed daily inventory. Clinician utilized active listening and empathetic response and validated patient emotions. Clinician facilitated processing group on pertinent issues.?    Summary: Lindsey [name] is a 36 y.o. female who presents with anxiety and depression symptoms. Patient arrived within time allowed. Patient rates her mood at a 3 on a scale of 1-10 with 10 being best. Pt states she feels "stressed." Pt reports continued high anxiety regarding finances and her ability to pay rent. Pt is able to recognizing catastrophic  thoughts with help from group. Pt states she left the house to go check her mail and this was the first time in a long time that she has left the house. Pt states she had a panic attack completing the task, however did complete it.  Patient able to process. Patient engaged in discussion.         Session Time: 10:00 am - 11:00 am   Participation Level: Active   Behavioral Response: CasualAlertDepressed   Type of Therapy: Group Therapy   Treatment Goals addressed: Coping   Progress Towards Goals: Progressing   Interventions: CBT, DBT, Solution Focused, Strength-based, Supportive, and Reframing   Therapist Response: Cln introduced the "Thoughts" distraction skills from DBT distress tolerance skill ACCEPTS. Cln discussed how this set of distraction skills can be helpful when in situations where outside resources are limited or unavailable. Group practiced and brainstormed ways to apply the Thought skill.    Therapist Response: Pt engaged in discussion and is able to discuss ways to apply the skill in their own life.            Session Time: 11:00 -12:00   Participation Level: Active   Behavioral Response: CasualAlertDepressed   Type of Therapy: Group Therapy, Spiritual Care   Treatment Goals addressed: Coping   Progress Towards Goals: Progressing   Interventions: Supportive, Education   Summary:  Lindsey [name], Chaplain, led group.   Therapist Response: Pt participated       Session Time: 12:00 -1:00   Participation Level: Active   Behavioral Response: CasualAlertDepressed   Type of Therapy: Group Therapy, OT   Treatment Goals addressed: Coping   Progress Towards Goals: Progressing   Interventions: Supportive, Education   Summary:  OT,  Lindsey [name], led group. 12:50 -1:00 Clinician led check-out. Clinician assessed for immediate needs, medication compliance and efficacy, and safety concerns   Therapist Response: 12:00 - 12:50: See OT note  12:50 - 1:00 pm:  At check-out, patient reports no immediate concerns. Patient demonstrates progress as evidenced by completing a difficult task. Patient denies SI/HI/self-harm thoughts at the end of group.    Suicidal/Homicidal: Nowithout intent/plan  Plan: Pt will continue in PHP while working to decrease depression and anxiety symptoms, increase ability to manage ADLs, and increase ability to manage symptoms in a healthy manner.  Collaboration of Care: Medication Management AEB Lindsey [name]  Patient/Guardian was advised Release of Information must be obtained prior to any record release in order to collaborate their care with an outside provider. Patient/Guardian was advised if they have not already done so to contact the registration department to sign all necessary forms in order for [name] to release information regarding their care.   Consent: Patient/Guardian gives verbal consent for treatment and assignment of benefits for services provided during this visit. Patient/Guardian expressed understanding and agreed to proceed.   Diagnosis: Severe episode of recurrent major depressive disorder, without psychotic features (HCC) [F33.2]    1. Severe episode of recurrent major depressive disorder, without psychotic features (HCC)   2. Panic disorder with agoraphobia       Donia Guiles, LCSW

## 2022-07-04 NOTE — Psych (Signed)
Virtual Visit via Video Note  I connected with Lindsey [name] on 02/21/22 at  9:00 AM EDT by a video enabled telemedicine application and verified that I am speaking with the correct person using two identifiers.  Location: Patient: patient home Provider: clinical home office   I discussed the limitations of evaluation and management by telemedicine and the availability of in person appointments. The patient expressed understanding and agreed to proceed.  I discussed the assessment and treatment plan with the patient. The patient was provided an opportunity to ask questions and all were answered. The patient agreed with the plan and demonstrated an understanding of the instructions.   The patient was advised to call back or seek an in-person evaluation if the symptoms worsen or if the condition fails to improve as anticipated.  Pt was provided 240 minutes of non-face-to-face time during this encounter.   Donia Guiles, LCSW   [facility] PHP THERAPIST PROGRESS NOTE  Lindsey [name] [id number]  Session Time: 9:00 - 10:00  Participation Level: Active  Behavioral Response: CasualAlertAnxious and Depressed  Type of Therapy: Group Therapy  Treatment Goals addressed: Coping  Progress Towards Goals: Initial  Interventions: CBT, DBT, Supportive, and Reframing  Summary: Clinician led check-in regarding current stressors and situation, and review of patient completed daily inventory. Clinician utilized active listening and empathetic response and validated patient emotions. Clinician facilitated processing group on pertinent issues.?    Summary: Lindsey [name] is a 36 y.o. female who presents with anxiety and depression symptoms. Patient arrived within time allowed. Patient rates her mood at a 6 on a scale of 1-10 with 10 being best. Pt states she feels "anxious." Pt reports she has a cold which is negatively impacting her sleep and appetite and she has been nervous about starting group. Pt  reports difficulty with new people and has not left the house in three years. Patient able to process. Patient engaged in discussion.        Session Time: 10:00 am - 11:00 am   Participation Level: Active   Behavioral Response: CasualAlertDepressed   Type of Therapy: Group Therapy   Treatment Goals addressed: Coping   Progress Towards Goals: Progressing   Interventions: CBT, DBT, Solution Focused, Strength-based, Supportive, and Reframing   Therapist Response: Cln led processing group for pt's current struggles. Group members shared stressors and provided support and feedback. Cln brought in topics of boundaries, healthy relationships, and unhealthy thought processes to inform discussion.    Therapist Response: Pt able to process and provide support to group.            Session Time: 11:00 -12:00   Participation Level: Active   Behavioral Response: CasualAlertDepressed   Type of Therapy: Group Therapy, Occupational Therapy   Treatment Goals addressed: Coping   Progress Towards Goals: Progressing   Interventions: Supportive, Education   Summary:  Occupational Therapy group   Therapist Response: See OT note.         Session Time: 12:00 -1:00   Participation Level: Active   Behavioral Response: CasualAlertDepressed   Type of Therapy: Group therapy   Treatment Goals addressed: Coping   Progress Towards Goals: Progressing   Interventions: CBT; Solution focused; Supportive; Reframing   Summary: 12:00 - 12:50: Cln led discussion on protecting our energy. Cln brought in topics of boundaries and self-esteem. Group discussed what drains them and why they engage in those activities. Cln encouraged pt's to consider what will protect their energy when making decisions.  12:50 -1:00 Clinician led check-out.  Clinician assessed for immediate needs, medication compliance and efficacy, and safety concerns.   Therapist Response: 12:00 - 12:50: Pt engaged in discussion.   12:50 - 1:00 pm: At check-out, patient reports no immediate concerns. Patient demonstrates progress as evidenced by participating in first group session. Patient denies SI/HI/self-harm thoughts at the end of group.    Suicidal/Homicidal: Nowithout intent/plan  Plan: Pt will continue in PHP while working to decrease depression and anxiety symptoms, increase ability to manage ADLs, and increase ability to manage symptoms in a healthy manner.  Collaboration of Care: Medication Management AEB T Lewis  Patient/Guardian was advised Release of Information must be obtained prior to any record release in order to collaborate their care with an outside provider. Patient/Guardian was advised if they have not already done so to contact the registration department to sign all necessary forms in order for [name] to release information regarding their care.   Consent: Patient/Guardian gives verbal consent for treatment and assignment of benefits for services provided during this visit. Patient/Guardian expressed understanding and agreed to proceed.   Diagnosis: Severe episode of recurrent major depressive disorder, without psychotic features (HCC) [F33.2]    1. Severe episode of recurrent major depressive disorder, without psychotic features (HCC)       Donia Guiles, LCSW

## 2022-07-06 NOTE — Psych (Signed)
Virtual Visit via Video Note  I connected with Lindsey [name] on 03/01/22 at  9:00 AM EDT by a video enabled telemedicine application and verified that I am speaking with the correct person using two identifiers.  Location: Patient: patient home Provider: clinical home office   I discussed the limitations of evaluation and management by telemedicine and the availability of in person appointments. The patient expressed understanding and agreed to proceed.  I discussed the assessment and treatment plan with the patient. The patient was provided an opportunity to ask questions and all were answered. The patient agreed with the plan and demonstrated an understanding of the instructions.   The patient was advised to call back or seek an in-person evaluation if the symptoms worsen or if the condition fails to improve as anticipated.  Pt was provided 240 minutes of non-face-to-face time during this encounter.   Donia Guiles, LCSW   [facility] PHP THERAPIST PROGRESS NOTE  Lindsey [name] [id number]  Session Time: 9:00 - 10:00  Participation Level: Active  Behavioral Response: CasualAlertAnxious and Depressed  Type of Therapy: Group Therapy  Treatment Goals addressed: Coping  Progress Towards Goals: Progressing  Interventions: CBT, DBT, Supportive, and Reframing  Summary: Clinician led check-in regarding current stressors and situation, and review of patient completed daily inventory. Clinician utilized active listening and empathetic response and validated patient emotions. Clinician facilitated processing group on pertinent issues.?    Summary: Lindsey [name] is a 36 y.o. female who presents with anxiety and depression symptoms. Patient arrived within time allowed. Patient rates her mood at a 4 on a scale of 1-10 with 10 being best. Pt states she feels "not great." Pt states "my life is slowly falling apart." Pt reports continued stress regarding paying her bills. Pt reports ruminating  on her financial stress. Pt is aware of resources available to her for assistance.  Patient able to process. Patient engaged in discussion.        Session Time: 10:00 am - 11:00 am   Participation Level: Active   Behavioral Response: CasualAlertDepressed   Type of Therapy: Group Therapy   Treatment Goals addressed: Coping   Progress Towards Goals: Progressing   Interventions: CBT, DBT, Solution Focused, Strength-based, Supportive, and Reframing   Therapist Response: Cln led discussion on control and the way it impacts our lives. Group members shared struggles and worked to identify the way in which control is contributing to the struggle. Cln utilized CBT thought challenging and the Catch-Challenge-Change model to address their control issues.    Therapist Response: Pt engaged in discussion and is able to determine ways in which control is an issue for them and brainstormed how to address it in a healthy manner.            Session Time: 11:00 -12:00   Participation Level: Active   Behavioral Response: CasualAlertDepressed   Type of Therapy: Group Therapy, Occupational Therapy   Treatment Goals addressed: Coping   Progress Towards Goals: Progressing   Interventions: Supportive, Education   Summary:  Occupational Therapy group   Therapist Response: See OT note.         Session Time: 12:00 -1:00   Participation Level: Active   Behavioral Response: CasualAlertDepressed   Type of Therapy: Group therapy   Treatment Goals addressed: Coping   Progress Towards Goals: Progressing   Interventions: CBT; Solution focused; Supportive; Reframing   Summary: 12:00 - 12:50: Cln introduced "I" statements and the ways in which they can improve assertiveness and overall communication. Cln  educated group on "I" statement formula and led practice.  12:50 -1:00 Clinician led check-out. Clinician assessed for immediate needs, medication compliance and efficacy, and safety  concerns.   Therapist Response: 12:00 - 12:50: Pt engaged in discussion and demonstrates understanding of how to utilize "I" statements. 12:50 - 1:00 pm: At check-out, patient reports no immediate concerns. Patient demonstrates progress as evidenced by engaging in problem solving.. Patient denies SI/HI/self-harm thoughts at the end of group.    Suicidal/Homicidal: Nowithout intent/plan  Plan: Pt will continue in PHP while working to decrease depression and anxiety symptoms, increase ability to manage ADLs, and increase ability to manage symptoms in a healthy manner.  Collaboration of Care: Medication Management AEB T Lewis  Patient/Guardian was advised Release of Information must be obtained prior to any record release in order to collaborate their care with an outside provider. Patient/Guardian was advised if they have not already done so to contact the registration department to sign all necessary forms in order for [name] to release information regarding their care.   Consent: Patient/Guardian gives verbal consent for treatment and assignment of benefits for services provided during this visit. Patient/Guardian expressed understanding and agreed to proceed.   Diagnosis: Severe episode of recurrent major depressive disorder, without psychotic features (HCC) [F33.2]    1. Severe episode of recurrent major depressive disorder, without psychotic features (HCC)   2. Panic disorder with agoraphobia       Donia Guiles, LCSW

## 2022-07-20 NOTE — Psych (Signed)
Virtual Visit via Video Note  I connected with Lindsey [name] on 03/03/22 at  9:00 AM EDT by a video enabled telemedicine application and verified that I am speaking with the correct person using two identifiers.  Location: Patient: patient home Provider: clinical home office   I discussed the limitations of evaluation and management by telemedicine and the availability of in person appointments. The patient expressed understanding and agreed to proceed.  I discussed the assessment and treatment plan with the patient. The patient was provided an opportunity to ask questions and all were answered. The patient agreed with the plan and demonstrated an understanding of the instructions.   The patient was advised to call back or seek an in-person evaluation if the symptoms worsen or if the condition fails to improve as anticipated.  Pt was provided 240 minutes of non-face-to-face time during this encounter.   Lorin Glass, LCSW   [facility] PHP THERAPIST PROGRESS NOTE  Lindsey [name] [id number]  Session Time: 9:00 - 10:00  Participation Level: Active  Behavioral Response: CasualAlertAnxious and Depressed  Type of Therapy: Group Therapy  Treatment Goals addressed: Coping  Progress Towards Goals: Progressing  Interventions: CBT, DBT, Supportive, and Reframing  Summary: Clinician led check-in regarding current stressors and situation, and review of patient completed daily inventory. Clinician utilized active listening and empathetic response and validated patient emotions. Clinician facilitated processing group on pertinent issues.?    Summary: Lindsey [name] is a 36 y.o. female who presents with anxiety and depression symptoms. Patient arrived within time allowed. Patient rates her mood at a 3 on a scale of 1-10 with 10 being best. Pt states she feels "okay." Pt reports she "had a day yesterday." Pt reports difficult call with her mom that affected pt negatively. Pt states reaching out  to her partner who was supportive and later hearing from pt's brother who was also helpful. Pt reports feeling proud of herself for managing negativity better than in the past.  Pt identifies hopeless and passive SI yesterday and using support system to manage. Patient able to process. Patient engaged in discussion.        Session Time: 10:00 am - 11:00 am   Participation Level: Active   Behavioral Response: CasualAlertDepressed   Type of Therapy: Group Therapy   Treatment Goals addressed: Coping   Progress Towards Goals: Progressing   Interventions: CBT, DBT, Solution Focused, Strength-based, Supportive, and Reframing   Therapist Response: Cln led discussion on valuing ourselves in the same way, or more than we value others. Group viewed TED talk "The Person You Really Need to Marry" to facilitate discussion. Group discussed what it would be like to view themselves as they do a romantic partner and treat themselves with as much care.    Therapist Response: Pt engaged in discussion and is able to share struggles with valuing themselves.         Session Time: 11:00 -12:00   Participation Level: Active   Behavioral Response: CasualAlertDepressed   Type of Therapy: Group Therapy, Occupational Therapy   Treatment Goals addressed: Coping   Progress Towards Goals: Progressing   Interventions: Supportive, Education   Summary:  Occupational Therapy group led by cln E. Hollan.   Therapist Response: See OT note         Session Time: 12:00 -1:00   Participation Level: Active   Behavioral Response: CasualAlertDepressed   Type of Therapy: Group therapy   Treatment Goals addressed: Coping   Progress Towards Goals: Progressing   Interventions: CBT;  Solution focused; Supportive; Reframing   Summary: 12:00 - 12:50: Cln led discussion on upcoming holiday, Mother's Day, and group shared how this upcoming holiday is affecting them. Cln brought in DBT distress tolerance skills,  CBT reframing, and supportive themes to shape discussion. 12:50 -1:00 Clinician led check-out. Clinician assessed for immediate needs, medication compliance and efficacy, and safety concerns.   Therapist Response: 12:00 - 12:50: Pt able to process upcoming holiday. 12:50 - 1:00 pm: At check-out, patient reports no immediate concerns. Patient demonstrates progress as evidenced by increased healthy management of symptoms. Patient denies SI/HI/self-harm thoughts at the end of group.    Suicidal/Homicidal: Nowithout intent/plan  Plan: Pt will continue in PHP while working to decrease depression and anxiety symptoms, increase ability to manage ADLs, and increase ability to manage symptoms in a healthy manner.  Collaboration of Care: Medication Management AEB T Lewis  Patient/Guardian was advised Release of Information must be obtained prior to any record release in order to collaborate their care with an outside provider. Patient/Guardian was advised if they have not already done so to contact the registration department to sign all necessary forms in order for [name] to release information regarding their care.   Consent: Patient/Guardian gives verbal consent for treatment and assignment of benefits for services provided during this visit. Patient/Guardian expressed understanding and agreed to proceed.   Diagnosis: Severe episode of recurrent major depressive disorder, without psychotic features ([place]) [F33.2]    1. Severe episode of recurrent major depressive disorder, without psychotic features ([place])   2. Panic disorder with agoraphobia       Lorin Glass, LCSW

## 2022-07-20 NOTE — Psych (Signed)
Virtual Visit via Video Note  I connected with Lindsey Lindsey [name] on 03/02/22 at  9:00 AM EDT by a video enabled telemedicine application and verified that I am speaking with the correct person using two identifiers.  Location: Patient: patient home Provider: clinical home office   I discussed the limitations of evaluation and management by telemedicine and the availability of in person appointments. The patient expressed understanding and agreed to proceed.  I discussed the assessment and treatment plan with the patient. The patient was provided an opportunity to ask questions and all were answered. The patient agreed with the plan and demonstrated an understanding of the instructions.   The patient was advised to call back or seek an in-person evaluation if the symptoms worsen or if the condition fails to improve as anticipated.  Pt was provided 240 minutes of non-face-to-face time during this encounter.   Lindsey Glass, LCSW   [name] PHP THERAPIST PROGRESS NOTE  Lindsey Krenek [id number]  Session Time: 9:00 - 10:00  Participation Level: Active  Behavioral Response: CasualAlertAnxious and Depressed  Type of Therapy: Group Therapy  Treatment Goals addressed: Coping  Progress Towards Goals: Progressing  Interventions: CBT, DBT, Supportive, and Reframing  Summary: Clinician led check-in regarding current stressors and situation, and review of patient completed daily inventory. Clinician utilized active listening and empathetic response and validated patient emotions. Clinician facilitated processing group on pertinent issues.?    Summary: Lindsey Lindsey [name] is a 36 y.o. female who presents with anxiety and depression symptoms. Patient arrived within time allowed. Patient rates her mood at a 1 on a scale of 1-10 with 10 being best. Pt states she feels "rough." Pt reports she received an eviction notice and a repossession notice yesterday. Pt states she heard from short term disability  and her leave was approved, but the money coming in won't be enough to cover expenses. Pt is aware of resources available to her for assistance. Pt reports she reached out to her apartment complex and her car company to see what her next steps are. Pt identifies hopeless and passive SI yesterday and that thinking about her dogs helped her manage them. Patient able to process. Patient engaged in discussion.        Session Time: 10:00 am - 11:00 am   Participation Level: Active   Behavioral Response: CasualAlertDepressed   Type of Therapy: Group Therapy   Treatment Goals addressed: Coping   Progress Towards Goals: Progressing   Interventions: CBT, DBT, Solution Focused, Strength-based, Supportive, and Reframing   Therapist Response: Cln led processing group for pt's current struggles. Group members shared stressors and provided support and feedback. Cln brought in topics of boundaries, healthy relationships, and unhealthy thought processes to inform discussion.   Therapist Response: Pt able to process and provide support to group.            Session Time: 11:00 -12:00   Participation Level: Active   Behavioral Response: CasualAlertDepressed   Type of Therapy: Group Therapy, Spiritual Care   Treatment Goals addressed: Coping   Progress Towards Goals: Progressing   Interventions: Supportive, Education   Summary:  Lindsey Lindsey [name], Chaplain, led group.   Therapist Response: Pt participated        Session Time: 12:00 -1:00   Participation Level: Active   Behavioral Response: CasualAlertDepressed   Type of Therapy: Group Therapy, OT   Treatment Goals addressed: Coping   Progress Towards Goals: Progressing   Interventions: Supportive, Education   Summary:  OT, Lindsey Lindsey [name], led group.  12:50 -1:00 Clinician led check-out. Clinician assessed for immediate needs, medication compliance and efficacy, and safety concerns   Therapist Response: 12:00 - 12:50: See OT note   12:50 - 1:00 pm: At check-out, patient reports no immediate concerns. Patient demonstrates progress as evidenced by goal directed behaviors. Patient denies SI/HI/self-harm thoughts at the end of group.    Suicidal/Homicidal: Nowithout intent/plan  Plan: Pt will continue in PHP while working to decrease depression and anxiety symptoms, increase ability to manage ADLs, and increase ability to manage symptoms in a healthy manner.  Collaboration of Care: Medication Management AEB T Lewis  Patient/Guardian was advised Release of Information must be obtained prior to any record release in order to collaborate their care with an outside provider. Patient/Guardian was advised if they have not already done so to contact the registration department to sign all necessary forms in order for [name] to release information regarding their care.   Consent: Patient/Guardian gives verbal consent for treatment and assignment of benefits for services provided during this visit. Patient/Guardian expressed understanding and agreed to proceed.   Diagnosis: Severe episode of recurrent major depressive disorder, without psychotic features ([place]) [F33.2]    1. Severe episode of recurrent major depressive disorder, without psychotic features ([place])   2. Panic disorder with agoraphobia       Lindsey Glass, LCSW

## 2022-07-20 NOTE — Psych (Signed)
Virtual Visit via Video Note  I connected with Lindsey [name] on 03/08/22 at  9:00 AM EDT by a video enabled telemedicine application and verified that I am speaking with the correct person using two identifiers.  Location: Patient: patient home Provider: clinical home office   I discussed the limitations of evaluation and management by telemedicine and the availability of in person appointments. The patient expressed understanding and agreed to proceed.  I discussed the assessment and treatment plan with the patient. The patient was provided an opportunity to ask questions and all were answered. The patient agreed with the plan and demonstrated an understanding of the instructions.   The patient was advised to call back or seek an in-person evaluation if the symptoms worsen or if the condition fails to improve as anticipated.  Pt was provided 240 minutes of non-face-to-face time during this encounter.   Lorin Glass, LCSW   [facility] PHP THERAPIST PROGRESS NOTE  Lindsey [name] [id number]  Session Time: 9:00 - 10:00  Participation Level: Active  Behavioral Response: CasualAlertAnxious and Depressed  Type of Therapy: Group Therapy  Treatment Goals addressed: Coping  Progress Towards Goals: Progressing  Interventions: CBT, DBT, Supportive, and Reframing  Summary: Clinician led check-in regarding current stressors and situation, and review of patient completed daily inventory. Clinician utilized active listening and empathetic response and validated patient emotions. Clinician facilitated processing group on pertinent issues.?    Summary: Lindsey [name] is a 36 y.o. female who presents with anxiety and depression symptoms. Patient arrived within time allowed. Patient rates her mood at a 8 on a scale of 1-10 with 10 being best. Pt states she feels "excited." Pt reports she is not feeling anxious this morning and it is the first time in a long time this has happened. Pt states  yesterday was "really good" and she spent time with her partner and stayed distracted. Pt reports "I slept like a baby" and denies SI. Patient able to process. Patient engaged in discussion.          Session Time: 10:00 am - 11:00 am   Participation Level: Active   Behavioral Response: CasualAlertDepressed   Type of Therapy: Group Therapy   Treatment Goals addressed: Coping   Progress Towards Goals: Progressing   Interventions: CBT, DBT, Solution Focused, Strength-based, Supportive, and Reframing   Therapist Response: Cln led discussion on anger and the way it impacts [name]. Cln utilized CBT cognitive distortion: emotional reasoning to inform discussion. Cln encouraged pt's to view anger as a warning from our bodies that something is not right. Cln utilized the anger iceberg to discuss the ways in which anger may be masking other feelings that are more difficult to express. Group discussed how they experience anger and what issues it has caused.    Therapist Response: Pt engaged in discussion and reports understanding.           Session Time: 11:00 -12:00   Participation Level: Active   Behavioral Response: CasualAlertDepressed   Type of Therapy: Group Therapy, Occupational Therapy   Treatment Goals addressed: Coping   Progress Towards Goals: Progressing   Interventions: Supportive, Education   Summary:  Occupational Therapy group led by cln E. Hollan.   Therapist Response: See OT note         Session Time: 12:00 -1:00   Participation Level: Active   Behavioral Response: CasualAlertDepressed   Type of Therapy: Group therapy   Treatment Goals addressed: Coping   Progress Towards Goals: Progressing   Interventions: CBT;  Solution focused; Supportive; Reframing   Summary: 12:00 - 12:50: Cln led discussion on healthy aggression substitutes. Cln discussed the benefits to discharging energy and adrenaline when feeling "revved up" in emotion and the importance of  balancing that discharge with safety and lack if negative consequences. Group brainstormed different ways to channel aggression in a healthy way and shared ways that have worked for them in the past. 12:50 -1:00 Clinician led check-out. Clinician assessed for immediate needs, medication compliance and efficacy, and safety concerns.   Therapist Response: 12:00 - 12:50: Pt engaged in discussion and identifies 3 options to try.  12:50 - 1:00 pm: At check-out, patient reports no immediate concerns. Patient demonstrates progress as evidenced by no SI. Patient denies SI/HI/self-harm thoughts at the end of group.    Suicidal/Homicidal: Nowithout intent/plan  Plan: Pt will continue in PHP while working to decrease depression and anxiety symptoms, increase ability to manage ADLs, and increase ability to manage symptoms in a healthy manner.  Collaboration of Care: Medication Management AEB T Lewis  Patient/Guardian was advised Release of Information must be obtained prior to any record release in order to collaborate their care with an outside provider. Patient/Guardian was advised if they have not already done so to contact the registration department to sign all necessary forms in order for [name] to release information regarding their care.   Consent: Patient/Guardian gives verbal consent for treatment and assignment of benefits for services provided during this visit. Patient/Guardian expressed understanding and agreed to proceed.   Diagnosis: Severe episode of recurrent major depressive disorder, without psychotic features ([facility]) [F33.2]    1. Severe episode of recurrent major depressive disorder, without psychotic features (HCC)   2. Panic disorder with agoraphobia       Donia Guiles, LCSW

## 2022-07-20 NOTE — Psych (Signed)
Virtual Visit via Video [name]  I connected with Lindsey [name] on 03/09/22 at  9:00 AM EDT by a video enabled telemedicine application and verified that I am speaking with the correct person using two identifiers.  Location: Patient: patient home Provider: clinical home office   I discussed the limitations of evaluation and management by telemedicine and the availability of in person appointments. The patient expressed understanding and agreed to proceed.  I discussed the assessment and treatment plan with the patient. The patient was provided an opportunity to ask questions and all were answered. The patient agreed with the plan and demonstrated an understanding of the instructions.   The patient was advised to call back or seek an in-person evaluation if the symptoms worsen or if the condition fails to improve as anticipated.  Pt was provided 240 minutes of non-face-to-face time during this encounter.   Lindsey Guiles, LCSW   Lindsey [name]  Lindsey [name] [id number]  Session Time: 9:00 - 10:00  Participation Level: Active  Behavioral Response: CasualAlertAnxious and Depressed  Type of Therapy: Group Therapy  Treatment Goals addressed: Coping  Progress Towards Goals: Progressing  Interventions: CBT, DBT, Supportive, and Reframing  Summary: Clinician led check-in regarding current stressors and situation, and review of patient completed daily inventory. Clinician utilized active listening and empathetic response and validated patient emotions. Clinician facilitated processing group on pertinent issues.?    Summary: Lindsey [name] is a 36 y.o. female who presents with anxiety and depression symptoms. Patient arrived within time allowed. Patient rates her mood at a 6 on a scale of 1-10 with 10 being best. Pt states feeling "okay." Pt reports that yesterday her dog ran out of the house and pt had to chase him down. Pt reports struggling with panic at leaving  the house, but her dog's safety motivated her. Pt reports difficulty coming down from the experience and it impacted her sleep negatively. Pt states anxiety regarding her partner wanting pt to leave the house this weekend. Patient able to process. Patient engaged in discussion.        Session Time: 10:00 am - 11:00 am   Participation Level: Active   Behavioral Response: CasualAlertDepressed   Type of Therapy: Group Therapy   Treatment Goals addressed: Coping   Progress Towards Goals: Progressing   Interventions: CBT, DBT, Solution Focused, Strength-based, Supportive, and Reframing   Therapist Response: Cln led processing group for pt's current struggles. Group members shared stressors and provided support and feedback. Cln brought in topics of boundaries, healthy relationships, and unhealthy thought processes to inform discussion.   Therapist Response: Pt able to process and provide support to group.            Session Time: 11:00 -12:00   Participation Level: Active   Behavioral Response: CasualAlertDepressed   Type of Therapy: Group Therapy, Spiritual Care   Treatment Goals addressed: Coping   Progress Towards Goals: Progressing   Interventions: Supportive, Education   Summary:  Lindsey [name], Chaplain, led group.   Therapist Response: Pt participated        Session Time: 12:00 -1:00   Participation Level: Active   Behavioral Response: CasualAlertDepressed   Type of Therapy: Group Therapy, OT   Treatment Goals addressed: Coping   Progress Towards Goals: Progressing   Interventions: Supportive, Education   Summary:  OT, Lindsey [name], led group. 12:50 -1:00 Clinician led check-out. Clinician assessed for immediate needs, medication compliance and efficacy, and safety concerns   Therapist Response: 12:00 -  12:50: See OT [name]  12:50 - 1:00 pm: At check-out, patient reports no immediate concerns. Patient demonstrates progress as evidenced by leaving the  house. Patient denies SI/HI/self-harm thoughts at the end of group.    Suicidal/Homicidal: Nowithout intent/plan  Plan: Pt will continue in PHP while working to decrease depression and anxiety symptoms, increase ability to manage ADLs, and increase ability to manage symptoms in a healthy manner.  Collaboration of Care: Medication Management AEB Lindsey [name]  Patient/Guardian was advised Release of Information must be obtained prior to any record release in order to collaborate their care with an outside provider. Patient/Guardian was advised if they have not already done so to contact the registration department to sign all necessary forms in order for [name] to release information regarding their care.   Consent: Patient/Guardian gives verbal consent for treatment and assignment of benefits for services provided during this visit. Patient/Guardian expressed understanding and agreed to proceed.   Diagnosis: Severe episode of recurrent major depressive disorder, without psychotic features ([place]) [F33.2]    1. Severe episode of recurrent major depressive disorder, without psychotic features ([place])   2. Difficulty coping   3. Panic disorder with agoraphobia       Lindsey Glass, LCSW

## 2022-07-20 NOTE — Psych (Signed)
Virtual Visit via Video Note  I connected with Cornelia Copa on 03/07/22 at  9:00 AM EDT by a video enabled telemedicine application and verified that I am speaking with the correct person using two identifiers.  Location: Patient: patient home Provider: clinical home office   I discussed the limitations of evaluation and management by telemedicine and the availability of in person appointments. The patient expressed understanding and agreed to proceed.  I discussed the assessment and treatment plan with the patient. The patient was provided an opportunity to ask questions and all were answered. The patient agreed with the plan and demonstrated an understanding of the instructions.   The patient was advised to call back or seek an in-person evaluation if the symptoms worsen or if the condition fails to improve as anticipated.  Pt was provided 240 minutes of non-face-to-face time during this encounter.   Lorin Glass, LCSW   [facility] PHP THERAPIST PROGRESS NOTE  Gianah Batt [id number]  Session Time: 9:00 - 10:00  Participation Level: Active  Behavioral Response: CasualAlertAnxious and Depressed  Type of Therapy: Group Therapy  Treatment Goals addressed: Coping  Progress Towards Goals: Progressing  Interventions: CBT, DBT, Supportive, and Reframing  Summary: Clinician led check-in regarding current stressors and situation, and review of patient completed daily inventory. Clinician utilized active listening and empathetic response and validated patient emotions. Clinician facilitated processing group on pertinent issues.?    Summary: Lindsey [name] is a 36 y.o. female who presents with anxiety and depression symptoms. Patient arrived within time allowed. Patient rates her mood at a 5 on a scale of 1-10 with 10 being best. Pt states she feels "okay." Pt reports difficulties with her mom this weekend. Pt reports mom said "I hate you" and was mean to pt on Saturday and then  acted like "everything was fine" on Sunday. Pt states her partner visited on Sunday and pt attempted to focus on their time together. Pt identifies hopelessness and passive SI on Saturday and using support from her dogs to manage. Patient able to process. Patient engaged in discussion.          Session Time: 10:00 am - 11:00 am   Participation Level: Active   Behavioral Response: CasualAlertDepressed   Type of Therapy: Group Therapy   Treatment Goals addressed: Coping   Progress Towards Goals: Progressing   Interventions: CBT, DBT, Solution Focused, Strength-based, Supportive, and Reframing   Therapist Response: Cln led discussion on increasing support. Group members shared what supports they have and where it is lacking. Cln encouraged ways to increase adult friendships including accepting invitations, joining clubs/activities, reaching out, and support groups.    Therapist Response: Pt engaged in discussion and is able to determine ways to increase their support.           Session Time: 11:00 -12:00   Participation Level: Active   Behavioral Response: CasualAlertDepressed   Type of Therapy: Group Therapy, Occupational Therapy   Treatment Goals addressed: Coping   Progress Towards Goals: Progressing   Interventions: Supportive, Education   Summary:  Occupational Therapy group led by cln E. Hollan.   Therapist Response: See OT note         Session Time: 12:00 -1:00   Participation Level: Active   Behavioral Response: CasualAlertDepressed   Type of Therapy: Group therapy   Treatment Goals addressed: Coping   Progress Towards Goals: Progressing   Interventions: CBT; Solution focused; Supportive; Reframing   Summary: 12:00 - 12:50: Cln led discussion on ways to  reinforce new habits. Group shared ways in which they can set themselves up for good habits. Cln encouraged reminders in phone, post-it notes, educating support system, and practicing in low-stress  environments.  12:50 -1:00 Clinician led check-out. Clinician assessed for immediate needs, medication compliance and efficacy, and safety concerns.   Therapist Response: 12:00 - 12:50: Pt engaged in discussion and is able to determine ways to reinforce positive habits.  12:50 - 1:00 pm: At check-out, patient reports no immediate concerns. Patient demonstrates progress as evidenced by increased ability to redirect negative thinking. Patient denies SI/HI/self-harm thoughts at the end of group.    Suicidal/Homicidal: Nowithout intent/plan  Plan: Pt will continue in PHP while working to decrease depression and anxiety symptoms, increase ability to manage ADLs, and increase ability to manage symptoms in a healthy manner.  Collaboration of Care: Medication Management AEB T Lewis  Patient/Guardian was advised Release of Information must be obtained prior to any record release in order to collaborate their care with an outside provider. Patient/Guardian was advised if they have not already done so to contact the registration department to sign all necessary forms in order for [name] to release information regarding their care.   Consent: Patient/Guardian gives verbal consent for treatment and assignment of benefits for services provided during this visit. Patient/Guardian expressed understanding and agreed to proceed.   Diagnosis: Severe episode of recurrent major depressive disorder, without psychotic features ([place]) [F33.2]    1. Severe episode of recurrent major depressive disorder, without psychotic features ([place])   2. Panic disorder with agoraphobia       Lorin Glass, LCSW

## 2022-07-20 NOTE — Psych (Signed)
Virtual Visit via Video Note  I connected with Cornelia Copa on 03/11/22 at  9:00 AM EDT by a video enabled telemedicine application and verified that I am speaking with the correct person using two identifiers.  Location: Patient: patient home Provider: clinical home office   I discussed the limitations of evaluation and management by telemedicine and the availability of in person appointments. The patient expressed understanding and agreed to proceed.  I discussed the assessment and treatment plan with the patient. The patient was provided an opportunity to ask questions and all were answered. The patient agreed with the plan and demonstrated an understanding of the instructions.   The patient was advised to call back or seek an in-person evaluation if the symptoms worsen or if the condition fails to improve as anticipated.  Pt was provided 240 minutes of non-face-to-face time during this encounter.   Lorin Glass, LCSW   [facility] PHP THERAPIST PROGRESS NOTE  Bana Borgmeyer [id number]  Session Time: 9:00 - 10:00  Participation Level: Active  Behavioral Response: CasualAlertAnxious and Depressed  Type of Therapy: Group Therapy  Treatment Goals addressed: Coping  Progress Towards Goals: Progressing  Interventions: CBT, DBT, Supportive, and Reframing  Summary: Clinician led check-in regarding current stressors and situation, and review of patient completed daily inventory. Clinician utilized active listening and empathetic response and validated patient emotions. Clinician facilitated processing group on pertinent issues.?    Summary: Judia Arnott is a 36 y.o. female who presents with anxiety and depression symptoms. Patient arrived within time allowed. Patient rates her mood at a 5 on a scale of 1-10 with 10 being best. Pt states feeling "pretty good." Pt reports having a "chill" day yesterday and feeling overall less anxiety for her. Pt reports it spikes with her mom who  is pushing boundaries, and the idea of going out of the house tonight. Patient able to process. Patient engaged in discussion.        Session Time: 10:00 am - 11:00 am   Participation Level: Active   Behavioral Response: CasualAlertDepressed   Type of Therapy: Group Therapy   Treatment Goals addressed: Coping   Progress Towards Goals: Progressing   Interventions: CBT, DBT, Solution Focused, Strength-based, Supportive, and Reframing   Therapist Response: Cln continued topic of DBT distress tolerance skills and the ACCEPTS distraction skill. Group reviewed P-T-S skills and discussed how they can practice them in their every day life.    Therapist Response: Pt engaged in discussion and is able to brainstorm ways to apply the skills.          Session Time: 11:00 -12:00   Participation Level: Active   Behavioral Response: CasualAlertDepressed   Type of Therapy: Group Therapy, Occupational Therapy   Treatment Goals addressed: Coping   Progress Towards Goals: Progressing   Interventions: Supportive, Education   Summary:  Occupational Therapy group led by cln E. Hollan.   Therapist Response: See OT note         Session Time: 12:00 -1:00   Participation Level: Active   Behavioral Response: CasualAlertDepressed   Type of Therapy: Group therapy   Treatment Goals addressed: Coping   Progress Towards Goals: Progressing   Interventions: CBT; Solution focused; Supportive; Reframing   Summary: 12:00 - 12:50: Cln led discussion on ways to manage stressors and feelings over the weekend. Group members  brainstormed things to do over the weekend for multiple levels of energy, access, and moods. Cln reviewed crisis services should they be needed and provided pt's with  the text crisis line, mobile crisis, national suicide hotline, [facility] 24/7 line, and information on [facility].    12:50 -1:00 Clinician led check-out. Clinician assessed for immediate needs, medication  compliance and efficacy, and safety concerns.   Therapist Response: 12:00 - 12:50: Pt engaged in discussion and is able to identify 3 ideas of what to do over the weekend to keep their mind engaged.  12:50 - 1:00 pm: At check-out, patient reports no immediate concerns. Patient demonstrates progress as evidenced by increased ability to manage moods. Patient denies SI/HI/self-harm thoughts at the end of group.    Suicidal/Homicidal: Nowithout intent/plan  Plan: Pt will discharge from PHP due to meeting treatment goals of decreased depression and anxiety symptoms, increased ability to manage ADLs, and increased ability to manage symptoms in a healthy manner. Pt will step down to IOP within this agency beginning 5/22 to continue treatment. Pt and provider are aligned with discharge. Pt denies SI/HI at time of discharge.  Collaboration of Care: Medication Management AEB T Lewis  Patient/Guardian was advised Release of Information must be obtained prior to any record release in order to collaborate their care with an outside provider. Patient/Guardian was advised if they have not already done so to contact the registration department to sign all necessary forms in order for [name] to release information regarding their care.   Consent: Patient/Guardian gives verbal consent for treatment and assignment of benefits for services provided during this visit. Patient/Guardian expressed understanding and agreed to proceed.   Diagnosis: Severe episode of recurrent major depressive disorder, without psychotic features (HCC) [F33.2]    1. Severe episode of recurrent major depressive disorder, without psychotic features (HCC)   2. Panic disorder with agoraphobia       Donia Guiles, LCSW

## 2022-07-20 NOTE — Psych (Signed)
Virtual Visit via Video Note  I connected with Lindsey [name] on 03/10/22 at  9:00 AM EDT by a video enabled telemedicine application and verified that I am speaking with the correct person using two identifiers.  Location: Patient: patient home Provider: clinical home office   I discussed the limitations of evaluation and management by telemedicine and the availability of in person appointments. The patient expressed understanding and agreed to proceed.  I discussed the assessment and treatment plan with the patient. The patient was provided an opportunity to ask questions and all were answered. The patient agreed with the plan and demonstrated an understanding of the instructions.   The patient was advised to call back or seek an in-person evaluation if the symptoms worsen or if the condition fails to improve as anticipated.  Pt was provided 240 minutes of non-face-to-face time during this encounter.   Lorin Glass, LCSW   [facility] PHP THERAPIST PROGRESS NOTE  Lindsey [name] [id number]  Session Time: 9:00 - 10:00  Participation Level: Active  Behavioral Response: CasualAlertAnxious and Depressed  Type of Therapy: Group Therapy  Treatment Goals addressed: Coping  Progress Towards Goals: Progressing  Interventions: CBT, DBT, Supportive, and Reframing  Summary: Clinician led check-in regarding current stressors and situation, and review of patient completed daily inventory. Clinician utilized active listening and empathetic response and validated patient emotions. Clinician facilitated processing group on pertinent issues.?    Summary: Lindsey [name] is a 36 y.o. female who presents with anxiety and depression symptoms. Patient arrived within time allowed. Patient rates her mood at a 7 on a scale of 1-10 with 10 being best. Pt states feeling "okay." Pt reports yesterday was "chill." Pt reports there was a fraud attempt on her debit card and she was proud of the way she acted  and managed her anxiety during the situation. Pt notes setting boundaries with her mom seems to be helping her anxiety. Pt reports increased anxiety about leaving the house now that her car is back. Patient able to process. Patient engaged in discussion.        Session Time: 10:00 am - 11:00 am   Participation Level: Active   Behavioral Response: CasualAlertDepressed   Type of Therapy: Group Therapy   Treatment Goals addressed: Coping   Progress Towards Goals: Progressing   Interventions: CBT, DBT, Solution Focused, Strength-based, Supportive, and Reframing   Therapist Response: Cln introduced topic of DBT distress tolerance skills. Cln provided context for distress tolerance skills and how to practice them. Cln introduced the ACCEPTS distraction skills and group discusses ways to utilize "A" activities.    Therapist Response: Pt engaged in discussion and states ways to practice the "A" skill.          Session Time: 11:00 -12:00   Participation Level: Active   Behavioral Response: CasualAlertDepressed   Type of Therapy: Group Therapy, Occupational Therapy   Treatment Goals addressed: Coping   Progress Towards Goals: Progressing   Interventions: Supportive, Education   Summary:  Occupational Therapy group led by cln E. Hollan.   Therapist Response: See OT note         Session Time: 12:00 -1:00   Participation Level: Active   Behavioral Response: CasualAlertDepressed   Type of Therapy: Group therapy   Treatment Goals addressed: Coping   Progress Towards Goals: Progressing   Interventions: CBT; Solution focused; Supportive; Reframing   Summary: 12:00 - 12:50: Cln continued topic of DBT distress tolerance skills and the ACCEPTS distraction skill. Group reviewed C-C-E skills  and discussed how they can practice them in their every day life.  12:50 -1:00 Clinician led check-out. Clinician assessed for immediate needs, medication compliance and efficacy, and  safety concerns.   Therapist Response: 12:00 - 12:50: Pt engaged in discussion and reports she is most likely to practice the "E" skill.  12:50 - 1:00 pm: At check-out, patient reports no immediate concerns. Patient demonstrates progress as evidenced by improving mood. Patient denies SI/HI/self-harm thoughts at the end of group.    Suicidal/Homicidal: Nowithout intent/plan  Plan: Pt will continue in PHP while working to decrease depression and anxiety symptoms, increase ability to manage ADLs, and increase ability to manage symptoms in a healthy manner.  Collaboration of Care: Medication Management AEB T Lewis  Patient/Guardian was advised Release of Information must be obtained prior to any record release in order to collaborate their care with an outside provider. Patient/Guardian was advised if they have not already done so to contact the registration department to sign all necessary forms in order for [name] to release information regarding their care.   Consent: Patient/Guardian gives verbal consent for treatment and assignment of benefits for services provided during this visit. Patient/Guardian expressed understanding and agreed to proceed.   Diagnosis: Severe episode of recurrent major depressive disorder, without psychotic features ([place]) [F33.2]    1. Severe episode of recurrent major depressive disorder, without psychotic features ([place])   2. Panic disorder with agoraphobia       Lorin Glass, LCSW

## 2022-11-01 ENCOUNTER — Emergency Department (HOSPITAL_COMMUNITY): Payer: Medicaid Other | Admitting: Anesthesiology

## 2022-11-01 ENCOUNTER — Emergency Department (HOSPITAL_BASED_OUTPATIENT_CLINIC_OR_DEPARTMENT_OTHER): Payer: Medicaid Other

## 2022-11-01 ENCOUNTER — Other Ambulatory Visit: Payer: Self-pay

## 2022-11-01 ENCOUNTER — Encounter (HOSPITAL_BASED_OUTPATIENT_CLINIC_OR_DEPARTMENT_OTHER): Payer: Self-pay

## 2022-11-01 ENCOUNTER — Encounter (HOSPITAL_COMMUNITY): Admission: EM | Disposition: E | Payer: Self-pay | Source: Home / Self Care | Attending: Internal Medicine

## 2022-11-01 ENCOUNTER — Inpatient Hospital Stay (HOSPITAL_BASED_OUTPATIENT_CLINIC_OR_DEPARTMENT_OTHER)
Admission: EM | Admit: 2022-11-01 | Discharge: 2022-12-23 | DRG: 853 | Disposition: E | Payer: Medicaid Other | Attending: Family Medicine | Admitting: Family Medicine

## 2022-11-01 DIAGNOSIS — I471 Supraventricular tachycardia, unspecified: Secondary | ICD-10-CM | POA: Diagnosis not present

## 2022-11-01 DIAGNOSIS — J189 Pneumonia, unspecified organism: Secondary | ICD-10-CM | POA: Diagnosis not present

## 2022-11-01 DIAGNOSIS — R21 Rash and other nonspecific skin eruption: Secondary | ICD-10-CM | POA: Diagnosis present

## 2022-11-01 DIAGNOSIS — J810 Acute pulmonary edema: Secondary | ICD-10-CM | POA: Diagnosis not present

## 2022-11-01 DIAGNOSIS — E781 Pure hyperglyceridemia: Secondary | ICD-10-CM | POA: Diagnosis present

## 2022-11-01 DIAGNOSIS — J9601 Acute respiratory failure with hypoxia: Secondary | ICD-10-CM | POA: Diagnosis present

## 2022-11-01 DIAGNOSIS — R6521 Severe sepsis with septic shock: Secondary | ICD-10-CM | POA: Diagnosis present

## 2022-11-01 DIAGNOSIS — L0231 Cutaneous abscess of buttock: Secondary | ICD-10-CM | POA: Diagnosis present

## 2022-11-01 DIAGNOSIS — E039 Hypothyroidism, unspecified: Secondary | ICD-10-CM | POA: Diagnosis present

## 2022-11-01 DIAGNOSIS — E1165 Type 2 diabetes mellitus with hyperglycemia: Secondary | ICD-10-CM

## 2022-11-01 DIAGNOSIS — F419 Anxiety disorder, unspecified: Secondary | ICD-10-CM | POA: Diagnosis present

## 2022-11-01 DIAGNOSIS — E1110 Type 2 diabetes mellitus with ketoacidosis without coma: Secondary | ICD-10-CM | POA: Diagnosis present

## 2022-11-01 DIAGNOSIS — F4310 Post-traumatic stress disorder, unspecified: Secondary | ICD-10-CM | POA: Diagnosis present

## 2022-11-01 DIAGNOSIS — R278 Other lack of coordination: Secondary | ICD-10-CM | POA: Diagnosis not present

## 2022-11-01 DIAGNOSIS — Z6841 Body Mass Index (BMI) 40.0 and over, adult: Secondary | ICD-10-CM | POA: Diagnosis not present

## 2022-11-01 DIAGNOSIS — R6520 Severe sepsis without septic shock: Secondary | ICD-10-CM | POA: Diagnosis not present

## 2022-11-01 DIAGNOSIS — Z87891 Personal history of nicotine dependence: Secondary | ICD-10-CM

## 2022-11-01 DIAGNOSIS — Z1152 Encounter for screening for COVID-19: Secondary | ICD-10-CM

## 2022-11-01 DIAGNOSIS — J9811 Atelectasis: Secondary | ICD-10-CM | POA: Diagnosis present

## 2022-11-01 DIAGNOSIS — Z597 Insufficient social insurance and welfare support: Secondary | ICD-10-CM

## 2022-11-01 DIAGNOSIS — N179 Acute kidney failure, unspecified: Secondary | ICD-10-CM | POA: Diagnosis present

## 2022-11-01 DIAGNOSIS — I10 Essential (primary) hypertension: Secondary | ICD-10-CM

## 2022-11-01 DIAGNOSIS — F324 Major depressive disorder, single episode, in partial remission: Secondary | ICD-10-CM | POA: Diagnosis present

## 2022-11-01 DIAGNOSIS — Z833 Family history of diabetes mellitus: Secondary | ICD-10-CM

## 2022-11-01 DIAGNOSIS — E1169 Type 2 diabetes mellitus with other specified complication: Secondary | ICD-10-CM | POA: Diagnosis present

## 2022-11-01 DIAGNOSIS — G928 Other toxic encephalopathy: Secondary | ICD-10-CM | POA: Diagnosis not present

## 2022-11-01 DIAGNOSIS — E871 Hypo-osmolality and hyponatremia: Secondary | ICD-10-CM | POA: Diagnosis present

## 2022-11-01 DIAGNOSIS — Z9911 Dependence on respirator [ventilator] status: Secondary | ICD-10-CM

## 2022-11-01 DIAGNOSIS — I469 Cardiac arrest, cause unspecified: Secondary | ICD-10-CM | POA: Diagnosis not present

## 2022-11-01 DIAGNOSIS — E870 Hyperosmolality and hypernatremia: Secondary | ICD-10-CM | POA: Diagnosis present

## 2022-11-01 DIAGNOSIS — B951 Streptococcus, group B, as the cause of diseases classified elsewhere: Secondary | ICD-10-CM | POA: Diagnosis present

## 2022-11-01 DIAGNOSIS — I4891 Unspecified atrial fibrillation: Secondary | ICD-10-CM

## 2022-11-01 DIAGNOSIS — I119 Hypertensive heart disease without heart failure: Secondary | ICD-10-CM | POA: Diagnosis present

## 2022-11-01 DIAGNOSIS — E1100 Type 2 diabetes mellitus with hyperosmolarity without nonketotic hyperglycemic-hyperosmolar coma (NKHHC): Secondary | ICD-10-CM | POA: Diagnosis present

## 2022-11-01 DIAGNOSIS — F4001 Agoraphobia with panic disorder: Secondary | ICD-10-CM | POA: Diagnosis present

## 2022-11-01 DIAGNOSIS — E874 Mixed disorder of acid-base balance: Secondary | ICD-10-CM | POA: Diagnosis present

## 2022-11-01 DIAGNOSIS — J9612 Chronic respiratory failure with hypercapnia: Secondary | ICD-10-CM | POA: Diagnosis present

## 2022-11-01 DIAGNOSIS — D62 Acute posthemorrhagic anemia: Secondary | ICD-10-CM | POA: Diagnosis not present

## 2022-11-01 DIAGNOSIS — F32A Depression, unspecified: Secondary | ICD-10-CM | POA: Diagnosis present

## 2022-11-01 DIAGNOSIS — T402X5A Adverse effect of other opioids, initial encounter: Secondary | ICD-10-CM | POA: Diagnosis not present

## 2022-11-01 DIAGNOSIS — Z5329 Procedure and treatment not carried out because of patient's decision for other reasons: Secondary | ICD-10-CM | POA: Diagnosis present

## 2022-11-01 DIAGNOSIS — R197 Diarrhea, unspecified: Secondary | ICD-10-CM | POA: Diagnosis not present

## 2022-11-01 DIAGNOSIS — J9600 Acute respiratory failure, unspecified whether with hypoxia or hypercapnia: Secondary | ICD-10-CM | POA: Diagnosis not present

## 2022-11-01 DIAGNOSIS — R001 Bradycardia, unspecified: Secondary | ICD-10-CM | POA: Diagnosis not present

## 2022-11-01 DIAGNOSIS — R092 Respiratory arrest: Secondary | ICD-10-CM | POA: Diagnosis not present

## 2022-11-01 DIAGNOSIS — Z79890 Hormone replacement therapy (postmenopausal): Secondary | ICD-10-CM

## 2022-11-01 DIAGNOSIS — E8770 Fluid overload, unspecified: Secondary | ICD-10-CM | POA: Diagnosis not present

## 2022-11-01 DIAGNOSIS — Y92239 Unspecified place in hospital as the place of occurrence of the external cause: Secondary | ICD-10-CM | POA: Diagnosis not present

## 2022-11-01 DIAGNOSIS — G932 Benign intracranial hypertension: Secondary | ICD-10-CM | POA: Diagnosis present

## 2022-11-01 DIAGNOSIS — M726 Necrotizing fasciitis: Secondary | ICD-10-CM

## 2022-11-01 DIAGNOSIS — B954 Other streptococcus as the cause of diseases classified elsewhere: Secondary | ICD-10-CM | POA: Diagnosis present

## 2022-11-01 DIAGNOSIS — I480 Paroxysmal atrial fibrillation: Secondary | ICD-10-CM | POA: Diagnosis present

## 2022-11-01 DIAGNOSIS — E878 Other disorders of electrolyte and fluid balance, not elsewhere classified: Secondary | ICD-10-CM | POA: Diagnosis not present

## 2022-11-01 DIAGNOSIS — E11649 Type 2 diabetes mellitus with hypoglycemia without coma: Secondary | ICD-10-CM | POA: Diagnosis not present

## 2022-11-01 DIAGNOSIS — G9340 Encephalopathy, unspecified: Secondary | ICD-10-CM | POA: Diagnosis not present

## 2022-11-01 DIAGNOSIS — Z803 Family history of malignant neoplasm of breast: Secondary | ICD-10-CM

## 2022-11-01 DIAGNOSIS — G9341 Metabolic encephalopathy: Secondary | ICD-10-CM | POA: Diagnosis not present

## 2022-11-01 DIAGNOSIS — E1152 Type 2 diabetes mellitus with diabetic peripheral angiopathy with gangrene: Secondary | ICD-10-CM | POA: Diagnosis present

## 2022-11-01 DIAGNOSIS — Z888 Allergy status to other drugs, medicaments and biological substances status: Secondary | ICD-10-CM

## 2022-11-01 DIAGNOSIS — R0609 Other forms of dyspnea: Secondary | ICD-10-CM | POA: Diagnosis not present

## 2022-11-01 DIAGNOSIS — N7682 Fournier disease of vagina and vulva: Secondary | ICD-10-CM | POA: Diagnosis present

## 2022-11-01 DIAGNOSIS — R739 Hyperglycemia, unspecified: Secondary | ICD-10-CM | POA: Diagnosis not present

## 2022-11-01 DIAGNOSIS — Z841 Family history of disorders of kidney and ureter: Secondary | ICD-10-CM

## 2022-11-01 DIAGNOSIS — Z8249 Family history of ischemic heart disease and other diseases of the circulatory system: Secondary | ICD-10-CM

## 2022-11-01 DIAGNOSIS — F418 Other specified anxiety disorders: Secondary | ICD-10-CM | POA: Diagnosis not present

## 2022-11-01 DIAGNOSIS — E876 Hypokalemia: Secondary | ICD-10-CM | POA: Diagnosis not present

## 2022-11-01 DIAGNOSIS — I959 Hypotension, unspecified: Secondary | ICD-10-CM | POA: Diagnosis present

## 2022-11-01 DIAGNOSIS — K611 Rectal abscess: Secondary | ICD-10-CM | POA: Diagnosis present

## 2022-11-01 DIAGNOSIS — E282 Polycystic ovarian syndrome: Secondary | ICD-10-CM | POA: Diagnosis present

## 2022-11-01 DIAGNOSIS — D638 Anemia in other chronic diseases classified elsewhere: Secondary | ICD-10-CM | POA: Diagnosis not present

## 2022-11-01 DIAGNOSIS — E7800 Pure hypercholesterolemia, unspecified: Secondary | ICD-10-CM | POA: Diagnosis present

## 2022-11-01 DIAGNOSIS — Z79899 Other long term (current) drug therapy: Secondary | ICD-10-CM

## 2022-11-01 DIAGNOSIS — A419 Sepsis, unspecified organism: Principal | ICD-10-CM | POA: Diagnosis present

## 2022-11-01 HISTORY — PX: IRRIGATION AND DEBRIDEMENT BUTTOCKS: SHX6601

## 2022-11-01 LAB — LACTIC ACID, PLASMA
Lactic Acid, Venous: 3.5 mmol/L (ref 0.5–1.9)
Lactic Acid, Venous: 2.9 mmol/L (ref 0.5–1.9)

## 2022-11-01 LAB — I-STAT VENOUS BLOOD GAS, ED
Acid-base deficit: 10 mmol/L — ABNORMAL HIGH (ref 0.0–2.0)
Acid-base deficit: 10 mmol/L — ABNORMAL HIGH (ref 0.0–2.0)
Bicarbonate: 15.4 mmol/L — ABNORMAL LOW (ref 20.0–28.0)
Bicarbonate: 15.9 mmol/L — ABNORMAL LOW (ref 20.0–28.0)
Calcium, Ion: 1.1 mmol/L — ABNORMAL LOW (ref 1.15–1.40)
Calcium, Ion: 1.08 mmol/L — ABNORMAL LOW (ref 1.15–1.40)
HCT: 38 % (ref 36.0–46.0)
HCT: 34 % — ABNORMAL LOW (ref 36.0–46.0)
Hemoglobin: 12.9 g/dL (ref 12.0–15.0)
Hemoglobin: 11.6 g/dL — ABNORMAL LOW (ref 12.0–15.0)
O2 Saturation: 79 %
O2 Saturation: 67 %
Patient temperature: 98.8
Patient temperature: 98
Potassium: 3.5 mmol/L (ref 3.5–5.1)
Potassium: 3.6 mmol/L (ref 3.5–5.1)
Sodium: 124 mmol/L — ABNORMAL LOW (ref 135–145)
Sodium: 126 mmol/L — ABNORMAL LOW (ref 135–145)
TCO2: 16 mmol/L — ABNORMAL LOW (ref 22–32)
TCO2: 17 mmol/L — ABNORMAL LOW (ref 22–32)
pCO2, Ven: 30.3 mmHg — ABNORMAL LOW (ref 44–60)
pCO2, Ven: 33.5 mmHg — ABNORMAL LOW (ref 44–60)
pH, Ven: 7.314 (ref 7.25–7.43)
pH, Ven: 7.283 (ref 7.25–7.43)
pO2, Ven: 46 mmHg — ABNORMAL HIGH (ref 32–45)
pO2, Ven: 38 mmHg (ref 32–45)

## 2022-11-01 LAB — URINALYSIS, ROUTINE W REFLEX MICROSCOPIC
Bilirubin Urine: NEGATIVE
Glucose, UA: >=500 mg/dL — AB
Ketones, ur: 15 mg/dL — AB
Nitrite: NEGATIVE
Protein, ur: NEGATIVE mg/dL
Specific Gravity, Urine: <=1.005 (ref 1.005–1.030)
pH: 5.5 (ref 5.0–8.0)

## 2022-11-01 LAB — BASIC METABOLIC PANEL
Anion gap: 18 — ABNORMAL HIGH (ref 5–15)
Anion gap: 14 (ref 5–15)
BUN: 33 mg/dL — ABNORMAL HIGH (ref 6–20)
BUN: 30 mg/dL — ABNORMAL HIGH (ref 6–20)
CO2: 15 mmol/L — ABNORMAL LOW (ref 22–32)
CO2: 16 mmol/L — ABNORMAL LOW (ref 22–32)
Calcium: 8.4 mg/dL — ABNORMAL LOW (ref 8.9–10.3)
Calcium: 7.5 mg/dL — ABNORMAL LOW (ref 8.9–10.3)
Chloride: 88 mmol/L — ABNORMAL LOW (ref 98–111)
Chloride: 95 mmol/L — ABNORMAL LOW (ref 98–111)
Creatinine, Ser: 1.27 mg/dL — ABNORMAL HIGH (ref 0.44–1.00)
Creatinine, Ser: 1.16 mg/dL — ABNORMAL HIGH (ref 0.44–1.00)
GFR, Estimated: 56 mL/min — ABNORMAL LOW (ref >60)
GFR, Estimated: >60 mL/min (ref >60)
Glucose, Bld: 596 mg/dL (ref 70–99)
Glucose, Bld: 531 mg/dL (ref 70–99)
Potassium: 3.3 mmol/L — ABNORMAL LOW (ref 3.5–5.1)
Potassium: 3.4 mmol/L — ABNORMAL LOW (ref 3.5–5.1)
Sodium: 121 mmol/L — ABNORMAL LOW (ref 135–145)
Sodium: 125 mmol/L — ABNORMAL LOW (ref 135–145)

## 2022-11-01 LAB — CBC WITH DIFFERENTIAL/PLATELET
Abs Immature Granulocytes: 0.94 10*3/uL — ABNORMAL HIGH (ref 0.00–0.07)
Basophils Absolute: 0.1 10*3/uL (ref 0.0–0.1)
Basophils Relative: 0 %
Eosinophils Absolute: 0 10*3/uL (ref 0.0–0.5)
Eosinophils Relative: 0 %
HCT: 34 % — ABNORMAL LOW (ref 36.0–46.0)
Hemoglobin: 11.3 g/dL — ABNORMAL LOW (ref 12.0–15.0)
Immature Granulocytes: 4 %
Lymphocytes Relative: 7 %
Lymphs Abs: 1.6 10*3/uL (ref 0.7–4.0)
MCH: 27.6 pg (ref 26.0–34.0)
MCHC: 33.2 g/dL (ref 30.0–36.0)
MCV: 83.1 fL (ref 80.0–100.0)
Monocytes Absolute: 1.8 10*3/uL — ABNORMAL HIGH (ref 0.1–1.0)
Monocytes Relative: 8 %
Neutro Abs: 18.4 10*3/uL — ABNORMAL HIGH (ref 1.7–7.7)
Neutrophils Relative %: 81 %
Platelets: 240 10*3/uL (ref 150–400)
RBC: 4.09 MIL/uL (ref 3.87–5.11)
RDW: 15.1 % (ref 11.5–15.5)
Smear Review: NORMAL
WBC: 22.8 10*3/uL — ABNORMAL HIGH (ref 4.0–10.5)
nRBC: 0.1 % (ref 0.0–0.2)

## 2022-11-01 LAB — RESP PANEL BY RT-PCR (RSV, FLU A&B, COVID)  RVPGX2
Influenza A by PCR: NEGATIVE
Influenza B by PCR: NEGATIVE
Resp Syncytial Virus by PCR: NEGATIVE
SARS Coronavirus 2 by RT PCR: NEGATIVE

## 2022-11-01 LAB — URINALYSIS, MICROSCOPIC (REFLEX)

## 2022-11-01 LAB — CBG MONITORING, ED
Glucose-Capillary: 583 mg/dL (ref 70–99)
Glucose-Capillary: 511 mg/dL (ref 70–99)
Glucose-Capillary: 433 mg/dL — ABNORMAL HIGH (ref 70–99)
Glucose-Capillary: 552 mg/dL (ref 70–99)
Glucose-Capillary: 356 mg/dL — ABNORMAL HIGH (ref 70–99)

## 2022-11-01 LAB — BETA-HYDROXYBUTYRIC ACID: Beta-Hydroxybutyric Acid: 2.17 mmol/L — ABNORMAL HIGH (ref 0.05–0.27)

## 2022-11-01 LAB — HCG, SERUM, QUALITATIVE: Preg, Serum: NEGATIVE

## 2022-11-01 SURGERY — IRRIGATION AND DEBRIDEMENT BUTTOCKS
Anesthesia: General | Site: Buttocks | Laterality: Right

## 2022-11-01 MED ORDER — POTASSIUM CHLORIDE 10 MEQ/100ML IV SOLN
10.0000 meq | INTRAVENOUS | Status: DC
  Administered 2022-11-01: 10 meq via INTRAVENOUS
  Filled 2022-11-01: qty 100

## 2022-11-01 MED ORDER — AMIODARONE LOAD VIA INFUSION
150.0000 mg | Freq: Once | INTRAVENOUS | Status: DC
  Filled 2022-11-01: qty 83.34

## 2022-11-01 MED ORDER — VANCOMYCIN HCL IN DEXTROSE 1-5 GM/200ML-% IV SOLN
1000.0000 mg | Freq: Once | INTRAVENOUS | Status: DC
  Filled 2022-11-01: qty 200

## 2022-11-01 MED ORDER — SODIUM CHLORIDE 0.9 % IV SOLN: INTRAVENOUS | Status: DC | PRN

## 2022-11-01 MED ORDER — LACTATED RINGERS IV SOLN: INTRAVENOUS | Status: DC

## 2022-11-01 MED ORDER — AMIODARONE HCL IN DEXTROSE 360-4.14 MG/200ML-% IV SOLN
60.0000 mg/h | INTRAVENOUS | Status: AC
  Filled 2022-11-01: qty 200

## 2022-11-01 MED ORDER — DEXTROSE 50 % IV SOLN: 0.0000 mL | INTRAVENOUS | Status: DC | PRN

## 2022-11-01 MED ORDER — AMIODARONE IV BOLUS ONLY 150 MG/100ML
INTRAVENOUS | Status: AC
  Administered 2022-11-01: 150 mg
  Filled 2022-11-01: qty 100

## 2022-11-01 MED ORDER — SODIUM CHLORIDE 0.9 % IV SOLN
2.0000 g | Freq: Once | INTRAVENOUS | Status: AC
  Administered 2022-11-01: 2 g via INTRAVENOUS
  Filled 2022-11-01: qty 12.5

## 2022-11-01 MED ORDER — VANCOMYCIN HCL 1500 MG/300ML IV SOLN
1500.0000 mg | Freq: Two times a day (BID) | INTRAVENOUS | Status: DC
  Administered 2022-11-02: 1500 mg via INTRAVENOUS
  Filled 2022-11-01 (×3): qty 300

## 2022-11-01 MED ORDER — AMIODARONE HCL IN DEXTROSE 360-4.14 MG/200ML-% IV SOLN
INTRAVENOUS | Status: AC
  Administered 2022-11-01: 60 mg/h via INTRAVENOUS
  Filled 2022-11-01: qty 200

## 2022-11-01 MED ORDER — LACTATED RINGERS IV SOLN: INTRAVENOUS | Status: DC | PRN

## 2022-11-01 MED ORDER — FENTANYL CITRATE (PF) 250 MCG/5ML IJ SOLN
INTRAMUSCULAR | Status: AC
  Filled 2022-11-01: qty 5

## 2022-11-01 MED ORDER — CLINDAMYCIN PHOSPHATE 900 MG/50ML IV SOLN
900.0000 mg | Freq: Three times a day (TID) | INTRAVENOUS | Status: DC
  Administered 2022-11-02 (×2): 900 mg via INTRAVENOUS
  Filled 2022-11-01 (×5): qty 50

## 2022-11-01 MED ORDER — INSULIN REGULAR(HUMAN) IN NACL 100-0.9 UT/100ML-% IV SOLN: INTRAVENOUS | Status: DC

## 2022-11-01 MED ORDER — LACTATED RINGERS IV BOLUS: 1000.0000 mL | Freq: Once | INTRAVENOUS | Status: DC

## 2022-11-01 MED ORDER — SODIUM CHLORIDE 0.9 % IV BOLUS
1000.0000 mL | Freq: Once | INTRAVENOUS | Status: AC
  Administered 2022-11-01: 1000 mL via INTRAVENOUS

## 2022-11-01 MED ORDER — VANCOMYCIN HCL IN DEXTROSE 1-5 GM/200ML-% IV SOLN: 1000.0000 mg | Freq: Once | INTRAVENOUS | Status: DC

## 2022-11-01 MED ORDER — PHENYLEPHRINE HCL-NACL 20-0.9 MG/250ML-% IV SOLN
INTRAVENOUS | Status: DC | PRN
  Administered 2022-11-01: 50 ug/min via INTRAVENOUS

## 2022-11-01 MED ORDER — LACTATED RINGERS IV BOLUS
1000.0000 mL | Freq: Once | INTRAVENOUS | Status: AC
  Administered 2022-11-01: 1000 mL via INTRAVENOUS

## 2022-11-01 MED ORDER — AMIODARONE LOAD VIA INFUSION
150.0000 mg | Freq: Once | INTRAVENOUS | Status: AC
  Administered 2022-11-01: 150 mg via INTRAVENOUS
  Filled 2022-11-01: qty 83.34

## 2022-11-01 MED ORDER — SODIUM CHLORIDE 0.9 % IV SOLN: 2.0000 g | Freq: Three times a day (TID) | INTRAVENOUS | Status: DC

## 2022-11-01 MED ORDER — MIDAZOLAM HCL 2 MG/2ML IJ SOLN
INTRAMUSCULAR | Status: AC
  Filled 2022-11-01: qty 2

## 2022-11-01 MED ORDER — VANCOMYCIN HCL 1250 MG/250ML IV SOLN
1250.0000 mg | Freq: Two times a day (BID) | INTRAVENOUS | Status: DC
  Filled 2022-11-01: qty 250

## 2022-11-01 MED ORDER — SODIUM CHLORIDE 0.9 % IV BOLUS: 1000.0000 mL | INTRAVENOUS | Status: AC

## 2022-11-01 MED ORDER — POTASSIUM CHLORIDE 10 MEQ/100ML IV SOLN
10.0000 meq | INTRAVENOUS | Status: AC
  Administered 2022-11-02 (×4): 10 meq via INTRAVENOUS
  Filled 2022-11-01 (×4): qty 100

## 2022-11-01 MED ORDER — AMIODARONE HCL IN DEXTROSE 360-4.14 MG/200ML-% IV SOLN
30.0000 mg/h | INTRAVENOUS | Status: DC
  Administered 2022-11-02 (×2): 30 mg/h via INTRAVENOUS
  Filled 2022-11-01 (×3): qty 200

## 2022-11-01 MED ORDER — DEXTROSE IN LACTATED RINGERS 5 % IV SOLN: INTRAVENOUS | Status: DC

## 2022-11-01 MED ORDER — SODIUM CHLORIDE (PF) 0.9 % IJ SOLN
INTRAMUSCULAR | Status: AC
  Filled 2022-11-01: qty 10

## 2022-11-01 MED ORDER — LACTATED RINGERS IV BOLUS: 2000.0000 mL | Freq: Once | INTRAVENOUS | Status: DC

## 2022-11-01 MED ORDER — IOHEXOL 350 MG/ML SOLN
125.0000 mL | Freq: Once | INTRAVENOUS | Status: AC | PRN
  Administered 2022-11-01: 125 mL via INTRAVENOUS

## 2022-11-01 MED ORDER — CALCIUM GLUCONATE-NACL 1-0.675 GM/50ML-% IV SOLN: 1.0000 g | Freq: Once | INTRAVENOUS | Status: DC

## 2022-11-01 MED ORDER — PROPOFOL 10 MG/ML IV BOLUS
INTRAVENOUS | Status: AC
  Filled 2022-11-01: qty 20

## 2022-11-01 MED ORDER — PIPERACILLIN-TAZOBACTAM 3.375 G IVPB
3.3750 g | Freq: Three times a day (TID) | INTRAVENOUS | Status: DC
  Administered 2022-11-02 – 2022-11-03 (×3): 3.375 g via INTRAVENOUS
  Filled 2022-11-01 (×7): qty 50

## 2022-11-01 MED ORDER — METRONIDAZOLE 500 MG/100ML IV SOLN
500.0000 mg | Freq: Once | INTRAVENOUS | Status: AC
  Administered 2022-11-01: 500 mg via INTRAVENOUS
  Filled 2022-11-01: qty 100

## 2022-11-01 MED ORDER — LEVOTHYROXINE SODIUM 25 MCG PO TABS
50.0000 ug | ORAL_TABLET | Freq: Every day | ORAL | Status: DC
  Administered 2022-11-02 – 2022-11-04 (×3): 50 ug
  Filled 2022-11-01 (×3): qty 2

## 2022-11-01 MED ORDER — 0.9 % SODIUM CHLORIDE (POUR BTL) OPTIME
TOPICAL | Status: DC | PRN
  Administered 2022-11-01: 1000 mL

## 2022-11-01 MED ORDER — ALBUMIN HUMAN 5 % IV SOLN: INTRAVENOUS | Status: DC | PRN

## 2022-11-01 MED ORDER — HEPARIN SODIUM (PORCINE) 5000 UNIT/ML IJ SOLN
5000.0000 [IU] | Freq: Three times a day (TID) | INTRAMUSCULAR | Status: DC
  Administered 2022-11-02 – 2022-11-03 (×4): 5000 [IU] via SUBCUTANEOUS
  Filled 2022-11-01 (×4): qty 1

## 2022-11-01 MED ORDER — INSULIN REGULAR(HUMAN) IN NACL 100-0.9 UT/100ML-% IV SOLN
INTRAVENOUS | Status: DC
  Administered 2022-11-01: 15 [IU]/h via INTRAVENOUS
  Administered 2022-11-02: 21 [IU]/h via INTRAVENOUS
  Administered 2022-11-02: 10.5 [IU]/h via INTRAVENOUS
  Filled 2022-11-01 (×4): qty 100

## 2022-11-01 SURGICAL SUPPLY — 35 items
BAG COUNTER SPONGE SURGICOUNT (BAG) ×2 IMPLANT
BAG SPNG CNTER NS LX DISP (BAG) ×1
BNDG GAUZE DERMACEA FLUFF 4 (GAUZE/BANDAGES/DRESSINGS) IMPLANT
BNDG GZE DERMACEA 4 6PLY (GAUZE/BANDAGES/DRESSINGS) ×4
CANISTER SUCT 3000ML PPV (MISCELLANEOUS) ×2 IMPLANT
CNTNR URN SCR LID CUP LEK RST (MISCELLANEOUS) IMPLANT
CONT SPEC 4OZ STRL OR WHT (MISCELLANEOUS) ×1
COVER SURGICAL LIGHT HANDLE (MISCELLANEOUS) ×2 IMPLANT
DRAPE LAPAROSCOPIC ABDOMINAL (DRAPES) IMPLANT
DRAPE LAPAROTOMY 100X72 PEDS (DRAPES) IMPLANT
ELECT REM PT RETURN 9FT ADLT (ELECTROSURGICAL) ×1
ELECTRODE REM PT RTRN 9FT ADLT (ELECTROSURGICAL) ×2 IMPLANT
GAUZE PAD ABD 8X10 STRL (GAUZE/BANDAGES/DRESSINGS) IMPLANT
GAUZE SPONGE 4X4 12PLY STRL (GAUZE/BANDAGES/DRESSINGS) IMPLANT
GLOVE BIO SURGEON STRL SZ7.5 (GLOVE) ×2 IMPLANT
GLOVE BIOGEL PI IND STRL 8 (GLOVE) ×2 IMPLANT
GLOVE SURG SYN 7.5  E (GLOVE) ×1
GLOVE SURG SYN 7.5 E (GLOVE) ×1 IMPLANT
GLOVE SURG SYN 7.5 PF PI (GLOVE) ×2 IMPLANT
GOWN STRL REUS W/ TWL LRG LVL3 (GOWN DISPOSABLE) ×4 IMPLANT
GOWN STRL REUS W/ TWL XL LVL3 (GOWN DISPOSABLE) ×2 IMPLANT
GOWN STRL REUS W/TWL LRG LVL3 (GOWN DISPOSABLE) ×2
GOWN STRL REUS W/TWL XL LVL3 (GOWN DISPOSABLE) ×1
KIT BASIN OR (CUSTOM PROCEDURE TRAY) ×2 IMPLANT
KIT TURNOVER KIT B (KITS) ×2 IMPLANT
NS IRRIG 1000ML POUR BTL (IV SOLUTION) ×2 IMPLANT
PACK GENERAL/GYN (CUSTOM PROCEDURE TRAY) ×2 IMPLANT
PAD ARMBOARD 7.5X6 YLW CONV (MISCELLANEOUS) ×2 IMPLANT
PENCIL SMOKE EVACUATOR (MISCELLANEOUS) ×2 IMPLANT
SPONGE LAP 18X18 X RAY DECT (DISPOSABLE) IMPLANT
SWAB COLLECTION DEVICE MRSA (MISCELLANEOUS) IMPLANT
SWAB CULTURE ESWAB REG 1ML (MISCELLANEOUS) IMPLANT
TAPE CLOTH SURG 6X10 WHT LF (GAUZE/BANDAGES/DRESSINGS) IMPLANT
TOWEL GREEN STERILE (TOWEL DISPOSABLE) ×2 IMPLANT
TOWEL GREEN STERILE FF (TOWEL DISPOSABLE) ×2 IMPLANT

## 2022-11-01 NOTE — Progress Notes (Addendum)
Pharmacy Antibiotic Note  Alexias Margerum is a 37 y.o. 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   SCr 1.16 WBC 22.8; LA 2.9; T 98.8; HR 141; RR 39 COVID neg / flu neg  Plan: Start clindamycin 900 mg IV q8h  Start zosyn 3.375 g IV q8h  Continue vancomycin 1500 mg q12hr (eAUC 526) unless change in renal function  Trend WBC, Fever, Renal function F/u cultures, clinical course, WBC De-escalate when able Levels at steady state  Height: 5\' 6"  (167.6 cm) Weight: (!) 201.9 kg (445 lb) IBW/kg (Calculated) : 59.3  Temp (24hrs), Avg:98.8 F (37.1 C), Min:98.8 F (37.1 C), Max:98.8 F (37.1 C)  Recent Labs  Lab 10-Nov-2022 1811 10-Nov-2022 1942 November 10, 2022 2032  WBC 22.8*  --   --   CREATININE 1.27*  --  1.16*  LATICACIDVEN 3.5* 2.9*  --     Estimated Creatinine Clearance: 123.1 mL/min (A) (by C-G formula based on SCr of 1.16 mg/dL (H)).    Allergies  Allergen Reactions   Bupropion Other (See Comments), Palpitations and Shortness Of Breath   Anti-Inflammatory Enzyme [Nutritional Supplements] Swelling    And fluid on the brain   Hydroxyzine Other (See Comments)   Metformin Nausea Only and Other (See Comments)   Metformin Hcl Er Other (See Comments)   Nsaids Other (See Comments), Hypertension and Swelling   Rosuvastatin Nausea And Vomiting and Other (See Comments)    Antimicrobials this admission: cefepime 11-10-22 x1 vancomycin 11-10-2022 >>  clindamycin 2022-11-10 >> zosyn November 10, 2022 >>   Microbiology results: Pending   Thank you for allowing pharmacy to be a part of this patient's care.  Gena Fray, PharmD PGY1 Pharmacy Resident   11-10-2022 11:22 PM

## 2022-11-01 NOTE — ED Triage Notes (Signed)
States hx of diabetes, was eating bad over the holidays. States her BG has been high, also has UTI & yeast infection & "boils" that are draining on her buttocks. C/o feeling feverish and short of breath. Started on antibiotic yesterday.

## 2022-11-01 NOTE — Progress Notes (Signed)
Pharmacy Antibiotic Note  Lindsey [name] is a 37 y.o. female for which pharmacy has been consulted for cefepime and vancomycin dosing for cellulitis.  SCr 1.16 WBC 22.8; LA 2.9; T 98.8; HR 141; RR 39 COVID neg / flu neg  Plan: Cefepime 2g q8hr Vancomycin 2000 mg once then 1500 mg q12hr (eAUC 526) unless change in renal function Trend WBC, Fever, Renal function F/u cultures, clinical course, WBC De-escalate when able Levels at steady state  Height: 5\' 6"  (167.6 cm) Weight: (!) 204.1 kg (450 lb) IBW/kg (Calculated) : 59.3  Temp (24hrs), Avg:98.8 F (37.1 C), Min:98.8 F (37.1 C), Max:98.8 F (37.1 C)  No results for input(s): "WBC", "CREATININE", "LATICACIDVEN", "VANCOTROUGH", "VANCOPEAK", "VANCORANDOM", "GENTTROUGH", "GENTPEAK", "GENTRANDOM", "TOBRATROUGH", "TOBRAPEAK", "TOBRARND", "AMIKACINPEAK", "AMIKACINTROU", "AMIKACIN" in the last 168 hours.  CrCl cannot be calculated (Patient's most recent lab result is older than the maximum 21 days allowed.).    Allergies  Allergen Reactions   Bupropion Other (See Comments), Palpitations and Shortness Of Breath   Anti-Inflammatory Enzyme [Nutritional Supplements] Swelling    And fluid on the brain   Hydroxyzine Other (See Comments)   Metformin Nausea Only and Other (See Comments)   Metformin Hcl Er Other (See Comments)   Nsaids Other (See Comments), Hypertension and Swelling   Rosuvastatin Nausea And Vomiting and Other (See Comments)    Antimicrobials this admission: cefepime 1/9 >>  vancomycin 1/9 >>   Microbiology results: Pending  Thank you for allowing pharmacy to be a part of this patient's care.  Lorelei Pont, PharmD, BCPS 11/22/2022 6:24 PM ED Clinical Pharmacist -  [phone]

## 2022-11-01 NOTE — ED Notes (Signed)
Pt returned from CT and when placed back on the monitor was found to be in Afib RVR.

## 2022-11-01 NOTE — ED Notes (Signed)
Verified weight per CT request due to potential for table limit.

## 2022-11-01 NOTE — ED Notes (Signed)
Report given to [facility], CL RN at bedside. Pt was transferred to their stretcher and taken to [facility] ED via CL.

## 2022-11-01 NOTE — ED Notes (Signed)
POC CBG taken before CL departed and EndoTool used to adjust insulin before departure. Lab resulted after, but was pulled beforehand.

## 2022-11-01 NOTE — ED Notes (Signed)
Vanc on hold due to Maxipime still finishing up.

## 2022-11-01 NOTE — H&P (Addendum)
NAME:  Lindsey [name], MRN:  [id number], DOB:  [birth year], LOS: 0 ADMISSION DATE:  10/25/2022, CONSULTATION DATE:  11/18/2022 REFERRING MD:  Rubin Payor CHIEF COMPLAINT:  Fatigue, buttock wounds   History of Present Illness:  Lindsey [name] is a 37 y.o. female who has a PMH as below including DM2. She presented to [facility] 1/9 with fatigue, dyspnea, buttock wounds that had been getting worse. First noticed buttock wound around thanksgiving but it healed on its own. Then recurred around new years and has persisted and worsened to the point of malodorous drainage. She has also had fatigue and dyspnea. She apparently had been eating badly around the holidays and her CBG's had been reading high. Normally she states that CBG's are well controlled.  In ED, she was found to have HHS. 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  While at [facility], she also developed A.Fib RVR for which she received Amiodarone bolus x 2 followed by infusion. She was given one dose of Flagyl and was transferred to [facility] ED for surgical evaluation. En route to [facility], she converted to NSR. BP has been stable since ED presentation.  She was evaluated by surgery and is being taken to the OR for debridement tonight. She is currently on Insulin infusion, Amiodarone infusion, potassium supplementation. Her mother is with her at the bedside.  Pertinent  Medical History:  has Pseudotumor cerebri; Obesity, morbid (HCC); Vision disturbance; Idiopathic intracranial hypertension; Type 2 diabetes mellitus with other specified complication (HCC); Anxiety; Depression; High cholesterol; Hypertension; Hypothyroidism; Major depression in partial remission (HCC); Panic disorder; Morbid obesity (HCC); Major depressive disorder, recurrent episode, severe (HCC); Panic disorder with agoraphobia; and Hyperosmolar hyperglycemic state (HHS) (HCC) on their  problem list.  Significant Hospital Events: Including procedures, antibiotic start and stop dates in addition to other pertinent events   1/9 admit, to OR.  Interim History / Subjective:  Converted to NSR, remains on Amio at 60. BP stable. Mother at bedside. Evaluated by Dr. Derrell Lolling with surgery who plans to take her to OR tonight.  Objective:  Blood pressure (!) 123/53, pulse 96, temperature 98.8 F (37.1 C), resp. rate (!) 25, height 5\' 6"  (1.676 m), weight (!) 201.9 kg, SpO2 97 %.        Intake/Output Summary (Last 24 hours) at 11/12/2022 2243 Last data filed at 10/28/2022 2130 Gross per 24 hour  Intake 1400 ml  Output --  Net 1400 ml   Filed Weights   11/21/2022 1734 10/29/2022 1858  Weight: (!) 204.1 kg (!) 201.9 kg    Examination: General: Young AA female, resting in bed, in NAD. Neuro: A&O x 3, no deficits. HEENT: [place]/AT. Sclerae anicteric. EOMI. MM dry. Cardiovascular: RRR, no M/R/G.  Lungs: Respirations even and unlabored.  CTA bilaterally, No W/R/R. Abdomen: Morbidly obese. BS x 4, soft, NT/ND.  Musculoskeletal: No gross deformities, no edema.  Skin: R buttock with errythema and multiple wounds with drainage. Skin otherwise warm, no rashes.  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  Assessment & Plan:   HHS - presumed 2/2 Fournier's Gangrene. Hx DM 2. - Insulin and fluids per protocol. - DM educator consult. - Needs close follow up on discharge. - Hold home Pioglitazone and Glimepiride.  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 -  Empiric Vancomycin, change Cefepime to Zosyn. - Add Clindamycin for toxin inhibition. - Follow cultures. - Surgery on board, planning for OR tonight and will likely require multiple return trips for ongoing debridement. Appreciate the assistance.  A.fib with RVR - 2/2 above, now  converted to NSR after Amiodarone. Hx HTN, HLD. - Continue Amiodarone for now. - Supportive care. - Hold home Diamox, Irbesartan, Pravastatin, Propranolol.  Pseudohyponatremia - 2/2 Hyperglycemia. Hypokalemia - being repleted. AKI. NAGMA. Hypocalcemia. - Continue K supplementation. - BMP q4hrs. - Continue fluids. - 1g Ca gluconate.  Hx Hypothyroidism. - Continue home Synthroid.  Hx Anxiety, Depression, Panic disorder, PTSD. - Hold home Alprazolam, Quetiapine, Venlafaxine, Lorazepam.  Best practice (evaluated daily):  Diet/type: NPO DVT prophylaxis: prophylactic heparin  GI prophylaxis: N/A Lines: N/A Foley:  N/A Code Status:  full code Last date of multidisciplinary goals of care discussion: None yet.  Labs   CBC: Recent Labs  Lab 11/22/2022 1807 11/23/2022 1811 11/11/2022 2024  WBC  --  22.8*  --   NEUTROABS  --  18.4*  --   HGB 12.9 11.3* 11.6*  HCT 38.0 34.0* 34.0*  MCV  --  83.1  --   PLT  --  240  --     Basic Metabolic Panel: Recent Labs  Lab 11/13/2022 1807 11/11/2022 1811 11/21/2022 2024 10/28/2022 2032  NA 124* 121* 126* 125*  K 3.5 3.3* 3.6 3.4*  CL  --  88*  --  95*  CO2  --  15*  --  16*  GLUCOSE  --  596*  --  531*  BUN  --  33*  --  30*  CREATININE  --  1.27*  --  1.16*  CALCIUM  --  8.4*  --  7.5*   GFR: Estimated Creatinine Clearance: 123.1 mL/min (A) (by C-G formula based on SCr of 1.16 mg/dL (H)). Recent Labs  Lab 10/31/2022 1811 10/31/2022 1942  WBC 22.8*  --   LATICACIDVEN 3.5* 2.9*    Liver Function Tests: No results for input(s): "AST", "ALT", "ALKPHOS", "BILITOT", "PROT", "ALBUMIN" in the last 168 hours. No results for input(s): "LIPASE", "AMYLASE" in the last 168 hours. No results for input(s): "AMMONIA" in the last 168 hours.  ABG    Component Value Date/Time   HCO3 15.9 (L) 11/11/2022 2024   TCO2 17 (L) 11/08/2022 2024   ACIDBASEDEF 10.0 (H) 10/24/2022 2024   O2SAT 67 10/30/2022 2024     Coagulation Profile: No results for  input(s): "INR", "PROTIME" in the last 168 hours.  Cardiac Enzymes: No results for input(s): "CKTOTAL", "CKMB", "CKMBINDEX", "TROPONINI" in the last 168 hours.  HbA1C: No results found for: "HGBA1C"  CBG: Recent Labs  Lab 10/31/2022 1738 11/04/2022 2012 11/10/2022 2054 10/24/2022 2129 11/04/2022 2227  GLUCAP 583* 511* 552* 433* 356*    Review of Systems:   All negative; except for those that are bolded, which indicate positives.  Constitutional: weight loss, weight gain, night sweats, fevers, chills, fatigue, weakness.  HEENT: headaches, sore throat, sneezing, nasal congestion, post nasal drip, difficulty swallowing, tooth/dental problems, visual complaints, visual changes, ear aches. Neuro: difficulty with speech, weakness, numbness, ataxia. CV:  chest pain, orthopnea, PND, swelling in lower extremities, dizziness, palpitations, syncope.  Resp: cough, hemoptysis, dyspnea, wheezing. GI: heartburn, indigestion, abdominal pain, nausea, vomiting, diarrhea, constipation, change in bowel habits, loss of appetite, hematemesis, melena, hematochezia.  GU: dysuria, change in color of urine, urgency or frequency, flank pain, hematuria, right buttock and gluteal pain and wounds with drainage.  MSK: joint pain or swelling, decreased range of motion. Psych: change in mood or affect, depression, anxiety, suicidal ideations, homicidal ideations. Skin: rash, itching, bruising.   Past Medical History:  She,  has a past medical history of Anxiety and depression, Depression (02/22/2015), Diabetes (HCC), Headache(784.0), High cholesterol, Hypercholesteremia, Hyperlipidemia, Hypertension, Hypothyroidism, Obesity, Panic disorder, Polycystic ovary, Pseudotumor cerebri, and PTSD (post-traumatic stress disorder).   Surgical History:   Past Surgical History:  Procedure Laterality Date   LUMBAR PUNCTURE       Social History:   reports that she has quit smoking. Her smoking use included cigarettes. She smoked  an average of .25 packs per day. She has never used smokeless tobacco. She reports current drug use. Drug: Marijuana. She reports that she does not drink alcohol.   Family History:  Her family history includes Breast cancer in her maternal grandmother; Cancer in an other family member; Coronary artery disease in her mother and another family member; Diabetes in an other family member; Heart failure in her maternal grandfather; Kidney disease in her father; Narcolepsy in her maternal grandmother; Polycystic ovary syndrome in her mother.   Allergies Allergies  Allergen Reactions   Bupropion Other (See Comments), Palpitations and Shortness Of Breath   Anti-Inflammatory Enzyme [Nutritional Supplements] Swelling    And fluid on the brain   Hydroxyzine Other (See Comments)   Metformin Nausea Only and Other (See Comments)   Metformin Hcl Er Other (See Comments)   Nsaids Other (See Comments), Hypertension and Swelling   Rosuvastatin Nausea And Vomiting and Other (See Comments)     Home Medications  Prior to Admission medications   Medication Sig Start Date End Date Taking? Authorizing Provider  acetaZOLAMIDE ER (DIAMOX) 500 MG capsule Take 1 capsule (500 mg total) by mouth in the morning, at noon, and at bedtime. Patient taking differently: Take 500 mg by mouth 2 (two) times daily. 06/21/21 07/21/21  Windell Norfolk, MD  ALPRAZolam Prudy Feeler) 0.25 MG tablet Take 0.25 mg by mouth daily. 06/17/21   [provider]  ASHWAGANDHA PO Take 1,300 mg by mouth daily.    [provider]  B Complex CAPS See admin instructions.    [provider]  glimepiride (AMARYL) 2 MG tablet Take 2 mg by mouth daily. 05/29/21   [provider]  irbesartan (AVAPRO) 300 MG tablet Take 300 mg by mouth daily. 05/31/21   [provider]  levothyroxine (SYNTHROID) 50 MCG tablet Take 50 mcg by mouth daily. 06/02/21   [provider]  LORazepam (ATIVAN) 1 MG tablet Take 1 mg by mouth 2  (two) times daily. Patient not taking: Reported on 03/09/2022 02/11/22   [provider]  Multiple Vitamin (MULTIVITAMIN) tablet Take 1 tablet by mouth daily.    [provider]  pioglitazone (ACTOS) 45 MG tablet Take 45 mg by mouth daily. 02/18/21   [provider]  pravastatin (PRAVACHOL) 20 MG tablet Take 20 mg by mouth daily. 05/29/21   [provider]  propranolol (INDERAL) 10 MG tablet Take 1 tablet (10 mg total) by mouth daily. 03/28/22 04/27/22  Oneta Rack, NP  QUEtiapine (SEROQUEL) 50 MG tablet Take 50 mg by mouth at bedtime. 01/17/22   [provider]  venlafaxine (EFFEXOR) 75 MG tablet Take 1 tablet (75 mg total) by mouth at bedtime AND 2 tablets (150 mg total) daily. 03/28/22 04/27/22  Oneta Rack, NP     Critical care time: 40 min.   Rutherford Guys, PA - C [place]  Pulmonary & Critical Care Medicine For pager details, please see AMION or use Epic chat  After 1900, please call [facility] for cross coverage needs 10/28/2022, 10:43 PM

## 2022-11-01 NOTE — ED Provider Notes (Signed)
  Physical Exam  BP (!) 123/53   Pulse 96   Temp 98.8 F (37.1 C)   Resp (!) 25   Ht 5\' 6"  (1.676 m)   Wt (!) 201.9 kg   SpO2 97%   BMI 71.82 kg/m   Physical Exam  Procedures  Procedures  ED Course / MDM   Clinical Course as of 23-Nov-2022 2252  Tue 2022/11/23 Dr Alvino Chapel from [facility] accepts for ED to ED transfer.  [RP]  2046 Spoke with Dr Rosendo Gros from general surgery who will see the patient for her Fournier's gangrene when she gets to [facility].  Did also discussed with Dr. Tacy Learn from critical care who will evaluate the patient in the emergency department and see if she requires ICU or progressive unit. [RP]  2111 [facility] has arrived to transport patient to [facility].  [RP]    Clinical Course User Index [RP] Fransico Meadow, MD   Medical Decision Making Amount and/or Complexity of Data Reviewed Labs: ordered. Radiology: ordered.  Risk Prescription drug management. Decision regarding hospitalization.   Transfer from [place].  Fournier's gangrene, A-fib with RVR, DKA.  To see general surgery and pulmonary critical care.  I discussed with both of them who saw the patient in the ER.  Patient appears to was converted to a sinus rhythm with the amiodarone.  Will be taken to the OR and then to ICU after.       Davonna Belling, MD 2022/11/23 2253

## 2022-11-01 NOTE — Anesthesia Preprocedure Evaluation (Addendum)
Anesthesia Evaluation  Patient identified by MRN, date of birth, ID band Patient awake    Reviewed: Allergy & Precautions, NPO status , Patient's Chart, lab work & pertinent test results  Airway Mallampati: IV   Neck ROM: Limited    Dental  (+) Teeth Intact, Dental Advisory Given   Pulmonary former smoker    + decreased breath sounds      Cardiovascular hypertension, Pt. on medications  Rhythm:Regular     Neuro/Psych  Headaches PSYCHIATRIC DISORDERS Anxiety Depression       GI/Hepatic negative GI ROS, Neg liver ROS,,,  Endo/Other  diabetes, Poorly ControlledHypothyroidism    Renal/GU negative Renal ROS     Musculoskeletal negative musculoskeletal ROS (+)    Abdominal  (+) + obese  Peds  Hematology negative hematology ROS (+)   Anesthesia Other Findings   Reproductive/Obstetrics                             Anesthesia Physical Anesthesia Plan  ASA: 4 and emergent  Anesthesia Plan: General   Post-op Pain Management: Ofirmev IV (intra-op)*   Induction: Intravenous, Rapid sequence and Cricoid pressure planned  PONV Risk Score and Plan: Ondansetron, Treatment may vary due to age or medical condition, Midazolam and Dexamethasone  Airway Management Planned: Oral ETT and Video Laryngoscope Planned  Additional Equipment:   Intra-op Plan:   Post-operative Plan: Post-operative intubation/ventilation  Informed Consent: I have reviewed the patients History and Physical, chart, labs and discussed the procedure including the risks, benefits and alternatives for the proposed anesthesia with the patient or authorized representative who has indicated his/her understanding and acceptance.     Dental advisory given  Plan Discussed with: CRNA  Anesthesia Plan Comments: (+/- CVL)       Anesthesia Quick Evaluation

## 2022-11-01 NOTE — Consult Note (Addendum)
Reason for Consult:soft tissie infection Referring Physician: Dr. Donnetta [name] Harwood is an 37 y.o. female.  HPI: 72 F with infection to the right glut area.  She states it started around New years day.  She states it con't to worsen in the interim.  She presented to the ED for evaluation secondary to con't pain.  Pt with underwent CT an d labs that I reviewed which showed large gas containing infection of the right gluteus.  Pt with elev BS and WBC.  Pt also in RVR.  Pt was transferred to [facility] hosp for eval and treatment  Past Medical History:  Diagnosis Date   Anxiety and depression    Depression 02/22/2015   Diabetes (HCC)    Headache(784.0)    High cholesterol    Hypercholesteremia    Hyperlipidemia    Hypertension    Hypothyroidism    Obesity    Panic disorder    Polycystic ovary    Pseudotumor cerebri    PTSD (post-traumatic stress disorder)     Past Surgical History:  Procedure Laterality Date   LUMBAR PUNCTURE      Family History  Problem Relation Age of Onset   Polycystic ovary syndrome Mother    Coronary artery disease Mother    Kidney disease Father    Narcolepsy Maternal Grandmother    Breast cancer Maternal Grandmother    Heart failure Maternal Grandfather    Cancer Other    Coronary artery disease Other    Diabetes Other     Social History:  reports that she has quit smoking. Her smoking use included cigarettes. She smoked an average of .25 packs per day. She has never used smokeless tobacco. She reports current drug use. Drug: Marijuana. She reports that she does not drink alcohol.  Allergies:  Allergies  Allergen Reactions   Bupropion Other (See Comments), Palpitations and Shortness Of Breath   Anti-Inflammatory Enzyme [Nutritional Supplements] Swelling    And fluid on the brain   Hydroxyzine Other (See Comments)   Metformin Nausea Only and Other (See Comments)   Metformin Hcl Er Other (See Comments)   Nsaids Other (See Comments),  Hypertension and Swelling   Rosuvastatin Nausea And Vomiting and Other (See Comments)    Medications: I have reviewed the patient's current medications.  Results for orders placed or performed during the hospital encounter of 11/10/2022 (from the past 48 hour(s))  CBG monitoring, ED     Status: Abnormal   Collection Time: 11/14/2022  5:38 PM  Result Value Ref Range   Glucose-Capillary 583 (HH) 70 - 99 mg/dL    Comment: Glucose reference range applies only to samples taken after fasting for at least 8 hours.   Comment 1 Notify RN   I-Stat venous blood gas, ED     Status: Abnormal   Collection Time: 11/16/2022  6:07 PM  Result Value Ref Range   pH, Ven 7.314 7.25 - 7.43   pCO2, Ven 30.3 (L) 44 - 60 mmHg   pO2, Ven 46 (H) 32 - 45 mmHg   Bicarbonate 15.4 (L) 20.0 - 28.0 mmol/L   TCO2 16 (L) 22 - 32 mmol/L   O2 Saturation 79 %   Acid-base deficit 10.0 (H) 0.0 - 2.0 mmol/L   Sodium 124 (L) 135 - 145 mmol/L   Potassium 3.5 3.5 - 5.1 mmol/L   Calcium, Ion 1.10 (L) 1.15 - 1.40 mmol/L   HCT 38.0 36.0 - 46.0 %   Hemoglobin 12.9 12.0 - 15.0 g/dL  Patient temperature 98.8 F    Collection site IV start    Drawn by Nurse    Sample type VENOUS   Basic metabolic panel     Status: Abnormal   Collection Time: 11/09/2022  6:11 PM  Result Value Ref Range   Sodium 121 (L) 135 - 145 mmol/L   Potassium 3.3 (L) 3.5 - 5.1 mmol/L   Chloride 88 (L) 98 - 111 mmol/L   CO2 15 (L) 22 - 32 mmol/L   Glucose, Bld 596 (HH) 70 - 99 mg/dL    Comment: CRITICAL RESULT CALLED TO, READ BACK BY AND VERIFIED WITH M. SIMMS, RN [phone] 01.09.23 MRIVET Glucose reference range applies only to samples taken after fasting for at least 8 hours.    BUN 33 (H) 6 - 20 mg/dL   Creatinine, Ser 2.13 (H) 0.44 - 1.00 mg/dL   Calcium 8.4 (L) 8.9 - 10.3 mg/dL   GFR, Estimated 56 (L) >60 mL/min    Comment: (NOTE) Calculated using the CKD-EPI Creatinine Equation (2021)    Anion gap 18 (H) 5 - 15    Comment: Performed at [facility], [address] [place], [place] [address]  Beta-hydroxybutyric acid     Status: Abnormal   Collection Time: 11/06/2022  6:11 PM  Result Value Ref Range   Beta-Hydroxybutyric Acid 2.17 (H) 0.05 - 0.27 mmol/L    Comment: Performed at [facility], [address], [place], [place] [address]  CBC with Differential (PNL)     Status: Abnormal   Collection Time: 10/26/2022  6:11 PM  Result Value Ref Range   WBC 22.8 (H) 4.0 - 10.5 K/uL    Comment: WHITE COUNT CONFIRMED ON SMEAR   RBC 4.09 3.87 - 5.11 MIL/uL   Hemoglobin 11.3 (L) 12.0 - 15.0 g/dL   HCT 29.5 (L) 28.4 - 13.2 %   MCV 83.1 80.0 - 100.0 fL   MCH 27.6 26.0 - 34.0 pg   MCHC 33.2 30.0 - 36.0 g/dL   RDW 44.0 10.2 - 72.5 %   Platelets 240 150 - 400 K/uL   nRBC 0.1 0.0 - 0.2 %   Neutrophils Relative % 81 %   Neutro Abs 18.4 (H) 1.7 - 7.7 K/uL   Lymphocytes Relative 7 %   Lymphs Abs 1.6 0.7 - 4.0 K/uL   Monocytes Relative 8 %   Monocytes Absolute 1.8 (H) 0.1 - 1.0 K/uL   Eosinophils Relative 0 %   Eosinophils Absolute 0.0 0.0 - 0.5 K/uL   Basophils Relative 0 %   Basophils Absolute 0.1 0.0 - 0.1 K/uL   WBC Morphology MILD LEFT SHIFT (1-5% METAS, OCC MYELO, OCC BANDS)    RBC Morphology MORPHOLOGY UNREMARKABLE    Smear Review Normal platelet morphology    Immature Granulocytes 4 %   Abs Immature Granulocytes 0.94 (H) 0.00 - 0.07 K/uL    Comment: Performed at [facility], [address] [place], [place] [address]  Lactic acid, plasma     Status: Abnormal   Collection Time: 10/29/2022  6:11 PM  Result Value Ref Range   Lactic Acid, Venous 3.5 (HH) 0.5 - 1.9 mmol/L    Comment: CRITICAL RESULT CALLED TO, READ BACK BY AND VERIFIED WITH Thresa Ross, RN [phone] 01.09.24 MRIVET Performed at [facility], [address] [place], [place] [address]   hCG, serum, qualitative     Status: Lindsey [name]   Collection Time: 11/09/2022  6:11 PM  Result Value Ref Range   Preg, Serum NEGATIVE NEGATIVE    Comment:        THE  SENSITIVITY OF THIS METHODOLOGY IS >10 mIU/mL. Performed at [facility], [address] [place], [place] [address]   Resp panel by RT-PCR (RSV, Flu A&B, Covid) Anterior Nasal Swab     Status: Lindsey [name]   Collection Time: 10/31/2022  6:58 PM   Specimen: Anterior Nasal Swab  Result Value Ref Range   SARS Coronavirus 2 by RT PCR NEGATIVE NEGATIVE    Comment: (NOTE) SARS-CoV-2 target nucleic acids are NOT DETECTED.  The SARS-CoV-2 RNA is generally detectable in upper respiratory specimens during the acute phase of infection. The lowest concentration of SARS-CoV-2 viral copies this assay can detect is 138 copies/mL. A negative result does not preclude SARS-Cov-2 infection and should not be used as the sole basis for treatment or other patient management decisions. A negative result may occur with  improper specimen collection/handling, submission of specimen other than nasopharyngeal swab, presence of viral mutation(s) within the areas targeted by this assay, and inadequate number of viral copies(<138 copies/mL). A negative result must be combined with clinical observations, patient history, and epidemiological information. The expected result is Negative.  Fact Sheet for Patients:  BloggerCourse.com  Fact Sheet for Healthcare Providers:  SeriousBroker.it  This test is no t yet approved or cleared by the [place] FDA and  has been authorized for detection and/or diagnosis of SARS-CoV-2 by FDA under an Emergency Use Authorization (EUA). This EUA will remain  in effect (meaning this test can be used) for the duration of the COVID-19 declaration under Section 564(b)(1) of the Act, 21 U.S.C.section 360bbb-3(b)(1), unless the authorization is terminated  or revoked sooner.       Influenza A by PCR NEGATIVE NEGATIVE   Influenza B by PCR NEGATIVE NEGATIVE    Comment: (NOTE) The Xpert Xpress SARS-CoV-2/FLU/RSV plus assay is  intended as an aid in the diagnosis of influenza from Nasopharyngeal swab specimens and should not be used as a sole basis for treatment. Nasal washings and aspirates are unacceptable for Xpert Xpress SARS-CoV-2/FLU/RSV testing.  Fact Sheet for Patients: BloggerCourse.com  Fact Sheet for Healthcare Providers: SeriousBroker.it  This test is not yet approved or cleared by the [place] FDA and has been authorized for detection and/or diagnosis of SARS-CoV-2 by FDA under an Emergency Use Authorization (EUA). This EUA will remain in effect (meaning this test can be used) for the duration of the COVID-19 declaration under Section 564(b)(1) of the Act, 21 U.S.C. section 360bbb-3(b)(1), unless the authorization is terminated or revoked.     Resp Syncytial Virus by PCR NEGATIVE NEGATIVE    Comment: (NOTE) Fact Sheet for Patients: BloggerCourse.com  Fact Sheet for Healthcare Providers: SeriousBroker.it  This test is not yet approved or cleared by the [place] FDA and has been authorized for detection and/or diagnosis of SARS-CoV-2 by FDA under an Emergency Use Authorization (EUA). This EUA will remain in effect (meaning this test can be used) for the duration of the COVID-19 declaration under Section 564(b)(1) of the Act, 21 U.S.C. section 360bbb-3(b)(1), unless the authorization is terminated or revoked.  Performed at [facility], [address] [place], [place] [address]   Lactic acid, plasma     Status: Abnormal   Collection Time: 10/24/2022  7:42 PM  Result Value Ref Range   Lactic Acid, Venous 2.9 (HH) 0.5 - 1.9 mmol/L    Comment: CRITICAL  RESULT CALLED TO, READ BACK BY AND VERIFIED WITH ALFRED DILLARD RN AT 2044  ON 11/11/22 BY I.[email or web address] Performed at [facility], [address] [place], [place] [address]   CBG monitoring, ED     Status: Abnormal    Collection Time: 11/12/2022  8:12 PM  Result Value Ref Range   Glucose-Capillary 511 (HH) 70 - 99 mg/dL    Comment: Glucose reference range applies only to samples taken after fasting for at least 8 hours.   Comment 1 Notify RN   I-Stat venous blood gas, ED     Status: Abnormal   Collection Time: 10/30/2022  8:24 PM  Result Value Ref Range   pH, Ven 7.283 7.25 - 7.43   pCO2, Ven 33.5 (L) 44 - 60 mmHg   pO2, Ven 38 32 - 45 mmHg   Bicarbonate 15.9 (L) 20.0 - 28.0 mmol/L   TCO2 17 (L) 22 - 32 mmol/L   O2 Saturation 67 %   Acid-base deficit 10.0 (H) 0.0 - 2.0 mmol/L   Sodium 126 (L) 135 - 145 mmol/L   Potassium 3.6 3.5 - 5.1 mmol/L   Calcium, Ion 1.08 (L) 1.15 - 1.40 mmol/L   HCT 34.0 (L) 36.0 - 46.0 %   Hemoglobin 11.6 (L) 12.0 - 15.0 g/dL   Patient temperature 33.3 F    Collection site IV start    Drawn by Nurse    Sample type VENOUS    Comment NOTIFIED PHYSICIAN   Basic metabolic panel     Status: Abnormal   Collection Time: 10/31/2022  8:32 PM  Result Value Ref Range   Sodium 125 (L) 135 - 145 mmol/L   Potassium 3.4 (L) 3.5 - 5.1 mmol/L   Chloride 95 (L) 98 - 111 mmol/L   CO2 16 (L) 22 - 32 mmol/L   Glucose, Bld 531 (HH) 70 - 99 mg/dL    Comment: CRITICAL RESULT CALLED TO, READ BACK BY AND VERIFIED WITH RODDEN RANETTE RN AT 2133 ON 11/02/2022 BY I.[email or web address] Glucose reference range applies only to samples taken after fasting for at least 8 hours.    BUN 30 (H) 6 - 20 mg/dL   Creatinine, Ser 5.45 (H) 0.44 - 1.00 mg/dL   Calcium 7.5 (L) 8.9 - 10.3 mg/dL   GFR, Estimated >62 >56 mL/min    Comment: (NOTE) Calculated using the CKD-EPI Creatinine Equation (2021)    Anion gap 14 5 - 15    Comment: Performed at [facility], [address] [place], [place] [address]  CBG monitoring, ED     Status: Abnormal   Collection Time: 11/09/2022  8:54 PM  Result Value Ref Range   Glucose-Capillary 552 (HH) 70 - 99 mg/dL    Comment: Glucose reference range applies only to samples taken  after fasting for at least 8 hours.   Comment 1 Notify RN   Urinalysis, Routine w reflex microscopic Urine, Bag (ped)     Status: Abnormal   Collection Time: 11/11/2022  8:59 PM  Result Value Ref Range   Color, Urine YELLOW YELLOW   APPearance CLOUDY (A) CLEAR   Specific Gravity, Urine <=1.005 1.005 - 1.030   pH 5.5 5.0 - 8.0   Glucose, UA >=500 (A) NEGATIVE mg/dL   Hgb urine dipstick MODERATE (A) NEGATIVE   Bilirubin Urine NEGATIVE NEGATIVE   Ketones, ur 15 (A) NEGATIVE mg/dL   Protein, ur NEGATIVE NEGATIVE mg/dL   Nitrite NEGATIVE NEGATIVE   Leukocytes,Ua TRACE (A)  NEGATIVE    Comment: Performed at [facility], [address] [place], [place] [address]  Urinalysis, Microscopic (reflex)     Status: Abnormal   Collection Time: 10/29/2022  8:59 PM  Result Value Ref Range   RBC / HPF 11-20 0 - 5 RBC/hpf   WBC, UA 11-20 0 - 5 WBC/hpf   Bacteria, UA FEW (A) Lindsey [name] SEEN   Squamous Epithelial / HPF 6-10 0 - 5 /HPF   Hyphae Yeast PRESENT     Comment: Performed at [facility], [address] [place], [place] [address]  CBG monitoring, ED     Status: Abnormal   Collection Time: 11/06/2022  9:29 PM  Result Value Ref Range   Glucose-Capillary 433 (H) 70 - 99 mg/dL    Comment: Glucose reference range applies only to samples taken after fasting for at least 8 hours.  CBG monitoring, ED     Status: Abnormal   Collection Time: 11/21/2022 10:27 PM  Result Value Ref Range   Glucose-Capillary 356 (H) 70 - 99 mg/dL    Comment: Glucose reference range applies only to samples taken after fasting for at least 8 hours.    CT ABDOMEN PELVIS W CONTRAST  Result Date: 11/08/2022 CLINICAL DATA:  Elevated blood sugar. EXAM: CT ABDOMEN AND PELVIS WITH CONTRAST TECHNIQUE: Multidetector CT imaging of the abdomen and pelvis was performed using the standard protocol following bolus administration of intravenous contrast. RADIATION DOSE REDUCTION: This exam was performed according to the  departmental dose-optimization program which includes automated exposure control, adjustment of the mA and/or kV according to patient size and/or use of iterative reconstruction technique. CONTRAST:  OMNIPAQUE IOHEXOL 350 MG/ML SOLN COMPARISON:  Lindsey [name] Available. FINDINGS: Lower chest: Mild linear atelectasis is seen within the left lung base. Hepatobiliary: There is diffuse fatty infiltration of the liver parenchyma. No focal liver abnormality is seen. The gallbladder is contracted. No gallstones, gallbladder wall thickening, or biliary dilatation. Pancreas: Unremarkable. No pancreatic ductal dilatation or surrounding inflammatory changes. Spleen: Normal in size without focal abnormality. Adrenals/Urinary Tract: Adrenal glands are unremarkable. Kidneys are normal, without renal calculi, focal lesion, or hydronephrosis. Bladder is unremarkable. Stomach/Bowel: Stomach is within normal limits. Appendix appears normal. No evidence of bowel wall thickening, distention, or inflammatory changes. Vascular/Lymphatic: No significant vascular findings are present. No enlarged abdominal or pelvic lymph nodes. Reproductive: Uterus and bilateral adnexa are unremarkable. Other: No abdominal wall hernia. A mild-to-moderate amount of subcutaneous and para muscular inflammatory fat stranding is seen along the anterolateral aspect of the abdominal wall on the left. No abdominopelvic ascites. Musculoskeletal: A large amount of soft tissue air is seen along the perineum on the right and within the medial and posteromedial aspects of the right gluteal region. A moderate amount of associated inflammatory fat stranding is also seen. No acute osseous abnormalities are identified. IMPRESSION: 1. 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 2. Hepatic steatosis. Electronically Signed   By: Aram Candela M.D.   On:  11/13/2022 20:11   CT Angio Chest PE W and/or Wo Contrast  Result Date: 10/28/2022 CLINICAL DATA:  Hypoxia and shortness of breath. EXAM: CT ANGIOGRAPHY CHEST WITH CONTRAST TECHNIQUE: Multidetector CT imaging of the chest was performed using the standard protocol during bolus administration of intravenous contrast. Multiplanar CT image reconstructions and MIPs were obtained to evaluate the vascular anatomy. RADIATION DOSE REDUCTION: This exam  was performed according to the departmental dose-optimization program which includes automated exposure control, adjustment of the mA and/or kV according to patient size and/or use of iterative reconstruction technique. CONTRAST:  OMNIPAQUE IOHEXOL 350 MG/ML SOLN COMPARISON:  Lindsey [name] Available. FINDINGS: Cardiovascular: The thoracic aorta is normal in appearance. Satisfactory opacification of the pulmonary arteries to the segmental level. No evidence of pulmonary embolism. There is mild cardiomegaly. No pericardial effusion. Mediastinum/Nodes: No enlarged mediastinal, hilar, or axillary lymph nodes. Thyroid gland, trachea, and esophagus demonstrate no significant findings. Lungs/Pleura: Mild lingular and left basilar linear atelectasis is seen. There is no evidence of focal consolidation, pleural effusion or pneumothorax. Upper Abdomen: There is diffuse fatty infiltration of the liver parenchyma. Musculoskeletal: No chest wall abnormality. No acute or significant osseous findings. Review of the MIP images confirms the above findings. IMPRESSION: 1. No evidence of pulmonary embolism or other acute intrathoracic process. 2. Mild lingular and left basilar linear atelectasis. 3. Hepatic steatosis. Electronically Signed   By: Aram Candela M.D.   On: 20-Nov-2022 19:56   DG Chest 1 View  Result Date: 11-20-2022 CLINICAL DATA:  Shortness of breath. EXAM: CHEST  1 VIEW COMPARISON:  03/29/2015 FINDINGS: Limited detail due to patient's size and portable AP technique. Mild  cardiomegaly. The lung apices are clear. No visible pleural effusion. Consider two-view chest radiography if concern persists. IMPRESSION: Limited detail due to patient's size and portable AP technique. Mild cardiomegaly. No visible pleural effusion. Electronically Signed   By: Paulina Fusi M.D.   On: 11-20-22 18:28    Review of Systems  Constitutional:  Positive for chills, fatigue and fever.  HENT:  Negative for ear discharge, hearing loss and sore throat.   Eyes:  Negative for discharge.  Respiratory:  Negative for cough and shortness of breath.   Cardiovascular:  Negative for chest pain and leg swelling.  Gastrointestinal:  Negative for abdominal pain, constipation, diarrhea, nausea and vomiting.  Musculoskeletal:  Negative for myalgias and neck pain.  Skin:  Negative for rash.  Allergic/Immunologic: Negative for environmental allergies.  Neurological:  Negative for dizziness and seizures.  Hematological:  Does not bruise/bleed easily.  Psychiatric/Behavioral:  Negative for suicidal ideas.   All other systems reviewed and are negative.  Blood pressure (!) 123/53, pulse 96, temperature 98.8 F (37.1 C), resp. rate (!) 25, height 5\' 6"  (1.676 m), weight (!) 201.9 kg, SpO2 97 %. Physical Exam Constitutional:      Appearance: She is well-developed. She is obese.     Comments: Conversant No acute distress  HENT:     Head: Normocephalic and atraumatic.  Eyes:     General: Lids are normal. No scleral icterus.    Pupils: Pupils are equal, round, and reactive to light.     Comments: Pupils are equal round and reactive No lid lag Moist conjunctiva  Neck:     Thyroid: No thyromegaly.     Trachea: No tracheal tenderness.     Comments: No cervical lymphadenopathy Cardiovascular:     Rate and Rhythm: Normal rate and regular rhythm.     Heart sounds: No murmur heard. Pulmonary:     Effort: Pulmonary effort is normal.     Breath sounds: Normal breath sounds. No wheezing or rales.   Abdominal:     Tenderness: There is no abdominal tenderness.     Hernia: No hernia is present.  Genitourinary:      Comments: Large area of discolored soft tissue with pain on palp Musculoskeletal:  Cervical back: Normal range of motion and neck supple.  Skin:    General: Skin is warm.     Findings: No rash.     Nails: There is no clubbing.     Comments: Normal skin turgor  Neurological:     Mental Status: She is alert and oriented to person, place, and time.     Comments: Normal gait and station  Psychiatric:        Mood and Affect: Mood normal.        Thought Content: Thought content normal.        Judgment: Judgment normal.     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 AFib RVR Hyperglycemia with DKA Sepsis     Will plan on emergent OR for debridement of infected tissue. I d/w the pt the risks and benefits of the procedure to include but not limited to infection, bleeding, damage to surrounding structures.  I d/w her that she will likely stay intubated and will require several debridements in the OR.  PT voices agreement and wishes to proceed.  High medical decision making   Ralene Ok 10/27/2022, 10:36 PM

## 2022-11-01 NOTE — ED Provider Notes (Signed)
[facility] EMERGENCY DEPARTMENT Provider Note   CSN: [id number] Arrival date & time: 11/02/2022  1731     History  Chief Complaint  Patient presents with   Shortness of Breath    Lindsey [name] is a 37 y.o. female.  36 year old female with a history of DM2, HTN, and hypothyroidism who presents emergency department with elevated blood sugar.  Patient reports that for the past 4 to 5 days she has had wounds on her buttocks that have been getting worse.  Says that they are very painful at this time.  Says that she has also been treated for a urinary tract infection recently with an antibiotic.  Says that today she took her blood sugar for the first time in months because she had a presyncopal episode and noticed that it was severely elevated.  Also reports increased thirst and shortness of breath.  Denies any chest pain.  Was noted to be hypoxic in triage she was placed on 2 L nasal cannula.  Does not take insulin but does take pioglitazone and glimepiride for her blood sugar.       Home Medications Prior to Admission medications   Medication Sig Start Date End Date Taking? Authorizing Provider  acetaZOLAMIDE ER (DIAMOX) 500 MG capsule Take 1 capsule (500 mg total) by mouth in the morning, at noon, and at bedtime. Patient taking differently: Take 500 mg by mouth 2 (two) times daily. 06/21/21 07/21/21  Windell Norfolk, MD  ALPRAZolam Prudy Feeler) 0.25 MG tablet Take 0.25 mg by mouth daily. 06/17/21   [provider]  ASHWAGANDHA PO Take 1,300 mg by mouth daily.    [provider]  B Complex CAPS See admin instructions.    [provider]  glimepiride (AMARYL) 2 MG tablet Take 2 mg by mouth daily. 05/29/21   [provider]  irbesartan (AVAPRO) 300 MG tablet Take 300 mg by mouth daily. 05/31/21   [provider]  levothyroxine (SYNTHROID) 50 MCG tablet Take 50 mcg by mouth daily. 06/02/21   [provider]  LORazepam (ATIVAN) 1 MG tablet  Take 1 mg by mouth 2 (two) times daily. Patient not taking: Reported on 03/09/2022 02/11/22   [provider]  Multiple Vitamin (MULTIVITAMIN) tablet Take 1 tablet by mouth daily.    [provider]  pioglitazone (ACTOS) 45 MG tablet Take 45 mg by mouth daily. 02/18/21   [provider]  pravastatin (PRAVACHOL) 20 MG tablet Take 20 mg by mouth daily. 05/29/21   [provider]  propranolol (INDERAL) 10 MG tablet Take 1 tablet (10 mg total) by mouth daily. 03/28/22 04/27/22  Oneta Rack, NP  QUEtiapine (SEROQUEL) 50 MG tablet Take 50 mg by mouth at bedtime. 01/17/22   [provider]  venlafaxine (EFFEXOR) 75 MG tablet Take 1 tablet (75 mg total) by mouth at bedtime AND 2 tablets (150 mg total) daily. 03/28/22 04/27/22  Oneta Rack, NP      Allergies    Bupropion, Anti-inflammatory enzyme [nutritional supplements], Hydroxyzine, Metformin, Metformin hcl er, Nsaids, and Rosuvastatin    Review of Systems   Review of Systems  Physical Exam Updated Vital Signs BP 127/62   Pulse (!) 141   Temp 98.8 F (37.1 C)   Resp (!) 39   Ht 5\' 6"  (1.676 m)   Wt (!) 201.9 kg   SpO2 98%   BMI 71.82 kg/m  Physical Exam Vitals and nursing note reviewed.  Constitutional:      General: She  is in acute distress.     Appearance: She is well-developed. She is ill-appearing.  HENT:     Head: Normocephalic and atraumatic.     Right Ear: External ear normal.     Left Ear: External ear normal.     Nose: Nose normal.  Eyes:     Extraocular Movements: Extraocular movements intact.     Conjunctiva/sclera: Conjunctivae normal.     Pupils: Pupils are equal, round, and reactive to light.  Cardiovascular:     Rate and Rhythm: Regular rhythm. Tachycardia present.     Heart sounds: No murmur heard.    Comments: Initially sinus tachycardia.  Later converted to atrial fibrillation with RVR. Pulmonary:     Effort: Pulmonary effort is normal.     Breath sounds: Normal breath  sounds. No wheezing or rales.     Comments: On 2 L nasal cannula.  Tachypneic. Abdominal:     General: Abdomen is flat. There is no distension.     Palpations: Abdomen is soft. There is no mass.     Tenderness: There is no abdominal tenderness. There is no guarding.  Musculoskeletal:     Cervical back: Normal range of motion and neck supple.     Right lower leg: No edema.     Left lower leg: No edema.     Comments: Extensive cellulitis of the right gluteus.  Does have wound that appears to track deeply in the right gluteal cleft.  Erythema and induration noted.  No crepitance noted.  No cellulitis of the perineum anteriorly noted on chaperoned exam.  Skin:    General: Skin is warm and dry.     Findings: Erythema and rash present.  Neurological:     Mental Status: She is alert and oriented to person, place, and time. Mental status is at baseline.  Psychiatric:        Mood and Affect: Mood normal.     Buttocks:      ED Results / Procedures / Treatments   Labs (all labs ordered are listed, but only abnormal results are displayed) Labs Reviewed  BASIC METABOLIC PANEL - Abnormal; Notable for the following components:      Result Value   Sodium 121 (*)    Potassium 3.3 (*)    Chloride 88 (*)    CO2 15 (*)    Glucose, Bld 596 (*)    BUN 33 (*)    Creatinine, Ser 1.27 (*)    Calcium 8.4 (*)    GFR, Estimated 56 (*)    Anion gap 18 (*)    All other components within normal limits  CBC WITH DIFFERENTIAL/PLATELET - Abnormal; Notable for the following components:   WBC 22.8 (*)    Hemoglobin 11.3 (*)    HCT 34.0 (*)    Neutro Abs 18.4 (*)    Monocytes Absolute 1.8 (*)    Abs Immature Granulocytes 0.94 (*)    All other components within normal limits  LACTIC ACID, PLASMA - Abnormal; Notable for the following components:   Lactic Acid, Venous 3.5 (*)    All other components within normal limits  LACTIC ACID, PLASMA - Abnormal; Notable for the following components:   Lactic  Acid, Venous 2.9 (*)    All other components within normal limits  CBG MONITORING, ED - Abnormal; Notable for the following components:   Glucose-Capillary 583 (*)    All other components within normal limits  I-STAT VENOUS BLOOD GAS, ED - Abnormal; Notable for the  following components:   pCO2, Ven 30.3 (*)    pO2, Ven 46 (*)    Bicarbonate 15.4 (*)    TCO2 16 (*)    Acid-base deficit 10.0 (*)    Sodium 124 (*)    Calcium, Ion 1.10 (*)    All other components within normal limits  CBG MONITORING, ED - Abnormal; Notable for the following components:   Glucose-Capillary 511 (*)    All other components within normal limits  I-STAT VENOUS BLOOD GAS, ED - Abnormal; Notable for the following components:   pCO2, Ven 33.5 (*)    Bicarbonate 15.9 (*)    TCO2 17 (*)    Acid-base deficit 10.0 (*)    Sodium 126 (*)    Calcium, Ion 1.08 (*)    HCT 34.0 (*)    Hemoglobin 11.6 (*)    All other components within normal limits  CBG MONITORING, ED - Abnormal; Notable for the following components:   Glucose-Capillary 552 (*)    All other components within normal limits  RESP PANEL BY RT-PCR (RSV, FLU A&B, COVID)  RVPGX2  URINE CULTURE  CULTURE, BLOOD (ROUTINE X 2)  CULTURE, BLOOD (ROUTINE X 2)  HCG, SERUM, QUALITATIVE  BASIC METABOLIC PANEL  BASIC METABOLIC PANEL  BASIC METABOLIC PANEL  BETA-HYDROXYBUTYRIC ACID  BETA-HYDROXYBUTYRIC ACID  URINALYSIS, ROUTINE W REFLEX MICROSCOPIC  OSMOLALITY  BASIC METABOLIC PANEL  BASIC METABOLIC PANEL  BASIC METABOLIC PANEL  BASIC METABOLIC PANEL  BASIC METABOLIC PANEL  BASIC METABOLIC PANEL  BASIC METABOLIC PANEL  BLOOD GAS, VENOUS  BLOOD GAS, VENOUS  BLOOD GAS, VENOUS  BLOOD GAS, VENOUS  BLOOD GAS, VENOUS  BLOOD GAS, VENOUS  BLOOD GAS, VENOUS    EKG EKG Interpretation  Date/Time:  Tuesday November 01 2022 17:45:02 EST Ventricular Rate:  112 PR Interval:  130 QRS Duration: 92 QT Interval:  370 QTC Calculation: 506 R Axis:   20 Text  Interpretation: Sinus tachycardia Low voltage, precordial leads Borderline T wave abnormalities Borderline prolonged QT interval Confirmed by Vonita Moss [phone]) on 11/11/2022 6:02:54 PM  Radiology CT ABDOMEN PELVIS W CONTRAST  Result Date: 11/17/2022 CLINICAL DATA:  Elevated blood sugar. EXAM: CT ABDOMEN AND PELVIS WITH CONTRAST TECHNIQUE: Multidetector CT imaging of the abdomen and pelvis was performed using the standard protocol following bolus administration of intravenous contrast. RADIATION DOSE REDUCTION: This exam was performed according to the departmental dose-optimization program which includes automated exposure control, adjustment of the mA and/or kV according to patient size and/or use of iterative reconstruction technique. CONTRAST:  OMNIPAQUE IOHEXOL 350 MG/ML SOLN COMPARISON:  None Available. FINDINGS: Lower chest: Mild linear atelectasis is seen within the left lung base. Hepatobiliary: There is diffuse fatty infiltration of the liver parenchyma. No focal liver abnormality is seen. The gallbladder is contracted. No gallstones, gallbladder wall thickening, or biliary dilatation. Pancreas: Unremarkable. No pancreatic ductal dilatation or surrounding inflammatory changes. Spleen: Normal in size without focal abnormality. Adrenals/Urinary Tract: Adrenal glands are unremarkable. Kidneys are normal, without renal calculi, focal lesion, or hydronephrosis. Bladder is unremarkable. Stomach/Bowel: Stomach is within normal limits. Appendix appears normal. No evidence of bowel wall thickening, distention, or inflammatory changes. Vascular/Lymphatic: No significant vascular findings are present. No enlarged abdominal or pelvic lymph nodes. Reproductive: Uterus and bilateral adnexa are unremarkable. Other: No abdominal wall hernia. A mild-to-moderate amount of subcutaneous and para muscular inflammatory fat stranding is seen along the anterolateral aspect of the abdominal wall on the left. No  abdominopelvic ascites. Musculoskeletal: A large amount of  soft tissue air is seen along the perineum on the right and within the medial and posteromedial aspects of the right gluteal region. A moderate amount of associated inflammatory fat stranding is also seen. No acute osseous abnormalities are identified. IMPRESSION: 1. 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 2. Hepatic steatosis. Electronically Signed   By: Aram Candela M.D.   On: 10/25/2022 20:11   CT Angio Chest PE W and/or Wo Contrast  Result Date: 10/28/2022 CLINICAL DATA:  Hypoxia and shortness of breath. EXAM: CT ANGIOGRAPHY CHEST WITH CONTRAST TECHNIQUE: Multidetector CT imaging of the chest was performed using the standard protocol during bolus administration of intravenous contrast. Multiplanar CT image reconstructions and MIPs were obtained to evaluate the vascular anatomy. RADIATION DOSE REDUCTION: This exam was performed according to the departmental dose-optimization program which includes automated exposure control, adjustment of the mA and/or kV according to patient size and/or use of iterative reconstruction technique. CONTRAST:  OMNIPAQUE IOHEXOL 350 MG/ML SOLN COMPARISON:  None Available. FINDINGS: Cardiovascular: The thoracic aorta is normal in appearance. Satisfactory opacification of the pulmonary arteries to the segmental level. No evidence of pulmonary embolism. There is mild cardiomegaly. No pericardial effusion. Mediastinum/Nodes: No enlarged mediastinal, hilar, or axillary lymph nodes. Thyroid gland, trachea, and esophagus demonstrate no significant findings. Lungs/Pleura: Mild lingular and left basilar linear atelectasis is seen. There is no evidence of focal consolidation, pleural effusion or pneumothorax. Upper Abdomen: There is diffuse fatty infiltration of the liver parenchyma. Musculoskeletal: No  chest wall abnormality. No acute or significant osseous findings. Review of the MIP images confirms the above findings. IMPRESSION: 1. No evidence of pulmonary embolism or other acute intrathoracic process. 2. Mild lingular and left basilar linear atelectasis. 3. Hepatic steatosis. Electronically Signed   By: Aram Candela M.D.   On: 11/21/2022 19:56   DG Chest 1 View  Result Date: 10/30/2022 CLINICAL DATA:  Shortness of breath. EXAM: CHEST  1 VIEW COMPARISON:  03/29/2015 FINDINGS: Limited detail due to patient's size and portable AP technique. Mild cardiomegaly. The lung apices are clear. No visible pleural effusion. Consider two-view chest radiography if concern persists. IMPRESSION: Limited detail due to patient's size and portable AP technique. Mild cardiomegaly. No visible pleural effusion. Electronically Signed   By: Paulina Fusi M.D.   On: 11/04/2022 18:28    Procedures Procedures   Medications Ordered in ED Medications  metroNIDAZOLE (FLAGYL) IVPB 500 mg (500 mg Intravenous New Bag/Given 10/24/2022 2029)  vancomycin (VANCOCIN) IVPB 1000 mg/200 mL premix (has no administration in time range)    Followed by  vancomycin (VANCOCIN) IVPB 1000 mg/200 mL premix (has no administration in time range)  sodium chloride 0.9 % bolus 1,000 mL (has no administration in time range)  lactated ringers bolus 1,000 mL (has no administration in time range)  insulin regular, human (MYXREDLIN) 100 units/ 100 mL infusion (20 Units/hr Intravenous Rate/Dose Change 10/25/2022 2055)  lactated ringers infusion (has no administration in time range)  dextrose 5 % in lactated ringers infusion (has no administration in time range)  dextrose 50 % solution 0-50 mL (has no administration in time range)  potassium chloride 10 mEq in 100 mL IVPB (10 mEq Intravenous New Bag/Given 11/08/2022 2049)  amiodarone (NEXTERONE) 1.8 mg/mL load via infusion 150 mg (150 mg Intravenous Bolus from Bag 11/21/2022 2027)    Followed by  amiodarone  (NEXTERONE PREMIX) 360-4.14 MG/200ML-% (1.8 mg/mL) IV infusion (60  mg/hr Intravenous New Bag/Given 11/16/2022 2045)    Followed by  amiodarone (NEXTERONE PREMIX) 360-4.14 MG/200ML-% (1.8 mg/mL) IV infusion (has no administration in time range)  amiodarone (NEXTERONE) 1.8 mg/mL load via infusion 150 mg (has no administration in time range)  lactated ringers bolus 1,000 mL (0 mLs Intravenous Stopped 11/04/2022 2021)  ceFEPIme (MAXIPIME) 2 g in sodium chloride 0.9 % 100 mL IVPB (0 g Intravenous Stopped 10/29/2022 2016)  iohexol (OMNIPAQUE) 350 MG/ML injection 125 mL (125 mLs Intravenous Contrast Given 11/14/2022 1910)  amiodarone (NEXTERONE) 150-4.21 MG/100ML-% bolus (0 mg  Stopped 10/29/2022 2055)    ED Course/ Medical Decision Making/ A&P Clinical Course as of 11/18/2022 2112  Tue Nov 01, 2022  2003 Dr Rubin Payor from [facility] accepts for ED to ED transfer.  [RP]  2046 Spoke with Dr Derrell Lolling from general surgery who will see the patient for her Fournier's gangrene when she gets to [facility].  Did also discussed with Dr. Merrily Pew from critical care who will evaluate the patient in the emergency department and see if she requires ICU or progressive unit. [RP]  2111 [facility] has arrived to transport patient to [facility].  [RP]    Clinical Course User Index [RP] Rondel Baton, MD                           Medical Decision Making Amount and/or Complexity of Data Reviewed Labs: ordered. Radiology: ordered.  Risk Prescription drug management. Decision regarding hospitalization.   Tisheena Maguire is a 37 y.o. female with comorbidities that complicate the patient evaluation including DM2, HTN, and hypothyroidism who presents emergency department with elevated blood sugar and rash  Initial Ddx:  Cellulitis, Fournier's gangrene, DKA, HHS, hypoxia, pneumonia, COVID, PE  MDM:  Initially concerned about Fournier's gangrene for the patient.  Was started on broad-spectrum antibiotics.  Also labs to assess for DKA and  HHS.  With her hypoxia was initially given a bolus of fluids and will obtain CT scan to assess for pneumonia as well as PE along with CT of the abdomen pelvis to assess for Fournier's gangrene.  Plan:  Labs VBG BHB Blood cultures Broad-spectrum antibiotics CTA chest CT abdomen pelvis  ED Summary/Re-evaluation:  Patient reevaluated and went from sinus tachycardia to atrial fibrillation RVR.  No history of A-fib RVR.  She did have soft blood pressures that prevented [name] from giving her rate limiting medications at this time.  Given the patient's labs that did show mild DKA with metabolic acidosis is concerned about sedation for cardioversion worsening her acidosis so give the patient amiodarone bolus x2 and infusion.  Did have mild improvement of her heart rate from the 160s to 120s.  Will hold off on anticoagulation since she will likely have to undergo debridement for Fournier's gangrene.  Reached out to general surgery who will evaluate the patient when she gets to [facility].  Also discussed with the ICU who will evaluate the patient when she is transported to the emergency department.  She was also given 3 L of fluids, potassium, and started on insulin bolus as well for her DKA.  This patient presents to the ED for concern of complaints listed in HPI, this involves an extensive number of treatment options, and is a complaint that carries with it a high risk of complications and morbidity. Disposition including potential need for admission considered.   Dispo: Transfer to [facility] ED  Additional history obtained from mother Records  reviewed Outpatient Clinic Notes The following labs were independently interpreted: Chemistry and VBG and show anion gap metabolic acidosis I independently reviewed the following imaging with scope of interpretation limited to determining acute life threatening conditions related to emergency care: CT Chest and CT Abdomen/Pelvis and agree with the radiologist  interpretation with the following exceptions: none I personally reviewed and interpreted cardiac monitoring: sinus tachycardia and atrial fibrillation with RVR I personally reviewed and interpreted the pt's EKG: see above for interpretation  I have reviewed the patients home medications and made adjustments as needed Consults: Critical care and General Surgery  Final Clinical Impression(s) / ED Diagnoses Final diagnoses:  Severe sepsis ([place])  Atrial fibrillation with RVR ([place])  Fournier's gangrene in female [facility])  Diabetic ketoacidosis without coma associated with type 2 diabetes mellitus ([place])    Rx / DC Orders ED Discharge Orders     None      CRITICAL CARE Performed by: Fransico Meadow   Total critical care time: 120 minutes  Critical care time was exclusive of separately billable procedures and treating other patients.  Critical care was necessary to treat or prevent imminent or life-threatening deterioration.  Critical care was time spent personally by me on the following activities: development of treatment plan with patient and/or surrogate as well as nursing, discussions with consultants, evaluation of patient's response to treatment, examination of patient, obtaining history from patient or surrogate, ordering and performing treatments and interventions, ordering and review of laboratory studies, ordering and review of radiographic studies, pulse oximetry and re-evaluation of patient's condition.    Fransico Meadow, MD 11/12/2022 2112

## 2022-11-02 ENCOUNTER — Other Ambulatory Visit: Payer: Self-pay

## 2022-11-02 ENCOUNTER — Inpatient Hospital Stay (HOSPITAL_COMMUNITY): Payer: Medicaid Other

## 2022-11-02 ENCOUNTER — Encounter (HOSPITAL_COMMUNITY): Payer: Self-pay | Admitting: General Surgery

## 2022-11-02 DIAGNOSIS — J9600 Acute respiratory failure, unspecified whether with hypoxia or hypercapnia: Secondary | ICD-10-CM

## 2022-11-02 DIAGNOSIS — R6521 Severe sepsis with septic shock: Secondary | ICD-10-CM

## 2022-11-02 LAB — BASIC METABOLIC PANEL
Anion gap: 10 (ref 5–15)
Anion gap: 12 (ref 5–15)
Anion gap: 10 (ref 5–15)
BUN: 28 mg/dL — ABNORMAL HIGH (ref 6–20)
BUN: 29 mg/dL — ABNORMAL HIGH (ref 6–20)
BUN: 30 mg/dL — ABNORMAL HIGH (ref 6–20)
CO2: 19 mmol/L — ABNORMAL LOW (ref 22–32)
CO2: 20 mmol/L — ABNORMAL LOW (ref 22–32)
CO2: 21 mmol/L — ABNORMAL LOW (ref 22–32)
Calcium: 7.2 mg/dL — ABNORMAL LOW (ref 8.9–10.3)
Calcium: 7.3 mg/dL — ABNORMAL LOW (ref 8.9–10.3)
Calcium: 6.8 mg/dL — ABNORMAL LOW (ref 8.9–10.3)
Chloride: 96 mmol/L — ABNORMAL LOW (ref 98–111)
Chloride: 96 mmol/L — ABNORMAL LOW (ref 98–111)
Chloride: 99 mmol/L (ref 98–111)
Creatinine, Ser: 1.34 mg/dL — ABNORMAL HIGH (ref 0.44–1.00)
Creatinine, Ser: 1.44 mg/dL — ABNORMAL HIGH (ref 0.44–1.00)
Creatinine, Ser: 1.78 mg/dL — ABNORMAL HIGH (ref 0.44–1.00)
GFR, Estimated: 53 mL/min — ABNORMAL LOW (ref >60)
GFR, Estimated: 48 mL/min — ABNORMAL LOW (ref >60)
GFR, Estimated: 37 mL/min — ABNORMAL LOW (ref >60)
Glucose, Bld: 316 mg/dL — ABNORMAL HIGH (ref 70–99)
Glucose, Bld: 262 mg/dL — ABNORMAL HIGH (ref 70–99)
Glucose, Bld: 192 mg/dL — ABNORMAL HIGH (ref 70–99)
Potassium: 3.2 mmol/L — ABNORMAL LOW (ref 3.5–5.1)
Potassium: 3.4 mmol/L — ABNORMAL LOW (ref 3.5–5.1)
Potassium: 3.4 mmol/L — ABNORMAL LOW (ref 3.5–5.1)
Sodium: 125 mmol/L — ABNORMAL LOW (ref 135–145)
Sodium: 128 mmol/L — ABNORMAL LOW (ref 135–145)
Sodium: 130 mmol/L — ABNORMAL LOW (ref 135–145)

## 2022-11-02 LAB — POCT I-STAT 7, (LYTES, BLD GAS, ICA,H+H)
Acid-base deficit: 9 mmol/L — ABNORMAL HIGH (ref 0.0–2.0)
Acid-base deficit: 10 mmol/L — ABNORMAL HIGH (ref 0.0–2.0)
Acid-base deficit: 7 mmol/L — ABNORMAL HIGH (ref 0.0–2.0)
Bicarbonate: 21.3 mmol/L (ref 20.0–28.0)
Bicarbonate: 20.4 mmol/L (ref 20.0–28.0)
Bicarbonate: 21.5 mmol/L (ref 20.0–28.0)
Calcium, Ion: 1.09 mmol/L — ABNORMAL LOW (ref 1.15–1.40)
Calcium, Ion: 1.03 mmol/L — ABNORMAL LOW (ref 1.15–1.40)
Calcium, Ion: 0.97 mmol/L — ABNORMAL LOW (ref 1.15–1.40)
HCT: 35 % — ABNORMAL LOW (ref 36.0–46.0)
HCT: 34 % — ABNORMAL LOW (ref 36.0–46.0)
HCT: 34 % — ABNORMAL LOW (ref 36.0–46.0)
Hemoglobin: 11.9 g/dL — ABNORMAL LOW (ref 12.0–15.0)
Hemoglobin: 11.6 g/dL — ABNORMAL LOW (ref 12.0–15.0)
Hemoglobin: 11.6 g/dL — ABNORMAL LOW (ref 12.0–15.0)
O2 Saturation: 91 %
O2 Saturation: 93 %
O2 Saturation: 99 %
Patient temperature: 98
Patient temperature: 98
Patient temperature: 99.1
Potassium: 3.5 mmol/L (ref 3.5–5.1)
Potassium: 3.4 mmol/L — ABNORMAL LOW (ref 3.5–5.1)
Potassium: 3.7 mmol/L (ref 3.5–5.1)
Sodium: 131 mmol/L — ABNORMAL LOW (ref 135–145)
Sodium: 132 mmol/L — ABNORMAL LOW (ref 135–145)
Sodium: 132 mmol/L — ABNORMAL LOW (ref 135–145)
TCO2: 23 mmol/L (ref 22–32)
TCO2: 23 mmol/L (ref 22–32)
TCO2: 23 mmol/L (ref 22–32)
pCO2 arterial: 70.2 mmHg (ref 32–48)
pCO2 arterial: 68.7 mmHg (ref 32–48)
pCO2 arterial: 57 mmHg — ABNORMAL HIGH (ref 32–48)
pH, Arterial: 7.088 — CL (ref 7.35–7.45)
pH, Arterial: 7.079 — CL (ref 7.35–7.45)
pH, Arterial: 7.187 — CL (ref 7.35–7.45)
pO2, Arterial: 82 mmHg — ABNORMAL LOW (ref 83–108)
pO2, Arterial: 91 mmHg (ref 83–108)
pO2, Arterial: 162 mmHg — ABNORMAL HIGH (ref 83–108)

## 2022-11-02 LAB — URINE CULTURE: Culture: 70000 — AB

## 2022-11-02 LAB — HEMOGLOBIN A1C
Hgb A1c MFr Bld: 14.7 % — ABNORMAL HIGH (ref 4.8–5.6)
Mean Plasma Glucose: 375.19 mg/dL

## 2022-11-02 LAB — GLUCOSE, CAPILLARY
Glucose-Capillary: 349 mg/dL — ABNORMAL HIGH (ref 70–99)
Glucose-Capillary: 328 mg/dL — ABNORMAL HIGH (ref 70–99)
Glucose-Capillary: 300 mg/dL — ABNORMAL HIGH (ref 70–99)
Glucose-Capillary: 257 mg/dL — ABNORMAL HIGH (ref 70–99)
Glucose-Capillary: 255 mg/dL — ABNORMAL HIGH (ref 70–99)
Glucose-Capillary: 210 mg/dL — ABNORMAL HIGH (ref 70–99)
Glucose-Capillary: 206 mg/dL — ABNORMAL HIGH (ref 70–99)
Glucose-Capillary: 201 mg/dL — ABNORMAL HIGH (ref 70–99)
Glucose-Capillary: 177 mg/dL — ABNORMAL HIGH (ref 70–99)
Glucose-Capillary: 176 mg/dL — ABNORMAL HIGH (ref 70–99)
Glucose-Capillary: 166 mg/dL — ABNORMAL HIGH (ref 70–99)
Glucose-Capillary: 165 mg/dL — ABNORMAL HIGH (ref 70–99)
Glucose-Capillary: 180 mg/dL — ABNORMAL HIGH (ref 70–99)
Glucose-Capillary: 187 mg/dL — ABNORMAL HIGH (ref 70–99)
Glucose-Capillary: 188 mg/dL — ABNORMAL HIGH (ref 70–99)
Glucose-Capillary: 170 mg/dL — ABNORMAL HIGH (ref 70–99)
Glucose-Capillary: 299 mg/dL — ABNORMAL HIGH (ref 70–99)

## 2022-11-02 LAB — CBC
HCT: 30.7 % — ABNORMAL LOW (ref 36.0–46.0)
Hemoglobin: 10.2 g/dL — ABNORMAL LOW (ref 12.0–15.0)
MCH: 27.9 pg (ref 26.0–34.0)
MCHC: 33.2 g/dL (ref 30.0–36.0)
MCV: 84.1 fL (ref 80.0–100.0)
Platelets: 265 10*3/uL (ref 150–400)
RBC: 3.65 MIL/uL — ABNORMAL LOW (ref 3.87–5.11)
RDW: 15.8 % — ABNORMAL HIGH (ref 11.5–15.5)
WBC: 29.3 10*3/uL — ABNORMAL HIGH (ref 4.0–10.5)
nRBC: 0.4 % — ABNORMAL HIGH (ref 0.0–0.2)

## 2022-11-02 LAB — BETA-HYDROXYBUTYRIC ACID
Beta-Hydroxybutyric Acid: 0.07 mmol/L (ref 0.05–0.27)
Beta-Hydroxybutyric Acid: 0.05 mmol/L (ref 0.05–0.27)

## 2022-11-02 LAB — LACTIC ACID, PLASMA: Lactic Acid, Venous: 1.6 mmol/L (ref 0.5–1.9)

## 2022-11-02 LAB — OSMOLALITY
Osmolality: 306 mOsm/kg — ABNORMAL HIGH (ref 275–295)
Osmolality: 296 mOsm/kg — ABNORMAL HIGH (ref 275–295)

## 2022-11-02 LAB — MRSA NEXT GEN BY PCR, NASAL: MRSA by PCR Next Gen: NOT DETECTED

## 2022-11-02 MED ORDER — PHENYLEPHRINE HCL-NACL 20-0.9 MG/250ML-% IV SOLN
INTRAVENOUS | Status: AC
  Filled 2022-11-02: qty 250

## 2022-11-02 MED ORDER — ACETAMINOPHEN 500 MG PO TABS: 1000.0000 mg | ORAL_TABLET | Freq: Once | ORAL | Status: DC

## 2022-11-02 MED ORDER — CHLORHEXIDINE GLUCONATE CLOTH 2 % EX PADS
6.0000 | MEDICATED_PAD | Freq: Every day | CUTANEOUS | Status: DC
  Administered 2022-11-02 – 2022-11-25 (×24): 6 via TOPICAL

## 2022-11-02 MED ORDER — INSULIN ASPART 100 UNIT/ML IJ SOLN: 3.0000 [IU] | INTRAMUSCULAR | Status: DC

## 2022-11-02 MED ORDER — JUVEN PO PACK
1.0000 | PACK | Freq: Two times a day (BID) | ORAL | Status: DC
  Administered 2022-11-02 – 2022-11-12 (×18): 1
  Filled 2022-11-02 (×18): qty 1

## 2022-11-02 MED ORDER — VANCOMYCIN HCL 2000 MG/400ML IV SOLN
2000.0000 mg | Freq: Once | INTRAVENOUS | Status: AC
  Administered 2022-11-02: 2000 mg via INTRAVENOUS
  Filled 2022-11-02 (×2): qty 400

## 2022-11-02 MED ORDER — PHENYLEPHRINE 80 MCG/ML (10ML) SYRINGE FOR IV PUSH (FOR BLOOD PRESSURE SUPPORT)
PREFILLED_SYRINGE | INTRAVENOUS | Status: AC
  Filled 2022-11-02: qty 10

## 2022-11-02 MED ORDER — FENTANYL 2500MCG IN NS 250ML (10MCG/ML) PREMIX INFUSION
0.0000 ug/h | INTRAVENOUS | Status: DC
  Administered 2022-11-02: 50 ug/h via INTRAVENOUS
  Administered 2022-11-02 – 2022-11-03 (×3): 200 ug/h via INTRAVENOUS
  Administered 2022-11-04: 400 ug/h via INTRAVENOUS
  Administered 2022-11-04: 250 ug/h via INTRAVENOUS
  Administered 2022-11-04 – 2022-11-05 (×2): 400 ug/h via INTRAVENOUS
  Administered 2022-11-05: 100 ug/h via INTRAVENOUS
  Administered 2022-11-05: 400 ug/h via INTRAVENOUS
  Filled 2022-11-02 (×10): qty 250

## 2022-11-02 MED ORDER — FENTANYL CITRATE PF 50 MCG/ML IJ SOSY
50.0000 ug | PREFILLED_SYRINGE | Freq: Once | INTRAMUSCULAR | Status: AC
  Administered 2022-11-02: 50 ug via INTRAVENOUS
  Filled 2022-11-02: qty 1

## 2022-11-02 MED ORDER — INSULIN ASPART 100 UNIT/ML IJ SOLN: 13.0000 [IU] | INTRAMUSCULAR | Status: DC

## 2022-11-02 MED ORDER — INSULIN DETEMIR 100 UNIT/ML ~~LOC~~ SOLN
38.0000 [IU] | Freq: Two times a day (BID) | SUBCUTANEOUS | Status: DC
  Filled 2022-11-02: qty 0.38

## 2022-11-02 MED ORDER — PROPOFOL 1000 MG/100ML IV EMUL
5.0000 ug/kg/min | INTRAVENOUS | Status: DC
  Administered 2022-11-02 (×3): 40 ug/kg/min via INTRAVENOUS
  Administered 2022-11-02: 30 ug/kg/min via INTRAVENOUS
  Filled 2022-11-02 (×2): qty 100
  Filled 2022-11-02: qty 200

## 2022-11-02 MED ORDER — INSULIN ASPART 100 UNIT/ML IJ SOLN
0.0000 [IU] | INTRAMUSCULAR | Status: DC
  Administered 2022-11-02: 11 [IU] via SUBCUTANEOUS
  Administered 2022-11-03 (×3): 15 [IU] via SUBCUTANEOUS

## 2022-11-02 MED ORDER — INSULIN ASPART 100 UNIT/ML IJ SOLN
3.0000 [IU] | INTRAMUSCULAR | Status: DC
  Administered 2022-11-02: 6 [IU] via SUBCUTANEOUS

## 2022-11-02 MED ORDER — MIDAZOLAM HCL 2 MG/2ML IJ SOLN
INTRAMUSCULAR | Status: DC | PRN
  Administered 2022-11-01: 2 mg via INTRAVENOUS

## 2022-11-02 MED ORDER — PROPOFOL 1000 MG/100ML IV EMUL
INTRAVENOUS | Status: AC
  Filled 2022-11-02: qty 100

## 2022-11-02 MED ORDER — PROPOFOL 500 MG/50ML IV EMUL
INTRAVENOUS | Status: DC | PRN
  Administered 2022-11-02: 50 ug/kg/min via INTRAVENOUS

## 2022-11-02 MED ORDER — ORAL CARE MOUTH RINSE
15.0000 mL | OROMUCOSAL | Status: DC
  Administered 2022-11-02 – 2022-11-11 (×112): 15 mL via OROMUCOSAL

## 2022-11-02 MED ORDER — VITAMIN C 500 MG PO TABS
250.0000 mg | ORAL_TABLET | Freq: Every day | ORAL | Status: DC
  Administered 2022-11-02: 250 mg
  Filled 2022-11-02: qty 1

## 2022-11-02 MED ORDER — ROCURONIUM BROMIDE 10 MG/ML (PF) SYRINGE
PREFILLED_SYRINGE | INTRAVENOUS | Status: AC
  Filled 2022-11-02: qty 10

## 2022-11-02 MED ORDER — FENTANYL BOLUS VIA INFUSION: 50.0000 ug | INTRAVENOUS | Status: DC | PRN

## 2022-11-02 MED ORDER — PANTOPRAZOLE SODIUM 40 MG IV SOLR
40.0000 mg | INTRAVENOUS | Status: DC
  Administered 2022-11-02 – 2022-11-12 (×11): 40 mg via INTRAVENOUS
  Filled 2022-11-02 (×11): qty 10

## 2022-11-02 MED ORDER — ORAL CARE MOUTH RINSE: 15.0000 mL | OROMUCOSAL | Status: DC | PRN

## 2022-11-02 MED ORDER — DEXTROSE 10 % IV SOLN: INTRAVENOUS | Status: DC | PRN

## 2022-11-02 MED ORDER — PHENYLEPHRINE 80 MCG/ML (10ML) SYRINGE FOR IV PUSH (FOR BLOOD PRESSURE SUPPORT)
PREFILLED_SYRINGE | INTRAVENOUS | Status: DC | PRN
  Administered 2022-11-01: 160 ug via INTRAVENOUS
  Administered 2022-11-01: 80 ug via INTRAVENOUS
  Administered 2022-11-01: 160 ug via INTRAVENOUS
  Administered 2022-11-01: 80 ug via INTRAVENOUS
  Administered 2022-11-01 – 2022-11-02 (×3): 160 ug via INTRAVENOUS

## 2022-11-02 MED ORDER — PROPOFOL 10 MG/ML IV BOLUS
INTRAVENOUS | Status: DC | PRN
  Administered 2022-11-01: 200 mg via INTRAVENOUS

## 2022-11-02 MED ORDER — SUCCINYLCHOLINE CHLORIDE 200 MG/10ML IV SOSY
PREFILLED_SYRINGE | INTRAVENOUS | Status: DC | PRN
  Administered 2022-11-01: 180 mg via INTRAVENOUS

## 2022-11-02 MED ORDER — LINEZOLID 600 MG/300ML IV SOLN
600.0000 mg | Freq: Two times a day (BID) | INTRAVENOUS | Status: DC
  Administered 2022-11-02 – 2022-11-03 (×4): 600 mg via INTRAVENOUS
  Filled 2022-11-02 (×5): qty 300

## 2022-11-02 MED ORDER — MIDAZOLAM HCL 2 MG/2ML IJ SOLN
1.0000 mg | INTRAMUSCULAR | Status: DC | PRN
  Administered 2022-11-03: 2 mg via INTRAVENOUS
  Filled 2022-11-02 (×2): qty 2

## 2022-11-02 MED ORDER — INSULIN DETEMIR 100 UNIT/ML ~~LOC~~ SOLN
38.0000 [IU] | Freq: Two times a day (BID) | SUBCUTANEOUS | Status: DC
  Administered 2022-11-03: 38 [IU] via SUBCUTANEOUS
  Filled 2022-11-02 (×2): qty 0.38

## 2022-11-02 MED ORDER — VITAL HIGH PROTEIN PO LIQD
1000.0000 mL | ORAL | Status: DC
  Administered 2022-11-02 – 2022-11-03 (×2): 1000 mL

## 2022-11-02 MED ORDER — LIDOCAINE 2% (20 MG/ML) 5 ML SYRINGE
INTRAMUSCULAR | Status: DC | PRN
  Administered 2022-11-01: 100 mg via INTRAVENOUS

## 2022-11-02 MED ORDER — INSULIN DETEMIR 100 UNIT/ML ~~LOC~~ SOLN
38.0000 [IU] | Freq: Two times a day (BID) | SUBCUTANEOUS | Status: DC
  Administered 2022-11-02: 38 [IU] via SUBCUTANEOUS
  Filled 2022-11-02: qty 0.38

## 2022-11-02 MED ORDER — POTASSIUM CHLORIDE 10 MEQ/50ML IV SOLN
10.0000 meq | INTRAVENOUS | Status: AC
  Administered 2022-11-02 (×4): 10 meq via INTRAVENOUS
  Filled 2022-11-02 (×4): qty 50

## 2022-11-02 MED ORDER — PROPOFOL 1000 MG/100ML IV EMUL
INTRAVENOUS | Status: AC
  Administered 2022-11-02: 50 ug/kg/min via INTRAVENOUS
  Filled 2022-11-02: qty 100

## 2022-11-02 MED ORDER — FENTANYL CITRATE (PF) 250 MCG/5ML IJ SOLN
INTRAMUSCULAR | Status: DC | PRN
  Administered 2022-11-01: 150 ug via INTRAVENOUS

## 2022-11-02 MED ORDER — NOREPINEPHRINE 4 MG/250ML-% IV SOLN
INTRAVENOUS | Status: AC
  Filled 2022-11-02: qty 250

## 2022-11-02 MED ORDER — STERILE WATER FOR INJECTION IV SOLN
INTRAVENOUS | Status: DC
  Filled 2022-11-02: qty 1000
  Filled 2022-11-02: qty 150
  Filled 2022-11-02: qty 1000
  Filled 2022-11-02: qty 150
  Filled 2022-11-02: qty 1000

## 2022-11-02 MED ORDER — NOREPINEPHRINE 4 MG/250ML-% IV SOLN
0.0000 ug/min | INTRAVENOUS | Status: DC
  Administered 2022-11-02: 16 ug/min via INTRAVENOUS
  Administered 2022-11-02: 2 ug/min via INTRAVENOUS
  Administered 2022-11-02: 12 ug/min via INTRAVENOUS
  Filled 2022-11-02 (×2): qty 250

## 2022-11-02 MED ORDER — ZINC SULFATE 220 (50 ZN) MG PO CAPS
220.0000 mg | ORAL_CAPSULE | Freq: Every day | ORAL | Status: DC
  Administered 2022-11-02 – 2022-11-13 (×10): 220 mg
  Filled 2022-11-02 (×10): qty 1

## 2022-11-02 MED ORDER — SODIUM BICARBONATE 8.4 % IV SOLN
INTRAVENOUS | Status: AC
  Administered 2022-11-02: 100 meq via INTRAVENOUS
  Filled 2022-11-02: qty 50

## 2022-11-02 MED ORDER — ROCURONIUM BROMIDE 10 MG/ML (PF) SYRINGE
PREFILLED_SYRINGE | INTRAVENOUS | Status: DC | PRN
  Administered 2022-11-01: 60 mg via INTRAVENOUS
  Administered 2022-11-02: 40 mg via INTRAVENOUS

## 2022-11-02 MED ORDER — LACTATED RINGERS IV SOLN: INTRAVENOUS | Status: DC | PRN

## 2022-11-02 MED ORDER — SODIUM BICARBONATE 8.4 % IV SOLN
100.0000 meq | Freq: Once | INTRAVENOUS | Status: AC
  Filled 2022-11-02: qty 50

## 2022-11-02 NOTE — Progress Notes (Signed)
[facility] Progress Note Patient Name: Lindsey [name] DOB: 05-12-1986 MRN: [id number]   Date of Service  11/02/2022  HPI/Events of Note  Multiple issues: 1. ABG on 100%/PRVC 25/TV 450/P 10 = 7.079/68.7/91/20. CVP = 18.  eICU Interventions  Plan: Increase PRVC rate to 32. NaHCO3 100 meq IV X 1. NaHCO3 IV infusion to run at 50 mL/hour.  Repeat ABG at 7:30 AM.      Intervention Category Major Interventions: Acid-Base disturbance - evaluation and management;Respiratory failure - evaluation and management  Trinitie Mcgirr Cornelia Copa 11/02/2022, 3:41 AM

## 2022-11-02 NOTE — Progress Notes (Signed)
NAME:  Lindsey [name], MRN:  [id number], DOB:  11-21-1985, LOS: 1 ADMISSION DATE:  11/11/2022, CONSULTATION DATE:  11/02/2022 REFERRING MD:  Alvino Chapel CHIEF COMPLAINT:  Fatigue, buttock wounds   History of Present Illness:  Lindsey [name] is a 36 y.o. female who has a PMH as below including DM2. She presented to [facility] 1/9 with fatigue, dyspnea, buttock wounds that had been getting worse. First noticed buttock wound around thanksgiving but it healed on its own. Then recurred around new years and has persisted and worsened to the point of malodorous drainage. She has also had fatigue and dyspnea. She apparently had been eating badly around the holidays and her CBG's had been reading high. Normally she states that CBG's are well controlled.  In ED, she was found to have HHS. 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  While at [facility], she also developed A.Fib RVR for which she received Amiodarone bolus x 2 followed by infusion. She was given one dose of Flagyl and was transferred to [facility] ED for surgical evaluation. En route to [facility], she converted to NSR. BP has been stable since ED presentation.  She was evaluated by surgery and is being taken to the OR for debridement tonight. She is currently on Insulin infusion, Amiodarone infusion, potassium supplementation. Her mother is with her at the bedside.  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  Significant Hospital Events: Including procedures, antibiotic start and stop dates in addition to other pertinent events   CT abdomen pelvis 1/9 > basilar atelectasis, normal stomach, appendix, bowel wall, no hernia, mild to moderate subcutaneous impaired muscular inflammatory fat stranding anterolateral aspect abdominal wall on the left, no ascites.  Soft tissue gas along the perineum on the right with medial and posterior medial right gluteal involvement consistent with Fournier's gangrene OR for debridement 1/9 Blood cultures 1/9 >  Urine culture 1/9 >  Right buttock wound culture 1/10 > GPC, GNR >   Interim History / Subjective:  PRVC 470, 1.00, PEEP 10 Norepinephrine 16 Insulin infusion 14 units/h Bicarb drip 50 cc/h Propofol 40 Remains in sinus rhythm I/O+ 6.2 L total, CVP 18  Objective:  Blood pressure (!) 119/45, pulse 84, temperature 99.1 F (37.3 C), temperature source Oral, resp. rate (!) 35, height 5\' 6"  (1.676 m), weight (!) 201.9 kg, SpO2 99 %. CVP:  [18 mmHg-19 mmHg] 18 mmHg  Vent Mode: PRVC FiO2 (%):  [60 %-100 %] 60 % Set Rate:  [15 bmp-35 bmp] 35 bmp Vt Set:  [400 mL-470 mL] 470 mL PEEP:  [8 cmH20-10 cmH20] 10 cmH20 Plateau Pressure:  [30 cmH20-31 cmH20] 30 cmH20   Intake/Output Summary (Last 24 hours) at 11/02/2022 0815 Last data filed at 11/02/2022 8299 Gross per 24 hour  Intake 6527.17 ml  Output 250 ml  Net 6277.17 ml   Filed Weights   11/19/2022 1734 10/24/2022 1858  Weight: (!) 204.1 kg (!) 201.9 kg    Examination: General: Obese woman, critically ill, sedated and intubated Neuro: Sedated, HEENT:  [place]/AT. Sclerae anicteric. EOMI. MM dry. Cardiovascular: RRR, no M/R/G.  Lungs: Respirations even and unlabored.  CTA bilaterally, No W/R/R. Abdomen: Morbidly obese. BS x 4, soft, NT/ND.  Musculoskeletal: No gross deformities, no edema.  Skin: R buttock with errythema  and multiple wounds with drainage. Skin otherwise warm, no rashes.   Assessment & Plan:   HHS - presumed 2/2 Fournier's Gangrene. Hx DM 2. 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 -Appreciate surgery debridement and management.  Planning for return trips to the OR per their plans -Continue Zosyn, clindamycin, vancomycin.  [place] based on culture data when available -Follow wound cultures, blood and urine cultures  Septic shock due to Fournier's gangrene -IV fluid resuscitation, CVP currently 18.  Goal 12 -Continue norepinephrine infusion, titrate for MAP 65 -Antibiotics as above  Acute metabolic encephalopathy due to acute infection with septic shock, HHS -Supportive care -Add fentanyl infusion, anticipate significant pain component given her need for debridement -Attempt wean propofol, may be able to change to an alternative if adequate pain control, either Versed pushes or Precedex infusion  A.fib with RVR - 2/2 above, converted to NSR after Amiodarone. Hx HTN, HLD. -Continue amiodarone for now, low threshold to discontinue next 24 hours as suspect that her atrial fibrillation is secondary to acute illness -Home irbesartan, pravastatin, propranolol, Diamox are all on hold  Pseudohyponatremia - 2/2 Hyperglycemia. Hypokalemia - being repleted. AKI. NAGMA, multifactorial but principally due to. Hypocalcemia. -Following frequent BMP -Potassium supplementation 1/10 -Lactic acid with next blood draw -Follow urine output, serum creatinine  Hx Hypothyroidism. -Continue home levothyroxine  Hx Anxiety, Depression, Panic disorder, PTSD. -Continue to hold alprazolam, Quetiapine, Venlafaxine, Lorazepam.  Best practice (evaluated daily):  Diet/type:  NPO DVT prophylaxis: prophylactic heparin  GI prophylaxis: PPI Lines: Central line and Arterial Line Foley:  Yes, and it is still needed Code Status:  full code Last date of multidisciplinary goals of care discussion: Pending  Labs   CBC: Recent Labs  Lab 10/28/2022 1811 10/24/2022 2024 11/02/22 0214 11/02/22 0330 11/02/22 0500 11/02/22 0801  WBC 22.8*  --   --   --  29.3*  --   NEUTROABS 18.4*  --   --   --   --   --   HGB 11.3* 11.6* 11.9* 11.6* 10.2* 11.6*  HCT 34.0* 34.0* 35.0* 34.0* 30.7* 34.0*  MCV 83.1  --   --   --  84.1  --   PLT 240  --   --   --  265  --     Basic Metabolic Panel: Recent Labs  Lab 11/23/2022 1811 11/06/2022 2024 10/30/2022 2032 11/02/22 0214 11/02/22 0242 11/02/22 0330 11/02/22 0500 11/02/22 0801  NA 121*   < > 125* 131* 125* 132* 128* 132*  K 3.3*   < > 3.4* 3.5 3.2* 3.4* 3.4* 3.7  CL 88*  --  95*  --  96*  --  96*  --   CO2 15*  --  16*  --  19*  --  20*  --   GLUCOSE 596*  --  531*  --  316*  --  262*  --   BUN 33*  --  30*  --  28*  --  29*  --   CREATININE 1.27*  --  1.16*  --  1.34*  --  1.44*  --   CALCIUM 8.4*  --  7.5*  --  7.2*  --  7.3*  --    < > = values in this interval not displayed.   GFR: Estimated Creatinine Clearance: 99.2 mL/min (A) (by C-G formula based on SCr of 1.44 mg/dL (H)). Recent Labs  Lab 11/13/2022 1811 11/12/2022 1942 11/02/22 0500  WBC 22.8*  --  29.3*  LATICACIDVEN 3.5* 2.9*  --     Liver Function Tests: No results for input(s): "AST", "ALT", "ALKPHOS", "BILITOT", "PROT", "ALBUMIN" in the last 168 hours. No results for input(s): "LIPASE", "AMYLASE" in the last 168 hours. No results for input(s): "AMMONIA" in the last 168 hours.  ABG    Component Value Date/Time   PHART 7.187 (LL) 11/02/2022 0801   PCO2ART 57.0 (H) 11/02/2022 0801   PO2ART 162 (H) 11/02/2022 0801   HCO3 21.5 11/02/2022 0801   TCO2 23 11/02/2022 0801   ACIDBASEDEF 7.0 (H) 11/02/2022 0801   O2SAT 99 11/02/2022 0801     Coagulation  Profile: No results for input(s): "INR", "PROTIME" in the last 168 hours.  Cardiac Enzymes: No results for input(s): "CKTOTAL", "CKMB", "CKMBINDEX", "TROPONINI" in the last 168 hours.  HbA1C: Hgb A1c MFr Bld  Date/Time Value Ref Range Status  10/31/2022 10:42 PM 14.7 (H) 4.8 - 5.6 % Final    Comment:    (NOTE) Pre diabetes:          5.7%-6.4%  Diabetes:              >6.4%  Glycemic control for   <7.0% adults with diabetes     CBG: Recent Labs  Lab 11/02/22 0345 11/02/22 0453 11/02/22 0559 11/02/22 0712 11/02/22 0809  GLUCAP 300* 257* 255* 210* 206*     Critical care time: 45 min.   Baltazar Apo, MD, PhD 11/02/2022, 8:41 AM [facility] [phone] or if no answer before 7:00PM call [phone] For any issues after 7:00PM please call eLink [phone]

## 2022-11-02 NOTE — Transfer of Care (Signed)
Immediate Anesthesia Transfer of Care Note  Patient: Lindsey [name]  Procedure(s) Performed: IRRIGATION AND DEBRIDEMENT BUTTOCKS (Right: Buttocks)  Patient Location: ICU  Anesthesia Type:General  Level of Consciousness: sedated and Patient remains intubated per anesthesia plan  Airway & Oxygen Therapy: Patient remains intubated per anesthesia plan and Patient placed on Ventilator (see vital sign flow sheet for setting)  Post-op Assessment: Report given to RN and Post -op Vital signs reviewed and stable  Post vital signs: Reviewed and stable  Last Vitals:  Vitals Value Taken Time  BP 133/52 11/02/22 0140  Temp    Pulse 78 11/02/22 0150  Resp 16 11/02/22 0150  SpO2 86 % 11/02/22 0150  Vitals shown include unvalidated device data.  Last Pain:  Vitals:   11/02/2022 1736  PainSc: 10-Worst pain ever         Complications: No notable events documented.

## 2022-11-02 NOTE — Progress Notes (Signed)
[facility] Progress Note Patient Name: Lindsey [name] DOB: [birth year] MRN: [id number]   Date of Service  11/02/2022  HPI/Events of Note  Multiple issues: 1. Hypotension - BP = 104/39 with MAP = 56. 2. ABG on 100%/PRVC 15/TV 450/P 10 = 7.088/70.2/82/21.3.  eICU Interventions  Plan: Increase PRVC rate to 26. Repeat ABG at 3:30 AM. Monitor CVP now and Q 4 hours.  Norepinephrine IV infusion. Titrate to MAP >= 65 and SBP > 90.     Intervention Category Major Interventions: Respiratory failure - evaluation and management;Acid-Base disturbance - evaluation and management;Hypotension - evaluation and management  Zymiere Trostle Cornelia Copa 11/02/2022, 2:27 AM

## 2022-11-02 NOTE — Op Note (Signed)
11/02/2022  1:06 AM  PATIENT:  Lindsey [name]  37 y.o. female  PRE-OPERATIVE DIAGNOSIS:  Necrotizing Fascitis  POST-OPERATIVE DIAGNOSIS:  Necrotizing Fascitis  PROCEDURE:  Procedure(s): IRRIGATION AND DEBRIDEMENT BUTTOCKS (Right) 20x15x6cm area of exsicion  SURGEON:  Surgeon(s) and Role:    Ralene Ok, MD - Primary  ANESTHESIA:   general  EBL:  150 mL   BLOOD ADMINISTERED:none  DRAINS: none   LOCAL MEDICATIONS USED:  NONE  SPECIMEN:  Source of Specimen:  right gluteal abscess  DISPOSITION OF SPECIMEN:  microbiology  COUNTS:  YES  TOURNIQUET:  * No tourniquets in log *  DICTATION: .Dragon Dictation Indication procedure: Patient is a 37 year old female, history of diabetes, morbid obesity and right gluteal infection.  Patient had increased pain and was seen in the ER.  Patient under CT scan was found to have a large necrotizing soft tissue infection of the right gluteal area.  Patient was transferred to [facility] for evaluation and management.  Findings: Patient had a area of subcutaneous soft tissue necrosis.  This area measured approximately 20 x 15 x 6 cm and final excision size.  Large amount of dirty dishwater fluid that was excised.  This did extend over to the right labia majora area.  Dissection was taken down to healthy fat tissue.  The fascia was not visualized.  This did not appear to go below the adipose tissue.  Details of procedure: After the patient was consented she was taken back to the OR and placed in supine position with bilateral SCDs in place.  She underwent general and trach intubation.  Patient was then placed in the prone position.  Patient is prepped and draped standard fashion.  A timeout was called and all facts verified.  At this time a 10 blade was used to make an incision.  This was over the area of the original infection.  Large amount of purulence was expressed.  This was foul-smelling.  Both aerobic anaerobic cultures were  obtained.  At this time with cautery I was able to debride the area of concern.  This did tunnel anterior towards the back and laterally.  This was excised judiciously.  Large amount of necrotic and infected soft tissue necrosis was encountered.  This did tunnel anteriorly towards the labia majora on the right side.  As the patient was position I was unable to debride this area on this occasion.  I was not able to visualize the fascia.  However I was able to take down the infected tissue down to healthy appearing adipose tissue.  At this time the area was checked for hemostasis which was adequate.  The area was then measured this was approximately 20 x 15 x 6 cm.  The area was then packed with Betadine soaked Kerlix.  The wound was then dressed with ABD pad and tape.  Patient tolerated the procedure well.  She was taken to the ICU intubated in critical condition.  CASE DATA:  Type of patient?: DOW CASE (Surgical Hospitalist [facility])  Status of Case? EMERGENT Add On  Infection Present At Time Of Surgery (PATOS)?   Necrotizing soft tissue infection   Excisional debridement:   2.  Tool used for debridement (curette, scapel, etc.) scalpel and electrocautery  3.  Frequency of surgical debridement.   First debridement  4.  Measurement of total devitalized tissue (wound surface) before and after surgical debridement.   20 x 15 x 6  5.  Area and depth of devitalized tissue  removed from wound.  6 cm    7.  Evidence of the progress of the wound's response to treatment.  A.  Current wound volume (current dimensions and depth).  20 x 16 x 6 cm  B.  Presence (and extent of) of infection.  Yes  C.  Presence (and extent of) of non viable tissue.  As above  D.  Other material in the wound that is expected to inhibit healing.  No  8.  Was there any viable tissue removed (measurements): No        PLAN OF CARE: Admit to inpatient   PATIENT DISPOSITION:  ICU - intubated and critically  ill.   Delay start of Pharmacological VTE agent (>24hrs) due to surgical blood loss or risk of bleeding: yes

## 2022-11-02 NOTE — Procedures (Signed)
Arterial Catheter Insertion Procedure Note  Lindsey [name]  [id number]  25-Sep-1986  Date:11/02/22  Time:2:38 AM    Provider Performing: Dominga Ferry    Procedure: Insertion of Arterial Line [id number]) with US guidance (90240)   Indication(s) Blood pressure monitoring and/or need for frequent ABGs  Consent Unable to obtain consent due to emergent nature of procedure.  Anesthesia None   Time Out Verified patient identification, verified procedure, site/side was marked, verified correct patient position, special equipment/implants available, medications/allergies/relevant history reviewed, required imaging and test results available.   Sterile Technique Maximal sterile technique including full sterile barrier drape, hand hygiene, sterile gown, sterile gloves, mask, hair covering, sterile ultrasound probe cover (if used).   Procedure Description Area of catheter insertion was cleaned with chlorhexidine and draped in sterile fashion. With real-time ultrasound guidance an arterial catheter was placed into the right radial artery.  Appropriate arterial tracings confirmed on monitor.     Complications/Tolerance None; patient tolerated the procedure well.   EBL Minimal   Specimen(s) None

## 2022-11-02 NOTE — Progress Notes (Signed)
Initial Nutrition Assessment  DOCUMENTATION CODES:  Morbid obesity  INTERVENTION: Initiate the following trickle feeds: Vital High Protein at 62mL/h (443mL/d) Prosource TF20 155mL BID This will provide 800kcal, 122g of protein, and 457mL of free water TF+propofol+IVF = 2577kcal/d At goal and in the presence of other sources of nutrition, recommend the following: Vital 1.5 @ 93mL/h (1.8L/d) Prosource TF 20 This will provide 2780kcal, 142g of protein, and 1367mL of free water. -1 packet Juven BID, each packet provides 95 calories, 2.5 grams of protein (collagen), and 9.8 grams of carbohydrate (3 grams sugar); also contains 7 grams of L-arginine and L-glutamine, 300 mg vitamin C, 15 mg vitamin E, 1.2 mcg vitamin B-12, 9.5 mg zinc, 200 mg calcium, and 1.5 g  Calcium Beta-hydroxy-Beta-methylbutyrate to support wound healing Vitamin C 250mg   daily, Zinc 220mg  x14 days for wound healing  NUTRITION DIAGNOSIS:   Increased nutrient needs related to wound healing as evidenced by estimated needs.  GOAL:  Provide needs based on ASPEN/SCCM guidelines  MONITOR:  Vent status, Labs, I & O's, Skin, TF tolerance  REASON FOR ASSESSMENT:  Ventilator    ASSESSMENT:  Pt with hx of DM type 2, HTN, HLD and hypothyroidism presented to ED with worsening pain from known boils on her buttocks. Imaging in ED showed large gas containing infection of the right gluteus.  Pt taken to the OR emergently for debridement of infected area. Found to have necrotizing fascitis. Remained intubated after surgery and repeat OR debridements planned.   Patient is currently intubated on ventilator support.  Pt has extremely high needs due to overall body habitus and with wound healing. Concern with full feeds at this time with subsequent stooling saturating bandages. MD ok with trickles today and with protein modules. Pt receiving some kcal from propofol and D5LR. (510kcal from IVF and 1267kcal from propofol).        MV:  15.7 L/min Temp (24hrs), Avg:99.1 F (37.3 C), Min:98.8 F (37.1 C), Max:100 F (37.8 C)  Propofol: 74ml/hr (1267kcal/d)   Intake/Output Summary (Last 24 hours) at 11/02/2022 1236 Last data filed at 11/02/2022 1200 Gross per 24 hour  Intake 8377.09 ml  Output 425 ml  Net 7952.09 ml  Net IO Since Admission: 7,952.09 mL [11/02/22 1236]  Nutritionally Relevant Medications: Scheduled Meds:  pantoprazole IV  40 mg Intravenous Q24H   phenylephrine       Continuous Infusions:  calcium gluconate     dextrose 5% lactated ringers 125 mL/hr at 11/02/22 0716   insulin 14 Units/hr (11/02/22 0716)   norepinephrine (LEVOPHED) Adult infusion 16 mcg/min (11/02/22 0716)   propofol (DIPRIVAN) infusion 40 mcg/kg/min (11/02/22 0716)   sodium bicarbonate 150 mEq  50 mL/hr at 11/02/22 0716   vancomycin     Labs Reviewed: Na 132, chloride 96 BUN 29, creatinine 1.44 CBG ranges from 206-583 mg/dL over the last 24 hours  HgbA1c 14.7% (1/9)  NUTRITION - FOCUSED PHYSICAL EXAM: Flowsheet Row Most Recent Value  Orbital Region No depletion  Upper Arm Region No depletion  Thoracic and Lumbar Region No depletion  Buccal Region No depletion  Temple Region No depletion  Clavicle Bone Region No depletion  Clavicle and Acromion Bone Region No depletion  Scapular Bone Region No depletion  Dorsal Hand No depletion  Patellar Region No depletion  Anterior Thigh Region No depletion  Posterior Calf Region No depletion  Edema (RD Assessment) Moderate  [generalized]  Hair Reviewed  Eyes Reviewed  Mouth Reviewed  Skin Reviewed  Nails Reviewed  Diet Order:   Diet Order             Diet NPO time specified  Diet effective now                   EDUCATION NEEDS:  Not appropriate for education at this time  Skin:  Skin Assessment: Reviewed RN Assessment  Last BM:  prior to admission  Height:  Ht Readings from Last 1 Encounters:  11/16/2022 5\' 6"  (1.676 m)    Weight:  Wt Readings from Last  1 Encounters:  11/09/2022 (!) 201.9 kg    Ideal Body Weight:  59.1 kg  BMI:  Body mass index is 71.82 kg/m.  Estimated Nutritional Needs:  Kcal:  2700-3000 kcal/d Protein:  130-150 g/d Fluid:  2.2-2.5L/d    Ranell Patrick, RD, LDN Clinical Dietitian RD pager # available in [facility]  After hours/weekend pager # available in [facility]

## 2022-11-02 NOTE — Progress Notes (Signed)
0 Day Post-Op  Subjective: Intubated on vent.  Sedated on propofol.  On bicarb gtt and levo at 40mcg.  On amio gtt as well.  Objective: Vital signs in last 24 hours: Temp:  [98.8 F (37.1 C)-99.1 F (37.3 C)] 99.1 F (37.3 C) (01/10 0749) Pulse Rate:  [56-152] 84 (01/10 0630) Resp:  [16-46] 35 (01/10 0630) BP: (67-135)/(26-83) 119/45 (01/10 0400) SpO2:  [86 %-99 %] 99 % (01/10 0754) Arterial Line BP: (108-144)/(36-49) 121/47 (01/10 0630) FiO2 (%):  [60 %-100 %] 60 % (01/10 0754) Weight:  [201.9 kg-204.1 kg] 201.9 kg (01/09 1858) Last BM Date :  (PTA)  Intake/Output from previous day: 01/09 0701 - 01/10 0700 In: 6151.9 [I.V.:3547.5; IV Piggyback:2604.4] Out: 250 [Urine:100; Blood:150] Intake/Output this shift: Total I/O In: 375.3 [I.V.:246.9; IV Piggyback:128.4] Out: -   PE: Gen: sedated, critically ill on vent Heart: regular Lungs: on vent Abd: morbidly obese GU: foley in place with minimal UOP currently. Unable to see wound currently due to laying on her back but also due to body habitus.  Packing to remain in place today.  Lab Results:  Recent Labs    11/14/2022 1811 10/27/2022 2024 11/02/22 0500 11/02/22 0801  WBC 22.8*  --  29.3*  --   HGB 11.3*   < > 10.2* 11.6*  HCT 34.0*   < > 30.7* 34.0*  PLT 240  --  265  --    < > = values in this interval not displayed.   BMET Recent Labs    11/02/22 0242 11/02/22 0330 11/02/22 0500 11/02/22 0801  NA 125*   < > 128* 132*  K 3.2*   < > 3.4* 3.7  CL 96*  --  96*  --   CO2 19*  --  20*  --   GLUCOSE 316*  --  262*  --   BUN 28*  --  29*  --   CREATININE 1.34*  --  1.44*  --   CALCIUM 7.2*  --  7.3*  --    < > = values in this interval not displayed.   PT/INR No results for input(s): "LABPROT", "INR" in the last 72 hours. CMP     Component Value Date/Time   NA 132 (L) 11/02/2022 0801   K 3.7 11/02/2022 0801   CL 96 (L) 11/02/2022 0500   CO2 20 (L) 11/02/2022 0500   GLUCOSE 262 (H) 11/02/2022 0500    BUN 29 (H) 11/02/2022 0500   CREATININE 1.44 (H) 11/02/2022 0500   CALCIUM 7.3 (L) 11/02/2022 0500   PROT 6.7 09/09/2011 2152   ALBUMIN 3.0 (L) 09/09/2011 2152   AST 14 09/09/2011 2152   ALT 28 09/09/2011 2152   ALKPHOS 94 09/09/2011 2152   BILITOT 0.3 09/09/2011 2152   GFRNONAA 48 (L) 11/02/2022 0500   GFRAA >60 07/03/2016 0004   Lipase  No results found for: "LIPASE"     Studies/Results: DG CHEST PORT 1 VIEW  Result Date: 11/02/2022 CLINICAL DATA:  Central venous catheter placement EXAM: PORTABLE CHEST 1 VIEW COMPARISON:  10/31/2022 FINDINGS: The examination is limited by lordotic positioning and the pannus overlying the lung bases. Right internal jugular central venous catheter is in place with its tip overlying the expected superior vena cava. Endotracheal tube seen 5.4 cm above the carina with its tip at the level of the clavicular heads. Lung volumes are small with developing opacity within the upper lobes bilaterally in keeping with progressive atelectasis or developing infiltrate on this  limited examination. No pneumothorax or pleural effusion. Cardiac size is mildly enlarged, likely stable when accounting for patient positioning. No acute bone abnormality. IMPRESSION: 1. Right internal jugular central venous catheter tip within the superior vena cava. No pneumothorax. 2. Endotracheal tube in appropriate position. 3. Pulmonary hypoinflation. Developing bilateral upper lobe opacities may reflect atelectasis or developing inflammatory infiltrate. Electronically Signed   By: Fidela Salisbury M.D.   On: 11/02/2022 02:30   CT ABDOMEN PELVIS W CONTRAST  Result Date: 11/14/2022 CLINICAL DATA:  Elevated blood sugar. EXAM: CT ABDOMEN AND PELVIS WITH CONTRAST TECHNIQUE: Multidetector CT imaging of the abdomen and pelvis was performed using the standard protocol following bolus administration of intravenous contrast. RADIATION DOSE REDUCTION: This exam was performed according to the departmental  dose-optimization program which includes automated exposure control, adjustment of the mA and/or kV according to patient size and/or use of iterative reconstruction technique. CONTRAST:  181mL OMNIPAQUE IOHEXOL 350 MG/ML SOLN COMPARISON:  None Available. FINDINGS: Lower chest: Mild linear atelectasis is seen within the left lung base. Hepatobiliary: There is diffuse fatty infiltration of the liver parenchyma. No focal liver abnormality is seen. The gallbladder is contracted. No gallstones, gallbladder wall thickening, or biliary dilatation. Pancreas: Unremarkable. No pancreatic ductal dilatation or surrounding inflammatory changes. Spleen: Normal in size without focal abnormality. Adrenals/Urinary Tract: Adrenal glands are unremarkable. Kidneys are normal, without renal calculi, focal lesion, or hydronephrosis. Bladder is unremarkable. Stomach/Bowel: Stomach is within normal limits. Appendix appears normal. No evidence of bowel wall thickening, distention, or inflammatory changes. Vascular/Lymphatic: No significant vascular findings are present. No enlarged abdominal or pelvic lymph nodes. Reproductive: Uterus and bilateral adnexa are unremarkable. Other: No abdominal wall hernia. A mild-to-moderate amount of subcutaneous and para muscular inflammatory fat stranding is seen along the anterolateral aspect of the abdominal wall on the left. No abdominopelvic ascites. Musculoskeletal: A large amount of soft tissue air is seen along the perineum on the right and within the medial and posteromedial aspects of the right gluteal region. A moderate amount of associated inflammatory fat stranding is also seen. No acute osseous abnormalities are identified. IMPRESSION: 1. 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 2. Hepatic steatosis. Electronically Signed   By: Virgina Norfolk M.D.   On: 11/21/2022 20:11    CT Angio Chest PE W and/or Wo Contrast  Result Date: 11/09/2022 CLINICAL DATA:  Hypoxia and shortness of breath. EXAM: CT ANGIOGRAPHY CHEST WITH CONTRAST TECHNIQUE: Multidetector CT imaging of the chest was performed using the standard protocol during bolus administration of intravenous contrast. Multiplanar CT image reconstructions and MIPs were obtained to evaluate the vascular anatomy. RADIATION DOSE REDUCTION: This exam was performed according to the departmental dose-optimization program which includes automated exposure control, adjustment of the mA and/or kV according to patient size and/or use of iterative reconstruction technique. CONTRAST:  127mL OMNIPAQUE IOHEXOL 350 MG/ML SOLN COMPARISON:  None Available. FINDINGS: Cardiovascular: The thoracic aorta is normal in appearance. Satisfactory opacification of the pulmonary arteries to the segmental level. No evidence of pulmonary embolism. There is mild cardiomegaly. No pericardial effusion. Mediastinum/Nodes: No enlarged mediastinal, hilar, or axillary lymph nodes. Thyroid gland, trachea, and esophagus demonstrate no significant findings. Lungs/Pleura: Mild lingular and left basilar linear atelectasis is seen. There is no evidence of focal consolidation, pleural effusion or pneumothorax. Upper Abdomen: There is diffuse fatty infiltration of the liver parenchyma. Musculoskeletal: No chest wall abnormality. No acute or significant osseous  findings. Review of the MIP images confirms the above findings. IMPRESSION: 1. No evidence of pulmonary embolism or other acute intrathoracic process. 2. Mild lingular and left basilar linear atelectasis. 3. Hepatic steatosis. Electronically Signed   By: Aram Candela M.D.   On: 10/27/2022 19:56   DG Chest 1 View  Result Date: 10/29/2022 CLINICAL DATA:  Shortness of breath. EXAM: CHEST  1 VIEW COMPARISON:  03/29/2015 FINDINGS: Limited detail due to patient's size and portable AP technique. Mild cardiomegaly. The  lung apices are clear. No visible pleural effusion. Consider two-view chest radiography if concern persists. IMPRESSION: Limited detail due to patient's size and portable AP technique. Mild cardiomegaly. No visible pleural effusion. Electronically Signed   By: Paulina Fusi M.D.   On: 11/22/2022 18:28    Anti-infectives: Anti-infectives (From admission, onward)    Start     Dose/Rate Route Frequency Ordered Stop   11/02/22 1000  vancomycin (VANCOREADY) IVPB 1250 mg/250 mL  Status:  Discontinued        1,250 mg 166.7 mL/hr over 90 Minutes Intravenous Every 12 hours 11/02/2022 2135 11/17/2022 2137   11/02/22 1000  vancomycin (VANCOREADY) IVPB 1500 mg/300 mL        1,500 mg 150 mL/hr over 120 Minutes Intravenous Every 12 hours 10/27/2022 2137 11/09/22 0959   11/02/22 0300  ceFEPIme (MAXIPIME) 2 g in sodium chloride 0.9 % 100 mL IVPB  Status:  Discontinued        2 g 200 mL/hr over 30 Minutes Intravenous Every 8 hours 11/06/2022 2135 11/14/2022 2258   11/02/22 0045  vancomycin (VANCOREADY) IVPB 2000 mg/400 mL        2,000 mg 200 mL/hr over 120 Minutes Intravenous  Once 11/02/22 0030 11/02/22 0305   11/02/2022 2315  piperacillin-tazobactam (ZOSYN) IVPB 3.375 g        3.375 g 12.5 mL/hr over 240 Minutes Intravenous Every 8 hours 11/09/2022 2306     11/18/2022 2315  clindamycin (CLEOCIN) IVPB 900 mg        900 mg 100 mL/hr over 30 Minutes Intravenous Every 8 hours 11/11/2022 2306     11/14/2022 1930  vancomycin (VANCOCIN) IVPB 1000 mg/200 mL premix  Status:  Discontinued       See Hyperspace for full Linked Orders Report.   1,000 mg 200 mL/hr over 60 Minutes Intravenous  Once 11/02/2022 1823 11/02/22 0029   11/12/2022 1830  ceFEPIme (MAXIPIME) 2 g in sodium chloride 0.9 % 100 mL IVPB        2 g 200 mL/hr over 30 Minutes Intravenous  Once 11/18/2022 1819 11/19/2022 2016   11/10/2022 1830  metroNIDAZOLE (FLAGYL) IVPB 500 mg        500 mg 100 mL/hr over 60 Minutes Intravenous  Once 11/19/2022 1819 11/19/2022 2130   11/08/2022  1830  vancomycin (VANCOCIN) IVPB 1000 mg/200 mL premix  Status:  Discontinued        1,000 mg 200 mL/hr over 60 Minutes Intravenous  Once 10/28/2022 1819 11/09/2022 1823   10/29/2022 1830  vancomycin (VANCOCIN) IVPB 1000 mg/200 mL premix  Status:  Discontinued       See Hyperspace for full Linked Orders Report.   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 Derrell Lolling 1/10 -leave packing in place today -return to OR tomorrow for further debridement.  Infection appeared to be tracking into the  perineum during case last night.  Will plan for lithotomy and prone tomorrow to better assess all areas and debride adequately. -will plan for return to OR Saturday as well given how ill the patient is and the likelihood that she will need multiple debridements -cont abx therapy, prelim cx  MODERATE GRAM POSITIVE COCCI IN PAIRS AND CHAINS  MODERATE GRAM NEGATIVE RODS   FEN - NPO on vent, can likely start TFs at some point soon VTE - heparin ID - Clindamycin, Zosyn, Vanc  Septic shock - secondary to above.  Appreciate CCM's care of this patient as she is critically ill ARF - cr 1.44, but only 100cc of UOP since admission DM - on insulin gtt  Morbid obesity - BMI 72 Hypothryoidism PCOS HLD Anxiety/Depression  I reviewed hospitalist notes, last 24 h vitals and pain scores, last 48 h intake and output, last 24 h labs and trends, and last 24 h imaging results.   LOS: 1 day    Letha Cape , [facility] Surgery 11/02/2022, 8:55 AM Please see Amion for pager number during day hours 7:00am-4:30pm or 7:00am -11:30am on weekends

## 2022-11-02 NOTE — Inpatient Diabetes Management (Signed)
Inpatient Diabetes Program Recommendations  AACE/ADA: New Consensus Statement on Inpatient Glycemic Control (2015)  Target Ranges:  Prepandial:   less than 140 mg/dL      Peak postprandial:   less than 180 mg/dL (1-2 hours)      Critically ill patients:  140 - 180 mg/dL   Lab Results  Component Value Date   GLUCAP 177 (H) 11/02/2022   HGBA1C 14.7 (H) 11/12/2022    Latest Reference Range & Units 11/02/22 05:00  Sodium 135 - 145 mmol/L 128 (L)  Potassium 3.5 - 5.1 mmol/L 3.4 (L)  Chloride 98 - 111 mmol/L 96 (L)  CO2 22 - 32 mmol/L 20 (L)  Glucose 70 - 99 mg/dL 262 (H)  BUN 6 - 20 mg/dL 29 (H)  Creatinine 0.44 - 1.00 mg/dL 1.44 (H)  Calcium 8.9 - 10.3 mg/dL 7.3 (L)  Anion gap 5 - 15  12  GFR, Estimated >60 mL/min 48 (L)   Diabetes history: DM2 Outpatient Diabetes medications: Amaryl 2 mg, Actos 45 mg  Current orders for Inpatient glycemic control: IV insulin  Inpatient Diabetes Program Recommendations:   Noted patient requiring large amounts of insulin per IV insulin drip. Will follow and assist as needed.  Thank you, Nani Gasser. Leona Pressly, RN, MSN, CDE  Diabetes Coordinator Inpatient Glycemic Control Team Team Pager [phone] (8am-5pm) 11/02/2022 10:37 AM

## 2022-11-02 NOTE — Anesthesia Procedure Notes (Addendum)
Central Venous Catheter Insertion Performed by: Nolon Nations, MD, anesthesiologist Start/End1/06/2023 11:53 PM, 11/02/2022 12:10 AM Patient location: Pre-op. Preanesthetic checklist: patient identified, IV checked, site marked, risks and benefits discussed, surgical consent, monitors and equipment checked, pre-op evaluation, timeout performed and anesthesia consent Position: supine Lidocaine 1% used for infiltration and patient sedated Hand hygiene performed , maximum sterile barriers used  and Seldinger technique used Catheter size: 8 Fr Total catheter length 16. Central line was placed.Double lumen Procedure performed using ultrasound guided technique. Ultrasound Notes:image(s) printed for medical record Attempts: 1 Following insertion, dressing applied, line sutured and Biopatch. Post procedure assessment: blood return through all ports, free fluid flow and no air  Patient tolerated the procedure well with no immediate complications.

## 2022-11-02 NOTE — Progress Notes (Signed)
[facility] Progress Note Patient Name: Lindsey [name] DOB: 1986/03/20 MRN: [id number]   Date of Service  11/02/2022  HPI/Events of Note  Patient returns form OR intubated, ventilated, sedated with Propofol and nursing having difficulty getting an accurate BP. Nursing request for A-line. Portable CXR done. Await official reading.   eICU Interventions  Plan: Ventilator orders: 100%/PRVC 15/TV 150/P 10. ABG STAT. Propofol IV infusion. Titrate to RASS = 0 to -1.      Intervention Category Major Interventions: Respiratory failure - evaluation and management  Damarko Stitely Eugene 11/02/2022, 2:10 AM

## 2022-11-02 NOTE — Anesthesia Postprocedure Evaluation (Signed)
Anesthesia Post Note  Patient: Lindsey [name]  Procedure(s) Performed: IRRIGATION AND DEBRIDEMENT BUTTOCKS (Right: Buttocks)     Patient location during evaluation: SICU Anesthesia Type: General Level of consciousness: sedated and patient remains intubated per anesthesia plan Pain management: pain level controlled Vital Signs Assessment: post-procedure vital signs reviewed and stable Respiratory status: patient on ventilator - see flowsheet for VS and patient remains intubated per anesthesia plan Cardiovascular status: stable Anesthetic complications: no   No notable events documented.  Last Vitals:  Vitals:   11/02/22 1800 11/02/22 1919  BP:    Pulse: 76   Resp: (!) 35   Temp: 36.9 C 37 C  SpO2: 97%     Last Pain:  Vitals:   11/02/22 1919  TempSrc: Oral  PainSc:                  Nolon Nations

## 2022-11-02 NOTE — Progress Notes (Signed)
[facility] Progress Note Patient Name: Lindsey [name] DOB: February 15, 1986 MRN: [id number]   Date of Service  11/02/2022  HPI/Events of Note  Hyperglycemia - Blood glucose = 299 which is not covered by Q 4 hour Novolog SSI.   eICU Interventions  Will adjust Q 4 hour resistant Novolog SSI to cover a blood glucose = 299.     Intervention Category Major Interventions: Hyperglycemia - active titration of insulin therapy  Lysle Dingwall 11/02/2022, 11:49 PM

## 2022-11-02 NOTE — Anesthesia Procedure Notes (Addendum)
Procedure Name: Intubation Date/Time: 11/09/2022 11:42 PM  Performed by: Trinna Post., CRNAPre-anesthesia Checklist: Patient identified, Emergency Drugs available, Suction available, Patient being monitored and Timeout performed Patient Re-evaluated:Patient Re-evaluated prior to induction Oxygen Delivery Method: Circle system utilized Preoxygenation: Pre-oxygenation with 100% oxygen Induction Type: IV induction, Rapid sequence and Cricoid Pressure applied Laryngoscope Size: Glidescope and 3 Grade View: Grade I Tube type: Oral Tube size: 7.5 mm Number of attempts: 1 Airway Equipment and Method: Rigid stylet and Video-laryngoscopy Placement Confirmation: ETT inserted through vocal cords under direct vision, positive ETCO2 and breath sounds checked- equal and bilateral Secured at: 21 cm Tube secured with: Tape Dental Injury: Teeth and Oropharynx as per pre-operative assessment

## 2022-11-03 ENCOUNTER — Inpatient Hospital Stay (HOSPITAL_COMMUNITY): Payer: Medicaid Other | Admitting: Anesthesiology

## 2022-11-03 ENCOUNTER — Encounter (HOSPITAL_COMMUNITY): Payer: Self-pay | Admitting: Pulmonary Disease

## 2022-11-03 ENCOUNTER — Encounter (HOSPITAL_COMMUNITY): Admission: EM | Disposition: E | Payer: Self-pay | Source: Home / Self Care | Attending: Internal Medicine

## 2022-11-03 ENCOUNTER — Other Ambulatory Visit: Payer: Self-pay

## 2022-11-03 DIAGNOSIS — M726 Necrotizing fasciitis: Secondary | ICD-10-CM

## 2022-11-03 DIAGNOSIS — Z87891 Personal history of nicotine dependence: Secondary | ICD-10-CM

## 2022-11-03 DIAGNOSIS — I10 Essential (primary) hypertension: Secondary | ICD-10-CM

## 2022-11-03 DIAGNOSIS — N179 Acute kidney failure, unspecified: Secondary | ICD-10-CM

## 2022-11-03 HISTORY — PX: WOUND DEBRIDEMENT: SHX247

## 2022-11-03 HISTORY — PX: IRRIGATION AND DEBRIDEMENT ABSCESS: SHX5252

## 2022-11-03 LAB — CBC
HCT: 25.6 % — ABNORMAL LOW (ref 36.0–46.0)
Hemoglobin: 8.7 g/dL — ABNORMAL LOW (ref 12.0–15.0)
MCH: 28.3 pg (ref 26.0–34.0)
MCHC: 34 g/dL (ref 30.0–36.0)
MCV: 83.4 fL (ref 80.0–100.0)
Platelets: 200 10*3/uL (ref 150–400)
RBC: 3.07 MIL/uL — ABNORMAL LOW (ref 3.87–5.11)
RDW: 15.9 % — ABNORMAL HIGH (ref 11.5–15.5)
WBC: 23.3 10*3/uL — ABNORMAL HIGH (ref 4.0–10.5)
nRBC: 0.2 % (ref 0.0–0.2)

## 2022-11-03 LAB — GLUCOSE, CAPILLARY
Glucose-Capillary: 305 mg/dL — ABNORMAL HIGH (ref 70–99)
Glucose-Capillary: 306 mg/dL — ABNORMAL HIGH (ref 70–99)
Glucose-Capillary: 301 mg/dL — ABNORMAL HIGH (ref 70–99)
Glucose-Capillary: 313 mg/dL — ABNORMAL HIGH (ref 70–99)
Glucose-Capillary: 315 mg/dL — ABNORMAL HIGH (ref 70–99)
Glucose-Capillary: 284 mg/dL — ABNORMAL HIGH (ref 70–99)
Glucose-Capillary: 259 mg/dL — ABNORMAL HIGH (ref 70–99)
Glucose-Capillary: 213 mg/dL — ABNORMAL HIGH (ref 70–99)
Glucose-Capillary: 243 mg/dL — ABNORMAL HIGH (ref 70–99)
Glucose-Capillary: 167 mg/dL — ABNORMAL HIGH (ref 70–99)
Glucose-Capillary: 234 mg/dL — ABNORMAL HIGH (ref 70–99)
Glucose-Capillary: 250 mg/dL — ABNORMAL HIGH (ref 70–99)

## 2022-11-03 LAB — POCT I-STAT 7, (LYTES, BLD GAS, ICA,H+H)
Acid-base deficit: 3 mmol/L — ABNORMAL HIGH (ref 0.0–2.0)
Acid-base deficit: 2 mmol/L (ref 0.0–2.0)
Bicarbonate: 23 mmol/L (ref 20.0–28.0)
Bicarbonate: 23.8 mmol/L (ref 20.0–28.0)
Calcium, Ion: 0.89 mmol/L — CL (ref 1.15–1.40)
Calcium, Ion: 0.88 mmol/L — CL (ref 1.15–1.40)
HCT: 27 % — ABNORMAL LOW (ref 36.0–46.0)
HCT: 27 % — ABNORMAL LOW (ref 36.0–46.0)
Hemoglobin: 9.2 g/dL — ABNORMAL LOW (ref 12.0–15.0)
Hemoglobin: 9.2 g/dL — ABNORMAL LOW (ref 12.0–15.0)
O2 Saturation: 93 %
O2 Saturation: 96 %
Patient temperature: 36.5
Potassium: 3.3 mmol/L — ABNORMAL LOW (ref 3.5–5.1)
Potassium: 3.4 mmol/L — ABNORMAL LOW (ref 3.5–5.1)
Sodium: 130 mmol/L — ABNORMAL LOW (ref 135–145)
Sodium: 130 mmol/L — ABNORMAL LOW (ref 135–145)
TCO2: 24 mmol/L (ref 22–32)
TCO2: 25 mmol/L (ref 22–32)
pCO2 arterial: 43.9 mmHg (ref 32–48)
pCO2 arterial: 43.6 mmHg (ref 32–48)
pH, Arterial: 7.327 — ABNORMAL LOW (ref 7.35–7.45)
pH, Arterial: 7.343 — ABNORMAL LOW (ref 7.35–7.45)
pO2, Arterial: 73 mmHg — ABNORMAL LOW (ref 83–108)
pO2, Arterial: 87 mmHg (ref 83–108)

## 2022-11-03 LAB — BASIC METABOLIC PANEL
Anion gap: 12 (ref 5–15)
BUN: 46 mg/dL — ABNORMAL HIGH (ref 6–20)
CO2: 21 mmol/L — ABNORMAL LOW (ref 22–32)
Calcium: 6.8 mg/dL — ABNORMAL LOW (ref 8.9–10.3)
Chloride: 95 mmol/L — ABNORMAL LOW (ref 98–111)
Creatinine, Ser: 1.4 mg/dL — ABNORMAL HIGH (ref 0.44–1.00)
GFR, Estimated: 50 mL/min — ABNORMAL LOW (ref >60)
Glucose, Bld: 276 mg/dL — ABNORMAL HIGH (ref 70–99)
Potassium: 3 mmol/L — ABNORMAL LOW (ref 3.5–5.1)
Sodium: 128 mmol/L — ABNORMAL LOW (ref 135–145)

## 2022-11-03 LAB — COMPREHENSIVE METABOLIC PANEL
ALT: 26 U/L (ref 0–44)
AST: 29 U/L (ref 15–41)
Albumin: 1.5 g/dL — ABNORMAL LOW (ref 3.5–5.0)
Alkaline Phosphatase: 85 U/L (ref 38–126)
Anion gap: 12 (ref 5–15)
BUN: 35 mg/dL — ABNORMAL HIGH (ref 6–20)
CO2: 20 mmol/L — ABNORMAL LOW (ref 22–32)
Calcium: 6.8 mg/dL — ABNORMAL LOW (ref 8.9–10.3)
Chloride: 95 mmol/L — ABNORMAL LOW (ref 98–111)
Creatinine, Ser: 1.8 mg/dL — ABNORMAL HIGH (ref 0.44–1.00)
GFR, Estimated: 37 mL/min — ABNORMAL LOW (ref >60)
Glucose, Bld: 261 mg/dL — ABNORMAL HIGH (ref 70–99)
Potassium: 3.4 mmol/L — ABNORMAL LOW (ref 3.5–5.1)
Sodium: 127 mmol/L — ABNORMAL LOW (ref 135–145)
Total Bilirubin: 0.9 mg/dL (ref 0.3–1.2)
Total Protein: 5.7 g/dL — ABNORMAL LOW (ref 6.5–8.1)

## 2022-11-03 LAB — HEMOGLOBIN AND HEMATOCRIT, BLOOD
HCT: 23.5 % — ABNORMAL LOW (ref 36.0–46.0)
Hemoglobin: 8.2 g/dL — ABNORMAL LOW (ref 12.0–15.0)

## 2022-11-03 LAB — PREPARE RBC (CROSSMATCH)

## 2022-11-03 LAB — PHOSPHORUS: Phosphorus: 3.9 mg/dL (ref 2.5–4.6)

## 2022-11-03 LAB — HIV ANTIBODY (ROUTINE TESTING W REFLEX): HIV Screen 4th Generation wRfx: NONREACTIVE

## 2022-11-03 LAB — ABO/RH: ABO/RH(D): A POS

## 2022-11-03 LAB — MAGNESIUM: Magnesium: 2.7 mg/dL — ABNORMAL HIGH (ref 1.7–2.4)

## 2022-11-03 LAB — TRIGLYCERIDES: Triglycerides: 709 mg/dL — ABNORMAL HIGH (ref <150)

## 2022-11-03 LAB — LACTIC ACID, PLASMA: Lactic Acid, Venous: 1 mmol/L (ref 0.5–1.9)

## 2022-11-03 SURGERY — IRRIGATION AND DEBRIDEMENT ABSCESS
Anesthesia: General | Site: Vagina

## 2022-11-03 MED ORDER — PROSOURCE TF20 ENFIT COMPATIBL EN LIQD
120.0000 mL | Freq: Two times a day (BID) | ENTERAL | Status: DC
  Administered 2022-11-03 – 2022-11-09 (×11): 120 mL
  Filled 2022-11-03 (×12): qty 120

## 2022-11-03 MED ORDER — PIPERACILLIN-TAZOBACTAM 3.375 G IVPB
3.3750 g | Freq: Three times a day (TID) | INTRAVENOUS | Status: DC
  Administered 2022-11-03 – 2022-11-07 (×12): 3.375 g via INTRAVENOUS
  Filled 2022-11-03 (×12): qty 50

## 2022-11-03 MED ORDER — 0.9 % SODIUM CHLORIDE (POUR BTL) OPTIME
TOPICAL | Status: DC | PRN
  Administered 2022-11-03: 1000 mL

## 2022-11-03 MED ORDER — HEPARIN SODIUM (PORCINE) 5000 UNIT/ML IJ SOLN
7500.0000 [IU] | Freq: Three times a day (TID) | INTRAMUSCULAR | Status: DC
  Administered 2022-11-03 – 2022-11-09 (×20): 7500 [IU] via SUBCUTANEOUS
  Filled 2022-11-03 (×21): qty 2

## 2022-11-03 MED ORDER — POTASSIUM CHLORIDE 20 MEQ PO PACK
40.0000 meq | PACK | Freq: Once | ORAL | Status: AC
  Administered 2022-11-03: 40 meq
  Filled 2022-11-03: qty 2

## 2022-11-03 MED ORDER — PHENYLEPHRINE 80 MCG/ML (10ML) SYRINGE FOR IV PUSH (FOR BLOOD PRESSURE SUPPORT)
PREFILLED_SYRINGE | INTRAVENOUS | Status: DC | PRN
  Administered 2022-11-03: 80 ug via INTRAVENOUS

## 2022-11-03 MED ORDER — CALCIUM GLUCONATE-NACL 1-0.675 GM/50ML-% IV SOLN
1.0000 g | Freq: Once | INTRAVENOUS | Status: AC
  Administered 2022-11-03: 1000 mg via INTRAVENOUS
  Filled 2022-11-03: qty 50

## 2022-11-03 MED ORDER — INSULIN REGULAR(HUMAN) IN NACL 100-0.9 UT/100ML-% IV SOLN
INTRAVENOUS | Status: AC
  Administered 2022-11-03: 13 [IU]/h via INTRAVENOUS
  Administered 2022-11-03: 11 [IU]/h via INTRAVENOUS
  Administered 2022-11-04: 7.5 [IU]/h via INTRAVENOUS
  Filled 2022-11-03 (×3): qty 100

## 2022-11-03 MED ORDER — MIDAZOLAM HCL 2 MG/2ML IJ SOLN
INTRAMUSCULAR | Status: DC | PRN
  Administered 2022-11-03: 2 mg via INTRAVENOUS

## 2022-11-03 MED ORDER — PROSOURCE TF20 ENFIT COMPATIBL EN LIQD: 120.0000 mL | Freq: Two times a day (BID) | ENTERAL | Status: DC

## 2022-11-03 MED ORDER — POTASSIUM CHLORIDE 20 MEQ PO PACK
60.0000 meq | PACK | Freq: Once | ORAL | Status: AC
  Administered 2022-11-03: 60 meq
  Filled 2022-11-03: qty 3

## 2022-11-03 MED ORDER — ROCURONIUM BROMIDE 10 MG/ML (PF) SYRINGE
PREFILLED_SYRINGE | INTRAVENOUS | Status: DC | PRN
  Administered 2022-11-03: 100 mg via INTRAVENOUS

## 2022-11-03 MED ORDER — POTASSIUM CHLORIDE 20 MEQ PO PACK: 40.0000 meq | PACK | Freq: Once | ORAL | Status: DC

## 2022-11-03 MED ORDER — MIDAZOLAM HCL 2 MG/2ML IJ SOLN
INTRAMUSCULAR | Status: AC
  Filled 2022-11-03: qty 2

## 2022-11-03 MED ORDER — SODIUM CHLORIDE 0.9 % IV SOLN: INTRAVENOUS | Status: DC | PRN

## 2022-11-03 MED ORDER — HYDRALAZINE HCL 20 MG/ML IJ SOLN
10.0000 mg | Freq: Four times a day (QID) | INTRAMUSCULAR | Status: DC | PRN
  Administered 2022-11-03: 20 mg via INTRAVENOUS
  Administered 2022-11-04: 10 mg via INTRAVENOUS
  Filled 2022-11-03 (×2): qty 1

## 2022-11-03 MED ORDER — INSULIN DETEMIR 100 UNIT/ML ~~LOC~~ SOLN
4.0000 [IU] | SUBCUTANEOUS | Status: AC
  Administered 2022-11-03: 4 [IU] via SUBCUTANEOUS
  Filled 2022-11-03: qty 0.04

## 2022-11-03 MED ORDER — DEXTROSE 50 % IV SOLN: 0.0000 mL | INTRAVENOUS | Status: DC | PRN

## 2022-11-03 MED ORDER — INSULIN DETEMIR 100 UNIT/ML ~~LOC~~ SOLN
42.0000 [IU] | Freq: Two times a day (BID) | SUBCUTANEOUS | Status: DC
  Filled 2022-11-03 (×2): qty 0.42

## 2022-11-03 MED ORDER — MIDAZOLAM HCL 2 MG/2ML IJ SOLN
2.0000 mg | INTRAMUSCULAR | Status: DC | PRN
  Administered 2022-11-03: 2 mg via INTRAVENOUS
  Administered 2022-11-04 (×2): 4 mg via INTRAVENOUS
  Administered 2022-11-04 (×2): 2 mg via INTRAVENOUS
  Administered 2022-11-07: 4 mg via INTRAVENOUS
  Administered 2022-11-07: 2 mg via INTRAVENOUS
  Administered 2022-11-07: 4 mg via INTRAVENOUS
  Administered 2022-11-08 – 2022-11-09 (×5): 2 mg via INTRAVENOUS
  Administered 2022-11-09: 4 mg via INTRAVENOUS
  Administered 2022-11-09 (×2): 2 mg via INTRAVENOUS
  Filled 2022-11-03 (×6): qty 2
  Filled 2022-11-03 (×2): qty 4
  Filled 2022-11-03: qty 2
  Filled 2022-11-03 (×3): qty 4
  Filled 2022-11-03 (×2): qty 2
  Filled 2022-11-03: qty 4
  Filled 2022-11-03: qty 2

## 2022-11-03 MED ORDER — BUPIVACAINE-EPINEPHRINE (PF) 0.5% -1:200000 IJ SOLN
INTRAMUSCULAR | Status: AC
  Filled 2022-11-03: qty 30

## 2022-11-03 MED ORDER — ALBUMIN HUMAN 5 % IV SOLN: INTRAVENOUS | Status: DC | PRN

## 2022-11-03 MED ORDER — DEXTROSE-NACL 5-0.9 % IV SOLN: INTRAVENOUS | Status: DC

## 2022-11-03 MED ORDER — LACTATED RINGERS IV SOLN: INTRAVENOUS | Status: DC | PRN

## 2022-11-03 MED ORDER — HEMOSTATIC AGENTS (NO CHARGE) OPTIME
TOPICAL | Status: DC | PRN
  Administered 2022-11-03 (×3): 1 via TOPICAL

## 2022-11-03 MED ORDER — HEPARIN SODIUM (PORCINE) 5000 UNIT/ML IJ SOLN
7500.0000 [IU] | Freq: Three times a day (TID) | INTRAMUSCULAR | Status: DC
  Filled 2022-11-03: qty 2

## 2022-11-03 MED ORDER — VITAMIN C 500 MG PO TABS
250.0000 mg | ORAL_TABLET | Freq: Every day | ORAL | Status: DC
  Administered 2022-11-03 – 2022-11-13 (×10): 250 mg
  Filled 2022-11-03 (×10): qty 1

## 2022-11-03 SURGICAL SUPPLY — 33 items
BNDG GAUZE DERMACEA FLUFF 4 (GAUZE/BANDAGES/DRESSINGS) IMPLANT
BNDG GZE DERMACEA 4 6PLY (GAUZE/BANDAGES/DRESSINGS) ×4
CANISTER SUCT 3000ML PPV (MISCELLANEOUS) IMPLANT
CLEANER TIP ELECTROSURG 2X2 (MISCELLANEOUS) ×3 IMPLANT
COVER SURGICAL LIGHT HANDLE (MISCELLANEOUS) ×3 IMPLANT
DRAPE HALF SHEET 40X57 (DRAPES) IMPLANT
DRAPE LAPAROSCOPIC ABDOMINAL (DRAPES) ×3 IMPLANT
DRESSING QUICKCLOT CNTRL ZFOLD (GAUZE/BANDAGES/DRESSINGS) IMPLANT
DRSG QUICKCLOT CNTRL ZFOLD (GAUZE/BANDAGES/DRESSINGS) ×4
ELECT REM PT RETURN 9FT ADLT (ELECTROSURGICAL) ×2
ELECTRODE REM PT RTRN 9FT ADLT (ELECTROSURGICAL) ×3 IMPLANT
GAUZE PAD ABD 8X10 STRL (GAUZE/BANDAGES/DRESSINGS) IMPLANT
GAUZE SPONGE 4X4 12PLY STRL (GAUZE/BANDAGES/DRESSINGS) ×3 IMPLANT
GLOVE BIO SURGEON STRL SZ7 (GLOVE) ×3 IMPLANT
GLOVE BIOGEL PI IND STRL 7.5 (GLOVE) ×3 IMPLANT
GOWN STRL REUS W/ TWL LRG LVL3 (GOWN DISPOSABLE) ×6 IMPLANT
GOWN STRL REUS W/TWL LRG LVL3 (GOWN DISPOSABLE) ×4
KIT BASIN OR (CUSTOM PROCEDURE TRAY) ×3 IMPLANT
KIT FLEXISEAL FECAL MGMNT (GAUZE/BANDAGES/DRESSINGS) IMPLANT
KIT TURNOVER KIT B (KITS) ×3 IMPLANT
NDL HYPO 25GX1X1/2 BEV (NEEDLE) IMPLANT
NEEDLE HYPO 25GX1X1/2 BEV (NEEDLE) IMPLANT
NS IRRIG 1000ML POUR BTL (IV SOLUTION) ×3 IMPLANT
PACK GENERAL/GYN (CUSTOM PROCEDURE TRAY) ×3 IMPLANT
PAD ABD 8X10 STRL (GAUZE/BANDAGES/DRESSINGS) IMPLANT
PAD ARMBOARD 7.5X6 YLW CONV (MISCELLANEOUS) ×6 IMPLANT
PENCIL SMOKE EVACUATOR (MISCELLANEOUS) ×3 IMPLANT
STAPLER VISISTAT 35W (STAPLE) IMPLANT
SUT ETHILON 2 0 FS 18 (SUTURE) IMPLANT
SUT SILK 2 0 SH (SUTURE) IMPLANT
TAPE CLOTH 4X10 WHT NS (GAUZE/BANDAGES/DRESSINGS) IMPLANT
TOWEL GREEN STERILE (TOWEL DISPOSABLE) ×3 IMPLANT
TOWEL GREEN STERILE FF (TOWEL DISPOSABLE) ×3 IMPLANT

## 2022-11-03 NOTE — Progress Notes (Signed)
[facility] Progress Note Patient Name: Lindsey [name] DOB: [birth year] MRN: [id number]   Date of Service  11/10/2022  HPI/Events of Note  Multiple issues: 1. Hypokalemia - K+ = 3.0 and Creatinine = 1.4. 2. Nursing request for dextrose IV infusion to accompany EndoTool.  eICU Interventions  Plan: Will replace K+. D5 0.9 NaCl IV infusion at 75 mL/hour.      Intervention Category Major Interventions: Electrolyte abnormality - evaluation and management  Silviano Neuser Eugene 11/02/2022, 8:11 PM

## 2022-11-03 NOTE — Progress Notes (Signed)
[facility] ADULT ICU REPLACEMENT PROTOCOL   The patient does apply for the [facility] Adult ICU Electrolyte Replacment Protocol based on the criteria listed below:   1.Exclusion criteria: TCTS, ECMO, Dialysis, and Myasthenia Gravis patients 2. Is GFR >/= 30 ml/min? Yes.    Patient's GFR today is 37 3. Is SCr </= 2? Yes.   Patient's SCr is 1.8 mg/dL 4. Did SCr increase >/= 0.5 in 24 hours? No. 5.Pt's weight >40kg  Yes.   6. Abnormal electrolyte(s):   K 3.4  7. Electrolytes replaced per protocol 8.  Call MD STAT for K+ </= 2.5, Phos </= 1, or Mag </= 1 Physician:  S. Rosie Fate R Avalie Oconnor 10/24/2022 1:52 AM

## 2022-11-03 NOTE — Anesthesia Preprocedure Evaluation (Signed)
Anesthesia Evaluation  Patient identified by MRN, date of birth, ID band  Reviewed: Allergy & Precautions, NPO status , Patient's Chart, lab work & pertinent test results, reviewed documented beta blocker date and time   Airway Mallampati: Intubated  TM Distance: >3 FB Neck ROM: Full    Dental  (+) Dental Advisory Given   Pulmonary neg pulmonary ROS, former smoker    + decreased breath sounds  + intubated    Cardiovascular hypertension, Pt. on home beta blockers and Pt. on medications Normal cardiovascular exam Rhythm:Regular Rate:Normal     Neuro/Psych  Headaches PSYCHIATRIC DISORDERS Anxiety Depression    Pseudotumor cerebri    GI/Hepatic negative GI ROS, Neg liver ROS,,,  Endo/Other  diabetes, Type 2, Oral Hypoglycemic AgentsHypothyroidism  Morbid obesity (BMI 71)  Renal/GU Renal disease (AKI)     Musculoskeletal negative musculoskeletal ROS (+)    Abdominal   Peds  Hematology  (+) Blood dyscrasia, anemia   Anesthesia Other Findings Day of surgery medications reviewed with the patient.  Necrotizing fasciitis  Reproductive/Obstetrics                             Anesthesia Physical Anesthesia Plan  ASA: 4  Anesthesia Plan: General   Post-op Pain Management:    Induction: Intravenous  PONV Risk Score and Plan: 3 and Treatment may vary due to age or medical condition  Airway Management Planned: Oral ETT  Additional Equipment: Arterial line and CVP  Intra-op Plan:   Post-operative Plan: Post-operative intubation/ventilation  Informed Consent: I have reviewed the patients History and Physical, chart, labs and discussed the procedure including the risks, benefits and alternatives for the proposed anesthesia with the patient or authorized representative who has indicated his/her understanding and acceptance.     Consent reviewed with POA and History available from chart  only  Plan Discussed with: CRNA  Anesthesia Plan Comments:        Anesthesia Quick Evaluation

## 2022-11-03 NOTE — Inpatient Diabetes Management (Addendum)
Inpatient Diabetes Program Recommendations  AACE/ADA: New Consensus Statement on Inpatient Glycemic Control (2015)  Target Ranges:  Prepandial:   less than 140 mg/dL      Peak postprandial:   less than 180 mg/dL (1-2 hours)      Critically ill patients:  140 - 180 mg/dL   Lab Results  Component Value Date   GLUCAP 306 (H) 11/11/2022   HGBA1C 14.7 (H) Nov 11, 2022    Latest Reference Range & Units 11/02/22 20:11 11/02/22 23:15 11/02/2022 03:26 10/27/2022 07:26  Glucose-Capillary 70 - 99 mg/dL 170 (H) 299 (H) 305 (H) 306 (H)   Diabetes history: DM2 Outpatient Diabetes medications: Amaryl 2 mg, Actos 45 mg  Current orders for Inpatient glycemic control: Levemir 42 units q 12 hrs, Novolog 0-20 units q 4 hrs.  Inpatient Diabetes Program Recommendations:   Consider: -Novolog 4 units q 4 hrs. Tube feed coverage (hold if feeding held or stopped for any reason)  Thank you, Bethena Roys E. Leonides Minder, RN, MSN, CDE  Diabetes Coordinator Inpatient Glycemic Control Team Team Pager [phone] (8am-5pm) 11/04/2022 10:01 AM

## 2022-11-03 NOTE — Transfer of Care (Signed)
Immediate Anesthesia Transfer of Care Note  Patient: Lindsey [name]  Procedure(s) Performed: EXCISIONAL DEBRIDEMENT PERINEAL WOUND (Vagina ) EXCISIONAL DEBRIDEMENT OF BUTTOCK (Buttocks)  Patient Location: ICU  Anesthesia Type:General  Level of Consciousness: Patient remains intubated per anesthesia plan  Airway & Oxygen Therapy: Patient remains intubated per anesthesia plan and Patient placed on Ventilator (see vital sign flow sheet for setting)  Post-op Assessment: Report given to RN and Post -op Vital signs reviewed and stable  Post vital signs: Reviewed and stable  Last Vitals:  Vitals Value Taken Time  BP    Temp    Pulse    Resp    SpO2      Last Pain:  Vitals:   11/21/2022 1133  TempSrc: Oral  PainSc:          Complications: No notable events documented.

## 2022-11-03 NOTE — Progress Notes (Signed)
2 Days Post-Op   Subjective/Chief Complaint: Intubated sedated   Objective: Vital signs in last 24 hours: Temp:  [98.5 F (36.9 C)-100 F (37.8 C)] 98.7 F (37.1 C) (01/11 0729) Pulse Rate:  [75-89] 79 (01/11 0615) Resp:  [18-38] 24 (01/11 0615) BP: (104-158)/(44-57) 158/53 (01/11 0437) SpO2:  [91 %-99 %] 96 % (01/11 0756) Arterial Line BP: (104-167)/(45-62) 150/52 (01/11 0615) FiO2 (%):  [30 %-50 %] 30 % (01/11 0756) Last BM Date :  (PTA)  Intake/Output from previous day: 01/10 0701 - 01/11 0700 In: 7265.4 [I.V.:5706.9; NG/GT:220.7; IV Piggyback:1337.8] Out: 880 [Urine:880] Intake/Output this shift: No intake/output data recorded.  Gen: sedated, critically ill on vent Heart: regular Lungs: on vent, coarse bs Abd: morbidly obese GU: Unable to see wound currently due to laying on her back but also due to body habitus.  for OR today  Lab Results:  Recent Labs    11/02/22 0500 11/02/22 0801 10/30/2022 0313  WBC 29.3*  --  23.3*  HGB 10.2* 11.6* 8.7*  HCT 30.7* 34.0* 25.6*  PLT 265  --  200   BMET Recent Labs    11/02/22 1503 10/30/2022 0000  NA 130* 127*  K 3.4* 3.4*  CL 99 95*  CO2 21* 20*  GLUCOSE 192* 261*  BUN 30* 35*  CREATININE 1.78* 1.80*  CALCIUM 6.8* 6.8*   PT/INR No results for input(s): "LABPROT", "INR" in the last 72 hours. ABG Recent Labs    11/02/22 0330 11/02/22 0801  PHART 7.079* 7.187*  HCO3 20.4 21.5    Studies/Results: DG Abd 1 View  Result Date: 11/02/2022 CLINICAL DATA:  Orogastric tube placement EXAM: ABDOMEN - 1 VIEW COMPARISON:  CT abdomen 11-09-2022 FINDINGS: The orogastric tube side port and distal tip are in the stomach body. Mild cardiomegaly noted. IMPRESSION: 1. Orogastric tube side port and distal tip are in the stomach body. Electronically Signed   By: Van Clines M.D.   On: 11/02/2022 14:45   DG CHEST PORT 1 VIEW  Result Date: 11/02/2022 CLINICAL DATA:  Central venous catheter placement EXAM: PORTABLE CHEST 1  VIEW COMPARISON:  11-09-22 FINDINGS: The examination is limited by lordotic positioning and the pannus overlying the lung bases. Right internal jugular central venous catheter is in place with its tip overlying the expected superior vena cava. Endotracheal tube seen 5.4 cm above the carina with its tip at the level of the clavicular heads. Lung volumes are small with developing opacity within the upper lobes bilaterally in keeping with progressive atelectasis or developing infiltrate on this limited examination. No pneumothorax or pleural effusion. Cardiac size is mildly enlarged, likely stable when accounting for patient positioning. No acute bone abnormality. IMPRESSION: 1. Right internal jugular central venous catheter tip within the superior vena cava. No pneumothorax. 2. Endotracheal tube in appropriate position. 3. Pulmonary hypoinflation. Developing bilateral upper lobe opacities may reflect atelectasis or developing inflammatory infiltrate. Electronically Signed   By: Fidela Salisbury M.D.   On: 11/02/2022 02:30   CT ABDOMEN PELVIS W CONTRAST  Result Date: November 09, 2022 CLINICAL DATA:  Elevated blood sugar. EXAM: CT ABDOMEN AND PELVIS WITH CONTRAST TECHNIQUE: Multidetector CT imaging of the abdomen and pelvis was performed using the standard protocol following bolus administration of intravenous contrast. RADIATION DOSE REDUCTION: This exam was performed according to the departmental dose-optimization program which includes automated exposure control, adjustment of the mA and/or kV according to patient size and/or use of iterative reconstruction technique. CONTRAST:  18mL OMNIPAQUE IOHEXOL 350 MG/ML SOLN COMPARISON:  None  Available. FINDINGS: Lower chest: Mild linear atelectasis is seen within the left lung base. Hepatobiliary: There is diffuse fatty infiltration of the liver parenchyma. No focal liver abnormality is seen. The gallbladder is contracted. No gallstones, gallbladder wall thickening, or biliary  dilatation. Pancreas: Unremarkable. No pancreatic ductal dilatation or surrounding inflammatory changes. Spleen: Normal in size without focal abnormality. Adrenals/Urinary Tract: Adrenal glands are unremarkable. Kidneys are normal, without renal calculi, focal lesion, or hydronephrosis. Bladder is unremarkable. Stomach/Bowel: Stomach is within normal limits. Appendix appears normal. No evidence of bowel wall thickening, distention, or inflammatory changes. Vascular/Lymphatic: No significant vascular findings are present. No enlarged abdominal or pelvic lymph nodes. Reproductive: Uterus and bilateral adnexa are unremarkable. Other: No abdominal wall hernia. A mild-to-moderate amount of subcutaneous and para muscular inflammatory fat stranding is seen along the anterolateral aspect of the abdominal wall on the left. No abdominopelvic ascites. Musculoskeletal: A large amount of soft tissue air is seen along the perineum on the right and within the medial and posteromedial aspects of the right gluteal region. A moderate amount of associated inflammatory fat stranding is also seen. No acute osseous abnormalities are identified. IMPRESSION: 1. 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 2. Hepatic steatosis. Electronically Signed   By: Virgina Norfolk M.D.   On: 11/16/2022 20:11   CT Angio Chest PE W and/or Wo Contrast  Result Date: 11/23/2022 CLINICAL DATA:  Hypoxia and shortness of breath. EXAM: CT ANGIOGRAPHY CHEST WITH CONTRAST TECHNIQUE: Multidetector CT imaging of the chest was performed using the standard protocol during bolus administration of intravenous contrast. Multiplanar CT image reconstructions and MIPs were obtained to evaluate the vascular anatomy. RADIATION DOSE REDUCTION: This exam was performed according to the departmental dose-optimization program which includes automated exposure  control, adjustment of the mA and/or kV according to patient size and/or use of iterative reconstruction technique. CONTRAST:  136mL OMNIPAQUE IOHEXOL 350 MG/ML SOLN COMPARISON:  None Available. FINDINGS: Cardiovascular: The thoracic aorta is normal in appearance. Satisfactory opacification of the pulmonary arteries to the segmental level. No evidence of pulmonary embolism. There is mild cardiomegaly. No pericardial effusion. Mediastinum/Nodes: No enlarged mediastinal, hilar, or axillary lymph nodes. Thyroid gland, trachea, and esophagus demonstrate no significant findings. Lungs/Pleura: Mild lingular and left basilar linear atelectasis is seen. There is no evidence of focal consolidation, pleural effusion or pneumothorax. Upper Abdomen: There is diffuse fatty infiltration of the liver parenchyma. Musculoskeletal: No chest wall abnormality. No acute or significant osseous findings. Review of the MIP images confirms the above findings. IMPRESSION: 1. No evidence of pulmonary embolism or other acute intrathoracic process. 2. Mild lingular and left basilar linear atelectasis. 3. Hepatic steatosis. Electronically Signed   By: Virgina Norfolk M.D.   On: 11/14/2022 19:56   DG Chest 1 View  Result Date: 11/08/2022 CLINICAL DATA:  Shortness of breath. EXAM: CHEST  1 VIEW COMPARISON:  03/29/2015 FINDINGS: Limited detail due to patient's size and portable AP technique. Mild cardiomegaly. The lung apices are clear. No visible pleural effusion. Consider two-view chest radiography if concern persists. IMPRESSION: Limited detail due to patient's size and portable AP technique. Mild cardiomegaly. No visible pleural effusion. Electronically Signed   By: Nelson Chimes M.D.   On: 10/31/2022 18:28    Anti-infectives: Anti-infectives (From admission, onward)    Start     Dose/Rate Route Frequency Ordered Stop   11/02/22 1400  linezolid (ZYVOX) IVPB 600 mg  600 mg 300 mL/hr over 60 Minutes Intravenous Every 12 hours  11/02/22 1222     11/02/22 1000  vancomycin (VANCOREADY) IVPB 1250 mg/250 mL  Status:  Discontinued        1,250 mg 166.7 mL/hr over 90 Minutes Intravenous Every 12 hours 11-19-2022 2135 11/19/22 2137   11/02/22 1000  vancomycin (VANCOREADY) IVPB 1500 mg/300 mL  Status:  Discontinued        1,500 mg 150 mL/hr over 120 Minutes Intravenous Every 12 hours 11-19-2022 2137 11/02/22 1222   11/02/22 0300  ceFEPIme (MAXIPIME) 2 g in sodium chloride 0.9 % 100 mL IVPB  Status:  Discontinued        2 g 200 mL/hr over 30 Minutes Intravenous Every 8 hours November 19, 2022 2135 November 19, 2022 2258   11/02/22 0045  vancomycin (VANCOREADY) IVPB 2000 mg/400 mL        2,000 mg 200 mL/hr over 120 Minutes Intravenous  Once 11/02/22 0030 11/02/22 0305   11-19-22 2315  piperacillin-tazobactam (ZOSYN) IVPB 3.375 g        3.375 g 12.5 mL/hr over 240 Minutes Intravenous Every 8 hours 11-19-2022 2306     Nov 19, 2022 2315  clindamycin (CLEOCIN) IVPB 900 mg  Status:  Discontinued        900 mg 100 mL/hr over 30 Minutes Intravenous Every 8 hours 19-Nov-2022 2306 11/02/22 1222   November 19, 2022 1930  vancomycin (VANCOCIN) IVPB 1000 mg/200 mL premix  Status:  Discontinued       See Hyperspace for full Linked Orders Report.   1,000 mg 200 mL/hr over 60 Minutes Intravenous  Once 2022/11/19 1823 11/02/22 0029   11/19/22 1830  ceFEPIme (MAXIPIME) 2 g in sodium chloride 0.9 % 100 mL IVPB        2 g 200 mL/hr over 30 Minutes Intravenous  Once 11-19-22 1819 2022/11/19 2016   November 19, 2022 1830  metroNIDAZOLE (FLAGYL) IVPB 500 mg        500 mg 100 mL/hr over 60 Minutes Intravenous  Once 2022/11/19 1819 2022-11-19 2130   11/19/22 1830  vancomycin (VANCOCIN) IVPB 1000 mg/200 mL premix  Status:  Discontinued        1,000 mg 200 mL/hr over 60 Minutes Intravenous  Once 19-Nov-2022 1819 11/19/22 1823   11-19-22 1830  vancomycin (VANCOCIN) IVPB 1000 mg/200 mL premix  Status:  Discontinued       See Hyperspace for full Linked Orders Report.   1,000 mg 200 mL/hr over 60  Minutes Intravenous  Once 11-19-2022 1823 11/02/22 0029       Assessment/Plan: POD 1 s/p debridement of buttock NSTI Dr. Derrell Lolling 1/10 -leave packing in place today -OR today -will plan for return to OR Saturday as well given how ill the patient is and the likelihood that she will need multiple debridements -cont abx therapy, prelim cx  MODERATE GRAM POSITIVE COCCI IN PAIRS AND CHAINS  MODERATE GRAM NEGATIVE RODS    FEN - NPO on vent, can start tube feeds when ok with CCM VTE - heparin ID - Clindamycin, Zosyn, Vanc   Septic shock - secondary to above.  Appreciate CCM's care of this patient as she is critically ill ARF - cr 1.80 DM - on insulin gtt  Morbid obesity - BMI 72 Hypothryoidism PCOS HLD Anxiety/Depression  Emelia Loron 11/21/2022

## 2022-11-03 NOTE — Progress Notes (Signed)
Patient transported from OR to 2M05 on ventilator with no complications noted.

## 2022-11-03 NOTE — Op Note (Addendum)
PRE-OPERATIVE DIAGNOSIS:  NSTI POST-OPERATIVE DIAGNOSIS: NSTI   PROCEDURE:  Procedure(s): IRRIGATION AND DEBRIDEMENT BUTTOCKS/perineum  20x15x6cm area of exsicion previously This procedure: anteriorly 30x15x10 cm, posteriorly 18x5x5 cm   Surgeon: Dr Serita Grammes ANESTHESIA:   general EBL:  200 mL DRAINS: none  Specimens none COUNTS:  correct times two  dispo recovery stable.    Indication: Patient is a 37 year old female, history of diabetes, morbid obesity and right gluteal infection. She had an NSTI that was debrided by Dr Rosendo Gros less than 48 hours ago. She remains ill in the ICU. We discussed repeat debridement today.       Details of procedure: After the patient was consented from her family she was taken back to the OR and placed in supine position with bilateral SCDs in place.  she was already intubated she was initially placed in lithotomy position and appropriately padded.  Patient is prepped and draped standard fashion.  A timeout was called.    I first debrided anteriorly in the dimensions above.  I extended the incision onto the labia and excised a fair amount of more skin and subq tissue. This was taken to bleeding healthy tissue.  I took care not to enter the rectum or vagina. I also placed a flexiseal at the same time.  I placed two z-folds in this as it was bleeding.  I secured these with nylon and these will have to be removed next time to the OR.  I then packed the remainder and we flipped her prone.  I extended this to the dimensions above now and debrided a lot more subq tissue at the same time.  This was then packed with a  z fold and kerlix.  She tolerated this well and was transferred to the ICU in critical condition. She will be returned to the OR on Saturday morning     CASE DATA:   Type of patient?: DOW CASE (Surgical Hospitalist [facility])   Status of Case? EMERGENT Add On   Infection Present At Time Of Surgery (PATOS)?   Necrotizing soft tissue  infection     Excisional debridement:     2.  Tool used for debridement (curette, scapel, etc.) Kocher and electrocautery   3.  Frequency of surgical debridement.   second debridement   4.  Measurement of total devitalized tissue (wound surface) before and after surgical debridement.   20 x 15 x 6---anteriorly 30x15x10 cm, posteriorly 18x5x5 cm   5.  Area and depth of devitalized tissue removed from wound.  8 cm       7.  Evidence of the progress of the wound's response to treatment.             A.  Current wound volume (current dimensions and depth). anteriorly 30x15x10 cm, posteriorly 18x5x5 cm             B.  Presence (and extent of) of infection.  Yes             C.  Presence (and extent of) of non viable tissue.  As above             D.  Other material in the wound that is expected to inhibit healing.  No   8.  Was there any viable tissue removed (measurements): No      PLAN OF CARE: back to ICU   PATIENT DISPOSITION:  ICU - intubated and critically ill.   Delay start of Pharmacological VTE agent (>24hrs)  due to surgical blood loss or risk of bleeding: yes

## 2022-11-03 NOTE — Progress Notes (Signed)
Pt hypertensive SBP reaching 190s, 20mg  of IV hydrazine has been given. No new orders per MD

## 2022-11-03 NOTE — Anesthesia Postprocedure Evaluation (Signed)
Anesthesia Post Note  Patient: Lindsey [name]  Procedure(s) Performed: EXCISIONAL DEBRIDEMENT PERINEAL WOUND (Vagina ) EXCISIONAL DEBRIDEMENT OF BUTTOCK (Buttocks)     Patient location during evaluation: SICU Anesthesia Type: General Level of consciousness: sedated Pain management: pain level controlled Vital Signs Assessment: post-procedure vital signs reviewed and stable Respiratory status: patient remains intubated per anesthesia plan Cardiovascular status: stable Postop Assessment: no apparent nausea or vomiting Anesthetic complications: no  No notable events documented.  Last Vitals:  Vitals:   11/17/2022 1600 November 05, 2022 1638  BP:  (!) 170/47  Pulse: 76   Resp:    Temp:    SpO2: 97%     Last Pain:  Vitals:   11/01/2022 1543  TempSrc: Axillary  PainSc:                  Santa Lighter

## 2022-11-03 NOTE — Progress Notes (Signed)
NAME:  Lindsey [name], MRN:  [id number], DOB:  1986-07-01, LOS: 2 ADMISSION DATE:  11/11/2022, CONSULTATION DATE:  11/08/2022 REFERRING MD:  Alvino Chapel CHIEF COMPLAINT:  Fatigue, buttock wounds   History of Present Illness:  Lindsey [name] is a 37 y.o. female who has a PMH as below including DM2. She presented to [facility] 1/9 with fatigue, dyspnea, buttock wounds that had been getting worse. First noticed buttock wound around thanksgiving but it healed on its own. Then recurred around new years and has persisted and worsened to the point of malodorous drainage. She has also had fatigue and dyspnea. She apparently had been eating badly around the holidays and her CBG's had been reading high. Normally she states that CBG's are well controlled.  In ED, she was found to have HHS. 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  While at [facility], she also developed A.Fib RVR for which she received Amiodarone bolus x 2 followed by infusion. She was given one dose of Flagyl and was transferred to [facility] ED for surgical evaluation. En route to [facility], she converted to NSR. BP has been stable since ED presentation.  She was evaluated by surgery and is being taken to the OR for debridement tonight. She is currently on Insulin infusion, Amiodarone infusion, potassium supplementation. Her mother is with her at the bedside.  Pertinent  Medical History:  has Pseudotumor cerebri; Obesity, morbid ([place]); Vision disturbance; Idiopathic intracranial hypertension; Type 2 diabetes mellitus with other specified complication ([place]); Anxiety; Depression; High cholesterol; Hypertension; Hypothyroidism; Major depression in partial remission ([place]); Panic disorder; Morbid obesity ([place]); Major depressive disorder, recurrent episode, severe ([place]); Panic disorder with agoraphobia; Hyperosmolar hyperglycemic state (HHS) ([place]); Fournier's  gangrene in female [facility]); and Atrial fibrillation with RVR ([place]) on their problem list.  Significant Hospital Events: Including procedures, antibiotic start and stop dates in addition to other pertinent events   CT abdomen pelvis 1/9 > basilar atelectasis, normal stomach, appendix, bowel wall, no hernia, mild to moderate subcutaneous impaired muscular inflammatory fat stranding anterolateral aspect abdominal wall on the left, no ascites.  Soft tissue gas along the perineum on the right with medial and posterior medial right gluteal involvement consistent with Fournier's gangrene OR for debridement 1/9 Blood cultures 1/9 >  Urine culture 1/9 > yeast 70 K Right buttock wound culture 1/10 > GPC, GNR >   Interim History / Subjective:  Off pressors Remains on fentanyl 200 Bicarbonate infusion 100 cc/h FiO2 0.30, PEEP 10 Sinus rhythm I/O+ 12.6 L total Vanco/clinda changed to linezolid on 1/10, remains on Zosyn  Objective:  Blood pressure (!) 118/41, pulse 79, temperature 98.7 F (37.1 C), temperature source Oral, resp. rate (!) 35, height 5\' 6"  (1.676 m), weight (!) 201.9 kg, SpO2 96 %. CVP:  [0 mmHg-20 mmHg] 20 mmHg  Vent Mode: PRVC FiO2 (%):  [30 %-50 %] 30 % Set Rate:  [35 bmp] 35 bmp Vt Set:  [470 mL-480 mL] 480 mL PEEP:  [10 cmH20] 10 cmH20 Plateau Pressure:  [21 cmH20-32 cmH20] 31 cmH20   Intake/Output Summary (Last 24 hours) at 10/26/2022 0945 Last data filed at 11/08/2022 0800 Gross per 24 hour  Intake 6705.3 ml  Output 880 ml  Net 5825.3 ml   Filed Weights   11/14/2022 1734 11/10/2022 1858  Weight: (!) 204.1 kg (!) 201.9 kg    Examination: General: Obese woman, critically ill, sedated and intubated Neuro:  Sedated, HEENT: [place]/AT. Sclerae anicteric. EOMI. MM dry. Cardiovascular: RRR, no M/R/G.  Lungs: Respirations even and unlabored.  CTA bilaterally, No W/R/R. Abdomen: Morbidly obese. BS x 4, soft, NT/ND.  Musculoskeletal: No gross deformities, no edema.  Skin: R buttock  with errythema and multiple wounds with drainage. Skin otherwise warm, no rashes.   Assessment & Plan:   HHS - presumed 2/2 Fournier's Gangrene. Hx DM 2. -Able to transition off insulin infusion on 1/11, has remained hyperglycemic but anion gap and CO2 remain acceptable.  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 -Appreciate surgery debridement and management.  Plan to go back to the operating room today 1/11 also probably on 1/13 -Continue Zosyn -Clindamycin/vancomycin transitioned over to linezolid on 1/10 -Following wound cultures, blood and urine cultures  Septic shock due to Fournier's gangrene -Follow I's/O, appears to be adequately volume resuscitated -Norepinephrine has been weaned off -Antibiotic as above -Surgical care as above  Acute metabolic encephalopathy due to acute infection with septic shock, HHS -Supportive care -Continue fentanyl infusion, Versed as needed.  Goal propofol to off, especially since she has hypertriglyceridemia.  May need to add Precedex  A.fib with RVR - 2/2 above, converted to NSR after Amiodarone. Hx HTN, HLD. -Discontinue amiodarone infusion on 1/11 -Home pravastatin, propranolol, Diamox, irbesartan are all on hold  Pseudohyponatremia - 2/2 Hyperglycemia. Hypokalemia - being repleted. AKI. NAGMA, multifactorial but principally due to HHS.  Lactic acid is cleared Hypocalcemia. -Continue to follow BMP -Supplement as indicated -Follow urine output, BMP  Hx Hypothyroidism. -Continue home levothyroxine  Hx Anxiety, Depression, Panic disorder, PTSD. -Hold benzos -Will work to clarify her home antidepression regimen and restart as able  Building surveyor (evaluated daily):  Diet/type: NPO DVT prophylaxis: prophylactic heparin  GI prophylaxis: PPI Lines: Central  line and Arterial Line Foley:  Yes, and it is still needed Code Status:  full code Last date of multidisciplinary goals of care discussion: Pending Family: Updated mother at bedside 1/11  Labs   CBC: Recent Labs  Lab 11/09/2022 1811 10/27/2022 2024 11/02/22 0214 11/02/22 0330 11/02/22 0500 11/02/22 0801 11/19/2022 0313  WBC 22.8*  --   --   --  29.3*  --  23.3*  NEUTROABS 18.4*  --   --   --   --   --   --   HGB 11.3*   < > 11.9* 11.6* 10.2* 11.6* 8.7*  HCT 34.0*   < > 35.0* 34.0* 30.7* 34.0* 25.6*  MCV 83.1  --   --   --  84.1  --  83.4  PLT 240  --   --   --  265  --  200   < > = values in this interval not displayed.    Basic Metabolic Panel: Recent Labs  Lab 10/24/2022 2032 11/02/22 0214 11/02/22 0242 11/02/22 0330 11/02/22 0500 11/02/22 0801 11/02/22 1503 11/09/2022 0000 11/20/2022 0313  NA 125*   < > 125* 132* 128* 132* 130* 127*  --   K 3.4*   < > 3.2* 3.4* 3.4* 3.7 3.4* 3.4*  --   CL 95*  --  96*  --  96*  --  99 95*  --   CO2 16*  --  19*  --  20*  --  21* 20*  --   GLUCOSE 531*  --  316*  --  262*  --  192* 261*  --   BUN 30*  --  28*  --  29*  --  30* 35*  --   CREATININE 1.16*  --  1.34*  --  1.44*  --  1.78* 1.80*  --   CALCIUM 7.5*  --  7.2*  --  7.3*  --  6.8* 6.8*  --   MG  --   --   --   --   --   --   --   --  2.7*  PHOS  --   --   --   --   --   --   --   --  3.9   < > = values in this interval not displayed.   GFR: Estimated Creatinine Clearance: 79.3 mL/min (A) (by C-G formula based on SCr of 1.8 mg/dL (H)). Recent Labs  Lab 11/21/2022 1811 11/14/2022 1942 11/02/22 0500 11/02/22 0930 11/15/2022 0313 10/24/2022 0315  WBC 22.8*  --  29.3*  --  23.3*  --   LATICACIDVEN 3.5* 2.9*  --  1.6  --  1.0    Liver Function Tests: Recent Labs  Lab 11/21/2022 0000  AST 29  ALT 26  ALKPHOS 85  BILITOT 0.9  PROT 5.7*  ALBUMIN 1.5*   No results for input(s): "LIPASE", "AMYLASE" in the last 168 hours. No results for input(s): "AMMONIA" in the last 168  hours.  ABG    Component Value Date/Time   PHART 7.187 (LL) 11/02/2022 0801   PCO2ART 57.0 (H) 11/02/2022 0801   PO2ART 162 (H) 11/02/2022 0801   HCO3 21.5 11/02/2022 0801   TCO2 23 11/02/2022 0801   ACIDBASEDEF 7.0 (H) 11/02/2022 0801   O2SAT 99 11/02/2022 0801     Coagulation Profile: No results for input(s): "INR", "PROTIME" in the last 168 hours.  Cardiac Enzymes: No results for input(s): "CKTOTAL", "CKMB", "CKMBINDEX", "TROPONINI" in the last 168 hours.  HbA1C: Hgb A1c MFr Bld  Date/Time Value Ref Range Status  11/02/2022 10:42 PM 14.7 (H) 4.8 - 5.6 % Final    Comment:    (NOTE) Pre diabetes:          5.7%-6.4%  Diabetes:              >6.4%  Glycemic control for   <7.0% adults with diabetes     CBG: Recent Labs  Lab 11/02/22 1902 11/02/22 2011 11/02/22 2315 10/28/2022 0326 11/19/2022 0726  GLUCAP 188* 170* 299* 305* 306*     Critical care time: 35 min.   Levy Pupa, MD, PhD 11/02/2022, 9:45 AM [facility] [phone] or if no answer before 7:00PM call [phone] For any issues after 7:00PM please call eLink [phone]

## 2022-11-03 NOTE — Progress Notes (Signed)
Pharmacy Electrolyte Replacement  Recent Labs:  Recent Labs    10/30/2022 0000 11/18/2022 0313  K 3.4*  --   MG  --  2.7*  PHOS  --  3.9  CREATININE 1.80*  --     Low Critical Values (K </= 2.5, Phos </= 1, Mg </= 1) Present: None  MD Contacted: n/a  Plan: Patient received Kcl 40 mEq per tube on 1/11 at 0200. Will give an additional Kcl 40 mEq per tube x1. Follow repeat electrolytes

## 2022-11-04 ENCOUNTER — Inpatient Hospital Stay: Payer: Self-pay

## 2022-11-04 LAB — BASIC METABOLIC PANEL
Anion gap: 11 (ref 5–15)
Anion gap: 9 (ref 5–15)
BUN: 45 mg/dL — ABNORMAL HIGH (ref 6–20)
BUN: 42 mg/dL — ABNORMAL HIGH (ref 6–20)
CO2: 24 mmol/L (ref 22–32)
CO2: 22 mmol/L (ref 22–32)
Calcium: 6.5 mg/dL — ABNORMAL LOW (ref 8.9–10.3)
Calcium: 6.3 mg/dL — CL (ref 8.9–10.3)
Chloride: 97 mmol/L — ABNORMAL LOW (ref 98–111)
Chloride: 103 mmol/L (ref 98–111)
Creatinine, Ser: 1.13 mg/dL — ABNORMAL HIGH (ref 0.44–1.00)
Creatinine, Ser: 1.48 mg/dL — ABNORMAL HIGH (ref 0.44–1.00)
GFR, Estimated: >60 mL/min (ref >60)
GFR, Estimated: 47 mL/min — ABNORMAL LOW (ref >60)
Glucose, Bld: 148 mg/dL — ABNORMAL HIGH (ref 70–99)
Glucose, Bld: 136 mg/dL — ABNORMAL HIGH (ref 70–99)
Potassium: 3.2 mmol/L — ABNORMAL LOW (ref 3.5–5.1)
Potassium: 4.1 mmol/L (ref 3.5–5.1)
Sodium: 132 mmol/L — ABNORMAL LOW (ref 135–145)
Sodium: 134 mmol/L — ABNORMAL LOW (ref 135–145)

## 2022-11-04 LAB — GLUCOSE, CAPILLARY
Glucose-Capillary: 118 mg/dL — ABNORMAL HIGH (ref 70–99)
Glucose-Capillary: 132 mg/dL — ABNORMAL HIGH (ref 70–99)
Glucose-Capillary: 142 mg/dL — ABNORMAL HIGH (ref 70–99)
Glucose-Capillary: 150 mg/dL — ABNORMAL HIGH (ref 70–99)
Glucose-Capillary: 150 mg/dL — ABNORMAL HIGH (ref 70–99)
Glucose-Capillary: 154 mg/dL — ABNORMAL HIGH (ref 70–99)
Glucose-Capillary: 154 mg/dL — ABNORMAL HIGH (ref 70–99)
Glucose-Capillary: 157 mg/dL — ABNORMAL HIGH (ref 70–99)
Glucose-Capillary: 160 mg/dL — ABNORMAL HIGH (ref 70–99)
Glucose-Capillary: 183 mg/dL — ABNORMAL HIGH (ref 70–99)
Glucose-Capillary: 187 mg/dL — ABNORMAL HIGH (ref 70–99)
Glucose-Capillary: 200 mg/dL — ABNORMAL HIGH (ref 70–99)
Glucose-Capillary: 211 mg/dL — ABNORMAL HIGH (ref 70–99)
Glucose-Capillary: 214 mg/dL — ABNORMAL HIGH (ref 70–99)
Glucose-Capillary: 222 mg/dL — ABNORMAL HIGH (ref 70–99)
Glucose-Capillary: 235 mg/dL — ABNORMAL HIGH (ref 70–99)
Glucose-Capillary: 82 mg/dL (ref 70–99)

## 2022-11-04 LAB — CBC
HCT: 22.7 % — ABNORMAL LOW (ref 36.0–46.0)
Hemoglobin: 7.6 g/dL — ABNORMAL LOW (ref 12.0–15.0)
MCH: 28 pg (ref 26.0–34.0)
MCHC: 33.5 g/dL (ref 30.0–36.0)
MCV: 83.8 fL (ref 80.0–100.0)
Platelets: 217 10*3/uL (ref 150–400)
RBC: 2.71 MIL/uL — ABNORMAL LOW (ref 3.87–5.11)
RDW: 15.7 % — ABNORMAL HIGH (ref 11.5–15.5)
WBC: 27.9 10*3/uL — ABNORMAL HIGH (ref 4.0–10.5)
nRBC: 0.4 % — ABNORMAL HIGH (ref 0.0–0.2)

## 2022-11-04 LAB — POCT I-STAT 7, (LYTES, BLD GAS, ICA,H+H)
Acid-Base Excess: 2 mmol/L (ref 0.0–2.0)
Bicarbonate: 26.8 mmol/L (ref 20.0–28.0)
Calcium, Ion: 0.9 mmol/L — ABNORMAL LOW (ref 1.15–1.40)
HCT: 22 % — ABNORMAL LOW (ref 36.0–46.0)
Hemoglobin: 7.5 g/dL — ABNORMAL LOW (ref 12.0–15.0)
O2 Saturation: 99 %
Patient temperature: 99.6
Potassium: 3.4 mmol/L — ABNORMAL LOW (ref 3.5–5.1)
Sodium: 135 mmol/L (ref 135–145)
TCO2: 28 mmol/L (ref 22–32)
pCO2 arterial: 44.9 mmHg (ref 32–48)
pH, Arterial: 7.386 (ref 7.35–7.45)
pO2, Arterial: 137 mmHg — ABNORMAL HIGH (ref 83–108)

## 2022-11-04 LAB — MAGNESIUM: Magnesium: 2.6 mg/dL — ABNORMAL HIGH (ref 1.7–2.4)

## 2022-11-04 LAB — TRIGLYCERIDES: Triglycerides: 665 mg/dL — ABNORMAL HIGH (ref <150)

## 2022-11-04 LAB — PHOSPHORUS: Phosphorus: 2.4 mg/dL — ABNORMAL LOW (ref 2.5–4.6)

## 2022-11-04 MED ORDER — LEVOTHYROXINE SODIUM 25 MCG PO TABS
75.0000 ug | ORAL_TABLET | Freq: Every day | ORAL | Status: DC
  Administered 2022-11-05 – 2022-11-13 (×9): 75 ug
  Filled 2022-11-04 (×9): qty 3

## 2022-11-04 MED ORDER — VITAL 1.5 CAL PO LIQD
1000.0000 mL | ORAL | Status: DC
  Administered 2022-11-04: 1000 mL

## 2022-11-04 MED ORDER — POTASSIUM CHLORIDE 10 MEQ/100ML IV SOLN
10.0000 meq | INTRAVENOUS | Status: AC
  Administered 2022-11-04 (×2): 10 meq via INTRAVENOUS
  Filled 2022-11-04: qty 100

## 2022-11-04 MED ORDER — HYDRALAZINE HCL 20 MG/ML IJ SOLN
10.0000 mg | INTRAMUSCULAR | Status: DC | PRN
  Administered 2022-11-07: 10 mg via INTRAVENOUS
  Filled 2022-11-04: qty 1

## 2022-11-04 MED ORDER — POTASSIUM CHLORIDE 20 MEQ PO PACK
20.0000 meq | PACK | ORAL | Status: AC
  Administered 2022-11-04 (×2): 20 meq
  Filled 2022-11-04 (×2): qty 1

## 2022-11-04 MED ORDER — VITAL 1.5 CAL PO LIQD
1000.0000 mL | ORAL | Status: DC
  Administered 2022-11-04 – 2022-11-06 (×2): 1000 mL

## 2022-11-04 MED ORDER — SODIUM CHLORIDE 0.9% FLUSH
10.0000 mL | Freq: Two times a day (BID) | INTRAVENOUS | Status: DC
  Administered 2022-11-04: 30 mL
  Administered 2022-11-05 – 2022-11-20 (×30): 10 mL
  Administered 2022-11-21: 20 mL
  Administered 2022-11-21 – 2022-11-23 (×5): 10 mL
  Administered 2022-11-24: 20 mL
  Administered 2022-11-24: 10 mL
  Administered 2022-11-25: 20 mL

## 2022-11-04 MED ORDER — NOREPINEPHRINE 4 MG/250ML-% IV SOLN
INTRAVENOUS | Status: AC
  Filled 2022-11-04: qty 250

## 2022-11-04 MED ORDER — DEXMEDETOMIDINE HCL IN NACL 400 MCG/100ML IV SOLN
0.0000 ug/kg/h | INTRAVENOUS | Status: DC
  Administered 2022-11-04: 1.8 ug/kg/h via INTRAVENOUS
  Administered 2022-11-04: 1 ug/kg/h via INTRAVENOUS
  Administered 2022-11-04 (×2): 1.8 ug/kg/h via INTRAVENOUS
  Administered 2022-11-04 (×3): 1.5 ug/kg/h via INTRAVENOUS
  Administered 2022-11-04 (×4): 1.8 ug/kg/h via INTRAVENOUS
  Administered 2022-11-04: 1.5 ug/kg/h via INTRAVENOUS
  Administered 2022-11-04 – 2022-11-05 (×2): 1.8 ug/kg/h via INTRAVENOUS
  Administered 2022-11-05: 1.2 ug/kg/h via INTRAVENOUS
  Administered 2022-11-05: 1 ug/kg/h via INTRAVENOUS
  Administered 2022-11-05 (×4): 1.8 ug/kg/h via INTRAVENOUS
  Administered 2022-11-05: 1.2 ug/kg/h via INTRAVENOUS
  Administered 2022-11-05 (×2): 1.8 ug/kg/h via INTRAVENOUS
  Filled 2022-11-04 (×6): qty 100
  Filled 2022-11-04 (×2): qty 200
  Filled 2022-11-04 (×12): qty 100

## 2022-11-04 MED ORDER — INSULIN GLARGINE-YFGN 100 UNIT/ML ~~LOC~~ SOLN
50.0000 [IU] | Freq: Two times a day (BID) | SUBCUTANEOUS | Status: DC
  Administered 2022-11-05 – 2022-11-06 (×4): 50 [IU] via SUBCUTANEOUS
  Filled 2022-11-04 (×7): qty 0.5

## 2022-11-04 MED ORDER — NOREPINEPHRINE 4 MG/250ML-% IV SOLN
0.0000 ug/min | INTRAVENOUS | Status: DC
  Administered 2022-11-04: 10 ug/min via INTRAVENOUS
  Administered 2022-11-04: 5 ug/min via INTRAVENOUS
  Filled 2022-11-04 (×2): qty 250

## 2022-11-04 MED ORDER — INSULIN ASPART 100 UNIT/ML IJ SOLN
3.0000 [IU] | INTRAMUSCULAR | Status: DC
  Administered 2022-11-04 – 2022-11-05 (×2): 3 [IU] via SUBCUTANEOUS

## 2022-11-04 MED ORDER — INSULIN ASPART 100 UNIT/ML IJ SOLN
0.0000 [IU] | INTRAMUSCULAR | Status: DC
  Administered 2022-11-04 (×2): 4 [IU] via SUBCUTANEOUS
  Administered 2022-11-05: 17 [IU] via SUBCUTANEOUS
  Administered 2022-11-05 (×2): 4 [IU] via SUBCUTANEOUS
  Administered 2022-11-05: 11 [IU] via SUBCUTANEOUS
  Administered 2022-11-06: 3 [IU] via SUBCUTANEOUS
  Administered 2022-11-06: 11 [IU] via SUBCUTANEOUS
  Administered 2022-11-06: 4 [IU] via SUBCUTANEOUS
  Administered 2022-11-06 (×2): 15 [IU] via SUBCUTANEOUS
  Administered 2022-11-06: 7 [IU] via SUBCUTANEOUS
  Administered 2022-11-06: 11 [IU] via SUBCUTANEOUS
  Administered 2022-11-07 (×2): 15 [IU] via SUBCUTANEOUS

## 2022-11-04 MED ORDER — INSULIN GLARGINE-YFGN 100 UNIT/ML ~~LOC~~ SOLN
50.0000 [IU] | Freq: Two times a day (BID) | SUBCUTANEOUS | Status: DC
  Administered 2022-11-04: 50 [IU] via SUBCUTANEOUS
  Filled 2022-11-04 (×3): qty 0.5

## 2022-11-04 MED ORDER — POTASSIUM CHLORIDE 10 MEQ/50ML IV SOLN
10.0000 meq | INTRAVENOUS | Status: AC
  Administered 2022-11-04 (×2): 10 meq via INTRAVENOUS
  Filled 2022-11-04 (×4): qty 50

## 2022-11-04 MED ORDER — POTASSIUM CHLORIDE 20 MEQ PO PACK
40.0000 meq | PACK | Freq: Once | ORAL | Status: AC
  Administered 2022-11-04: 40 meq
  Filled 2022-11-04: qty 2

## 2022-11-04 MED ORDER — SODIUM CHLORIDE 0.9% FLUSH: 10.0000 mL | INTRAVENOUS | Status: DC | PRN

## 2022-11-04 NOTE — Progress Notes (Signed)
[facility] ADULT ICU REPLACEMENT PROTOCOL   The patient does apply for the [facility] Adult ICU Electrolyte Replacment Protocol based on the criteria listed below:   1.Exclusion criteria: TCTS, ECMO, Dialysis, and Myasthenia Gravis patients 2. Is GFR >/= 30 ml/min? Yes.    Patient's GFR today is >60 3. Is SCr </= 2? Yes.   Patient's SCr is 1.13 mg/dL 4. Did SCr increase >/= 0.5 in 24 hours? No. 5.Pt's weight >40kg  Yes.   6. Abnormal electrolyte(s):   K 3.2  7. Electrolytes replaced per protocol 8.  Call MD STAT for K+ </= 2.5, Phos </= 1, or Mag </= 1 Physician:  S. Rosie Fate R Betsi Crespi 11/04/2022 6:23 AM

## 2022-11-04 NOTE — Progress Notes (Addendum)
This RN let MD Byrum know that pt had not had any output  for 12 shift. No new orders given. Foley still in place

## 2022-11-04 NOTE — Progress Notes (Signed)
Pharmacy Antibiotic Note  Lindsey [name] is a 37 y.o. female admitted on 10/31/2022 with  [place] Gangrene .  Pharmacy has been consulted for Zosyn dosing.  SCr 1.13 and trending down. WBC 27.9 stable on steroids. and afebrile. Patient is s/p I&D 1/10 and 1/11 and planning to return to OR 11/13 for further I&D.  Antibiotics de-escalated from Zosyn/Linezolid to Zosyn as would culture from 1/10 is growing group B strep.   Plan: Continue Zosyn 3.375g IV q8h (4 hour infusion). Stop Linezolid Continue to follow OR cultures for further de-escalation Monitor for signs and symptoms of infection  Height: 5\' 6"  (167.6 cm) Weight: (!) 215.4 kg (474 lb 13.9 oz) IBW/kg (Calculated) : 59.3  Temp (24hrs), Avg:99 F (37.2 C), Min:97.9 F (36.6 C), Max:99.6 F (37.6 C)  Recent Labs  Lab 10/30/2022 1811 10/25/2022 1942 11/03/2022 2032 11/02/22 0500 11/02/22 0930 11/02/22 1503 10/27/2022 0000 10/25/2022 0313 11/08/2022 0315 11/17/2022 1734 11/04/22 0505  WBC 22.8*  --   --  29.3*  --   --   --  23.3*  --   --  27.9*  CREATININE 1.27*  --    < > 1.44*  --  1.78* 1.80*  --   --  1.40* 1.13*  LATICACIDVEN 3.5* 2.9*  --   --  1.6  --   --   --  1.0  --   --    < > = values in this interval not displayed.    Estimated Creatinine Clearance: 132.2 mL/min (A) (by C-G formula based on SCr of 1.13 mg/dL (H)).    Allergies  Allergen Reactions   Bupropion Other (See Comments), Palpitations and Shortness Of Breath   Anti-Inflammatory Enzyme [Nutritional Supplements] Swelling    And fluid on the brain   Hydroxyzine Other (See Comments)   Metformin Nausea Only and Other (See Comments)   Nsaids Other (See Comments), Hypertension and Swelling   Rosuvastatin Nausea And Vomiting and Other (See Comments)    Antimicrobials this admission: Cefepime 1/9  Flagyl 1/9  Vancomycin 1/10 Clindamycin 1/10 Linezolid 1/10>1/11 Zosyn 1/10 >>  Dose adjustments this admission:  Microbiology results: 1/9 BCx: no growth x2  days 1/9 UCx: 70k yeast  1/10 Wound Cx: group B strep, mod GNR  1/10 MRSA PCR: neg  Thank you for allowing pharmacy to be a part of this patient's care.  Jeneen Rinks, Pharm.D PGY1 Pharmacy Resident 11/04/2022 10:25 AM

## 2022-11-04 NOTE — Progress Notes (Signed)
Inpatient Diabetes Program Recommendations  AACE/ADA: New Consensus Statement on Inpatient Glycemic Control (2015)  Target Ranges:  Prepandial:   less than 140 mg/dL      Peak postprandial:   less than 180 mg/dL (1-2 hours)      Critically ill patients:  140 - 180 mg/dL   Lab Results  Component Value Date   GLUCAP 150 (H) 11/04/2022   HGBA1C 14.7 (H) 11/21/2022    Review of Glycemic Control  Latest Reference Range & Units 11/04/22 10:26 11/04/22 11:41  Glucose-Capillary 70 - 99 mg/dL 132 (H) 150 (H)   Diabetes history: DM 2 Outpatient Diabetes medications: Actos 45 mg daily, Amaryl 2 mg daily Current orders for Inpatient glycemic control:  IV insulin  Inpatient Diabetes Program Recommendations:    Note IV insulin restarted on 10/29/2022.  Agree with IV insulin at this time for most optimal control in the hospital.    (Once patient meets transition criteria: CBG's <180 mg/dL, Tube feeds at goal rate, and Insulin drip rate <8 units/ hr- Recommend use of ICU glycemic control order set- Phase 3 for transition.)  Thanks,  Adah Perl, RN, BC-ADM Inpatient Diabetes Coordinator Pager [phone]

## 2022-11-04 NOTE — Progress Notes (Signed)
Pt back on levo, MD notified

## 2022-11-04 NOTE — Progress Notes (Signed)
Nutrition Follow-up  DOCUMENTATION CODES:  Morbid obesity  INTERVENTION: Continue at trickle per CCM as blood sugars are elevated. This regimen is insufficient to provide adequate nutrition for wound healing. Vital 1.5 @ 40mL/h (628mL/d) Prosource TF 113mL BID This provides 1220kcal, 121g protein, 445mL free water Pt is meeting ~90% of protein needs but only 45% of overall energy. Goal Regimen: Vital 1.5 @ 26mL/h (1.8L/d) Start at 25 and advance by 73mL q6h to goal of 75 Prosource TF 20 This will provide 2780kcal, 142g of protein, and 1352mL of free water. 1 packet Juven BID, each packet provides 95 calories, 2.5 grams of protein (collagen), and 9.8 grams of carbohydrate (3 grams sugar); also contains 7 grams of L-arginine and L-glutamine, 300 mg vitamin C, 15 mg vitamin E, 1.2 mcg vitamin B-12, 9.5 mg zinc, 200 mg calcium, and 1.5 g  Calcium Beta-hydroxy-Beta-methylbutyrate to support wound healing Vitamin C 250mg   daily, Zinc 220mg  x14 days for wound healing  NUTRITION DIAGNOSIS:  Increased nutrient needs related to wound healing as evidenced by estimated needs. - remains applicable  GOAL:  Provide needs based on ASPEN/SCCM guidelines - progressing, TF to be advanced  MONITOR:  Vent status, Labs, I & O's, Skin, TF tolerance  REASON FOR ASSESSMENT:  Ventilator    ASSESSMENT:  Pt with hx of DM type 2, HTN, HLD and hypothyroidism presented to ED with worsening pain from known boils on her buttocks. Imaging in ED showed large gas containing infection of the right gluteus.  1/10 - Op, I&D, returned to OR on vent 1/11 - Op, I&D     Pt remains intubated on vent support. At the time of visit, TF are turned off. Supposed to be at a trickle. RN reports that bed is broken and there was concern with head of bed not being maintained at the correct angle. States that there is also concern with TF causing elevated glucose levels.  Initially, MD requested a trickle due to not wanting to  place a rectal tube because of the location of pt's wound. However, tube was placed by surgery team preventatively to keep area clean. No stools recorded yet this admission. Pt was on high levels of propofol and 3L of dextrose containing IVF so it was possible to meet nutrition needs between the three sources of kcal and addition of protein  modulars. Propofol has now been weaned off and no longer on dextrose containing IVF.  Discussed increasing rate of TF with CCM now that rectal tube is in place so that adequate nutrition for wound healing can be provided. RPH also trying to adjust insulin and transition off of drip but TF is being held frequently for OR trips and is not being advanced to goal. MD does not want TF about a trickle at this time due to elevated glucose levels.   Will restart feeds at 18mL/h and with added protein from modulars, pt is meeting ~90% of protein needs but only 45% of overall energy. Pt will likely require a shortened infusion time at a higher rate to meet needs due to OR trips (repeat is planned for 1/13 and 1/15). Discussed with MD, ok to change out OGT for post-pyloric cortrak tube for higher feeding rate once feeds are able to be advanced.  MV: 11 L/min Temp (24hrs), Avg:99.2 F (37.3 C), Min:97.9 F (36.6 C), Max:100.4 F (38 C)   Intake/Output Summary (Last 24 hours) at 11/04/2022 1246 Last data filed at 11/04/2022 1000 Gross per 24 hour  Intake 4556.58  ml  Output 975 ml  Net 3581.58 ml  Net IO Since Admission: 16,839.66 mL [11/04/22 1246]  Nutritionally Relevant Medications: Scheduled Meds:  ascorbic acid  250 mg Per Tube Daily   feeding supplement (PROSource TF20)  120 mL Per Tube BID   nutrition supplement (JUVEN)  1 packet Per Tube BID BM   pantoprazole (PROTONIX) IV  40 mg Intravenous Q24H   potassium chloride  20 mEq Per Tube Q4H   zinc sulfate  220 mg Per Tube Daily   Continuous Infusions:  feeding supplement (VITAL 1.5 CAL)     fentaNYL infusion  INTRAVENOUS 400 mcg/hr (11/04/22 1000)   insulin 7.5 Units/hr (11/04/22 1026)   norepinephrine (LEVOPHED) Adult infusion 10 mcg/min (11/04/22 1054)   piperacillin-tazobactam (ZOSYN)  IV 12.5 mL/hr at 11/04/22 1000   potassium chloride     Labs Reviewed: Na 132, chloride 97 K 3.2 BUN 45, creatinine 1.13 Phosphorus 2.4 Mg 2.6 CBG ranges from 132-250 mg/dL over the last 24 hours  HgbA1c 14.7% (1/9)  NUTRITION - FOCUSED PHYSICAL EXAM: Flowsheet Row Most Recent Value  Orbital Region No depletion  Upper Arm Region No depletion  Thoracic and Lumbar Region No depletion  Buccal Region No depletion  Temple Region No depletion  Clavicle Bone Region No depletion  Clavicle and Acromion Bone Region No depletion  Scapular Bone Region No depletion  Dorsal Hand No depletion  Patellar Region No depletion  Anterior Thigh Region No depletion  Posterior Calf Region No depletion  Edema (RD Assessment) Moderate  [generalized]  Hair Reviewed  Eyes Reviewed  Mouth Reviewed  Skin Reviewed  Nails Reviewed   Diet Order:   Diet Order             Diet NPO time specified  Diet effective now                   EDUCATION NEEDS:  Not appropriate for education at this time  Skin:  Skin Assessment: Reviewed RN Assessment  Last BM:  prior to admission  Height:  Ht Readings from Last 1 Encounters:  11/22/2022 5\' 6"  (1.676 m)    Weight:  Wt Readings from Last 1 Encounters:  11/04/22 (!) 215.4 kg    Ideal Body Weight:  59.1 kg  BMI:  Body mass index is 76.65 kg/m.  Estimated Nutritional Needs:  Kcal:  2700-3000 kcal/d Protein:  135-150 g/d Fluid:  2.2-2.5L/d    Lindsey [name], RD, LDN Clinical Dietitian RD pager # available in [facility]  After hours/weekend pager # available in [facility]

## 2022-11-04 NOTE — Progress Notes (Signed)
[facility] Progress Note Patient Name: Lindsey [name] DOB: 1986-03-07 MRN: [id number]   Date of Service  11/04/2022  HPI/Events of Note  Nursing concern for anxiety  hypertension - BP = 176/91 with MAP = 85 by A-line. Patient is currently on a Fentanyl IV infusion at 200 mcg/hour and Versed 2-4 mg Q 1 hour PRN.  eICU Interventions  Plan: Increase Hydralazine dose frequency to Q 4 hours. Increase ceiling on Fentanyl IV infusion to 400 mcg/hour. Titrate to RASS = 0 to -1.     Intervention Category Major Interventions: Hypertension - evaluation and management;Delirium, psychosis, severe agitation - evaluation and management  [name] 11/04/2022, 1:29 AM

## 2022-11-04 NOTE — Progress Notes (Signed)
Peripherally Inserted Central Catheter Placement  The IV Nurse has discussed with the patient and/or persons authorized to consent for the patient, the purpose of this procedure and the potential benefits and risks involved with this procedure.  The benefits include less needle sticks, lab draws from the catheter, and the patient may be discharged home with the catheter. Risks include, but not limited to, infection, bleeding, blood clot (thrombus formation), and puncture of an artery; nerve damage and irregular heartbeat and possibility to perform a PICC exchange if needed/ordered by physician.  Alternatives to this procedure were also discussed.  Bard Power PICC patient education guide, fact sheet on infection prevention and patient information card has been provided to patient /or left at bedside.    PICC Placement Documentation  PICC Triple Lumen 11/57/26 Left Cephalic 55 cm 0 cm (Active)  Indication for Insertion or Continuance of Line Vasoactive infusions 11/04/22 1517  Exposed Catheter (cm) 2 cm 11/04/22 1517  Site Assessment Clean, Dry, Intact 11/04/22 1517  Lumen #1 Status Flushed;Saline locked;Blood return noted 11/04/22 1517  Lumen #2 Status Flushed;Saline locked;Blood return noted 11/04/22 1517  Lumen #3 Status Blood return noted;Flushed;Saline locked 11/04/22 1517  Dressing Type Transparent;Securing device 11/04/22 1517  Dressing Status Antimicrobial disc in place 11/04/22 [place] Not Applicable 20/35/59 7416  Dressing Change Due 11/11/22 11/04/22 [place] 11/04/2022, 3:21 PM

## 2022-11-04 NOTE — Progress Notes (Signed)
Meds are backed up due to limited access and PICC placement. MD/ Rx aware of issue

## 2022-11-04 NOTE — Progress Notes (Signed)
1 Day Post-Op   Subjective/Chief Complaint: Intubated sedated   Objective: Vital signs in last 24 hours: Temp:  [97.9 F (36.6 C)-99.6 F (37.6 C)] 99.6 F (37.6 C) (01/12 0740) Pulse Rate:  [69-99] 88 (01/12 0800) Resp:  [0-41] 0 (01/12 0800) BP: (99-172)/(32-97) 110/43 (01/12 0800) SpO2:  [90 %-100 %] 98 % (01/12 0800) Arterial Line BP: (129-206)/(38-79) 148/52 (01/12 0800) FiO2 (%):  [30 %-40 %] 40 % (01/12 0348) Weight:  [215.4 kg] 215.4 kg (01/12 0408) Last BM Date :  (PTA)  Intake/Output from previous day: Nov 25, 2022 0701 - 01/12 0700 In: 4964.1 [I.V.:3455.1; NG/GT:480; IV Piggyback:1029] Out: 3536 [Urine:1075; Blood:200] Intake/Output this shift: Total I/O In: 263.8 [I.V.:221.4; NG/GT:20; IV Piggyback:22.4] Out: -   Dressing intact, unable to completely visualize  Lab Results:  Recent Labs    11/30/2022 0313 November 25, 2022 1014 November 25, 2022 1734 11/04/22 0505  WBC 23.3*  --   --  27.9*  HGB 8.7*   < > 8.2* 7.6*  HCT 25.6*   < > 23.5* 22.7*  PLT 200  --   --  217   < > = values in this interval not displayed.   BMET Recent Labs    12/06/2022 1734 11/04/22 0505  NA 128* 132*  K 3.0* 3.2*  CL 95* 97*  CO2 21* 24  GLUCOSE 276* 148*  BUN 46* 45*  CREATININE 1.40* 1.13*  CALCIUM 6.8* 6.5*   PT/INR No results for input(s): "LABPROT", "INR" in the last 72 hours. ABG Recent Labs    12/13/2022 1014 12/18/2022 1247  PHART 7.327* 7.343*  HCO3 23.0 23.8    Studies/Results: DG Abd 1 View  Result Date: 11/02/2022 CLINICAL DATA:  Orogastric tube placement EXAM: ABDOMEN - 1 VIEW COMPARISON:  CT abdomen 11/10/2022 FINDINGS: The orogastric tube side port and distal tip are in the stomach body. Mild cardiomegaly noted. IMPRESSION: 1. Orogastric tube side port and distal tip are in the stomach body. Electronically Signed   By: Van Clines M.D.   On: 11/02/2022 14:45    Anti-infectives: Anti-infectives (From admission, onward)    Start     Dose/Rate Route Frequency  Ordered Stop   November 25, 2022 1600  piperacillin-tazobactam (ZOSYN) IVPB 3.375 g        3.375 g 12.5 mL/hr over 240 Minutes Intravenous Every 8 hours 12/05/2022 1459     11/02/22 1400  linezolid (ZYVOX) IVPB 600 mg        600 mg 300 mL/hr over 60 Minutes Intravenous Every 12 hours 11/02/22 1222     11/02/22 1000  vancomycin (VANCOREADY) IVPB 1250 mg/250 mL  Status:  Discontinued        1,250 mg 166.7 mL/hr over 90 Minutes Intravenous Every 12 hours 11/17/2022 2135 10/24/2022 2137   11/02/22 1000  vancomycin (VANCOREADY) IVPB 1500 mg/300 mL  Status:  Discontinued        1,500 mg 150 mL/hr over 120 Minutes Intravenous Every 12 hours 11/23/2022 2137 11/02/22 1222   11/02/22 0300  ceFEPIme (MAXIPIME) 2 g in sodium chloride 0.9 % 100 mL IVPB  Status:  Discontinued        2 g 200 mL/hr over 30 Minutes Intravenous Every 8 hours 11/04/2022 2135 10/31/2022 2258   11/02/22 0045  vancomycin (VANCOREADY) IVPB 2000 mg/400 mL        2,000 mg 200 mL/hr over 120 Minutes Intravenous  Once 11/02/22 0030 11/02/22 0305   10/25/2022 2315  piperacillin-tazobactam (ZOSYN) IVPB 3.375 g  Status:  Discontinued  3.375 g 12.5 mL/hr over 240 Minutes Intravenous Every 8 hours 11/11/22 2306 11/11/2022 1459   2022/11/11 2315  clindamycin (CLEOCIN) IVPB 900 mg  Status:  Discontinued        900 mg 100 mL/hr over 30 Minutes Intravenous Every 8 hours 2022-11-11 2306 11/02/22 1222   11/11/22 1930  vancomycin (VANCOCIN) IVPB 1000 mg/200 mL premix  Status:  Discontinued       See Hyperspace for full Linked Orders Report.   1,000 mg 200 mL/hr over 60 Minutes Intravenous  Once 2022/11/11 1823 11/02/22 0029   2022-11-11 1830  ceFEPIme (MAXIPIME) 2 g in sodium chloride 0.9 % 100 mL IVPB        2 g 200 mL/hr over 30 Minutes Intravenous  Once 11/11/22 1819 2022/11/11 2016   11-11-2022 1830  metroNIDAZOLE (FLAGYL) IVPB 500 mg        500 mg 100 mL/hr over 60 Minutes Intravenous  Once November 11, 2022 1819 11/11/22 2130   11-11-22 1830  vancomycin (VANCOCIN) IVPB  1000 mg/200 mL premix  Status:  Discontinued        1,000 mg 200 mL/hr over 60 Minutes Intravenous  Once 2022/11/11 1819 Nov 11, 2022 1823   11/11/2022 1830  vancomycin (VANCOCIN) IVPB 1000 mg/200 mL premix  Status:  Discontinued       See Hyperspace for full Linked Orders Report.   1,000 mg 200 mL/hr over 60 Minutes Intravenous  Once 11/11/22 1823 11/02/22 0029       Assessment/Plan: POD 2/1 s/p debridement of buttock NSTI Dr. Rosendo Gros initially -leave packing in place today -OR tomorrow and Monday at minimum, will need to be prone and in lithotomy in OR (used two OR beds) -cont abx therapy, prelim cx  MODERATE GRAM POSITIVE COCCI IN PAIRS AND CHAINS  MODERATE GRAM NEGATIVE RODS - GBS and strep mitis/oralis   FEN - NPO on vent, tube feeds VTE - heparin ID - Clindamycin, Zosyn, Vanc   Septic shock - secondary to above.  Appreciate CCM's care of this patient as she is critically ill ARF - cr 1.13 improving DM - on insulin gtt  Morbid obesity - BMI 72 Hypothryoidism PCOS HLD Anxiety/Depression  Lindsey [name] 11/04/2022

## 2022-11-04 NOTE — Progress Notes (Signed)
NAME:  Lindsey [name], MRN:  [id number], DOB:  03/21/1986, LOS: 3 ADMISSION DATE:  11/04/2022, CONSULTATION DATE:  11/09/2022 REFERRING MD:  Rubin Payor CHIEF COMPLAINT:  Fatigue, buttock wounds   History of Present Illness:  Lindsey [name] is a 37 y.o. female who has a PMH as below including DM2. She presented to [facility] 1/9 with fatigue, dyspnea, buttock wounds that had been getting worse. First noticed buttock wound around thanksgiving but it healed on its own. Then recurred around new years and has persisted and worsened to the point of malodorous drainage. She has also had fatigue and dyspnea. She apparently had been eating badly around the holidays and her CBG's had been reading high. Normally she states that CBG's are well controlled.  In ED, she was found to have HHS. 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  While at [facility], she also developed A.Fib RVR for which she received Amiodarone bolus x 2 followed by infusion. She was given one dose of Flagyl and was transferred to [facility] ED for surgical evaluation. En route to [facility], she converted to NSR. BP has been stable since ED presentation.  She was evaluated by surgery and is being taken to the OR for debridement tonight. She is currently on Insulin infusion, Amiodarone infusion, potassium supplementation. Her mother is with her at the bedside.  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  Significant Hospital Events: Including procedures, antibiotic start and stop dates in addition to other pertinent events   CT abdomen pelvis 1/9 > basilar atelectasis, normal stomach, appendix, bowel wall, no hernia, mild to moderate subcutaneous impaired muscular inflammatory fat stranding anterolateral aspect abdominal wall on the left, no ascites.  Soft tissue gas along the perineum on the right with medial and posterior medial right gluteal involvement consistent with Fournier's gangrene OR for debridement 1/9 Blood cultures 1/9 >  Urine culture 1/9 > yeast 70 K Right buttock wound culture 1/10 > GPC, GNR >   Interim History / Subjective:  Went back to the OR on 1/11 for successful extensive debridement Rectal tube placed Fentanyl ceiling increased to 400, currently on 400 Receiving frequent Versed pushes Remains hypokalemic Remains on bicarb drip I/O positive 16.4 L total  Objective:  Blood pressure (!) 110/43, pulse 88, temperature 99.6 F (37.6 C), temperature source Axillary, resp. rate (!) 0, height 5\' 6"  (1.676 m), weight (!) 215.4 kg, SpO2 98 %. CVP:  [15 mmHg-20 mmHg] 16 mmHg  Vent Mode: PRVC FiO2 (%):  [30 %-40 %] 40 % Set Rate:  [0.5 bmp-35 bmp] 0.5 bmp Vt Set:  [480 mL] 480 mL PEEP:  [10 cmH20] 10 cmH20 Plateau Pressure:  [27 cmH20-31 cmH20] 28 cmH20   Intake/Output Summary (Last 24 hours) at 11/04/2022 0818 Last data filed at 11/04/2022 0800 Gross per 24 hour  Intake 5051.25 ml  Output 1275 ml  Net 3776.25 ml   Filed Weights   10/24/2022 1734 11/23/2022 1858 11/04/22 0408  Weight: (!) 204.1 kg (!) 201.9 kg (!) 215.4 kg  Examination: General: Obese ill-appearing woman, no distress Neuro: Awake, nods to questions, interacts, moves all extremities HEENT: ET tube in place, oropharynx clear Cardiovascular: Distant, regular, no murmur Lungs: Distant, clear breath  sounds Abdomen: Morbidly obese, hypoactive bowel sounds Musculoskeletal: No deformity, no edema Skin: No rash   Assessment & Plan:   HHS - presumed 2/2 Fournier's Gangrene. Hx DM 2. -Required restart insulin infusion 1/11 for persistent hyperglycemia.  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  Wound culture with group B strep, nontoxin producing, strep mitis -Appreciate surgery debridement and management.  She will go back to the operating room on 1/13, likely also on 1/15 -Continue Zosyn, okay to stop linezolid on 1/12 stone culture data  Septic shock due to Fournier's gangrene -Continue supportive care, she is adequately volume resuscitated, now likely hypervolemic.  Will try to decrease IVF as able -Antibiotics as above -Surgical care as above  Acute metabolic encephalopathy due to acute infection with septic shock, HHS -Supportive care -Fentanyl requirement has increased, mental status actually good. -Plan to add Precedex 1/12 -Okay to continue Versed pushes if needed  A.fib with RVR - 2/2 above, converted to NSR after Amiodarone. Hx HTN, HLD. -Discontinued amiodarone infusion on 1/11 -Continue to hold pravastatin, propranolol, irbesartan are all on hold  Pseudohyponatremia - 2/2 Hyperglycemia. Hypokalemia - being repleted. AKI. NAGMA, multifactorial but principally due to HHS.  Lactic acid is cleared Hypocalcemia. -Follow ABG and attempt to come off bicarbonate if possible -Follow BMP, urine output -Supplement electrolytes as indicated  Hx Hypothyroidism. -Continue home levothyroxine, increased to 75 mcg daily per her home dose  Hx Anxiety, Depression, Panic disorder, PTSD. -Appreciate pharmacy assistance, clarified home regimen.   She is off most of her meds, only on quetiapine at night.  Holding for now  Idiopathic intracranial hypertension -Has been managed with Diamox as an outpatient, currently on hold  Best practice (evaluated daily):  Diet/type: NPO DVT prophylaxis: prophylactic heparin  GI prophylaxis: PPI Lines: Central line and Arterial Line Foley:  Yes, and it is still needed Code Status:  full code Last date of multidisciplinary goals of care discussion: Pending Family: Updated mother at bedside 1/11  Labs   CBC: Recent Labs  Lab Nov 12, 2022 1811 11/12/2022 2024 11/02/22 0500 11/02/22 0801 11/12/2022 0313 10/26/2022 1014 11/19/2022 1247 10/29/2022 1734 11/04/22 0505  WBC 22.8*  --  29.3*  --  23.3*  --   --   --  27.9*  NEUTROABS 18.4*  --   --   --   --   --   --   --   --   HGB 11.3*   < > 10.2*   < > 8.7* 9.2* 9.2* 8.2* 7.6*  HCT 34.0*   < > 30.7*   < > 25.6* 27.0* 27.0* 23.5* 22.7*  MCV 83.1  --  84.1  --  83.4  --   --   --  83.8  PLT 240  --  265  --  200  --   --   --  217   < > = values in this interval not displayed.    Basic Metabolic Panel: Recent Labs  Lab 11/02/22 0500 11/02/22 0801 11/02/22 1503 11/02/2022 0000 11/02/2022 0313 10/25/2022 1014 11/04/2022 1247 11/12/2022 1734 11/04/22 0505  NA 128*   < >  130* 127*  --  130* 130* 128* 132*  K 3.4*   < > 3.4* 3.4*  --  3.3* 3.4* 3.0* 3.2*  CL 96*  --  99 95*  --   --   --  95* 97*  CO2 20*  --  21* 20*  --   --   --  21* 24  GLUCOSE 262*  --  192* 261*  --   --   --  276* 148*  BUN 29*  --  30* 35*  --   --   --  46* 45*  CREATININE 1.44*  --  1.78* 1.80*  --   --   --  1.40* 1.13*  CALCIUM 7.3*  --  6.8* 6.8*  --   --   --  6.8* 6.5*  MG  --   --   --   --  2.7*  --   --   --  2.6*  PHOS  --   --   --   --  3.9  --   --   --  2.4*   < > = values in this interval not displayed.   GFR: Estimated Creatinine Clearance: 132.2 mL/min (A) (by C-G formula based on SCr of 1.13 mg/dL (H)). Recent Labs  Lab 11/15/2022 1811 11/20/2022 1942  11/02/22 0500 11/02/22 0930 11/19/2022 0313 11/09/2022 0315 11/04/22 0505  WBC 22.8*  --  29.3*  --  23.3*  --  27.9*  LATICACIDVEN 3.5* 2.9*  --  1.6  --  1.0  --     Liver Function Tests: Recent Labs  Lab 11/13/2022 0000  AST 29  ALT 26  ALKPHOS 85  BILITOT 0.9  PROT 5.7*  ALBUMIN 1.5*   No results for input(s): "LIPASE", "AMYLASE" in the last 168 hours. No results for input(s): "AMMONIA" in the last 168 hours.  ABG    Component Value Date/Time   PHART 7.343 (L) 10/27/2022 1247   PCO2ART 43.6 11/17/2022 1247   PO2ART 87 11/13/2022 1247   HCO3 23.8 11/16/2022 1247   TCO2 25 10/24/2022 1247   ACIDBASEDEF 2.0 11/22/2022 1247   O2SAT 96 11/16/2022 1247     Coagulation Profile: No results for input(s): "INR", "PROTIME" in the last 168 hours.  Cardiac Enzymes: No results for input(s): "CKTOTAL", "CKMB", "CKMBINDEX", "TROPONINI" in the last 168 hours.  HbA1C: Hgb A1c MFr Bld  Date/Time Value Ref Range Status  11/10/2022 10:42 PM 14.7 (H) 4.8 - 5.6 % Final    Comment:    (NOTE) Pre diabetes:          5.7%-6.4%  Diabetes:              >6.4%  Glycemic control for   <7.0% adults with diabetes     CBG: Recent Labs  Lab 11/04/22 0317 11/04/22 0429 11/04/22 0531 11/04/22 0627 11/04/22 0735  GLUCAP 183* 154* 160* 150* 157*     Critical care time: 34 min.   Baltazar Apo, MD, PhD 11/04/2022, 8:18 AM [facility] [phone] or if no answer before 7:00PM call [phone] For any issues after 7:00PM please call eLink [phone]

## 2022-11-04 NOTE — Progress Notes (Signed)
This RN irrigated patients foley with 30cc at 0910. Foley is now patent and draining. Notified Elink RN that foley is now patent.

## 2022-11-05 ENCOUNTER — Inpatient Hospital Stay (HOSPITAL_COMMUNITY): Payer: Medicaid Other | Admitting: Certified Registered Nurse Anesthetist

## 2022-11-05 ENCOUNTER — Encounter (HOSPITAL_COMMUNITY): Admission: EM | Disposition: E | Payer: Self-pay | Source: Home / Self Care | Attending: Internal Medicine

## 2022-11-05 ENCOUNTER — Other Ambulatory Visit: Payer: Self-pay

## 2022-11-05 ENCOUNTER — Encounter (HOSPITAL_COMMUNITY): Payer: Self-pay | Admitting: Pulmonary Disease

## 2022-11-05 DIAGNOSIS — E039 Hypothyroidism, unspecified: Secondary | ICD-10-CM

## 2022-11-05 DIAGNOSIS — M726 Necrotizing fasciitis: Secondary | ICD-10-CM

## 2022-11-05 DIAGNOSIS — I10 Essential (primary) hypertension: Secondary | ICD-10-CM

## 2022-11-05 DIAGNOSIS — D638 Anemia in other chronic diseases classified elsewhere: Secondary | ICD-10-CM

## 2022-11-05 DIAGNOSIS — G9340 Encephalopathy, unspecified: Secondary | ICD-10-CM

## 2022-11-05 DIAGNOSIS — Z87891 Personal history of nicotine dependence: Secondary | ICD-10-CM

## 2022-11-05 DIAGNOSIS — R6521 Severe sepsis with septic shock: Secondary | ICD-10-CM

## 2022-11-05 DIAGNOSIS — A419 Sepsis, unspecified organism: Secondary | ICD-10-CM

## 2022-11-05 HISTORY — PX: INCISION AND DRAINAGE PERIRECTAL ABSCESS: SHX1804

## 2022-11-05 LAB — BASIC METABOLIC PANEL
Anion gap: 12 (ref 5–15)
Anion gap: 10 (ref 5–15)
BUN: 51 mg/dL — ABNORMAL HIGH (ref 6–20)
BUN: 61 mg/dL — ABNORMAL HIGH (ref 6–20)
CO2: 23 mmol/L (ref 22–32)
CO2: 23 mmol/L (ref 22–32)
Calcium: 7 mg/dL — ABNORMAL LOW (ref 8.9–10.3)
Calcium: 7.2 mg/dL — ABNORMAL LOW (ref 8.9–10.3)
Chloride: 99 mmol/L (ref 98–111)
Chloride: 101 mmol/L (ref 98–111)
Creatinine, Ser: 1.41 mg/dL — ABNORMAL HIGH (ref 0.44–1.00)
Creatinine, Ser: 1.32 mg/dL — ABNORMAL HIGH (ref 0.44–1.00)
GFR, Estimated: 50 mL/min — ABNORMAL LOW (ref >60)
GFR, Estimated: 54 mL/min — ABNORMAL LOW (ref >60)
Glucose, Bld: 207 mg/dL — ABNORMAL HIGH (ref 70–99)
Glucose, Bld: 240 mg/dL — ABNORMAL HIGH (ref 70–99)
Potassium: 4.3 mmol/L (ref 3.5–5.1)
Potassium: 4.5 mmol/L (ref 3.5–5.1)
Sodium: 134 mmol/L — ABNORMAL LOW (ref 135–145)
Sodium: 134 mmol/L — ABNORMAL LOW (ref 135–145)

## 2022-11-05 LAB — MAGNESIUM
Magnesium: 2.9 mg/dL — ABNORMAL HIGH (ref 1.7–2.4)
Magnesium: 3 mg/dL — ABNORMAL HIGH (ref 1.7–2.4)

## 2022-11-05 LAB — POCT I-STAT 7, (LYTES, BLD GAS, ICA,H+H)
Acid-base deficit: 1 mmol/L (ref 0.0–2.0)
Bicarbonate: 24.7 mmol/L (ref 20.0–28.0)
Calcium, Ion: 0.99 mmol/L — ABNORMAL LOW (ref 1.15–1.40)
HCT: 20 % — ABNORMAL LOW (ref 36.0–46.0)
Hemoglobin: 6.8 g/dL — CL (ref 12.0–15.0)
O2 Saturation: 99 %
Patient temperature: 98.8
Potassium: 5 mmol/L (ref 3.5–5.1)
Sodium: 137 mmol/L (ref 135–145)
TCO2: 26 mmol/L (ref 22–32)
pCO2 arterial: 44.4 mmHg (ref 32–48)
pH, Arterial: 7.354 (ref 7.35–7.45)
pO2, Arterial: 121 mmHg — ABNORMAL HIGH (ref 83–108)

## 2022-11-05 LAB — GLUCOSE, CAPILLARY
Glucose-Capillary: 177 mg/dL — ABNORMAL HIGH (ref 70–99)
Glucose-Capillary: 198 mg/dL — ABNORMAL HIGH (ref 70–99)
Glucose-Capillary: 202 mg/dL — ABNORMAL HIGH (ref 70–99)
Glucose-Capillary: 238 mg/dL — ABNORMAL HIGH (ref 70–99)
Glucose-Capillary: 252 mg/dL — ABNORMAL HIGH (ref 70–99)
Glucose-Capillary: 276 mg/dL — ABNORMAL HIGH (ref 70–99)

## 2022-11-05 LAB — CBC
HCT: 18.4 % — ABNORMAL LOW (ref 36.0–46.0)
Hemoglobin: 6.2 g/dL — CL (ref 12.0–15.0)
MCH: 28.8 pg (ref 26.0–34.0)
MCHC: 33.7 g/dL (ref 30.0–36.0)
MCV: 85.6 fL (ref 80.0–100.0)
Platelets: 166 10*3/uL (ref 150–400)
RBC: 2.15 MIL/uL — ABNORMAL LOW (ref 3.87–5.11)
RDW: 16.4 % — ABNORMAL HIGH (ref 11.5–15.5)
WBC: 25.2 10*3/uL — ABNORMAL HIGH (ref 4.0–10.5)
nRBC: 0.4 % — ABNORMAL HIGH (ref 0.0–0.2)

## 2022-11-05 LAB — PREPARE RBC (CROSSMATCH)

## 2022-11-05 SURGERY — INCISION AND DRAINAGE, ABSCESS, PERIRECTAL
Anesthesia: General

## 2022-11-05 MED ORDER — INSULIN ASPART 100 UNIT/ML IJ SOLN
6.0000 [IU] | INTRAMUSCULAR | Status: DC
  Administered 2022-11-05 – 2022-11-06 (×5): 6 [IU] via SUBCUTANEOUS

## 2022-11-05 MED ORDER — PROPOFOL 10 MG/ML IV BOLUS
INTRAVENOUS | Status: AC
  Filled 2022-11-05: qty 20

## 2022-11-05 MED ORDER — ALTEPLASE 2 MG IJ SOLR
2.0000 mg | Freq: Once | INTRAMUSCULAR | Status: DC
  Filled 2022-11-05 (×2): qty 2
  Filled 2022-11-05: qty 4

## 2022-11-05 MED ORDER — ATROPINE SULFATE 1 MG/10ML IJ SOSY
0.5000 mg | PREFILLED_SYRINGE | INTRAMUSCULAR | Status: AC
  Administered 2022-11-06: 0.5 mg via INTRAVENOUS
  Filled 2022-11-05: qty 10

## 2022-11-05 MED ORDER — ATROPINE SULFATE 1 MG/ML IV SOLN: 1.0000 mg | Freq: Once | INTRAVENOUS | Status: AC

## 2022-11-05 MED ORDER — ATROPINE SULFATE 1 MG/10ML IJ SOSY
PREFILLED_SYRINGE | INTRAMUSCULAR | Status: AC
  Administered 2022-11-05: 1 mg
  Filled 2022-11-05: qty 10

## 2022-11-05 MED ORDER — SODIUM CHLORIDE 0.9 % IR SOLN
Status: DC | PRN
  Administered 2022-11-05: 3000 mL

## 2022-11-05 MED ORDER — SENNOSIDES-DOCUSATE SODIUM 8.6-50 MG PO TABS
1.0000 | ORAL_TABLET | Freq: Two times a day (BID) | ORAL | Status: DC
  Administered 2022-11-05 – 2022-11-10 (×10): 1
  Filled 2022-11-05 (×10): qty 1

## 2022-11-05 MED ORDER — POLYETHYLENE GLYCOL 3350 17 G PO PACK
17.0000 g | PACK | Freq: Every day | ORAL | Status: DC
  Administered 2022-11-05 – 2022-11-06 (×2): 17 g
  Filled 2022-11-05 (×2): qty 1

## 2022-11-05 MED ORDER — MIDAZOLAM HCL 2 MG/2ML IJ SOLN
INTRAMUSCULAR | Status: AC
  Filled 2022-11-05: qty 2

## 2022-11-05 MED ORDER — ROCURONIUM BROMIDE 10 MG/ML (PF) SYRINGE
PREFILLED_SYRINGE | INTRAVENOUS | Status: DC | PRN
  Administered 2022-11-05 (×3): 50 mg via INTRAVENOUS

## 2022-11-05 MED ORDER — ACETAMINOPHEN 325 MG PO TABS
650.0000 mg | ORAL_TABLET | Freq: Four times a day (QID) | ORAL | Status: DC | PRN
  Administered 2022-11-05 – 2022-11-09 (×3): 650 mg
  Filled 2022-11-05 (×2): qty 2

## 2022-11-05 MED ORDER — 0.9 % SODIUM CHLORIDE (POUR BTL) OPTIME
TOPICAL | Status: DC | PRN
  Administered 2022-11-05: 1000 mL

## 2022-11-05 MED ORDER — SODIUM CHLORIDE 0.9 % IV SOLN: INTRAVENOUS | Status: DC | PRN

## 2022-11-05 MED ORDER — ATROPINE SULFATE 1 MG/10ML IJ SOSY
PREFILLED_SYRINGE | INTRAMUSCULAR | Status: AC
  Filled 2022-11-05: qty 10

## 2022-11-05 MED ORDER — SODIUM CHLORIDE 0.9% IV SOLUTION: Freq: Once | INTRAVENOUS | Status: DC

## 2022-11-05 MED ORDER — SODIUM BICARBONATE 8.4 % IV SOLN
INTRAVENOUS | Status: AC
  Administered 2022-11-05: 50 meq
  Filled 2022-11-05: qty 100

## 2022-11-05 MED ORDER — MIDAZOLAM HCL 2 MG/2ML IJ SOLN
INTRAMUSCULAR | Status: DC | PRN
  Administered 2022-11-05: 2 mg via INTRAMUSCULAR

## 2022-11-05 MED ORDER — SODIUM CHLORIDE 0.9 % IV SOLN: 10.0000 mg | Freq: Once | INTRAVENOUS | Status: DC

## 2022-11-05 MED ORDER — INSULIN GLARGINE-YFGN 100 UNIT/ML ~~LOC~~ SOLN
25.0000 [IU] | Freq: Once | SUBCUTANEOUS | Status: AC
  Administered 2022-11-05: 25 [IU] via SUBCUTANEOUS
  Filled 2022-11-05: qty 0.25

## 2022-11-05 SURGICAL SUPPLY — 39 items
BAG COUNTER SPONGE SURGICOUNT (BAG) ×2 IMPLANT
BAG SPNG CNTER NS LX DISP (BAG) ×1
BNDG GAUZE DERMACEA FLUFF 4 (GAUZE/BANDAGES/DRESSINGS) IMPLANT
BNDG GZE DERMACEA 4 6PLY (GAUZE/BANDAGES/DRESSINGS) ×4
CANISTER SUCT 3000ML PPV (MISCELLANEOUS) ×2 IMPLANT
COVER SURGICAL LIGHT HANDLE (MISCELLANEOUS) ×2 IMPLANT
DRAPE UTILITY XL STRL (DRAPES) ×2 IMPLANT
ELECT REM PT RETURN 9FT ADLT (ELECTROSURGICAL) ×1
ELECTRODE REM PT RTRN 9FT ADLT (ELECTROSURGICAL) ×2 IMPLANT
GAUZE PAD ABD 8X10 STRL (GAUZE/BANDAGES/DRESSINGS) ×2 IMPLANT
GAUZE SPONGE 4X4 12PLY STRL (GAUZE/BANDAGES/DRESSINGS) ×2 IMPLANT
GLOVE BIO SURGEON STRL SZ7.5 (GLOVE) ×2 IMPLANT
GLOVE BIOGEL PI IND STRL 8 (GLOVE) ×2 IMPLANT
GLOVE SURG SYN 7.5  E (GLOVE) ×1
GLOVE SURG SYN 7.5 E (GLOVE) ×1 IMPLANT
GLOVE SURG SYN 7.5 PF PI (GLOVE) ×2 IMPLANT
GOWN STRL REUS W/ TWL LRG LVL3 (GOWN DISPOSABLE) ×2 IMPLANT
GOWN STRL REUS W/ TWL XL LVL3 (GOWN DISPOSABLE) ×2 IMPLANT
GOWN STRL REUS W/TWL LRG LVL3 (GOWN DISPOSABLE) ×1
GOWN STRL REUS W/TWL XL LVL3 (GOWN DISPOSABLE) ×2
HANDPIECE INTERPULSE COAX TIP (DISPOSABLE) ×1
KIT BASIN OR (CUSTOM PROCEDURE TRAY) ×2 IMPLANT
KIT TURNOVER KIT B (KITS) ×2 IMPLANT
NS IRRIG 1000ML POUR BTL (IV SOLUTION) ×2 IMPLANT
PACK LITHOTOMY IV (CUSTOM PROCEDURE TRAY) ×2 IMPLANT
PAD ARMBOARD 7.5X6 YLW CONV (MISCELLANEOUS) ×2 IMPLANT
PENCIL SMOKE EVACUATOR (MISCELLANEOUS) ×2 IMPLANT
SET HNDPC FAN SPRY TIP SCT (DISPOSABLE) IMPLANT
SPONGE T-LAP 18X18 ~~LOC~~+RFID (SPONGE) ×2 IMPLANT
SUT SILK 0 SH 30 (SUTURE) IMPLANT
SUT SILK 2 0 SH (SUTURE) IMPLANT
SUT SILK 3 0 SH 30 (SUTURE) IMPLANT
SWAB COLLECTION DEVICE MRSA (MISCELLANEOUS) IMPLANT
SWAB CULTURE ESWAB REG 1ML (MISCELLANEOUS) IMPLANT
TOWEL GREEN STERILE (TOWEL DISPOSABLE) ×2 IMPLANT
TOWEL GREEN STERILE FF (TOWEL DISPOSABLE) ×2 IMPLANT
TUBE CONNECTING 12X1/4 (SUCTIONS) ×2 IMPLANT
UNDERPAD 30X36 HEAVY ABSORB (UNDERPADS AND DIAPERS) ×2 IMPLANT
YANKAUER SUCT BULB TIP NO VENT (SUCTIONS) ×2 IMPLANT

## 2022-11-05 NOTE — Progress Notes (Signed)
Pharmacy ICU Insulin Management  CBGs above goal 140-180 mg/dL: Yes  Current regimen (include units of SSI in last 24hr): - Semglee 50u BID - Novolog 3u Q4H  - Resistant SSI  Medications affecting CBG levels: TF - TF held this morning for return to OR for I&D - Received 25u Semglee @0200  d/t NPO  Plan: - Continue Semglee 50u BID - Increase Novolog TF coverage to 6u Q4H - Continue resistant SSI  Erskine Speed, PharmD Clinical Pharmacist

## 2022-11-05 NOTE — Op Note (Signed)
11-16-2022  9:19 AM  PATIENT:  Lindsey [name]  37 y.o. female  PRE-OPERATIVE DIAGNOSIS:  Necrotizing fasciitis  POST-OPERATIVE DIAGNOSIS:  Necrotizing fasciitis  PROCEDURE:  Procedure(s): EXCISIONAL DEBRIDEMENT PERINEAL WOUND (N/A)  SURGEON:  Surgeon(s) and Role:    Ralene Ok, MD - Primary  ASSISTANTS: none   ANESTHESIA:   local and general  EBL:  minimal   BLOOD ADMINISTERED:none  DRAINS: 3 Betadine soaked Kerlix's, 1 anteriorly and 2 to tied posteriorly  LOCAL MEDICATIONS USED:  NONE  SPECIMEN:  No Specimen  DISPOSITION OF SPECIMEN:  N/A  COUNTS:  YES  TOURNIQUET:  * No tourniquets in log *  DICTATION: .Dragon Dictation   Indication procedure: Patient is a 37 year old female, comes into the hospital secondary.  Necrotizing soft tissue infection.  Patient had 2 previous debridements and was taken back for repeat debridement and inspection.  Findings: Patient had approximately an area anteriorly of 5 x 4 cm removed from the anterior portion of her lower pubic area.  Several fascia areas of necrosis were removed as they were ischemic.  Posteriorly there was an area in the superior aspect of the wound that was removed and debrided.  This was approximately 2 x 3 cm.  Most of the posterior fascia and subcutaneous tissue appeared to be viable.  Details procedure: After the patient was consented she was taken back to the OR and placed in supine position with bilateral SCDs in place.  She was placed under general anesthesia.  She was then placed in lithotomy position.  At this time the patient was prepped and draped in standard fashion.  Timeout was called and all facts verified.  At this time the area of the anterior debridement area was was inspected.  All previous packing was removed.  At this time an area anterior and superior of the most superior portion of room was incised.  This was debrided.  There was some subcutaneous tissue that was necrotic and infected.   This was approximately 5 x 4 cm in size.  Superficial areas of the subcutaneous tissue that was seen at the base of the wound was debrided.  At this time Betadine soaked Kerlix was placed x 1.  The patient was then placed on another operating room bed and placed in the prone position.  All packing was removed.  The area was inspected.  There is superficial areas of the base of the wound that was debrided secondary to necrosis.  There was an area at the superior portion of her wound that was incised and debrided.  This was approximate 2 x 3 cm.  There is some necrotic subcutaneous tissue that was debrided.  At this time Betadine soaked Kerlix was placed x 2.  The Kerlix were knotted together.  At this time this completed the procedure.  The wounds were packed with ABD pad and tape.  Patient tolerated the procedure well was taken to the ICU in critical condition and intubated.     PLAN OF CARE: Admit to inpatient   PATIENT DISPOSITION:  ICU - intubated and critically ill.   Delay start of Pharmacological VTE agent (>24hrs) due to surgical blood loss or risk of bleeding: yes

## 2022-11-05 NOTE — Procedures (Signed)
Arterial Catheter Insertion Procedure Note  Demiyah Fischbach  [id number]  11/03/1985  Date:10/28/2022  Time:9:42 PM    Provider Performing: Mick Sell    Procedure: Insertion of Arterial Line [phone]) with US guidance ([id number])   Indication(s) Blood pressure monitoring and/or need for frequent ABGs  Consent Unable to obtain consent due to emergent nature of procedure.  Anesthesia None   Time Out Verified patient identification, verified procedure, site/side was marked, verified correct patient position, special equipment/implants available, medications/allergies/relevant history reviewed, required imaging and test results available.   Sterile Technique Maximal sterile technique including full sterile barrier drape, hand hygiene, sterile gown, sterile gloves, mask, hair covering, sterile ultrasound probe cover (if used).   Procedure Description Area of catheter insertion was cleaned with chlorhexidine and draped in sterile fashion. With real-time ultrasound guidance an arterial catheter was placed into the left radial artery.  Appropriate arterial tracings confirmed on monitor.     Complications/Tolerance None; patient tolerated the procedure well.   EBL Minimal   Specimen(s) None  JD Rexene Agent [facility] 10/28/2022, 9:42 PM  Please see [email or web address] for pager details.  From 7A-7P if no response, please call [phone]. After hours, please call ELink [phone].

## 2022-11-05 NOTE — Progress Notes (Signed)
PCCM interval note  Called to bedside after patient experienced 3 discrete acute bradycardic episodes, received atropine x 2.  1 of these occurred with suctioning but the others did not.  Now in sinus bradycardia, 55-60.  Blood pressure stable on norepinephrine 4  Vitals:   10/25/2022 1600 11/01/2022 1615 11/21/2022 1630 11/04/2022 1645  BP: (!) 122/56 (!) 123/50 (!) 118/56 (!) 124/52  Pulse: 63 (!) 59 (!) 58 (!) 56  Resp:      Temp:      TempSrc:      SpO2: 98% 100% 100% 100%  Weight:      Height:      Morbidly obese woman intubated.  Minimal response to stimulation, voice, pain.  She did flicker her eyes but no other response.  Pupils are 3 mm and equal.  No other exam changes noted  ABG with no evidence for acidosis.  1 amp sodium bicarb given empirically.  Issues: - Septic shock due to foreign years gangrene of the right perineum, gluteal region, group B strep infection.  Went back to the OR earlier today 1/13 - New sinus bradycardia - New encephalopathy.  She has been on high-dose fentanyl for 2 days, was poorly responsive this morning (expected) but has not cleared her mental status even with significant decrease in her fentanyl.  Plans: -ABG performed as above -Stat CBC, BMP ordered and pending -Hold fentanyl temporarily and follow mental status -Once we deemed stable to undergo, she needs stat head imaging, likely head CT.  Independent critical care time 40 minutes  Baltazar Apo, MD, PhD 11/11/2022, 6:52 PM [facility] [phone] or if no answer before 7:00PM call [phone] For any issues after 7:00PM please call eLink [phone]

## 2022-11-05 NOTE — Progress Notes (Signed)
Notified by RN pt has sluggish blood return from PICC. Caps changed x2. Brisk blood return noted from grey and white lumen. Red lumen flushed without difficulty, brisk blood return noted after repositioning arm. Requested Nikki Dom, RN notify [facility] if this reoccurs.

## 2022-11-05 NOTE — Progress Notes (Signed)
[facility] Progress Note Patient Name: Lindsey [name] DOB: 11/10/1985 MRN: [id number]   Date of Service  11/02/2022  HPI/Events of Note  Hgb 6.2.  no signs of active bleeding  eICU Interventions  Transfusion requested     Intervention Category Intermediate Interventions: Bleeding - evaluation and treatment with blood products  Mauri Brooklyn, P 11/04/2022, 11:21 PM

## 2022-11-05 NOTE — Progress Notes (Signed)
NAME:  Lindsey [name], MRN:  [id number], DOB:  1986/05/04, LOS: 4 ADMISSION DATE:  11/11/2022, CONSULTATION DATE:  11/08/2022 REFERRING MD:  Alvino Chapel CHIEF COMPLAINT:  Fatigue, buttock wounds   History of Present Illness:  Lindsey [name] is a 37 y.o. female who has a PMH as below including DM2. She presented to [facility] 1/9 with fatigue, dyspnea, buttock wounds that had been getting worse. First noticed buttock wound around thanksgiving but it healed on its own. Then recurred around new years and has persisted and worsened to the point of malodorous drainage. She has also had fatigue and dyspnea. She apparently had been eating badly around the holidays and her CBG's had been reading high. Normally she states that CBG's are well controlled.  In ED, she was found to have HHS. 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  While at [facility], she also developed A.Fib RVR for which she received Amiodarone bolus x 2 followed by infusion. She was given one dose of Flagyl and was transferred to [facility] ED for surgical evaluation. En route to [facility], she converted to NSR. BP has been stable since ED presentation.  She was evaluated by surgery and is being taken to the OR for debridement tonight. She is currently on Insulin infusion, Amiodarone infusion, potassium supplementation. Her mother is with her at the bedside.  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  Significant Hospital Events: Including procedures, antibiotic start and stop dates in addition to other pertinent events   CT abdomen pelvis 1/9 > basilar atelectasis, normal stomach, appendix, bowel wall, no hernia, mild to moderate subcutaneous impaired muscular inflammatory fat stranding anterolateral aspect abdominal wall on the left, no ascites.  Soft tissue gas along the perineum on the right with medial and posterior medial right gluteal involvement consistent with Fournier's gangrene OR for debridement 1/9, Nov 15, 2022, 1/13 Blood cultures 1/9 >  Urine culture 1/9 > yeast 70 K Right buttock wound culture 1/10 > GPC, GNR > abundant group B strep, abundant strep mitis PICC placed 1/12  Interim History / Subjective:  Planning back to the OR today 1/13 Fentanyl 400 Precedex 1.8 Norepinephrine for I/O+ 19 L total Bicarbonate drip discontinued Insulin drip transition to off 1/12 PICC placed 1/12  Objective:  Blood pressure (!) 112/57, pulse 60, temperature 99 F (37.2 C), temperature source Oral, resp. rate (!) 0, height 5\' 6"  (1.676 m), weight (!) 218.4 kg, SpO2 100 %. CVP:  [14 mmHg-19 mmHg] 19 mmHg  Vent Mode: PRVC FiO2 (%):  [40 %] 40 % Set Rate:  [28 bmp-35 bmp] 28 bmp Vt Set:  [480 mL] 480 mL PEEP:  [10 cmH20] 10 cmH20 Plateau Pressure:  [28 cmH20-30 cmH20] 28 cmH20   Intake/Output Summary (Last 24 hours) at 11/12/2022 0743 Last data filed at 11/02/2022 0600 Gross per 24 hour  Intake 4734.69 ml  Output 1050 ml  Net 3684.69 ml   Filed Weights   11/02/2022 1858 11/04/22 0408 10/31/2022 0500  Weight: (!) 201.9 kg (!) 215.4 kg Marland Kitchen)  218.4 kg    Examination: General: Obese woman laying in bed critically ill, ventilated Neuro: Grimace with stimulation, tries to open eyes, does not follow commands HEENT: ET tube in place, oropharynx otherwise clear Cardiovascular: Very distant, regular, no  murmur Lungs:.  Distant, few scattered rhonchi, no crackles or wheezes Abdomen: Morbidly obese, hypoactive bowel sounds Musculoskeletal: No edema Skin: No rash   Assessment & Plan:   HHS - presumed 2/2 Fournier's Gangrene. Hx DM 2. 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  Wound culture with group B strep, nontoxin producing, strep mitis -Appreciate surgery management.  Planning back to the operating room today 1/13, likely also on 1/15 -Continue Zosyn, tailor to culture results and sensitivities  Septic shock due to Fournier's gangrene -Adequately volume resuscitated, probably volume up, have reduced IV fluids.  Consider diuresis when blood pressure will allow -Continue supportive care -Wean norepinephrine as able -Antibiotics and surgical care as above  Acute metabolic encephalopathy due to acute infection with septic shock, HHS -Supportive care -High fentanyl requirement -Precedex added 1/12 -Versed pushes available if needed  A.fib with RVR - 2/2 above, converted to NSR after Amiodarone. Hx HTN, HLD. -Off amiodarone -Pravastatin irbesartan and propranolol are on hold  Pseudohyponatremia - 2/2 Hyperglycemia. Hypokalemia, improved AKI, improving NAGMA, multifactorial but principally due to HHS.  Lactic acid cleared Hypocalcemia. -Check ABG intermittently -Follow BMP and urine output -Supplement electrolytes as indicated.  Follow potassium closely especially if we initiate diuresis  Hx Hypothyroidism. -Home levothyroxine dose 75 mcg daily  Hx Anxiety, Depression, Panic disorder, PTSD. -Appreciate pharmacy assistance, clarified home regimen.  She is no longer on most of her outpatient meds, only on quetiapine at night.  Holding for now  Idiopathic  intracranial hypertension -Has been managed with Diamox as an outpatient, currently on hold  Best practice (evaluated daily):  Diet/type: NPO DVT prophylaxis: prophylactic heparin  GI prophylaxis: PPI Lines: Central line and Arterial Line Foley:  Yes, and it is still needed Code Status:  full code Last date of multidisciplinary goals of care discussion: Pending Family: Updated mother at bedside 11-24-22  Labs   CBC: Recent Labs  Lab 10/28/2022 1811 10/25/2022 2024 11/02/22 0500 11/02/22 0801 November 24, 2022 0313 2022/11/24 1014 2022/11/24 1247 Nov 24, 2022 1734 11/04/22 0505 11/04/22 0836  WBC 22.8*  --  29.3*  --  23.3*  --   --   --  27.9*  --   NEUTROABS 18.4*  --   --   --   --   --   --   --   --   --   HGB 11.3*   < > 10.2*   < > 8.7* 9.2* 9.2* 8.2* 7.6* 7.5*  HCT 34.0*   < > 30.7*   < > 25.6* 27.0* 27.0* 23.5* 22.7* 22.0*  MCV 83.1  --  84.1  --  83.4  --   --   --  83.8  --   PLT 240  --  265  --  200  --   --   --  217  --    < > = values in this interval not displayed.    Basic Metabolic Panel: Recent Labs  Lab 11/24/22 0000 11/24/2022 0313 11-24-22 1014 24-Nov-2022 1734 11/04/22 0505 11/04/22 0836 11/04/22 1648 11/02/2022 0344  NA 127*  --    < >  128* 132* 135 134* 134*  K 3.4*  --    < > 3.0* 3.2* 3.4* 4.1 4.3  CL 95*  --   --  95* 97*  --  103 99  CO2 20*  --   --  21* 24  --  22 23  GLUCOSE 261*  --   --  276* 148*  --  136* 207*  BUN 35*  --   --  46* 45*  --  42* 51*  CREATININE 1.80*  --   --  1.40* 1.13*  --  1.48* 1.41*  CALCIUM 6.8*  --   --  6.8* 6.5*  --  6.3* 7.0*  MG  --  2.7*  --   --  2.6*  --   --  2.9*  PHOS  --  3.9  --   --  2.4*  --   --   --    < > = values in this interval not displayed.   GFR: Estimated Creatinine Clearance: 107 mL/min (A) (by C-G formula based on SCr of 1.41 mg/dL (H)). Recent Labs  Lab 11/04/2022 1811 11/23/2022 1942 11/02/22 0500 11/02/22 0930 11/09/22 0313 2022-11-09 0315 11/04/22 0505  WBC 22.8*  --  29.3*  --  23.3*  --  27.9*   LATICACIDVEN 3.5* 2.9*  --  1.6  --  1.0  --     Liver Function Tests: Recent Labs  Lab November 09, 2022 0000  AST 29  ALT 26  ALKPHOS 85  BILITOT 0.9  PROT 5.7*  ALBUMIN 1.5*   No results for input(s): "LIPASE", "AMYLASE" in the last 168 hours. No results for input(s): "AMMONIA" in the last 168 hours.  ABG    Component Value Date/Time   PHART 7.386 11/04/2022 0836   PCO2ART 44.9 11/04/2022 0836   PO2ART 137 (H) 11/04/2022 0836   HCO3 26.8 11/04/2022 0836   TCO2 28 11/04/2022 0836   ACIDBASEDEF 2.0 2022/11/09 1247   O2SAT 99 11/04/2022 0836     Coagulation Profile: No results for input(s): "INR", "PROTIME" in the last 168 hours.  Cardiac Enzymes: No results for input(s): "CKTOTAL", "CKMB", "CKMBINDEX", "TROPONINI" in the last 168 hours.  HbA1C: Hgb A1c MFr Bld  Date/Time Value Ref Range Status  11/19/2022 10:42 PM 14.7 (H) 4.8 - 5.6 % Final    Comment:    (NOTE) Pre diabetes:          5.7%-6.4%  Diabetes:              >6.4%  Glycemic control for   <7.0% adults with diabetes     CBG: Recent Labs  Lab 11/04/22 1845 11/04/22 1917 11/04/22 2310 11/04/2022 0322 10/26/2022 0732  GLUCAP 211* 200* 187* 198* 202*     Critical care time: 33 min.   Levy Pupa, MD, PhD 11/10/2022, 7:43 AM [facility] [phone] or if no answer before 7:00PM call [phone] For any issues after 7:00PM please call eLink [phone]

## 2022-11-05 NOTE — Anesthesia Preprocedure Evaluation (Signed)
Anesthesia Evaluation  Patient identified by MRN, date of birth, ID band  Reviewed: Allergy & Precautions, NPO status , Patient's Chart, lab work & pertinent test results, reviewed documented beta blocker date and time   Airway Mallampati: Intubated  TM Distance: >3 FB Neck ROM: Full    Dental  (+) Dental Advisory Given   Pulmonary neg pulmonary ROS, former smoker    + decreased breath sounds  + intubated    Cardiovascular hypertension, Pt. on home beta blockers and Pt. on medications Normal cardiovascular exam Rhythm:Regular Rate:Normal     Neuro/Psych  Headaches PSYCHIATRIC DISORDERS Anxiety Depression    Pseudotumor cerebri    GI/Hepatic negative GI ROS, Neg liver ROS,,,  Endo/Other  diabetes, Type 2, Oral Hypoglycemic AgentsHypothyroidism  Morbid obesity (BMI 71)  Renal/GU Renal disease ([place])     Musculoskeletal negative musculoskeletal ROS (+)    Abdominal   Peds  Hematology  (+) Blood dyscrasia, anemia   Anesthesia Other Findings Day of surgery medications reviewed with the patient.  Necrotizing fasciitis  Reproductive/Obstetrics                             Anesthesia Physical Anesthesia Plan  ASA: 4  Anesthesia Plan: General   Post-op Pain Management:    Induction: Intravenous  PONV Risk Score and Plan: 3 and Treatment may vary due to age or medical condition  Airway Management Planned: Oral ETT  Additional Equipment: Arterial line and CVP  Intra-op Plan:   Post-operative Plan: Post-operative intubation/ventilation  Informed Consent: I have reviewed the patients History and Physical, chart, labs and discussed the procedure including the risks, benefits and alternatives for the proposed anesthesia with the patient or authorized representative who has indicated his/her understanding and acceptance.     Consent reviewed with POA and History available from chart  only  Plan Discussed with: CRNA  Anesthesia Plan Comments:        Anesthesia Quick Evaluation  

## 2022-11-05 NOTE — Progress Notes (Signed)
RT assisted with transport of this pt from OR back to 2M05 while on full ventilator support. Pt tolerated well with SVS and no complications.

## 2022-11-05 NOTE — Transfer of Care (Signed)
Immediate Anesthesia Transfer of Care Note  Patient: Lindsey [name]  Procedure(s) Performed: EXCISIONAL DEBRIDEMENT PERINEAL WOUND  Patient Location: ICU  Anesthesia Type:General  Level of Consciousness: Patient remains intubated per anesthesia plan  Airway & Oxygen Therapy: Patient remains intubated per anesthesia plan and Patient placed on Ventilator (see vital sign flow sheet for setting)  Post-op Assessment: Report given to RN and Post -op Vital signs reviewed and stable  Post vital signs: Reviewed and stable  Last Vitals: SEE ICU VITALs Vitals Value Taken Time  BP    Temp    Pulse    Resp    SpO2      Last Pain:  Vitals:   11/21/2022 0734  TempSrc: Oral  PainSc:          Complications: No notable events documented.

## 2022-11-05 NOTE — Progress Notes (Signed)
eLink Physician-Brief Progress Note Patient Name: Lindsey [name] DOB: 06/29/1986 MRN: [id number]   Date of Service  11/23/2022  HPI/Events of Note  Episode of bradycardia again after suctioning.  Now sinus rhythm around 49 bpm.  She is on levophed at 50mcg/min.  Systolic bp 248.  eICU Interventions  EKG Fentanyl being decreased given she unresponsive     Intervention Category Intermediate Interventions: Arrhythmia - evaluation and management  Mauri Brooklyn, P 11/10/2022, 8:45 PM

## 2022-11-05 NOTE — Anesthesia Postprocedure Evaluation (Signed)
Anesthesia Post Note  Patient: Lindsey [name]  Procedure(s) Performed: EXCISIONAL DEBRIDEMENT PERINEAL WOUND     Patient location during evaluation: SICU Anesthesia Type: General Level of consciousness: sedated Pain management: pain level controlled Vital Signs Assessment: post-procedure vital signs reviewed and stable Respiratory status: patient remains intubated per anesthesia plan Cardiovascular status: stable Postop Assessment: no apparent nausea or vomiting Anesthetic complications: no   No notable events documented.  Last Vitals:  Vitals:   11/13/2022 1015 11/23/2022 1030  BP:    Pulse: (!) 55 (!) 54  Resp:    Temp:    SpO2: 100% 100%    Last Pain:  Vitals:   10/29/2022 0734  TempSrc: Oral  PainSc:                  Santa Lighter

## 2022-11-05 NOTE — Progress Notes (Signed)
RT at bedside for evening rounds. RT suctioned pt's ETT and pt's HR dropped to 28. RN x3, RT and MD at bedside. With medication, pt's HR increased to 60's. Pt's HR at this time is 67.

## 2022-11-05 NOTE — Progress Notes (Addendum)
Pt had  an episode of brady(HR in the 30s ) atropine given ( half an ample). ABG done at bedside.MD @ bedside to read results. ABG showed Hbg 6.8. STAT BMP ordered.

## 2022-11-05 NOTE — Progress Notes (Signed)
Pt taken to OR this AM

## 2022-11-05 NOTE — Progress Notes (Signed)
Pt had an episode of brady(HR in the 30s ) atropine given ( full  ample 1mg  ). MD @ bedside

## 2022-11-06 ENCOUNTER — Inpatient Hospital Stay (HOSPITAL_COMMUNITY): Payer: Medicaid Other

## 2022-11-06 LAB — HEMOGLOBIN AND HEMATOCRIT, BLOOD
HCT: 23.6 % — ABNORMAL LOW (ref 36.0–46.0)
Hemoglobin: 7.8 g/dL — ABNORMAL LOW (ref 12.0–15.0)

## 2022-11-06 LAB — COMPREHENSIVE METABOLIC PANEL
ALT: 19 U/L (ref 0–44)
AST: 19 U/L (ref 15–41)
Albumin: <1.5 g/dL — ABNORMAL LOW (ref 3.5–5.0)
Alkaline Phosphatase: 83 U/L (ref 38–126)
Anion gap: 8 (ref 5–15)
BUN: 65 mg/dL — ABNORMAL HIGH (ref 6–20)
CO2: 23 mmol/L (ref 22–32)
Calcium: 7.5 mg/dL — ABNORMAL LOW (ref 8.9–10.3)
Chloride: 105 mmol/L (ref 98–111)
Creatinine, Ser: 1.44 mg/dL — ABNORMAL HIGH (ref 0.44–1.00)
GFR, Estimated: 48 mL/min — ABNORMAL LOW (ref >60)
Glucose, Bld: 222 mg/dL — ABNORMAL HIGH (ref 70–99)
Potassium: 4.9 mmol/L (ref 3.5–5.1)
Sodium: 136 mmol/L (ref 135–145)
Total Bilirubin: 1.4 mg/dL — ABNORMAL HIGH (ref 0.3–1.2)
Total Protein: 5.7 g/dL — ABNORMAL LOW (ref 6.5–8.1)

## 2022-11-06 LAB — CULTURE, BLOOD (ROUTINE X 2)
Culture: NO GROWTH
Culture: NO GROWTH
Special Requests: ADEQUATE
Special Requests: ADEQUATE

## 2022-11-06 LAB — GLUCOSE, CAPILLARY
Glucose-Capillary: 150 mg/dL — ABNORMAL HIGH (ref 70–99)
Glucose-Capillary: 174 mg/dL — ABNORMAL HIGH (ref 70–99)
Glucose-Capillary: 276 mg/dL — ABNORMAL HIGH (ref 70–99)
Glucose-Capillary: 308 mg/dL — ABNORMAL HIGH (ref 70–99)
Glucose-Capillary: 294 mg/dL — ABNORMAL HIGH (ref 70–99)
Glucose-Capillary: 335 mg/dL — ABNORMAL HIGH (ref 70–99)

## 2022-11-06 MED ORDER — ATROPINE SULFATE 1 MG/10ML IJ SOSY
PREFILLED_SYRINGE | INTRAMUSCULAR | Status: AC
  Filled 2022-11-06: qty 10

## 2022-11-06 MED ORDER — EPINEPHRINE 1 MG/10ML IJ SOSY
PREFILLED_SYRINGE | INTRAMUSCULAR | Status: AC
  Filled 2022-11-06: qty 10

## 2022-11-06 MED ORDER — INSULIN ASPART 100 UNIT/ML IJ SOLN
10.0000 [IU] | INTRAMUSCULAR | Status: DC
  Administered 2022-11-06 – 2022-11-07 (×6): 10 [IU] via SUBCUTANEOUS

## 2022-11-06 NOTE — Progress Notes (Signed)
Pts flexiseal pulled out during CT scan. During CT pt had a BM and wound/dressing soiled w/ stool. This RN along w/ CRN cleansed and repacked the dressing to the best of our abilities. Surgery PA contacted and awaiting any new orders.

## 2022-11-06 NOTE — Progress Notes (Addendum)
Pt had one more episode of bradycardia and no atrophie was given.  HR came back up after suction. MD notified

## 2022-11-06 NOTE — Progress Notes (Signed)
Pharmacy ICU Insulin Management  CBGs above goal 140-180 mg/dL: Yes  Current regimen (include units of SSI in last 24hr): - Semglee 50u BID - Novolog 6u Q4H  - Resistant SSI (39u / 24hr)  Medications affecting CBG levels: TF - TF remain at trickle (25 mL/hr) given intermittent OR procedures  Plan: - Continue Semglee 50u BID - Increase Novolog TF coverage to 10u Q4H - Continue resistant SSI - F/u ability to increase TF to goal  Erskine Speed, PharmD Clinical Pharmacist

## 2022-11-06 NOTE — Progress Notes (Signed)
NAME:  Lindsey [name], MRN:  [id number], DOB:  1986/02/03, LOS: 5 ADMISSION DATE:  11/18/2022, CONSULTATION DATE:  11/14/2022 REFERRING MD:  Alvino Chapel CHIEF COMPLAINT:  Fatigue, buttock wounds   History of Present Illness:  Lindsey [name] is a 37 y.o. female who has a PMH as below including DM2. She presented to [facility] 1/9 with fatigue, dyspnea, buttock wounds that had been getting worse. First noticed buttock wound around thanksgiving but it healed on its own. Then recurred around new years and has persisted and worsened to the point of malodorous drainage. She has also had fatigue and dyspnea. She apparently had been eating badly around the holidays and her CBG's had been reading high. Normally she states that CBG's are well controlled.  In ED, she was found to have HHS. 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  While at [facility], she also developed A.Fib RVR for which she received Amiodarone bolus x 2 followed by infusion. She was given one dose of Flagyl and was transferred to [facility] ED for surgical evaluation. En route to [facility], she converted to NSR. BP has been stable since ED presentation.  She was evaluated by surgery and is being taken to the OR for debridement tonight. She is currently on Insulin infusion, Amiodarone infusion, potassium supplementation. Her mother is with her at the bedside.  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  Significant Hospital Events: Including procedures, antibiotic start and stop dates in addition to other pertinent events   CT abdomen pelvis 1/9 > basilar atelectasis, normal stomach, appendix, bowel wall, no hernia, mild to moderate subcutaneous impaired muscular inflammatory fat stranding anterolateral aspect abdominal wall on the left, no ascites.  Soft tissue gas along the perineum on the right with medial and posterior medial right gluteal involvement consistent with Fournier's gangrene OR for debridement 1/9, 1/11, 1/13 Blood cultures 1/9 >  Urine culture 1/9 > yeast 70 K Right buttock wound culture 1/10 > GPC, GNR > abundant group B strep, abundant strep mitis PICC placed 1/12  Interim History / Subjective:  Went back to the OR 1/13 Periods of abrupt bradycardia beginning 1/13 in the afternoon, some associated with suctioning and advancement of her ET tube 1 cm.  Received atropine.  Norepinephrine remains at 4 with adequate blood pressure Decreased mental status, sedating medications decreased: Fentanyl 50, then turned off.  Precedex is off.  Patient remains unresponsive head CT has been ordered but not yet done I/O+ 20.1 L total  Objective:  Blood pressure (!) 102/50, pulse (!) 49, temperature 98.9 F (37.2 C), temperature source Oral, resp. rate (!) 25, height 5\' 6"  (1.676 m), weight (!) 217.6 kg, SpO2 97 %. CVP:  [10 mmHg-25 mmHg] 20 mmHg  Vent Mode: PRVC FiO2 (%):  [40 %-50 %] 40 % Set Rate:  [28 bmp] 28 bmp Vt Set:  [480 mL] 480 mL PEEP:  [10 cmH20] 10 cmH20 Plateau Pressure:  [20 cmH20-27 cmH20] 27 cmH20   Intake/Output Summary (Last 24 hours)  at 11/06/2022 0721 Last data filed at 11/06/2022 0553 Gross per 24 hour  Intake 2887.02 ml  Output 2725 ml  Net 162.02 ml   Filed Weights   11/04/22 0408 Dec 01, 2022 0500 11/06/22 0500  Weight: (!) 215.4 kg (!) 218.4 kg (!)  217.6 kg    Examination: General: Obese woman laying in bed.  Poorly responsive, ventilated Neuro: Unresponsive, spontaneously opening eyes, not localizing, not tracking or following commands HEENT: ET tube in place, oropharynx otherwise clear.  Pupils remain equal Cardiovascular: Very distant, regular, bradycardic, no murmur Lungs:.  Decreased to both bases, no wheezes or crackles Abdomen: Morbidly obese, hypoactive bowel sounds Musculoskeletal: No edema Skin: No rash  Chest x-ray 1/14 reviewed by me shows bilateral interstitial infiltrates, ET tube just below the level of clavicles  Assessment & Plan:   HHS - presumed 2/2 Fournier's Gangrene. Hx DM 2. 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  Wound culture with group B strep, nontoxin producing, strep mitis -Greatly appreciate surgery management.  Going back and forth to the OR as needed for wound care and debridement.  Possibly back on 1/15 -Continue Zosyn, tailor to culture results and sensitivities.  Shows strep mitis, consider narrowing although could be a polymicrobial infection  Septic shock due to Fournier's gangrene -Adequately volume resuscitated.  Not really in a position hemodynamically to initiate diuresis -Wean norepinephrine as able -Antibiotics and surgical care as above  Acute respiratory failure due to the above, also possible contribution of evolving bilateral pulmonary infiltrates with total body volume overload, pulmonary edema -Continue PRVC 8 cc/kg -Evolving pulmonary infiltrates, would likely benefit from diuresis when she can hemodynamically tolerate  Sinus bradycardia with pauses.  Initially thought to be vagal with suctioning, etc.  Consider effects of accumulated sedating medications, fentanyl Precedex (now  off) -Follow telemetry closely -Optimize electrolytes -Avoid any medications that would affect QTc, heart rate  Acute metabolic encephalopathy initially due to HHS, acute infection with septic shock.  Also likely contribution of her-no requirement.  Over last 24 hours less responsive even though fentanyl has been weaned, now off.  Precedex is off as well -Her suppressed mental status could be related to accumulation of her high-dose fentanyl but concerned that there may have been an acute neurological event. -Likely needs head CT to evaluate but will need to confirm that she can fit on the scanner, we can move her safely given her size, wound, episodes of bradycardia  A.fib with RVR - 2/2 above, converted to NSR after Amiodarone. Hx HTN, HLD. -Amiodarone stopped when she converted to NSR -Continue to hold pravastatin, irbesartan, propranolol  Hyponatremia, resolved Hypokalemia, resolved AKI NAGMA, multifactorial but principally due to HHS.  Improved and lactic acid cleared Hypocalcemia. -Following ABG as needed -Follow BMP and urine output -Supplement electrolytes as indicated -Follow potassium closely if we initiate diuresis  Hx Hypothyroidism. -Continue Home levothyroxine dose 75 mcg daily  Hx Anxiety, Depression, Panic disorder, PTSD. -Appreciate pharmacy assistance, clarified home regimen.  She is no longer on most of her outpatient meds, only on quetiapine at night.  Holding for now  Idiopathic intracranial hypertension -Has been managed with Diamox as an outpatient, continue to hold for now  Best practice (evaluated daily):  Diet/type: NPO DVT prophylaxis: prophylactic heparin  GI prophylaxis: PPI Lines: Central line and Arterial Line Foley:  Yes, and  it is still needed Code Status:  full code Last date of multidisciplinary goals of care discussion: Pending Family: Updated mother at bedside 1/14  Labs   CBC: Recent Labs  Lab 10/26/2022 1811 11/02/2022 2024  11/02/22 0500 11/02/22 0801 11/22/2022 0313 11/09/2022 1014 11/13/2022 1734 11/04/22 0505 11/04/22 0836 11/04/2022 1836 10/31/2022 2236  WBC 22.8*  --  29.3*  --  23.3*  --   --  27.9*  --   --  25.2*  NEUTROABS 18.4*  --   --   --   --   --   --   --   --   --   --   HGB 11.3*   < > 10.2*   < > 8.7*   < > 8.2* 7.6* 7.5* 6.8* 6.2*  HCT 34.0*   < > 30.7*   < > 25.6*   < > 23.5* 22.7* 22.0* 20.0* 18.4*  MCV 83.1  --  84.1  --  83.4  --   --  83.8  --   --  85.6  PLT 240  --  265  --  200  --   --  217  --   --  166   < > = values in this interval not displayed.    Basic Metabolic Panel: Recent Labs  Lab 11/20/2022 0313 11/02/2022 1014 11/04/22 0505 11/04/22 0836 11/04/22 1648 10/26/2022 0344 11/11/2022 1814 10/26/2022 1836 11/06/22 0050  NA  --    < > 132*   < > 134* 134* 134* 137 136  K  --    < > 3.2*   < > 4.1 4.3 4.5 5.0 4.9  CL  --    < > 97*  --  103 99 101  --  105  CO2  --    < > 24  --  22 23 23   --  23  GLUCOSE  --    < > 148*  --  136* 207* 240*  --  222*  BUN  --    < > 45*  --  42* 51* 61*  --  65*  CREATININE  --    < > 1.13*  --  1.48* 1.41* 1.32*  --  1.44*  CALCIUM  --    < > 6.5*  --  6.3* 7.0* 7.2*  --  7.5*  MG 2.7*  --  2.6*  --   --  2.9* 3.0*  --   --   PHOS 3.9  --  2.4*  --   --   --   --   --   --    < > = values in this interval not displayed.   GFR: Estimated Creatinine Clearance: 104.5 mL/min (A) (by C-G formula based on SCr of 1.44 mg/dL (H)). Recent Labs  Lab 11/09/2022 1811 10/26/2022 1942 11/02/22 0500 11/02/22 0930 11/12/2022 0313 11/17/2022 0315 11/04/22 0505 11/04/2022 2236  WBC 22.8*  --  29.3*  --  23.3*  --  27.9* 25.2*  LATICACIDVEN 3.5* 2.9*  --  1.6  --  1.0  --   --     Liver Function Tests: Recent Labs  Lab 11/14/2022 0000 11/06/22 0050  AST 29 19  ALT 26 19  ALKPHOS 85 83  BILITOT 0.9 1.4*  PROT 5.7* 5.7*  ALBUMIN 1.5* <1.5*   No results for input(s): "LIPASE", "AMYLASE" in the last 168 hours. No results for input(s): "AMMONIA" in the  last 168 hours.  ABG  Component Value Date/Time   PHART 7.354 10/27/2022 1836   PCO2ART 44.4 11/06/2022 1836   PO2ART 121 (H) 11/13/2022 1836   HCO3 24.7 11/11/2022 1836   TCO2 26 11/06/2022 1836   ACIDBASEDEF 1.0 11/22/2022 1836   O2SAT 99 11/16/2022 1836     Coagulation Profile: No results for input(s): "INR", "PROTIME" in the last 168 hours.  Cardiac Enzymes: No results for input(s): "CKTOTAL", "CKMB", "CKMBINDEX", "TROPONINI" in the last 168 hours.  HbA1C: Hgb A1c MFr Bld  Date/Time Value Ref Range Status  11/08/2022 10:42 PM 14.7 (H) 4.8 - 5.6 % Final    Comment:    (NOTE) Pre diabetes:          5.7%-6.4%  Diabetes:              >6.4%  Glycemic control for   <7.0% adults with diabetes     CBG: Recent Labs  Lab 10/25/2022 1134 11/04/2022 1532 11/10/2022 1940 11/04/2022 2333 11/06/22 0331  GLUCAP 276* 252* 177* 238* 150*     Critical care time: 35 min.   Baltazar Apo, MD, PhD 11/06/2022, 7:21 AM [facility] [phone] or if no answer before 7:00PM call [phone] For any issues after 7:00PM please call eLink [phone]

## 2022-11-06 NOTE — Progress Notes (Signed)
RT NOTE:  ETT advanced 1 cm per MD order. Patient went bradycardic as soon as ETT was advanced.  RN at the bedside and MD was notified.

## 2022-11-06 NOTE — Progress Notes (Signed)
RT assisted with transport of this pt from 2M05 to CT and back while on full ventilatory support. Pt tolerated well with SVS and no complications.

## 2022-11-06 NOTE — Progress Notes (Signed)
[facility] Progress Note  1 Day Post-Op  Subjective: CC:  Episodes of bradycardia yesterday evening and HR currently sinus bradycardia 50-52 bpm. All sedation is off as of yesterday evening. Mother and grandmother are at bedside.   CBC this AM pending, got 2 u pRBC last 24h for hgb 6.4  Objective: Vital signs in last 24 hours: Temp:  [98.6 F (37 C)-100.1 F (37.8 C)] 98.6 F (37 C) (01/14 0700) Pulse Rate:  [49-67] 52 (01/14 0900) Resp:  [0-29] 26 (01/14 0900) BP: (86-151)/(28-87) 102/52 (01/14 0900) SpO2:  [94 %-100 %] 97 % (01/14 0900) Arterial Line BP: (79-181)/(38-86) 140/44 (01/14 0900) FiO2 (%):  [40 %-50 %] 40 % (01/14 0747) Weight:  [217.6 kg] 217.6 kg (01/14 0500) Last BM Date :  (Rectal tube in place.)  Intake/Output from previous day: 01/13 0701 - 01/14 0700 In: 3115.9 [I.V.:1630.6; Blood:646.3; NG/GT:700; IV Piggyback:139.1] Out: 2725 [Urine:1525] Intake/Output this shift: Total I/O In: 89.2 [I.V.:30; NG/GT:50; IV Piggyback:9.2] Out: -   PE: Gen:  critically ill appearing black female, intubated  Card:  sinus bradycardia Pulm:  ventilated respirations Abd: Soft, non-tender, non-distended GU: dressing in place  Skin: warm and dry, no rashes  Psych: unable to assess Neuro: opening eyes to voice, intermittently responding to noxious stimuli, not FC, not tracking me with her eyes.   Lab Results:  Recent Labs    11/04/22 0505 11/04/22 0836 11/16/2022 1836 11/04/2022 2236  WBC 27.9*  --   --  25.2*  HGB 7.6*   < > 6.8* 6.2*  HCT 22.7*   < > 20.0* 18.4*  PLT 217  --   --  166   < > = values in this interval not displayed.   BMET Recent Labs    10/24/2022 1814 11/08/2022 1836 11/06/22 0050  NA 134* 137 136  K 4.5 5.0 4.9  CL 101  --  105  CO2 23  --  23  GLUCOSE 240*  --  222*  BUN 61*  --  65*  CREATININE 1.32*  --  1.44*  CALCIUM 7.2*  --  7.5*   PT/INR No results for input(s): "LABPROT", "INR" in the last 72 hours. CMP      Component Value Date/Time   NA 136 11/06/2022 0050   K 4.9 11/06/2022 0050   CL 105 11/06/2022 0050   CO2 23 11/06/2022 0050   GLUCOSE 222 (H) 11/06/2022 0050   BUN 65 (H) 11/06/2022 0050   CREATININE 1.44 (H) 11/06/2022 0050   CALCIUM 7.5 (L) 11/06/2022 0050   PROT 5.7 (L) 11/06/2022 0050   ALBUMIN <1.5 (L) 11/06/2022 0050   AST 19 11/06/2022 0050   ALT 19 11/06/2022 0050   ALKPHOS 83 11/06/2022 0050   BILITOT 1.4 (H) 11/06/2022 0050   GFRNONAA 48 (L) 11/06/2022 0050   GFRAA >60 07/03/2016 0004   Lipase  No results found for: "LIPASE"     Studies/Results: DG CHEST PORT 1 VIEW  Result Date: 11/06/2022 CLINICAL DATA:  Intubated EXAM: PORTABLE CHEST 1 VIEW COMPARISON:  Prior chest x-ray obtained earlier today FINDINGS: External defibrillator pads project over the chest. The patient is intubated. The tip of the endotracheal tube is 4.3 cm above the carina. Right IJ central venous catheter is present. The tip overlies the upper SVC. A gastric tube is present. The tip lies off the field of view presumably within the stomach. Significant cardiomegaly. Marked pulmonary edema. Very poor visualization due to body habitus and underpenetration. IMPRESSION: Limited evaluation  due to body habitus and poor penetration of the x-ray. Support apparatus grossly stable and satisfactory. Persistent very low inspiratory volumes, cardiomegaly and pulmonary edema. Electronically Signed   By: Jacqulynn Cadet M.D.   On: 11/06/2022 07:32   DG CHEST PORT 1 VIEW  Result Date: 11/06/2022 CLINICAL DATA:  Check endotracheal tube placement EXAM: PORTABLE CHEST 1 VIEW COMPARISON:  11/02/2022 FINDINGS: Endotracheal tube is noted in satisfactory position 4.8 cm above the carina. Right jugular central line and gastric catheter appear within normal limits. Cardiac shadow is enlarged but accentuated by the portable technique. Poor inspiratory effort is noted. No focal infiltrate is seen. Central vascular prominence is  noted likely accentuated by the poor inspiratory effort. No bony abnormality is noted. IMPRESSION: Tubes and lines in satisfactory position. Central vascular congestion accentuated by the poor inspiratory effort. Electronically Signed   By: Inez Catalina M.D.   On: 11/06/2022 01:03   [name] EKG SITE RITE  Result Date: 11/04/2022 If Site Rite image not attached, placement could not be confirmed due to current cardiac rhythm.   Anti-infectives: Anti-infectives (From admission, onward)    Start     Dose/Rate Route Frequency Ordered Stop   11/02/2022 1600  piperacillin-tazobactam (ZOSYN) IVPB 3.375 g        3.375 g 12.5 mL/hr over 240 Minutes Intravenous Every 8 hours 11/16/2022 1459     11/02/22 1400  linezolid (ZYVOX) IVPB 600 mg  Status:  Discontinued        600 mg 300 mL/hr over 60 Minutes Intravenous Every 12 hours 11/02/22 1222 11/04/22 0830   11/02/22 1000  vancomycin (VANCOREADY) IVPB 1250 mg/250 mL  Status:  Discontinued        1,250 mg 166.7 mL/hr over 90 Minutes Intravenous Every 12 hours 11/21/2022 2135 11/18/2022 2137   11/02/22 1000  vancomycin (VANCOREADY) IVPB 1500 mg/300 mL  Status:  Discontinued        1,500 mg 150 mL/hr over 120 Minutes Intravenous Every 12 hours 11/11/2022 2137 11/02/22 1222   11/02/22 0300  ceFEPIme (MAXIPIME) 2 g in sodium chloride 0.9 % 100 mL IVPB  Status:  Discontinued        2 g 200 mL/hr over 30 Minutes Intravenous Every 8 hours 11/02/2022 2135 10/30/2022 2258   11/02/22 0045  vancomycin (VANCOREADY) IVPB 2000 mg/400 mL        2,000 mg 200 mL/hr over 120 Minutes Intravenous  Once 11/02/22 0030 11/02/22 0305   11/08/2022 2315  piperacillin-tazobactam (ZOSYN) IVPB 3.375 g  Status:  Discontinued        3.375 g 12.5 mL/hr over 240 Minutes Intravenous Every 8 hours 11/12/2022 2306 11/20/2022 1459   10/26/2022 2315  clindamycin (CLEOCIN) IVPB 900 mg  Status:  Discontinued        900 mg 100 mL/hr over 30 Minutes Intravenous Every 8 hours 11/23/2022 2306 11/02/22 1222   11/12/2022  1930  vancomycin (VANCOCIN) IVPB 1000 mg/200 mL premix  Status:  Discontinued       See Hyperspace for full Linked Orders Report.   1,000 mg 200 mL/hr over 60 Minutes Intravenous  Once 11/20/2022 1823 11/02/22 0029   10/29/2022 1830  ceFEPIme (MAXIPIME) 2 g in sodium chloride 0.9 % 100 mL IVPB        2 g 200 mL/hr over 30 Minutes Intravenous  Once 11/06/2022 1819 11/06/2022 2016   10/30/2022 1830  metroNIDAZOLE (FLAGYL) IVPB 500 mg        500 mg 100 mL/hr over 60 Minutes  Intravenous  Once 10/31/2022 1819 11/15/2022 2130   10/27/2022 1830  vancomycin (VANCOCIN) IVPB 1000 mg/200 mL premix  Status:  Discontinued        1,000 mg 200 mL/hr over 60 Minutes Intravenous  Once 10/28/2022 1819 11/02/2022 1823   11/22/2022 1830  vancomycin (VANCOCIN) IVPB 1000 mg/200 mL premix  Status:  Discontinued       See Hyperspace for full Linked Orders Report.   1,000 mg 200 mL/hr over 60 Minutes Intravenous  Once 11/16/2022 1823 11/02/22 0029        Assessment/Plan s/p debridement of buttock NSTI  11/20/2022 Dr. Derrell Lolling  S/p excisional debridement buttock and perineal wound 11-06-2022 Dr. Dwain Sarna S/p excisional debridement perineal wound 10/29/2022 Dr. Derrell Lolling -leave packing in place today - tentatively pending OR tomorrow (Monday). will need to be prone and in lithotomy in OR (used two OR beds). I obtained consent with help of RN and placed on pts chart. - undergoing head CT/neurologic workup currently by CCM, will follow result and adjust operative plans accordingly. -cont abx therapy, prelim cx  MODERATE GRAM POSITIVE COCCI IN PAIRS AND CHAINS  MODERATE GRAM NEGATIVE RODS - GBS and strep mitis/oralis   FEN - NPO on vent, tube feeds VTE - heparin ID - Clindamycin, Zosyn, Vanc   Septic shock - secondary to above, improving.  Appreciate CCM's care of this patient as she is critically ill ARF - cr 1.44 from 1.32  DM - on insulin gtt  Morbid obesity - BMI 72 Hypothryoidism PCOS HLD Anxiety/Depression   LOS: 5 days   I  reviewed nursing notes, hospitalist notes, last 24 h vitals and pain scores, last 48 h intake and output, last 24 h labs and trends, and last 24 h imaging results.    Hosie Spangle, PA-C [facility] Please see Amion for pager number during day hours 7:00am-4:30pm

## 2022-11-07 ENCOUNTER — Inpatient Hospital Stay (HOSPITAL_COMMUNITY): Payer: Medicaid Other

## 2022-11-07 ENCOUNTER — Encounter (HOSPITAL_COMMUNITY): Payer: Self-pay | Admitting: General Surgery

## 2022-11-07 ENCOUNTER — Inpatient Hospital Stay (HOSPITAL_COMMUNITY): Payer: Medicaid Other | Admitting: Certified Registered Nurse Anesthetist

## 2022-11-07 ENCOUNTER — Other Ambulatory Visit: Payer: Self-pay

## 2022-11-07 ENCOUNTER — Encounter (HOSPITAL_COMMUNITY): Admission: EM | Disposition: E | Payer: Self-pay | Source: Home / Self Care | Attending: Internal Medicine

## 2022-11-07 DIAGNOSIS — Z87891 Personal history of nicotine dependence: Secondary | ICD-10-CM

## 2022-11-07 DIAGNOSIS — M726 Necrotizing fasciitis: Secondary | ICD-10-CM

## 2022-11-07 DIAGNOSIS — F418 Other specified anxiety disorders: Secondary | ICD-10-CM

## 2022-11-07 DIAGNOSIS — I10 Essential (primary) hypertension: Secondary | ICD-10-CM

## 2022-11-07 HISTORY — PX: WOUND DEBRIDEMENT: SHX247

## 2022-11-07 HISTORY — PX: IRRIGATION AND DEBRIDEMENT ABSCESS: SHX5252

## 2022-11-07 LAB — POCT I-STAT 7, (LYTES, BLD GAS, ICA,H+H)
Acid-Base Excess: 1 mmol/L (ref 0.0–2.0)
Bicarbonate: 27.4 mmol/L (ref 20.0–28.0)
Calcium, Ion: 1.15 mmol/L (ref 1.15–1.40)
HCT: 42 % (ref 36.0–46.0)
Hemoglobin: 14.3 g/dL (ref 12.0–15.0)
O2 Saturation: 97 %
Patient temperature: 97.5
Potassium: 3.9 mmol/L (ref 3.5–5.1)
Sodium: 147 mmol/L — ABNORMAL HIGH (ref 135–145)
TCO2: 29 mmol/L (ref 22–32)
pCO2 arterial: 50 mmHg — ABNORMAL HIGH (ref 32–48)
pH, Arterial: 7.343 — ABNORMAL LOW (ref 7.35–7.45)
pO2, Arterial: 95 mmHg (ref 83–108)

## 2022-11-07 LAB — CBC
HCT: 26.5 % — ABNORMAL LOW (ref 36.0–46.0)
HCT: 25.8 % — ABNORMAL LOW (ref 36.0–46.0)
Hemoglobin: 8.4 g/dL — ABNORMAL LOW (ref 12.0–15.0)
Hemoglobin: 8.1 g/dL — ABNORMAL LOW (ref 12.0–15.0)
MCH: 28.5 pg (ref 26.0–34.0)
MCH: 28.3 pg (ref 26.0–34.0)
MCHC: 31.7 g/dL (ref 30.0–36.0)
MCHC: 31.4 g/dL (ref 30.0–36.0)
MCV: 89.8 fL (ref 80.0–100.0)
MCV: 90.2 fL (ref 80.0–100.0)
Platelets: 187 10*3/uL (ref 150–400)
Platelets: 204 10*3/uL (ref 150–400)
RBC: 2.95 MIL/uL — ABNORMAL LOW (ref 3.87–5.11)
RBC: 2.86 MIL/uL — ABNORMAL LOW (ref 3.87–5.11)
RDW: 16.7 % — ABNORMAL HIGH (ref 11.5–15.5)
RDW: 16.6 % — ABNORMAL HIGH (ref 11.5–15.5)
WBC: 35.9 10*3/uL — ABNORMAL HIGH (ref 4.0–10.5)
WBC: 34.9 10*3/uL — ABNORMAL HIGH (ref 4.0–10.5)
nRBC: 0.4 % — ABNORMAL HIGH (ref 0.0–0.2)
nRBC: 0.5 % — ABNORMAL HIGH (ref 0.0–0.2)

## 2022-11-07 LAB — MAGNESIUM
Magnesium: 2.9 mg/dL — ABNORMAL HIGH (ref 1.7–2.4)
Magnesium: 3 mg/dL — ABNORMAL HIGH (ref 1.7–2.4)

## 2022-11-07 LAB — TYPE AND SCREEN
ABO/RH(D): A POS
Antibody Screen: NEGATIVE
Unit division: 0
Unit division: 0

## 2022-11-07 LAB — BASIC METABOLIC PANEL
Anion gap: 9 (ref 5–15)
Anion gap: 12 (ref 5–15)
BUN: 71 mg/dL — ABNORMAL HIGH (ref 6–20)
BUN: 66 mg/dL — ABNORMAL HIGH (ref 6–20)
CO2: 23 mmol/L (ref 22–32)
CO2: 24 mmol/L (ref 22–32)
Calcium: 7.6 mg/dL — ABNORMAL LOW (ref 8.9–10.3)
Calcium: 7.6 mg/dL — ABNORMAL LOW (ref 8.9–10.3)
Chloride: 107 mmol/L (ref 98–111)
Chloride: 105 mmol/L (ref 98–111)
Creatinine, Ser: 1.19 mg/dL — ABNORMAL HIGH (ref 0.44–1.00)
Creatinine, Ser: 1.19 mg/dL — ABNORMAL HIGH (ref 0.44–1.00)
GFR, Estimated: >60 mL/min (ref >60)
GFR, Estimated: >60 mL/min (ref >60)
Glucose, Bld: 335 mg/dL — ABNORMAL HIGH (ref 70–99)
Glucose, Bld: 333 mg/dL — ABNORMAL HIGH (ref 70–99)
Potassium: 3.6 mmol/L (ref 3.5–5.1)
Potassium: 3.8 mmol/L (ref 3.5–5.1)
Sodium: 139 mmol/L (ref 135–145)
Sodium: 141 mmol/L (ref 135–145)

## 2022-11-07 LAB — GLUCOSE, CAPILLARY
Glucose-Capillary: 349 mg/dL — ABNORMAL HIGH (ref 70–99)
Glucose-Capillary: 305 mg/dL — ABNORMAL HIGH (ref 70–99)
Glucose-Capillary: 198 mg/dL — ABNORMAL HIGH (ref 70–99)
Glucose-Capillary: 245 mg/dL — ABNORMAL HIGH (ref 70–99)
Glucose-Capillary: 247 mg/dL — ABNORMAL HIGH (ref 70–99)
Glucose-Capillary: 239 mg/dL — ABNORMAL HIGH (ref 70–99)
Glucose-Capillary: 213 mg/dL — ABNORMAL HIGH (ref 70–99)
Glucose-Capillary: 222 mg/dL — ABNORMAL HIGH (ref 70–99)
Glucose-Capillary: 204 mg/dL — ABNORMAL HIGH (ref 70–99)
Glucose-Capillary: 223 mg/dL — ABNORMAL HIGH (ref 70–99)

## 2022-11-07 LAB — BPAM RBC
Blood Product Expiration Date: 202402102359
Blood Product Expiration Date: 202402112359
ISSUE DATE / TIME: 202401132335
ISSUE DATE / TIME: 202401140148
Unit Type and Rh: 6200
Unit Type and Rh: 6200

## 2022-11-07 LAB — PREPARE RBC (CROSSMATCH)

## 2022-11-07 LAB — PHOSPHORUS
Phosphorus: 3.6 mg/dL (ref 2.5–4.6)
Phosphorus: 3.6 mg/dL (ref 2.5–4.6)

## 2022-11-07 LAB — AEROBIC/ANAEROBIC CULTURE W GRAM STAIN (SURGICAL/DEEP WOUND)

## 2022-11-07 SURGERY — IRRIGATION AND DEBRIDEMENT ABSCESS
Anesthesia: General | Site: Vagina

## 2022-11-07 MED ORDER — OXYCODONE-ACETAMINOPHEN 7.5-325 MG PO TABS: 1.0000 | ORAL_TABLET | ORAL | Status: DC | PRN

## 2022-11-07 MED ORDER — INSULIN ASPART 100 UNIT/ML IJ SOLN
13.0000 [IU] | INTRAMUSCULAR | Status: DC
  Administered 2022-11-07: 13 [IU] via SUBCUTANEOUS

## 2022-11-07 MED ORDER — DOCUSATE SODIUM 50 MG/5ML PO LIQD
100.0000 mg | Freq: Two times a day (BID) | ORAL | Status: DC
  Administered 2022-11-07 – 2022-11-09 (×5): 100 mg
  Filled 2022-11-07 (×5): qty 10

## 2022-11-07 MED ORDER — INSULIN GLARGINE-YFGN 100 UNIT/ML ~~LOC~~ SOLN
60.0000 [IU] | Freq: Two times a day (BID) | SUBCUTANEOUS | Status: DC
  Administered 2022-11-07: 30 [IU] via SUBCUTANEOUS
  Filled 2022-11-07 (×3): qty 0.6

## 2022-11-07 MED ORDER — SODIUM CHLORIDE 0.9% IV SOLUTION: Freq: Once | INTRAVENOUS | Status: DC

## 2022-11-07 MED ORDER — FENTANYL CITRATE PF 50 MCG/ML IJ SOSY
50.0000 ug | PREFILLED_SYRINGE | Freq: Once | INTRAMUSCULAR | Status: AC | PRN
  Administered 2022-11-07: 50 ug via INTRAVENOUS

## 2022-11-07 MED ORDER — POTASSIUM CHLORIDE 20 MEQ PO PACK
40.0000 meq | PACK | Freq: Once | ORAL | Status: AC
  Administered 2022-11-07: 40 meq
  Filled 2022-11-07: qty 2

## 2022-11-07 MED ORDER — OXYCODONE-ACETAMINOPHEN 7.5-325 MG PO TABS
1.0000 | ORAL_TABLET | Freq: Four times a day (QID) | ORAL | Status: DC
  Filled 2022-11-07: qty 1

## 2022-11-07 MED ORDER — FENTANYL CITRATE (PF) 250 MCG/5ML IJ SOLN
INTRAMUSCULAR | Status: DC | PRN
  Administered 2022-11-07 (×2): 50 ug via INTRAVENOUS
  Administered 2022-11-07: 100 ug via INTRAVENOUS
  Administered 2022-11-07: 50 ug via INTRAVENOUS

## 2022-11-07 MED ORDER — LACTATED RINGERS IV SOLN: INTRAVENOUS | Status: DC | PRN

## 2022-11-07 MED ORDER — 0.9 % SODIUM CHLORIDE (POUR BTL) OPTIME
TOPICAL | Status: DC | PRN
  Administered 2022-11-07: 1000 mL

## 2022-11-07 MED ORDER — ONDANSETRON HCL 4 MG/2ML IJ SOLN
INTRAMUSCULAR | Status: AC
  Filled 2022-11-07: qty 2

## 2022-11-07 MED ORDER — HYDRALAZINE HCL 20 MG/ML IJ SOLN
5.0000 mg | INTRAMUSCULAR | Status: DC | PRN
  Administered 2022-11-07 – 2022-11-08 (×3): 10 mg via INTRAVENOUS
  Filled 2022-11-07 (×3): qty 1

## 2022-11-07 MED ORDER — FENTANYL CITRATE (PF) 250 MCG/5ML IJ SOLN
INTRAMUSCULAR | Status: AC
  Filled 2022-11-07: qty 5

## 2022-11-07 MED ORDER — ALBUMIN HUMAN 25 % IV SOLN
12.5000 g | Freq: Once | INTRAVENOUS | Status: AC
  Administered 2022-11-07: 12.5 g via INTRAVENOUS
  Filled 2022-11-07: qty 50

## 2022-11-07 MED ORDER — FENTANYL CITRATE PF 50 MCG/ML IJ SOSY
25.0000 ug | PREFILLED_SYRINGE | INTRAMUSCULAR | Status: DC | PRN
  Administered 2022-11-07 (×2): 50 ug via INTRAVENOUS
  Filled 2022-11-07 (×3): qty 1

## 2022-11-07 MED ORDER — HYDRALAZINE HCL 10 MG PO TABS
10.0000 mg | ORAL_TABLET | Freq: Four times a day (QID) | ORAL | Status: DC | PRN
  Filled 2022-11-07: qty 1

## 2022-11-07 MED ORDER — VITAL 1.5 CAL PO LIQD
1000.0000 mL | ORAL | Status: DC
  Administered 2022-11-07 – 2022-11-09 (×6): 1000 mL

## 2022-11-07 MED ORDER — INSULIN REGULAR(HUMAN) IN NACL 100-0.9 UT/100ML-% IV SOLN
INTRAVENOUS | Status: AC
  Administered 2022-11-07: 10.5 [IU]/h via INTRAVENOUS
  Administered 2022-11-07: 11.4 [IU]/h via INTRAVENOUS
  Administered 2022-11-08: 12 [IU]/h via INTRAVENOUS
  Administered 2022-11-08: 13 [IU]/h via INTRAVENOUS
  Administered 2022-11-08: 10 [IU]/h via INTRAVENOUS
  Administered 2022-11-09: 9 [IU]/h via INTRAVENOUS
  Filled 2022-11-07 (×4): qty 100
  Filled 2022-11-07: qty 200

## 2022-11-07 MED ORDER — LABETALOL HCL 5 MG/ML IV SOLN: 10.0000 mg | INTRAVENOUS | Status: DC | PRN

## 2022-11-07 MED ORDER — INSULIN ASPART 100 UNIT/ML IJ SOLN: 13.0000 [IU] | INTRAMUSCULAR | Status: DC

## 2022-11-07 MED ORDER — FUROSEMIDE 10 MG/ML IJ SOLN
40.0000 mg | Freq: Once | INTRAMUSCULAR | Status: AC
  Administered 2022-11-07: 40 mg via INTRAVENOUS
  Filled 2022-11-07: qty 4

## 2022-11-07 MED ORDER — ETOMIDATE 2 MG/ML IV SOLN
INTRAVENOUS | Status: AC
  Filled 2022-11-07: qty 10

## 2022-11-07 MED ORDER — FENTANYL CITRATE PF 50 MCG/ML IJ SOSY
PREFILLED_SYRINGE | INTRAMUSCULAR | Status: AC
  Administered 2022-11-07: 100 ug via INTRAVENOUS
  Filled 2022-11-07: qty 2

## 2022-11-07 MED ORDER — NITROGLYCERIN IN D5W 200-5 MCG/ML-% IV SOLN
0.0000 ug/min | INTRAVENOUS | Status: DC
  Administered 2022-11-07: 200 ug/min via INTRAVENOUS
  Administered 2022-11-07: 5 ug/min via INTRAVENOUS
  Administered 2022-11-08: 185 ug/min via INTRAVENOUS
  Filled 2022-11-07 (×2): qty 250
  Filled 2022-11-07: qty 500
  Filled 2022-11-07: qty 250

## 2022-11-07 MED ORDER — DEXTROSE 50 % IV SOLN: 0.0000 mL | INTRAVENOUS | Status: DC | PRN

## 2022-11-07 MED ORDER — ROCURONIUM BROMIDE 10 MG/ML (PF) SYRINGE
PREFILLED_SYRINGE | INTRAVENOUS | Status: DC | PRN
  Administered 2022-11-07: 50 mg via INTRAVENOUS
  Administered 2022-11-07: 20 mg via INTRAVENOUS

## 2022-11-07 MED ORDER — MIDAZOLAM HCL 2 MG/2ML IJ SOLN
INTRAMUSCULAR | Status: AC
  Filled 2022-11-07: qty 2

## 2022-11-07 MED ORDER — PROPOFOL 500 MG/50ML IV EMUL
INTRAVENOUS | Status: DC | PRN
  Administered 2022-11-07: 50 ug/kg/min via INTRAVENOUS

## 2022-11-07 MED ORDER — HYDRALAZINE HCL 20 MG/ML IJ SOLN: 5.0000 mg | INTRAMUSCULAR | Status: DC | PRN

## 2022-11-07 MED ORDER — INSULIN ASPART 100 UNIT/ML IJ SOLN
0.0000 [IU] | INTRAMUSCULAR | Status: DC
  Administered 2022-11-07: 4 [IU] via SUBCUTANEOUS

## 2022-11-07 MED ORDER — FENTANYL CITRATE PF 50 MCG/ML IJ SOSY: 50.0000 ug | PREFILLED_SYRINGE | INTRAMUSCULAR | Status: DC | PRN

## 2022-11-07 MED ORDER — POLYETHYLENE GLYCOL 3350 17 G PO PACK
17.0000 g | PACK | Freq: Every day | ORAL | Status: DC
  Administered 2022-11-07 – 2022-11-09 (×3): 17 g
  Filled 2022-11-07 (×3): qty 1

## 2022-11-07 MED ORDER — PROPOFOL 10 MG/ML IV BOLUS
INTRAVENOUS | Status: DC | PRN
  Administered 2022-11-07 (×2): 30 mg via INTRAVENOUS
  Administered 2022-11-07: 20 mg via INTRAVENOUS
  Administered 2022-11-07: 40 mg via INTRAVENOUS
  Administered 2022-11-07: 20 mg via INTRAVENOUS

## 2022-11-07 MED ORDER — LABETALOL HCL 5 MG/ML IV SOLN
10.0000 mg | INTRAVENOUS | Status: DC | PRN
  Administered 2022-11-07 – 2022-11-08 (×5): 10 mg via INTRAVENOUS
  Filled 2022-11-07 (×4): qty 4

## 2022-11-07 MED ORDER — OXYCODONE-ACETAMINOPHEN 7.5-325 MG PO TABS
1.0000 | ORAL_TABLET | Freq: Four times a day (QID) | ORAL | Status: DC
  Administered 2022-11-07 – 2022-11-09 (×8): 1
  Filled 2022-11-07 (×7): qty 1

## 2022-11-07 MED ORDER — PROPOFOL 1000 MG/100ML IV EMUL
INTRAVENOUS | Status: AC
  Filled 2022-11-07: qty 100

## 2022-11-07 MED ORDER — FENTANYL CITRATE PF 50 MCG/ML IJ SOSY
50.0000 ug | PREFILLED_SYRINGE | INTRAMUSCULAR | Status: DC | PRN
  Administered 2022-11-07 (×2): 100 ug via INTRAVENOUS
  Administered 2022-11-07: 200 ug via INTRAVENOUS
  Administered 2022-11-07: 100 ug via INTRAVENOUS
  Administered 2022-11-07: 50 ug via INTRAVENOUS
  Administered 2022-11-08 – 2022-11-09 (×20): 100 ug via INTRAVENOUS
  Administered 2022-11-09: 50 ug via INTRAVENOUS
  Administered 2022-11-10: 100 ug via INTRAVENOUS
  Filled 2022-11-07 (×3): qty 2
  Filled 2022-11-07: qty 1
  Filled 2022-11-07 (×5): qty 2
  Filled 2022-11-07: qty 4
  Filled 2022-11-07 (×4): qty 2

## 2022-11-07 MED ORDER — LABETALOL HCL 5 MG/ML IV SOLN
10.0000 mg | Freq: Once | INTRAVENOUS | Status: DC
  Filled 2022-11-07: qty 4

## 2022-11-07 MED ORDER — ONDANSETRON HCL 4 MG/2ML IJ SOLN
INTRAMUSCULAR | Status: DC | PRN
  Administered 2022-11-07: 4 mg via INTRAVENOUS

## 2022-11-07 MED ORDER — SODIUM CHLORIDE 0.9 % IV SOLN
3.0000 g | Freq: Four times a day (QID) | INTRAVENOUS | Status: DC
  Administered 2022-11-07 – 2022-11-10 (×11): 3 g via INTRAVENOUS
  Filled 2022-11-07 (×11): qty 8

## 2022-11-07 MED ORDER — MIDAZOLAM HCL 2 MG/2ML IJ SOLN
INTRAMUSCULAR | Status: DC | PRN
  Administered 2022-11-07: 2 mg via INTRAVENOUS

## 2022-11-07 SURGICAL SUPPLY — 33 items
BNDG GAUZE DERMACEA FLUFF 4 (GAUZE/BANDAGES/DRESSINGS) IMPLANT
BNDG GZE DERMACEA 4 6PLY (GAUZE/BANDAGES/DRESSINGS) ×4
CANISTER SUCT 3000ML PPV (MISCELLANEOUS) ×3 IMPLANT
COVER SURGICAL LIGHT HANDLE (MISCELLANEOUS) ×3 IMPLANT
DRAPE LAPAROSCOPIC ABDOMINAL (DRAPES) IMPLANT
DRAPE UTILITY XL STRL (DRAPES) ×6 IMPLANT
ELECT CAUTERY BLADE 6.4 (BLADE) ×3 IMPLANT
ELECT REM PT RETURN 9FT ADLT (ELECTROSURGICAL) ×2
ELECTRODE REM PT RTRN 9FT ADLT (ELECTROSURGICAL) ×3 IMPLANT
GAUZE PAD ABD 8X10 STRL (GAUZE/BANDAGES/DRESSINGS) ×3 IMPLANT
GAUZE SPONGE 4X4 12PLY STRL (GAUZE/BANDAGES/DRESSINGS) ×3 IMPLANT
GLOVE SURG SIGNA 7.5 PF LTX (GLOVE) ×3 IMPLANT
GOWN STRL REUS W/ TWL LRG LVL3 (GOWN DISPOSABLE) ×3 IMPLANT
GOWN STRL REUS W/ TWL XL LVL3 (GOWN DISPOSABLE) ×3 IMPLANT
GOWN STRL REUS W/TWL LRG LVL3 (GOWN DISPOSABLE) ×2
GOWN STRL REUS W/TWL XL LVL3 (GOWN DISPOSABLE) ×2
KIT BASIN OR (CUSTOM PROCEDURE TRAY) ×3 IMPLANT
KIT TURNOVER KIT B (KITS) ×3 IMPLANT
NS IRRIG 1000ML POUR BTL (IV SOLUTION) ×3 IMPLANT
PACK GENERAL/GYN (CUSTOM PROCEDURE TRAY) IMPLANT
PACK LITHOTOMY IV (CUSTOM PROCEDURE TRAY) ×3 IMPLANT
PACKING GAUZE IODOFORM 1INX5YD (GAUZE/BANDAGES/DRESSINGS) IMPLANT
PAD ARMBOARD 7.5X6 YLW CONV (MISCELLANEOUS) ×3 IMPLANT
PENCIL BUTTON HOLSTER BLD 10FT (ELECTRODE) ×3 IMPLANT
SPONGE T-LAP 18X18 ~~LOC~~+RFID (SPONGE) ×3 IMPLANT
SWAB COLLECTION DEVICE MRSA (MISCELLANEOUS) IMPLANT
SWAB CULTURE ESWAB REG 1ML (MISCELLANEOUS) IMPLANT
SYR BULB EAR ULCER 3OZ GRN STR (SYRINGE) ×3 IMPLANT
TOWEL GREEN STERILE (TOWEL DISPOSABLE) ×3 IMPLANT
TOWEL GREEN STERILE FF (TOWEL DISPOSABLE) ×3 IMPLANT
TUBE CONNECTING 12X1/4 (SUCTIONS) ×3 IMPLANT
UNDERPAD 30X36 HEAVY ABSORB (UNDERPADS AND DIAPERS) ×3 IMPLANT
YANKAUER SUCT BULB TIP NO VENT (SUCTIONS) ×3 IMPLANT

## 2022-11-07 NOTE — Progress Notes (Signed)
While turning/repositioning pt it was noted that pt was leaking urine around foley. Attempted to flush foley, however unable to flush foley. Decision was made to replace foley.  While preforming perineal care it was noted that perineal area had thick tan colored discharge. Pt noted to be in pain despite PRN Fentanyl dose. Pt given PRN Versed and extra dose of Fentanyl (per order) so that proper perineal care could be performed. Multiple perineal wipe packs used to clean perineal area before foley insertion.  New foley inserted with sterile technique with immediate return of urine.   Foley inserted by Allegra Grana, RN, with assistance from [place] Bailey, [place], Erma Heritage, RN and Rick Duff, RN

## 2022-11-07 NOTE — Transfer of Care (Signed)
Immediate Anesthesia Transfer of Care Note  Patient: Lindsey [name]  Procedure(s) Performed: IRRIGATION AND EXCISIONAL DEBRIDEMENT PERINEAL WOUND (Vagina ) DEBRIDEMENT  BUTTOCK WOUND AND DRESSING CHANGE UNDER ANESTHESIA (Buttocks)  Patient Location: ICU  Anesthesia Type:General  Level of Consciousness: Patient remains intubated per anesthesia plan  Airway & Oxygen Therapy: Patient placed on Ventilator (see vital sign flow sheet for setting)  Post-op Assessment: Report given to RN and Post -op Vital signs reviewed and stable  Post vital signs: Reviewed and stable  Last Vitals:  Vitals Value Taken Time  BP 113/81 - (NIBP) - 188/47 (aline)   Temp    Pulse 79   Resp    SpO2 100 % 11/18/2022 1206    Last Pain:  Vitals:   10/31/2022 0750  TempSrc: Axillary  PainSc:          Complications: No notable events documented.

## 2022-11-07 NOTE — Op Note (Signed)
   Lindsey [name] 11/10/2022   Pre-op Diagnosis: Necrotizing fasciitis     Post-op Diagnosis: Necrotic fasciitis  Procedure(s): Excisional debridement and irrigation of perineal and back wound  Surgeon(s): Coralie Keens, MD  Anesthesia: General  Staff:  Circulator: Lindwood Coke, RN; Hal Morales, RN Scrub Person: Thora Lance Circulator Assistant: Rometta Emery, RN  Estimated Blood Loss: Minimal               Indications: This is a 37 year old female who we are returning to the operating room today for further debridement of her necrotizing soft tissue infection.  This is her fourth trip to the operating room.  Findings: Most of the wounds were improving.  Minimal piecemeal debridement of strands of necrotic tissue were excised with the cautery.  This is only several centimeters of tissue in both the perineum near the right groin and along the back incision.  The infection did not appear to be extending any further.  Procedure: The patient is brought to the operating identified the correct patient.  She is placed upon the operating room table after coming from the intensive care unit already intubated.  She was then placed in lithotomy position.  General anesthesia was induced.  I removed the packing from the wound and the peritoneum going up to the groin.  Most of the wound itself was fairly clean.  There was some mild purulence to up toward the groin but the infection did not seem to track into the abdominal wall.  All he had to excise about 2 to 3 cm of tissue with electrocautery.  This included the subcutaneous tissue and fat.  We then repacked the wound wet-to-dry saline soaked gauze.  The patient was then turned into a prone position on the another operating room table.  Remove the packing from her large back wound.  There was just mild strands of necrotic fat which were easily excised with the cautery.  I had debrided approximately 2 to 3 cm of skin on the right side of  the wound in 2 to 3 cm of fat in the uppermost portion of the wound going up to the left side.  I then repacked this wound with 2 separate wet-to-dry saline soaked gauze.  I thoroughly irrigated both wounds before repacking.  Gauze and ABDs were placed over all the wounds.  The patient was then turned back into the supine position.  She was then left intubated and taken back to the intensive care unit.  All counts were correct at the end of the procedure          Coralie Keens   Date: 11/14/2022  Time: 11:46 AM

## 2022-11-07 NOTE — Progress Notes (Signed)
Nutrition Follow-up  DOCUMENTATION CODES:   Morbid obesity  INTERVENTION:  Advance tube feeding from trickles to goal rate via Cortrak tube: Vital 1.5 @ 44mL/h (1.8L/d) Start at 11ml and then advance by 53mL q6h to goal of 75 Prosource TF 20 BID; can decrease to once daily once tube feeding at goal rate This will provide 2780kcal, 142g of protein, and 1395mL of free water. 1 packet Juven BID, each packet provides 95 calories, 2.5 grams of protein (collagen), and 9.8 grams of carbohydrate (3 grams sugar); also contains 7 grams of L-arginine and L-glutamine, 300 mg vitamin C, 15 mg vitamin E, 1.2 mcg vitamin B-12, 9.5 mg zinc, 200 mg calcium, and 1.5 g  Calcium Beta-hydroxy-Beta-methylbutyrate to support wound healing Vitamin C 250mg   daily, Zinc 220mg  x14 days for wound healing  NUTRITION DIAGNOSIS:  Increased nutrient needs related to wound healing as evidenced by estimated needs. -Ongoing  GOAL:  Provide needs based on ASPEN/SCCM guidelines -progressing, TF to be advanced  MONITOR:  Vent status, Labs, I & O's, Skin, TF tolerance  REASON FOR ASSESSMENT:  Ventilator    ASSESSMENT:  Pt with hx of DM type 2, HTN, HLD and hypothyroidism presented to ED with worsening pain from known boils on her buttocks. Imaging in ED showed large gas containing infection of the right gluteus.  1/10 - Op, I&D, returned to OR on vent 1/11 - Op, I&D    1/15 - Op I&D, Cortrak placed (tip in gastric antrum)  Patient is currently intubated on ventilator support MV: 14 L/min Temp (24hrs), Avg:97.5 F (36.4 C), Min:97 F (36.1 C), Max:97.9 F (36.6 C)  CT head negative for acute intracranial abnormality. Mentation improving. CCM keeping pt intubated until she no longer needs surgical debridements.    Patient has continued with tube feeding at trickle rate throughout the weekend d/t elevated blood sugar and plans for repeat I&D's.   Noted plans to switch back to insulin drip given ongoing  hyperglycemia. Discussed with CCM advancing TF to help support wound healing. CCM in agreement.    Intake/Output Summary (Last 24 hours) at 11/04/2022 1318 Last data filed at 11/08/2022 1217 Gross per 24 hour  Intake 1327.07 ml  Output 4050 ml  Net -2722.93 ml   Net IO Since Admission: 17,743.53 mL [11/08/2022 1319]  Medications: vitamin C 250mg  daily, colace, protonix, miralax, senna, zinc (1/10-1/24)  Labs: sodium 147, BUN 66, Cr 1.19, Mg 3.0, CBG's 198-349 x24 hours  Diet Order:   Diet Order             Diet NPO time specified  Diet effective now                  EDUCATION NEEDS:  Not appropriate for education at this time  Skin:  Skin Assessment: Reviewed RN Assessment (fournier's gangrene of perineum and buttocks)  Last BM:  1/15 via FMS  Height:  Ht Readings from Last 1 Encounters:  10/30/2022 5\' 6"  (1.676 m)   Weight:  Wt Readings from Last 1 Encounters:  11/07/22 (!) 217 kg   Ideal Body Weight:  59.1 kg  BMI:  Body mass index is 77.22 kg/m.  Estimated Nutritional Needs:  Kcal:  2700-3000 kcal/d Protein:  135-150 g/d Fluid:  2.2-2.5L/d  Clayborne Dana, RDN, LDN Clinical Nutrition

## 2022-11-07 NOTE — Progress Notes (Addendum)
Inpatient Diabetes Program Recommendations  AACE/ADA: New Consensus Statement on Inpatient Glycemic Control (2015)  Target Ranges:  Prepandial:   less than 140 mg/dL      Peak postprandial:   less than 180 mg/dL (1-2 hours)      Critically ill patients:  140 - 180 mg/dL   Lab Results  Component Value Date   GLUCAP 305 (H) 11/06/2022   HGBA1C 14.7 (H) 10/25/2022    Review of Glycemic Control  Latest Reference Range & Units 11/06/22 23:24 11/10/2022 03:26 11/20/2022 07:46  Glucose-Capillary 70 - 99 mg/dL 335 (H) 349 (H) 305 (H)   Diabetes history: DM Outpatient Diabetes medications: Actos 45 mg daily, Amaryl 2 mg daily  Current orders for Inpatient glycemic control:  Vital at 25 ml/hr Novolog 13 units q 4 hours Semglee 60 units q 4 hours  Inpatient Diabetes Program Recommendations:    Blood sugars >goal. Note changes in SQ insulin regimen.  If blood sugars remain >goal, consider restart of IV insulin per phase 2 of ICU order set.   Thanks,  Adah Perl, RN, BC-ADM Inpatient Diabetes Coordinator Pager [phone]  (8a-5p)

## 2022-11-07 NOTE — TOC Progression Note (Signed)
Transition of Care [facility]) - Progression Note    Patient Details  Name: Lindsey [name] MRN: [id number] Date of Birth: April 14, 1986  Transition of Care [facility]) CM/SW Contact  Bartholomew Crews, RN Phone Number: [phone] 11/01/2022, 2:45 PM  Clinical Narrative:     Financial counselor unable to talk to patient d/t current medical state. Financial counselor to create letter of incapacity requiring medical provider signature, which will allow financial counselor to work with patient's mom on her behalf.        Expected Discharge Plan and Services                                               Social Determinants of Health (SDOH) Interventions SDOH Screenings   Depression (PHQ2-9): High Risk (03/14/2022)  Tobacco Use: Medium Risk (11/05/2022)    Readmission Risk Interventions     No data to display

## 2022-11-07 NOTE — Procedures (Signed)
Cortrak  Person Inserting Tube:  Maylon Peppers C, RD Tube Type:  Cortrak - 43 inches Tube Size:  10 Tube Location:  Left nare Secured by: Bridle Technique Used to Measure Tube Placement:  Marking at nare/corner of mouth Cortrak Secured At:  88 cm   Cortrak Tube Team Note:  Consult received to place a Cortrak feeding tube.   X-ray is required, abdominal x-ray has been ordered by the Cortrak team. Please confirm tube placement before using the Cortrak tube.   If the tube becomes dislodged please keep the tube and contact the Cortrak team at [email or web address] for replacement.  If after hours and replacement cannot be delayed, place a NG tube and confirm placement with an abdominal x-ray.    Lockie Pares., RD, LDN, CNSC See AMiON for contact information

## 2022-11-07 NOTE — Progress Notes (Signed)
Pharmacy ICU Insulin Management  CBGs above goal 140-180 mg/dL: Yes  Current regimen (include units of SSI in last 24hr): - Semglee 50u BID - Novolog 10u Q4H  - Resistant SSI  Medications affecting CBG levels: TF - TF remain at trickle (25 mL/hr) given intermittent OR procedures  Plan per MD: - Continue Semglee 60u BID - Increase Novolog TF coverage to 30u Q4H - Continue resistant SSI  Pharmacy recommending to re-start IV insulin drip. Provider to continue with subcutaneous insulin. Pharmacy will sign-off at this time. Pharmacy will be available for assistance.   Jeneen Rinks, Pharm.D PGY1 Pharmacy Resident 11/18/2022 9:09 AM

## 2022-11-07 NOTE — Progress Notes (Signed)
Patient ID: Lindsey [name], female   DOB: April 06, 1986, 37 y.o.   MRN: [id number]   Pre Procedure note for inpatients:   Lindsey [name] has been scheduled for Procedure(s): EXCISIONAL DEBRIDEMENT PERINEAL WOUND (N/A) today. The various methods of treatment have been discussed with the patient. After consideration of the risks, benefits and treatment options the patient has consented to the planned procedure.   The patient has been seen and labs reviewed. There are no changes in the patient's condition to prevent proceeding with the planned procedure today.  CT of the head yesterday showed no acute process.  She remains on the vent but is now following some commands  I discussed her for CCM and will proceed to the OR today for dressing changes and assess for the need for further debridement  Recent labs:  Lab Results  Component Value Date   WBC 34.9 (H) 04-Dec-2022   HGB 14.3 2022-12-04   HCT 42.0 04-Dec-2022   PLT 204 12-04-22   GLUCOSE 333 (H) Dec 04, 2022   TRIG 665 (H) 11/04/2022   ALT 19 11/06/2022   AST 19 11/06/2022   NA 147 (H) December 04, 2022   K 3.9 December 04, 2022   CL 105 12/04/22   CREATININE 1.19 (H) 12/04/22   BUN 66 (H) 04-Dec-2022   CO2 24 2022-12-04   INR 1.09 07/03/2016   HGBA1C 14.7 (H) 10/27/2022    Coralie Keens, MD 12/04/22 10:00 AM

## 2022-11-07 NOTE — Progress Notes (Signed)
Patient transported from 2M05 to OR 1 on ventilator with no complications noted.

## 2022-11-07 NOTE — Progress Notes (Incomplete)
Attending note: I have seen and examined the patient. History, labs and imaging reviewed.  37 Y/O with Fournier gangrene, HHS, afib with RVR Remains on vent Off levophed since last night  Blood pressure 131/60, pulse 75, temperature (!) 97.5 F (36.4 C), temperature source Axillary, resp. rate (!) 25, height 5\' 6"  (1.676 m), weight (!) 217 kg, SpO2 100 %. Gen:      No acute distress HEENT:  EOMI, sclera anicteric Neck:     No masses; no thyromegaly Lungs:    Clear to auscultation bilaterally; normal respiratory effort*** CV:         Regular rate and rhythm; no murmurs Abd:      + bowel sounds; soft, non-tender; no palpable masses, no distension Ext:    No edema; adequate peripheral perfusion Skin:      Warm and dry; no rash Neuro: alert and oriented x 3 Psych: normal mood and affect   Labs/Imaging personally reviewed, significant for WBC 34, Hb 8.1   Assessment/plan: Fournier gangrene Strep mitis,oralis in wound culture Can narrow to unasyn Off pressors now Discussed with surgery, will go back to OR today  Hyperglycemia Continue insulin. May need need drip if sugars are elevated  The patient is critically ill with multiple organ systems failure and requires high complexity decision making for assessment and support, frequent evaluation and titration of therapies, application of advanced monitoring technologies and extensive interpretation of multiple databases.  Critical care time - 35 mins. This represents my time independent of the NPs time taking care of the pt.  Marshell Garfinkel MD [facility] 11/23/2022, 10:04 AM

## 2022-11-07 NOTE — Anesthesia Preprocedure Evaluation (Signed)
Anesthesia Evaluation  Patient identified by MRN, date of birth, ID band Patient awake    Reviewed: Allergy & Precautions, H&P , NPO status , Patient's Chart, lab work & pertinent test results  Airway Mallampati: Intubated       Dental no notable dental hx. (+) Teeth Intact, Dental Advisory Given   Pulmonary neg pulmonary ROS, former smoker   Pulmonary exam normal breath sounds clear to auscultation       Cardiovascular hypertension, Pt. on medications and Pt. on home beta blockers  Rhythm:Regular Rate:Tachycardia     Neuro/Psych  Headaches  Anxiety Depression       GI/Hepatic negative GI ROS, Neg liver ROS,,,  Endo/Other  diabetes, Type 2, Oral Hypoglycemic AgentsHypothyroidism  Morbid obesity  Renal/GU negative Renal ROS  negative genitourinary   Musculoskeletal   Abdominal   Peds  Hematology negative hematology ROS (+)   Anesthesia Other Findings   Reproductive/Obstetrics negative OB ROS                             Anesthesia Physical Anesthesia Plan  ASA: 4  Anesthesia Plan: General   Post-op Pain Management: Ofirmev IV (intra-op)*   Induction: Intravenous  PONV Risk Score and Plan: 3 and Ondansetron, Midazolam and Dexamethasone  Airway Management Planned: Oral ETT  Additional Equipment: Arterial line  Intra-op Plan:   Post-operative Plan: Post-operative intubation/ventilation  Informed Consent: I have reviewed the patients History and Physical, chart, labs and discussed the procedure including the risks, benefits and alternatives for the proposed anesthesia with the patient or authorized representative who has indicated his/her understanding and acceptance.     Dental advisory given  Plan Discussed with: CRNA  Anesthesia Plan Comments:        Anesthesia Quick Evaluation

## 2022-11-07 NOTE — Progress Notes (Signed)
[place] Progress Note Patient Name: Marlow Hendrie DOB: 13-Sep-1986 MRN: [id number]   Date of Service  2022-11-18  HPI/Events of Note  Patient nods to pain question with extensive I&D of perineum. Has Fentanyl drip and bolus' on [facility], however trying to avoid restarting drip per report. BSRN asking for Fent PRN separate from drip.  eICU Interventions  Fentanyl prn pushes 25-50 mcg ordered     Intervention Category Intermediate Interventions: Pain - evaluation and management  [name] November 18, 2022, 4:04 AM

## 2022-11-07 NOTE — Progress Notes (Signed)
eLink Physician-Brief Progress Note Patient Name: Lindsey [name] DOB: Mar 27, 1986 MRN: [id number]   Date of Service  11/12/2022  HPI/Events of Note  Tachycardia seems to have improved with the albumin K 3.6, H/H 8.4/26.5  eICU Interventions  Give potassium 40 meqs per OGT Ordered another dose of albumin 25% 12.5 g Discussed with BSRN     Intervention Category Intermediate Interventions: Electrolyte abnormality - evaluation and management;Arrhythmia - evaluation and management  [name] 11/08/2022, 2:13 AM

## 2022-11-07 NOTE — Anesthesia Postprocedure Evaluation (Signed)
Anesthesia Post Note  Patient: Laurenashley Viar  Procedure(s) Performed: IRRIGATION AND EXCISIONAL DEBRIDEMENT PERINEAL WOUND (Vagina ) DEBRIDEMENT  BUTTOCK WOUND AND DRESSING CHANGE UNDER ANESTHESIA (Buttocks)     Patient location during evaluation: SICU Anesthesia Type: General Level of consciousness: sedated Pain management: pain level controlled Vital Signs Assessment: post-procedure vital signs reviewed and stable Respiratory status: patient remains intubated per anesthesia plan Cardiovascular status: stable Postop Assessment: no apparent nausea or vomiting Anesthetic complications: no  No notable events documented.  Last Vitals:  Vitals:   11/04/2022 1300 11/23/2022 1345  BP: (!) 145/76 (!) 202/60  Pulse: 64   Resp: (!) 0   Temp:    SpO2: 100%     Last Pain:  Vitals:   11/21/2022 0750  TempSrc: Axillary  PainSc:                  Kaydn Kumpf,W. EDMOND

## 2022-11-07 NOTE — Progress Notes (Addendum)
Patient arrived back from OR at 12:20. Blood pressures 656C systolic. Per CRNA paralytic not reversed. PRN fentanyl and versed given without relief. MD notified. PRN hydralazine ordered. Labetalol given, Nitro gtt started.

## 2022-11-07 NOTE — Progress Notes (Signed)
RT at bedside for afternoon rounds. Pt is currently in OR at this time.

## 2022-11-07 NOTE — Progress Notes (Signed)
Patient systolic blood pressure has been in the 190-200s after return from surgery.  Her blood pressure is measured from the A-line.  Patient received total of 400 mg of fentanyl and 8 mg of Versed in the last hour without any effect.  She appears comfortable on exam and satting well on current setting of ventilator.  She also received 1 dose of IV hydralazine 5 mg and IV labetalol 10 mg which only lowered her SBP to 180 for 30 min.   I wonder if this is her true blood pressure, measured from the A-line.  Her previous blood pressure was measured on her leg.  Patient does have history of hypertension on irbesartan at home which she was not taking.  So far we ruled out pain and agitation as the cause of her elevated blood pressure.  Will trial 1 dose of IV Lasix 40 mg for pulmonary edema.  Also start her on nitro drip to temporarily lower her blood pressure with goal SBP ~ 180.  Avoid Cleviprex given triglyceride of 665.

## 2022-11-07 NOTE — Progress Notes (Signed)
eLink Physician-Brief Progress Note Patient Name: Lindsey [name] DOB: 06/17/1986 MRN: [id number]   Date of Service  11/14/2022  HPI/Events of Note  Patient having bursts of SVT rate 160-180, BP stable BP 117/95 with baseline HR in the 80s to 90s CBC and BMP pending, transfused 2 units yesterday Appears to be afib Albumin this morning <1.5 CVP to 13-15 in a patient on positive pressure ventilation and BMI 78  eICU Interventions  Add on Mg and phos Albumin 25% 12.5 ordered eLink to be informed once labs resulted Discussed bedside RN     Intervention Category Intermediate Interventions: Arrhythmia - evaluation and management  [name] 11/21/2022, 12:48 AM

## 2022-11-07 NOTE — Progress Notes (Signed)
eLink Physician-Brief Progress Note Patient Name: Lindsey [name] DOB: 08-20-1986 MRN: [id number]   Date of Service  11/20/2022  HPI/Events of Note  Attempt to replace foley d/t malfunction, Pt perineum extremely excoriated and painful. BSRN asking for an additional Fent dose. Got 50 mcg at 0422 ordered for Q2  Seen being turned and changed  eICU Interventions  One time dose of Fentanyl 50 mcg ordered     Intervention Category Intermediate Interventions: Pain - evaluation and management  Shona Needles Muna Demers 11/12/2022, 5:03 AM

## 2022-11-07 NOTE — Progress Notes (Signed)
NAME:  Lindsey [name], MRN:  [id number], DOB:  [birth year], LOS: 6 ADMISSION DATE:  11/02/2022, CONSULTATION DATE:  11/09/2022 REFERRING MD:  Dr. Alvino Chapel , CHIEF COMPLAINT:  HHS   History of Present Illness:  Lindsey [name] is a 37 y.o. female who has a PMH as below including DM2. She presented to [facility] 1/9 with fatigue, dyspnea, buttock wounds that had been getting worse. First noticed buttock wound around thanksgiving but it healed on its own. Then recurred around new years and has persisted and worsened to the point of malodorous drainage. She has also had fatigue and dyspnea. She apparently had been eating badly around the holidays and her CBG's had been reading high. Normally she states that CBG's are well controlled.   In ED, she was found to have HHS. 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   While at [facility], she also developed A.Fib RVR for which she received Amiodarone bolus x 2 followed by infusion. She was given one dose of Flagyl and was transferred to [facility] ED for surgical evaluation. En route to [facility], she converted to NSR. BP has been stable since ED presentation.   She was evaluated by surgery and is being taken to the OR for debridement tonight. She is currently on Insulin infusion, Amiodarone infusion, potassium supplementation. Her mother is with her at the bedside.  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   Significant Hospital Events: Including procedures, antibiotic start and stop dates in addition to other pertinent events   CT abdomen pelvis 1/9 > basilar atelectasis, normal stomach, appendix, bowel wall, no hernia, mild to moderate subcutaneous impaired muscular inflammatory fat stranding anterolateral aspect abdominal wall on the left, no ascites.  Soft tissue gas along the perineum on the right with medial and posterior medial right gluteal involvement consistent with Fournier's gangrene OR for debridement 1/9, 1/11, 1/13 Blood cultures 1/9 >  Urine culture 1/9 > yeast 70 K Right buttock wound culture 1/10 > GPC, GNR > abundant group B strep, abundant strep mitis PICC placed 1/12  Interim History / Subjective:  Patient is intubated.  She is intermittently follow commands.  She opens her eyes to verbal stimulation intermittently.  Objective   Blood pressure 131/60, pulse 75, temperature (!) 97.5 F (36.4 C), temperature source Axillary, resp. rate (!) 25, height 5\' 6"  (1.676 m), weight (!) 217 kg, SpO2 100 %. CVP:  [8 mmHg-21 mmHg] 16 mmHg  Vent Mode: PRVC FiO2 (%):  [40 %] 40 % Set Rate:  [28 bmp] 28 bmp Vt Set:  [480 mL] 480 mL PEEP:  [10 cmH20] 10 cmH20 Plateau Pressure:  [19 cmH20-31 cmH20] 22 cmH20   Intake/Output Summary (Last 24 hours) at 10/28/2022 0919 Last data filed at 11/20/2022 0800 Gross per 24 hour  Intake 999.5 ml  Output 3940 ml  Net -2940.5 ml   Filed Weights   10/30/2022 0500 11/06/22 0500 11/20/2022 0507  Weight: (!) 218.4 kg (!) 217.6 kg (!) 217 kg  Examination: Physical Exam Constitutional:      Appearance: She is ill-appearing.     Comments: Somnolent but alert to verbal stimulation intermittently.  HENT:     Head: Normocephalic.  Eyes:     General:        Right eye: No discharge.        Left eye: No discharge.  Cardiovascular:     Rate and Rhythm: Normal  rate and regular rhythm.  Pulmonary:     Effort: No respiratory distress.     Comments: Intubated Abdominal:     Palpations: Abdomen is soft.  Musculoskeletal:     Cervical back: Normal range of motion.  Skin:    General: Skin is warm.  Neurological:     Mental Status: She is disoriented.      Resolved Hospital Problem list   HHS Septic shock  Assessment & Plan:  HHS -resolved Type 2 diabetes -Off insulin drip as of 1/12 -A1C14.7 -Home glimepiride and pioglitazone are on hold -Fasting CBG this morning 307.  Will need tighter glucose control for wound healing. 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   Wound culture with group B strep, nontoxin producing, strep mitis -Narrow down antibiotics from Zosyn to Unasyn -Greatly appreciate surgery management.  S/p debridement on November 21, 2022, 1/11, 1/13. Going back today   Acute metabolic encephalopathy  Initially due to HHS, acute infection with septic shock.  CT head was negative for any acute intracranial abnormality.  Her mentation is improving.  She can follow commands and open eyes.  I think the driving force right now is her severe infection and Fournier gangrene. -Continue holding all sedation.  Septic shock due to Fournier's gangrene-resolved Patient is off of Levophed -Adequately volume resuscitated.  Not really in a position hemodynamically to initiate diuresis   Acute respiratory failure due to acute encephalopathy from infection 2/2 for near gangrene, also possible contribution of evolving bilateral pulmonary infiltrates with total body volume overload, pulmonary edema -Continue PRVC 8 cc/kg -Diffuse pulmonary edema since on chest x-ray.  Since she is n.p.o. and going for surgery today, will not start diuretics.  -Will keep patient intubated until she is no longer needs surgical debridement   Hx  Hypothyroidism. -Continue Home levothyroxine dose 75 mcg daily   Hx Anxiety, Depression, Panic disorder, PTSD. -Appreciate pharmacy assistance, clarified home regimen.  She is no longer on most of her outpatient meds, only on quetiapine at night.  Holding for now   Idiopathic intracranial hypertension -Has been managed with Diamox as an outpatient, continue to hold for now  Best Practice (right click and "Reselect all SmartList Selections" daily)   Diet/type: NPO DVT prophylaxis: prophylactic heparin  GI prophylaxis: PPI Lines: Central line Foley:  Yes, and it is still needed Code Status:  full code Last date of multidisciplinary goals of care discussion [today]  Labs   CBC: Recent Labs  Lab 11-21-2022 1811 Nov 21, 2022 2024 11/19/2022 0313 10/28/2022 1014 11/04/22 0505 11/04/22 0836 10/31/2022 2236 11/06/22 1931 11/19/2022 0045 11/22/2022 0328 11/23/2022 0915  WBC 22.8*   < > 23.3*  --  27.9*  --  25.2*  --  35.9* 34.9*  --   NEUTROABS 18.4*  --   --   --   --   --   --   --   --   --   --   HGB 11.3*   < >  8.7*   < > 7.6*   < > 6.2* 7.8* 8.4* 8.1* 14.3  HCT 34.0*   < > 25.6*   < > 22.7*   < > 18.4* 23.6* 26.5* 25.8* 42.0  MCV 83.1   < > 83.4  --  83.8  --  85.6  --  89.8 90.2  --   PLT 240   < > 200  --  217  --  166  --  187 204  --    < > = values in this interval not displayed.    Basic Metabolic Panel: Recent Labs  Lab 11/14/2022 0313 11/17/2022 1014 11/04/22 0505 11/04/22 0836 10/27/2022 0344 10/26/2022 1814 11/17/2022 1836 11/06/22 0050 10/24/2022 0045 11/22/2022 0328 11/04/2022 0915  NA  --    < > 132*   < > 134* 134* 137 136 139 141 147*  K  --    < > 3.2*   < > 4.3 4.5 5.0 4.9 3.6 3.8 3.9  CL  --    < > 97*   < > 99 101  --  105 107 105  --   CO2  --    < > 24   < > 23 23  --  23 23 24   --   GLUCOSE  --    < > 148*   < > 207* 240*  --  222* 335* 333*  --   BUN  --    < > 45*   < > 51* 61*  --  65* 71* 66*  --   CREATININE  --    < > 1.13*   < > 1.41* 1.32*  --  1.44* 1.19*  1.19*  --   CALCIUM  --    < > 6.5*   < > 7.0* 7.2*  --  7.5* 7.6* 7.6*  --   MG 2.7*  --  2.6*  --  2.9* 3.0*  --   --  2.9* 3.0*  --   PHOS 3.9  --  2.4*  --   --   --   --   --  3.6 3.6  --    < > = values in this interval not displayed.   GFR: Estimated Creatinine Clearance: 126.3 mL/min (A) (by C-G formula based on SCr of 1.19 mg/dL (H)). Recent Labs  Lab 11/13/2022 1811 11/06/2022 1942 11/02/22 0500 11/02/22 0930 10/30/2022 0313 11/11/2022 0315 11/04/22 0505 11/04/2022 2236 11/22/2022 0045 10/31/2022 0328  WBC 22.8*  --    < >  --    < >  --  27.9* 25.2* 35.9* 34.9*  LATICACIDVEN 3.5* 2.9*  --  1.6  --  1.0  --   --   --   --    < > = values in this interval not displayed.    Liver Function Tests: Recent Labs  Lab 11/20/2022 0000 11/06/22 0050  AST 29 19  ALT 26 19  ALKPHOS 85 83  BILITOT 0.9 1.4*  PROT 5.7* 5.7*  ALBUMIN 1.5* <1.5*   No results for input(s): "LIPASE", "AMYLASE" in the last 168 hours. No results for input(s): "AMMONIA" in the last 168 hours.  ABG    Component Value Date/Time   PHART 7.343 (L) 10/25/2022 0915   PCO2ART 50.0 (H) 11/10/2022 0915   PO2ART 95 10/28/2022 0915   HCO3 27.4 10/27/2022 0915   TCO2 29 11/22/2022 0915   ACIDBASEDEF 1.0 10/25/2022 1836   O2SAT 97 11/02/2022 0915  Coagulation Profile: No results for input(s): "INR", "PROTIME" in the last 168 hours.  Cardiac Enzymes: No results for input(s): "CKTOTAL", "CKMB", "CKMBINDEX", "TROPONINI" in the last 168 hours.  HbA1C: Hgb A1c MFr Bld  Date/Time Value Ref Range Status  11/18/2022 10:42 PM 14.7 (H) 4.8 - 5.6 % Final    Comment:    (NOTE) Pre diabetes:          5.7%-6.4%  Diabetes:              >6.4%  Glycemic control for   <7.0% adults with diabetes     CBG: Recent Labs  Lab 11/06/22 1557 11/06/22 1939 11/06/22 2324 11/16/2022 0326 10/30/2022 0746  GLUCAP 308* 294* 335* 349* 305*    Review of Systems:     Past Medical History:  She,  has a past medical history  of Anxiety and depression, Depression (02/22/2015), Diabetes (HCC), Headache(784.0), High cholesterol, Hypercholesteremia, Hyperlipidemia, Hypertension, Hypothyroidism, Obesity, Panic disorder, Polycystic ovary, Pseudotumor cerebri, and PTSD (post-traumatic stress disorder).   Surgical History:   Past Surgical History:  Procedure Laterality Date   IRRIGATION AND DEBRIDEMENT BUTTOCKS Right 11/13/2022   Procedure: IRRIGATION AND DEBRIDEMENT BUTTOCKS;  Surgeon: Axel Filler, MD;  Location: [facility] OR;  Service: General;  Laterality: Right;   LUMBAR PUNCTURE       Social History:   reports that she has quit smoking. Her smoking use included cigarettes. She smoked an average of .25 packs per day. She has never used smokeless tobacco. She reports current drug use. Drug: Marijuana. She reports that she does not drink alcohol.   Family History:  Her family history includes Breast cancer in her maternal grandmother; Cancer in an other family member; Coronary artery disease in her mother and another family member; Diabetes in an other family member; Heart failure in her maternal grandfather; Kidney disease in her father; Narcolepsy in her maternal grandmother; Polycystic ovary syndrome in her mother.   Allergies Allergies  Allergen Reactions   Bupropion Other (See Comments), Palpitations and Shortness Of Breath   Anti-Inflammatory Enzyme [Nutritional Supplements] Swelling    And fluid on the brain   Hydroxyzine Other (See Comments)   Metformin Nausea Only and Other (See Comments)   Nsaids Other (See Comments), Hypertension and Swelling   Rosuvastatin Nausea And Vomiting and Other (See Comments)     Home Medications  Prior to Admission medications   Medication Sig Start Date End Date Taking? Authorizing Provider  acetaZOLAMIDE ER (DIAMOX) 500 MG capsule Take 1 capsule (500 mg total) by mouth in the morning, at noon, and at bedtime. 06/21/21 11/02/23 Yes Camara, Amalia Hailey, MD  glimepiride (AMARYL) 2 MG  tablet Take 2 mg by mouth daily. 05/29/21  Yes [provider]  hydrOXYzine (ATARAX) 25 MG tablet Take 25 mg by mouth 3 (three) times daily as needed for anxiety (first line).   Yes [provider]  levothyroxine (SYNTHROID) 75 MCG tablet Take 75 mcg by mouth daily. 06/02/21  Yes [provider]  pioglitazone (ACTOS) 15 MG tablet Take 45 mg by mouth daily. 02/18/21  Yes [provider]  pravastatin (PRAVACHOL) 20 MG tablet Take 20 mg by mouth once a week. Monday 05/29/21  Yes [provider]  QUEtiapine (SEROQUEL) 50 MG tablet Take 75 mg by mouth at bedtime. 01/17/22  Yes [provider]  escitalopram (LEXAPRO) 20 MG tablet Take 20 mg by mouth daily. Patient not taking: Reported on 11/11/2022    [provider]  irbesartan (AVAPRO) 300 MG tablet  Take 300 mg by mouth daily. Patient not taking: Reported on 11-09-22 05/31/21   [provider]  LORazepam (ATIVAN) 1 MG tablet Take 1 mg by mouth daily as needed for anxiety (second option). 02/11/22   [provider]  propranolol (INDERAL) 10 MG tablet Take 1 tablet (10 mg total) by mouth daily. Patient taking differently: Take 20 mg by mouth 2 (two) times daily. 03/28/22 11/02/23  Oneta Rack, NP     Critical care time: 25 min   Doran Stabler, DO Internal Medicine Residency My pager: [phone]

## 2022-11-07 NOTE — Anesthesia Procedure Notes (Signed)
Arterial Line Insertion Start/End1/15/2024 11:10 AM Performed by: Valda Favia, CRNA, CRNA  Patient location: OR. Preanesthetic checklist: patient identified, IV checked and monitors and equipment checked Patient sedated Left, radial was placed Catheter size: 20 G Hand hygiene performed   Attempts: 1 Procedure performed without using ultrasound guided technique. Following insertion, dressing applied and Biopatch. Post procedure assessment: unchanged  Patient tolerated the procedure well with no immediate complications.

## 2022-11-07 NOTE — Progress Notes (Signed)
  Transition of Care [facility]) Screening Note   Patient Details  Name: Lindsey [name] Date of Birth: 27-Dec-1985   Transition of Care [facility]) CM/SW Contact:    Bartholomew Crews, RN Phone Number: [phone] 11/18/2022, 1:18 PM  TOC following  - patient remains on the vent - 4th trip to OR today for debridement - change of IV antibiotics from zosyn to unasyn - cortrak, trickle feeds - financial counselor following  Transition of Care Department [facility]) has reviewed patient and no TOC needs have been identified at this time. We will continue to monitor patient advancement through interdisciplinary progression rounds. If new patient transition needs arise, please place a TOC consult.

## 2022-11-07 NOTE — Progress Notes (Signed)
Patient transported on ventilator from OR 1 to 1R94 with no complications noted.

## 2022-11-07 NOTE — Progress Notes (Signed)
Pharmacy Antibiotic Note  Lindsey [name] is a 37 y.o. female admitted on 11/17/2022 with  Fourniers Gangrene .  Pharmacy has been consulted for Unasyn dosing. Scr 1.19 and trending down. WBC 34.9. Patient is s/p I&D 1/10, 1/11, and 1/13 and planning to return to OR today Nov 19, 2022) for further I&D. Antibiotics de-escalated from Zosyn to Unasyn as wound culture from 1/10 is growing abundant group B strep, streptococcus mitis/oralis, and prevotella biva.   Plan: Unasyn 3 mg IV q6h Stop Zosyn Continue to follow OR cultures for further de-escalation Monitor for signs/symptoms of infection  Height: 5\' 6"  (167.6 cm) Weight: (!) 217 kg (478 lb 6.4 oz) IBW/kg (Calculated) : 59.3  Temp (24hrs), Avg:97.5 F (36.4 C), Min:97 F (36.1 C), Max:97.9 F (36.6 C)  Recent Labs  Lab 11/15/2022 1811 11/12/2022 1942 10/29/2022 2032 11/02/22 0930 11/02/22 1503 11/22/2022 0313 11/02/2022 0315 11/19/2022 1734 11/04/22 0505 11/04/22 1648 11/07/2022 0344 11/17/2022 1814 10/29/2022 2236 11/06/22 0050 19-Nov-2022 0045 11/19/2022 0328  WBC 22.8*  --    < >  --   --  23.3*  --   --  27.9*  --   --   --  25.2*  --  35.9* 34.9*  CREATININE 1.27*  --    < >  --    < >  --   --    < > 1.13*   < > 1.41* 1.32*  --  1.44* 1.19* 1.19*  LATICACIDVEN 3.5* 2.9*  --  1.6  --   --  1.0  --   --   --   --   --   --   --   --   --    < > = values in this interval not displayed.    Estimated Creatinine Clearance: 126.3 mL/min (A) (by C-G formula based on SCr of 1.19 mg/dL (H)).    Allergies  Allergen Reactions   Bupropion Other (See Comments), Palpitations and Shortness Of Breath   Anti-Inflammatory Enzyme [Nutritional Supplements] Swelling    And fluid on the brain   Hydroxyzine Other (See Comments)   Metformin Nausea Only and Other (See Comments)   Nsaids Other (See Comments), Hypertension and Swelling   Rosuvastatin Nausea And Vomiting and Other (See Comments)   Antimicrobials this admission: Cefepime 1/9  Flagyl 1/9  Vancomycin  1/10 Clindamycin 1/10 Linezolid 1/10>1/11 Zosyn 1/10 >>1/15 Unasyn 2022-11-19 >>  Dose adjustments this admission:  Microbiology results: 1/9 BCx: no growth 1/9 UCx: 70k yeast  1/10 Wound Cx: group B strep, strep mitis/oralis, prevotella bivia 1/10 MRSA PCR: negative  Thank you for allowing pharmacy to be a part of this patient's care.  Jeneen Rinks, Pharm.D PGY1 Pharmacy Resident 11-19-2022 10:59 AM

## 2022-11-08 ENCOUNTER — Encounter (HOSPITAL_COMMUNITY): Payer: Self-pay | Admitting: Surgery

## 2022-11-08 LAB — GLUCOSE, CAPILLARY
Glucose-Capillary: 155 mg/dL — ABNORMAL HIGH (ref 70–99)
Glucose-Capillary: 163 mg/dL — ABNORMAL HIGH (ref 70–99)
Glucose-Capillary: 163 mg/dL — ABNORMAL HIGH (ref 70–99)
Glucose-Capillary: 166 mg/dL — ABNORMAL HIGH (ref 70–99)
Glucose-Capillary: 167 mg/dL — ABNORMAL HIGH (ref 70–99)
Glucose-Capillary: 169 mg/dL — ABNORMAL HIGH (ref 70–99)
Glucose-Capillary: 170 mg/dL — ABNORMAL HIGH (ref 70–99)
Glucose-Capillary: 170 mg/dL — ABNORMAL HIGH (ref 70–99)
Glucose-Capillary: 171 mg/dL — ABNORMAL HIGH (ref 70–99)
Glucose-Capillary: 172 mg/dL — ABNORMAL HIGH (ref 70–99)
Glucose-Capillary: 173 mg/dL — ABNORMAL HIGH (ref 70–99)
Glucose-Capillary: 176 mg/dL — ABNORMAL HIGH (ref 70–99)
Glucose-Capillary: 178 mg/dL — ABNORMAL HIGH (ref 70–99)
Glucose-Capillary: 179 mg/dL — ABNORMAL HIGH (ref 70–99)
Glucose-Capillary: 180 mg/dL — ABNORMAL HIGH (ref 70–99)
Glucose-Capillary: 181 mg/dL — ABNORMAL HIGH (ref 70–99)
Glucose-Capillary: 185 mg/dL — ABNORMAL HIGH (ref 70–99)
Glucose-Capillary: 207 mg/dL — ABNORMAL HIGH (ref 70–99)
Glucose-Capillary: 212 mg/dL — ABNORMAL HIGH (ref 70–99)
Glucose-Capillary: 225 mg/dL — ABNORMAL HIGH (ref 70–99)

## 2022-11-08 LAB — BASIC METABOLIC PANEL
Anion gap: 8 (ref 5–15)
BUN: 49 mg/dL — ABNORMAL HIGH (ref 6–20)
CO2: 28 mmol/L (ref 22–32)
Calcium: 8.3 mg/dL — ABNORMAL LOW (ref 8.9–10.3)
Chloride: 110 mmol/L (ref 98–111)
Creatinine, Ser: 0.81 mg/dL (ref 0.44–1.00)
GFR, Estimated: >60 mL/min (ref >60)
Glucose, Bld: 182 mg/dL — ABNORMAL HIGH (ref 70–99)
Potassium: 3.7 mmol/L (ref 3.5–5.1)
Sodium: 146 mmol/L — ABNORMAL HIGH (ref 135–145)

## 2022-11-08 LAB — CBC
HCT: 23.4 % — ABNORMAL LOW (ref 36.0–46.0)
Hemoglobin: 7.5 g/dL — ABNORMAL LOW (ref 12.0–15.0)
MCH: 28.5 pg (ref 26.0–34.0)
MCHC: 32.1 g/dL (ref 30.0–36.0)
MCV: 89 fL (ref 80.0–100.0)
Platelets: 205 10*3/uL (ref 150–400)
RBC: 2.63 MIL/uL — ABNORMAL LOW (ref 3.87–5.11)
RDW: 17 % — ABNORMAL HIGH (ref 11.5–15.5)
WBC: 28.6 10*3/uL — ABNORMAL HIGH (ref 4.0–10.5)
nRBC: 1.5 % — ABNORMAL HIGH (ref 0.0–0.2)

## 2022-11-08 MED ORDER — FENTANYL 2500MCG IN NS 250ML (10MCG/ML) PREMIX INFUSION
INTRAVENOUS | Status: AC
  Filled 2022-11-08: qty 250

## 2022-11-08 MED ORDER — FUROSEMIDE 10 MG/ML IJ SOLN
40.0000 mg | Freq: Once | INTRAMUSCULAR | Status: AC
  Administered 2022-11-08: 40 mg via INTRAVENOUS
  Filled 2022-11-08: qty 4

## 2022-11-08 MED ORDER — HYDRALAZINE HCL 10 MG PO TABS
10.0000 mg | ORAL_TABLET | Freq: Four times a day (QID) | ORAL | Status: DC
  Administered 2022-11-08: 10 mg via ORAL
  Filled 2022-11-08: qty 1

## 2022-11-08 MED ORDER — NICARDIPINE HCL IN NACL 20-0.86 MG/200ML-% IV SOLN
3.0000 mg/h | INTRAVENOUS | Status: DC
  Administered 2022-11-08: 5 mg/h via INTRAVENOUS
  Administered 2022-11-08: 14 mg/h via INTRAVENOUS
  Administered 2022-11-08 (×2): 15 mg/h via INTRAVENOUS
  Filled 2022-11-08 (×6): qty 200

## 2022-11-08 MED ORDER — POTASSIUM CHLORIDE 20 MEQ PO PACK
40.0000 meq | PACK | Freq: Once | ORAL | Status: AC
  Administered 2022-11-08: 40 meq
  Filled 2022-11-08: qty 2

## 2022-11-08 MED ORDER — HYDRALAZINE HCL 10 MG PO TABS
10.0000 mg | ORAL_TABLET | Freq: Four times a day (QID) | ORAL | Status: DC
  Administered 2022-11-08 – 2022-11-09 (×3): 10 mg
  Filled 2022-11-08 (×5): qty 1

## 2022-11-08 MED ORDER — IRBESARTAN 300 MG PO TABS: 300.0000 mg | ORAL_TABLET | Freq: Every day | ORAL | Status: DC

## 2022-11-08 MED ORDER — FREE WATER
100.0000 mL | Freq: Four times a day (QID) | Status: DC
  Administered 2022-11-08 – 2022-11-09 (×4): 100 mL

## 2022-11-08 MED ORDER — FENTANYL 2500MCG IN NS 250ML (10MCG/ML) PREMIX INFUSION
0.0000 ug/h | INTRAVENOUS | Status: AC
  Administered 2022-11-08: 100 ug/h via INTRAVENOUS
  Administered 2022-11-09 (×2): 150 ug/h via INTRAVENOUS
  Administered 2022-11-10 – 2022-11-11 (×3): 200 ug/h via INTRAVENOUS
  Filled 2022-11-08 (×5): qty 250

## 2022-11-08 MED ORDER — NICARDIPINE HCL IN NACL 40-0.83 MG/200ML-% IV SOLN
3.0000 mg/h | INTRAVENOUS | Status: DC
  Administered 2022-11-08 (×2): 15 mg/h via INTRAVENOUS
  Administered 2022-11-08 – 2022-11-09 (×2): 5 mg/h via INTRAVENOUS
  Filled 2022-11-08 (×6): qty 200

## 2022-11-08 MED ORDER — BANATROL TF EN LIQD
60.0000 mL | Freq: Two times a day (BID) | ENTERAL | Status: DC
  Administered 2022-11-08 – 2022-11-12 (×10): 60 mL
  Filled 2022-11-08 (×10): qty 60

## 2022-11-08 NOTE — Progress Notes (Signed)
Progress Note  1 Day Post-Op  Subjective: Alert on the vent. Discussed plan to evaluate wound at bedside tomorrow AM with CCM.   Objective: Vital signs in last 24 hours: Temp:  [97.5 F (36.4 C)-97.7 F (36.5 C)] 97.5 F (36.4 C) (01/16 0700) Pulse Rate:  [63-94] 89 (01/16 0803) Resp:  [0-31] 30 (01/16 0803) BP: (113-206)/(48-77) 177/52 (01/16 0803) SpO2:  [96 %-100 %] 99 % (01/16 0803) Arterial Line BP: (141-294)/(33-286) 174/52 (01/16 0800) FiO2 (%):  [40 %-100 %] 40 % (01/16 0803) Weight:  [035 kg] 214 kg (01/16 0545) Last BM Date : 11/06/2022  Intake/Output from previous day: 01/15 0701 - 01/16 0700 In: 2689.3 [I.V.:1744.4; NG/GT:616.5; IV Piggyback:328.4] Out: 4656 [Urine:3675; Stool:40; Blood:20] Intake/Output this shift: Total I/O In: 243.5 [I.V.:188.5; NG/GT:55] Out: -   PE: General: WD, morbidly obese female who is alert on the vent Heart: regular, rate, and rhythm.  Lungs: vent  Will evaluate wound at bedside tomorrow    Lab Results:  Recent Labs    10/28/2022 0328 11/12/2022 0915 11/08/22 0317  WBC 34.9*  --  28.6*  HGB 8.1* 14.3 7.5*  HCT 25.8* 42.0 23.4*  PLT 204  --  205   BMET Recent Labs    11/14/2022 0328 11/12/2022 0915 11/08/22 0317  NA 141 147* 146*  K 3.8 3.9 3.7  CL 105  --  110  CO2 24  --  28  GLUCOSE 333*  --  182*  BUN 66*  --  49*  CREATININE 1.19*  --  0.81  CALCIUM 7.6*  --  8.3*   PT/INR No results for input(s): "LABPROT", "INR" in the last 72 hours. CMP     Component Value Date/Time   NA 146 (H) 11/08/2022 0317   K 3.7 11/08/2022 0317   CL 110 11/08/2022 0317   CO2 28 11/08/2022 0317   GLUCOSE 182 (H) 11/08/2022 0317   BUN 49 (H) 11/08/2022 0317   CREATININE 0.81 11/08/2022 0317   CALCIUM 8.3 (L) 11/08/2022 0317   PROT 5.7 (L) 11/06/2022 0050   ALBUMIN <1.5 (L) 11/06/2022 0050   AST 19 11/06/2022 0050   ALT 19 11/06/2022 0050   ALKPHOS 83 11/06/2022 0050   BILITOT 1.4 (H) 11/06/2022 0050   GFRNONAA >60  11/08/2022 0317   GFRAA >60 07/03/2016 0004   Lipase  No results found for: "LIPASE"     Studies/Results: DG Abd Portable 1V  Result Date: 11/06/2022 CLINICAL DATA:  Feeding tube placement EXAM: PORTABLE ABDOMEN - 1 VIEW COMPARISON:  11/02/2022 FINDINGS: The feeding tube tip is in the stomach antrum. Previous nasogastric tube is been removed. Gas is visible in the transverse colon. No dilated upper abdominal bowel observed. IMPRESSION: 1. Feeding tube tip is in the stomach antrum. Electronically Signed   By: Van Clines M.D.   On: 11/11/2022 10:21   DG Chest Port 1 View  Result Date: 10/31/2022 CLINICAL DATA:  Respiratory failure. EXAM: PORTABLE CHEST 1 VIEW COMPARISON:  11/06/2022 FINDINGS: Endotracheal tube is in place, proximally 4 centimeters above the carina. Nasogastric tube is in place, tip beyond the edge of the image at least to the level of the LOWER esophagus. RIGHT IJ central line tip overlies the level of the superior vena cava. No new consolidations. IMPRESSION: 1. Lines and tubes in good position. 2. No new consolidations. Electronically Signed   By: Nolon Nations M.D.   On: 10/28/2022 07:18   CT HEAD WO CONTRAST (5MM)  Result Date: 11/06/2022 CLINICAL  DATA:  Mental status change, unknown cause mental status EXAM: CT HEAD WITHOUT CONTRAST TECHNIQUE: Contiguous axial images were obtained from the base of the skull through the vertex without intravenous contrast. RADIATION DOSE REDUCTION: This exam was performed according to the departmental dose-optimization program which includes automated exposure control, adjustment of the mA and/or kV according to patient size and/or use of iterative reconstruction technique. COMPARISON:  07/02/2016 FINDINGS: Brain: No evidence of acute infarction, hemorrhage, hydrocephalus, extra-axial collection or mass lesion/mass effect. Vascular: No hyperdense vessel or unexpected calcification. Skull: Normal. Negative for fracture or focal  lesion. Sinuses/Orbits: No acute finding. Other: Partially imaged endotracheal and orogastric tubes. IMPRESSION: No acute intracranial findings. Electronically Signed   By: Davina Poke D.O.   On: 11/06/2022 14:18    Anti-infectives: Anti-infectives (From admission, onward)    Start     Dose/Rate Route Frequency Ordered Stop   2022/11/10 1630  Ampicillin-Sulbactam (UNASYN) 3 g in sodium chloride 0.9 % 100 mL IVPB        3 g 200 mL/hr over 30 Minutes Intravenous Every 6 hours 11/10/22 1051     11/06/2022 1600  piperacillin-tazobactam (ZOSYN) IVPB 3.375 g  Status:  Discontinued        3.375 g 12.5 mL/hr over 240 Minutes Intravenous Every 8 hours 10/24/2022 1459 Nov 10, 2022 1031   11/02/22 1400  linezolid (ZYVOX) IVPB 600 mg  Status:  Discontinued        600 mg 300 mL/hr over 60 Minutes Intravenous Every 12 hours 11/02/22 1222 11/04/22 0830   11/02/22 1000  vancomycin (VANCOREADY) IVPB 1250 mg/250 mL  Status:  Discontinued        1,250 mg 166.7 mL/hr over 90 Minutes Intravenous Every 12 hours 10/27/2022 2135 10/24/2022 2137   11/02/22 1000  vancomycin (VANCOREADY) IVPB 1500 mg/300 mL  Status:  Discontinued        1,500 mg 150 mL/hr over 120 Minutes Intravenous Every 12 hours 11/16/2022 2137 11/02/22 1222   11/02/22 0300  ceFEPIme (MAXIPIME) 2 g in sodium chloride 0.9 % 100 mL IVPB  Status:  Discontinued        2 g 200 mL/hr over 30 Minutes Intravenous Every 8 hours 11/21/2022 2135 11/04/2022 2258   11/02/22 0045  vancomycin (VANCOREADY) IVPB 2000 mg/400 mL        2,000 mg 200 mL/hr over 120 Minutes Intravenous  Once 11/02/22 0030 11/02/22 0305   11/17/2022 2315  piperacillin-tazobactam (ZOSYN) IVPB 3.375 g  Status:  Discontinued        3.375 g 12.5 mL/hr over 240 Minutes Intravenous Every 8 hours 10/29/2022 2306 10/30/2022 1459   11/04/2022 2315  clindamycin (CLEOCIN) IVPB 900 mg  Status:  Discontinued        900 mg 100 mL/hr over 30 Minutes Intravenous Every 8 hours 11/14/2022 2306 11/02/22 1222   10/25/2022  1930  vancomycin (VANCOCIN) IVPB 1000 mg/200 mL premix  Status:  Discontinued       See Hyperspace for full Linked Orders Report.   1,000 mg 200 mL/hr over 60 Minutes Intravenous  Once 11/11/2022 1823 11/02/22 0029   11/08/2022 1830  ceFEPIme (MAXIPIME) 2 g in sodium chloride 0.9 % 100 mL IVPB        2 g 200 mL/hr over 30 Minutes Intravenous  Once 10/24/2022 1819 11/04/2022 2016   11/13/2022 1830  metroNIDAZOLE (FLAGYL) IVPB 500 mg        500 mg 100 mL/hr over 60 Minutes Intravenous  Once 11/14/2022 1819 11/02/2022 2130  11/21/2022 1830  vancomycin (VANCOCIN) IVPB 1000 mg/200 mL premix  Status:  Discontinued        1,000 mg 200 mL/hr over 60 Minutes Intravenous  Once 11/18/2022 1819 11/04/2022 1823   10/26/2022 1830  vancomycin (VANCOCIN) IVPB 1000 mg/200 mL premix  Status:  Discontinued       See Hyperspace for full Linked Orders Report.   1,000 mg 200 mL/hr over 60 Minutes Intravenous  Once 10/29/2022 1823 11/02/22 0029        Assessment/Plan  s/p debridement of buttock NSTI  11/06/2022 Dr. Derrell Lolling  S/p excisional debridement buttock and perineal wound 11/09/2022 Dr. Dwain Sarna S/p excisional debridement perineal wound 10/26/2022 Dr. Derrell Lolling S/P excisional debridement perineal wound and buttock wound 11-11-2022 Dr. Magnus Ivan - leave packing in place today - will plan to change dressing at bedside tomorrow morning with possible conscious sedation and ETT in place - will determine if any further debridement in OR needed at that time  - cont abx therapy, prelim cx  MODERATE GRAM POSITIVE COCCI IN PAIRS AND CHAINS  MODERATE GRAM NEGATIVE RODS - GBS and strep mitis/oralis   FEN - NPO on vent, tube feeds VTE - SQH ID - Clindamycin, Zosyn, Vanc; narrowed to unasyn 1/15>>   - below per CCM -  Septic shock - secondary to above, now off pressors.  Appreciate CCM's care of this patient as she is critically ill ARF - cr 0.8, improved DM - on insulin gtt  Morbid obesity - BMI 72 Hypothryoidism PCOS HLD Anxiety/Depression     LOS: 7 days    Juliet Rude, [facility] 11/08/2022, 10:41 AM Please see Amion for pager number during day hours 7:00am-4:30pm

## 2022-11-08 NOTE — Progress Notes (Signed)
Pharmacy Electrolyte Replacement  Recent Labs:  Recent Labs    10/31/2022 0328 11/08/2022 0915 11/08/22 0317  K 3.8   < > 3.7  MG 3.0*  --   --   PHOS 3.6  --   --   CREATININE 1.19*  --  0.81   < > = values in this interval not displayed.    Low Critical Values (K </= 2.5, Phos </= 1, Mg </= 1) Present: None  MD Contacted: n/a  Plan: Potassium chloride 40 mEq per tube x1 Monitor repeat electrolytes

## 2022-11-08 NOTE — Progress Notes (Signed)
RT placed pt on PS/CPAP. Pt tolerating well at this time. RN notified.

## 2022-11-08 NOTE — Progress Notes (Signed)
Inpatient Diabetes Program Recommendations  AACE/ADA: New Consensus Statement on Inpatient Glycemic Control (2015)  Target Ranges:  Prepandial:   less than 140 mg/dL      Peak postprandial:   less than 180 mg/dL (1-2 hours)      Critically ill patients:  140 - 180 mg/dL   Lab Results  Component Value Date   GLUCAP 170 (H) 11/08/2022   HGBA1C 14.7 (H) 10/30/2022    Review of Glycemic Control  Latest Reference Range & Units 11/08/22 07:22 11/08/22 08:27 11/08/22 09:31  Glucose-Capillary 70 - 99 mg/dL 181 (H) 163 (H) 170 (H)   Diabetes history: DM  Outpatient Diabetes medications: Actos 45 mg daily, Amaryl 2 mg daily  Current orders for Inpatient glycemic control:  IV insulin- Vital 75 ml/hr Inpatient Diabetes Program Recommendations:    Note that insulin drip rate>10 units an hour.  She appears to need IV insulin at this time.   Thanks,  Adah Perl, RN, BC-ADM Inpatient Diabetes Coordinator Pager [phone]  (8a-5p)

## 2022-11-08 NOTE — Progress Notes (Incomplete)
Attending note: I have seen and examined the patient. History, labs and imaging reviewed.  On cardene drip for hypotension  Blood pressure (!) 177/52, pulse 89, temperature (!) 97.5 F (36.4 C), temperature source Axillary, resp. rate (!) 30, height 5\' 6"  (1.676 m), weight (!) 214 kg, SpO2 99 %. 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 May need need drip if sugars are elevated  Hypertension Continue cardene drip, lasix for diuresis Start hydralazine PO  Rsp failure  WEans as toelrated  Free water for tube feeds  The patient is critically ill with multiple organ systems failure and requires high complexity decision making for assessment and support, frequent evaluation and titration of therapies, application of advanced monitoring technologies and extensive interpretation of multiple databases.  Critical care time - 35 mins. This represents my time independent of the NPs time taking care of the pt.  Marshell Garfinkel MD [facility] 11/08/2022, 10:45 AM

## 2022-11-08 NOTE — Progress Notes (Addendum)
NAME:  Lindsey [name], MRN:  [id number], DOB:  May 26, 1986, LOS: 7 ADMISSION DATE:  10/31/2022, CONSULTATION DATE:  11/13/2022 REFERRING MD:  Dr. Rubin Payor , CHIEF COMPLAINT:  HHS   History of Present Illness:  Lindsey [name] is a 37 y.o. female who has a PMH as below including DM2. She presented to [facility] 1/9 with fatigue, dyspnea, buttock wounds that had been getting worse. First noticed buttock wound around thanksgiving but it healed on its own. Then recurred around new years and has persisted and worsened to the point of malodorous drainage. She has also had fatigue and dyspnea. She apparently had been eating badly around the holidays and her CBG's had been reading high. Normally she states that CBG's are well controlled.   In ED, she was found to have HHS. 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   While at [facility], she also developed A.Fib RVR for which she received Amiodarone bolus x 2 followed by infusion. She was given one dose of Flagyl and was transferred to [facility] ED for surgical evaluation. En route to [facility], she converted to NSR. BP has been stable since ED presentation.   She was evaluated by surgery and is being taken to the OR for debridement tonight. She is currently on Insulin infusion, Amiodarone infusion, potassium supplementation. Her mother is with her at the bedside.  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   Significant Hospital Events: Including procedures, antibiotic start and stop dates in addition to other pertinent events   CT abdomen pelvis 1/9 > basilar atelectasis, normal stomach, appendix, bowel wall, no hernia, mild to moderate subcutaneous impaired muscular inflammatory fat stranding anterolateral aspect abdominal wall on the left, no ascites.  Soft tissue gas along the perineum on the right with medial and posterior medial right gluteal involvement consistent with Fournier's gangrene OR for debridement 1/9, 11-08-22, 1/13 Blood cultures 1/9 >  Urine culture 1/9 > yeast 70 K Right buttock wound culture 1/10 > GPC, GNR > abundant group B strep, abundant strep mitis PICC placed 1/12  Interim History / Subjective:  Patient is intubated.  She is awake and follow commands.  She denies any discomfort or pain.  Objective   Blood pressure 131/71, pulse 91, temperature (!) 97.5 F (36.4 C), temperature source Axillary, resp. rate (!) 25, height 5\' 6"  (1.676 m), weight (!) 214 kg, SpO2 99 %. CVP:  [6 mmHg-16 mmHg] 6 mmHg  Vent Mode: PRVC FiO2 (%):  [40 %-100 %] 40 % Set Rate:  [28 bmp] 28 bmp Vt Set:  [480 mL] 480 mL PEEP:  [10 cmH20] 10 cmH20 Plateau Pressure:  [22 cmH20-30 cmH20] 27 cmH20   Intake/Output Summary (Last 24 hours) at 11/08/2022 0810 Last data filed at 11/08/2022 0725 Gross per 24 hour  Intake 2466.66 ml  Output 3120 ml  Net -653.34 ml    Filed Weights   11/06/22 0500 11/09/2022 0507 11/08/22 0545  Weight: (!) 217.6 kg (!) 217 kg (!) 214 kg  Examination:  Physical Exam Constitutional:      Appearance: She is ill-appearing.     Comments: Somnolent but awakes to verbal stimulation.  HENT:     Mouth/Throat:     Comments: ET tube in place. Eyes:     General:        Right eye: No discharge.        Left eye: No discharge.  Cardiovascular:     Rate and Rhythm: Normal rate and  regular rhythm.  Pulmonary:     Effort: Pulmonary effort is normal. No respiratory distress.  Abdominal:     General: Bowel sounds are normal.     Palpations: Abdomen is soft.  Musculoskeletal:     Right lower leg: No edema.     Left lower leg: No edema.  Skin:    General: Skin is warm.  Neurological:     Comments: Patient can follow commands.  Able to move all extremities.      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   Wound culture with group B strep, nontoxin producing, strep mitis -Greatly appreciate surgery management.  S/p debridement on 1/9, 1/11, 12/08/2022 and 1/15.  It looks that her wounds are improving.  There was only minimal debridement done yesterday.  The infection did not appear to be extending any further.  Will reach out to surgery team to see if they will need any additional surgeries.  If not we can try to work on weaning off of the ventilator and extubation. -Continue Unasyn -Scheduled Percocet and fentanyl as needed for pain control  Severe uncontrolled hypertension Her blood pressure remains elevated after surgery yesterday with systolic in the 818H -631S.  Fentanyl and Versed were given without any effect.  Pain agitation ruled out.  I suspect her baseline hypertension is elevated and now we can capture it truly on the A-line.  We were unable to control her blood pressure with nitro drip so Cardene drip was started overnight.  Avoid Cleviprex given hypertriglyceridemia. -Continue Cardene and nitro drip, goal SBP < 180 -Start Po hydralazine 10 mg q6h  Type 2 diabetes A1C 14.7 -Home glimepiride and pioglitazone are on hold -Starting insulin drip due to high requirement of insulin.  Goal CBG less than 180 for wound healing.  Acute metabolic encephalopathy  Initially due to HHS, acute infection with septic shock.  CT head was negative for any acute intracranial abnormality.   Her mentation is improving.  The driving force right now is her severe infection and Fournier gangrene. -Continue holding all sedation.   Acute respiratory failure due to acute encephalopathy from infection 2/2 for near gangrene, also possible contribution of evolving bilateral pulmonary infiltrates with total body volume overload, pulmonary edema -Continue PRVC 8 cc/kg -Will attempt weaning today.  -Patient has great output from overnight. She is still +14L. Will give another dose of IV lasix 40 mg  Hypernatremia Added free water to her tube feed   Hx Hypothyroidism. -Continue Home levothyroxine dose 75 mcg daily   Hx Anxiety, Depression, Panic disorder, PTSD. -Appreciate pharmacy assistance, clarified home regimen.  She is no longer on most of her outpatient meds, only on quetiapine at night.  Holding for now   Idiopathic intracranial hypertension -Has been managed with Diamox as an outpatient, continue to hold for now  Best Practice (right click and "Reselect all SmartList Selections" daily)   Diet/type: tube feed DVT prophylaxis: prophylactic  heparin  GI prophylaxis: PPI Lines: Central line, will remove today Foley:  Yes, and it is still needed Code Status:  full code Last date of multidisciplinary goals of care discussion [1/15]  Labs   CBC: Recent Labs  Lab 11/14/2022 1811 11/13/2022 2024 11/04/22 0505 11/04/22 0836 29-Nov-2022 2236 11/06/22 1931 11/13/2022 0045 11/09/2022 0328 11/11/2022 0915 11/08/22 0317  WBC 22.8*   < > 27.9*  --  25.2*  --  35.9* 34.9*  --  28.6*  NEUTROABS 18.4*  --   --   --   --   --   --   --   --   --   HGB 11.3*   < > 7.6*   < > 6.2* 7.8* 8.4* 8.1* 14.3 7.5*  HCT 34.0*   < > 22.7*   < > 18.4* 23.6* 26.5* 25.8* 42.0 23.4*  MCV 83.1   < > 83.8  --  85.6  --  89.8 90.2  --  89.0  PLT 240   < > 217  --  166  --  187 204  --  205   < > = values in this interval not displayed.     Basic Metabolic Panel: Recent Labs  Lab 11/02/2022 0313  11/11/2022 1014 11/04/22 0505 11/04/22 0836 29-Nov-2022 0344 November 29, 2022 1814 Nov 29, 2022 1836 11/06/22 0050 11/06/2022 0045 11/23/2022 0328 10/30/2022 0915 11/08/22 0317  NA  --    < > 132*   < > 134* 134*   < > 136 139 141 147* 146*  K  --    < > 3.2*   < > 4.3 4.5   < > 4.9 3.6 3.8 3.9 3.7  CL  --    < > 97*   < > 99 101  --  105 107 105  --  110  CO2  --    < > 24   < > 23 23  --  23 23 24   --  28  GLUCOSE  --    < > 148*   < > 207* 240*  --  222* 335* 333*  --  182*  BUN  --    < > 45*   < > 51* 61*  --  65* 71* 66*  --  49*  CREATININE  --    < > 1.13*   < > 1.41* 1.32*  --  1.44* 1.19* 1.19*  --  0.81  CALCIUM  --    < > 6.5*   < > 7.0* 7.2*  --  7.5* 7.6* 7.6*  --  8.3*  MG 2.7*  --  2.6*  --  2.9* 3.0*  --   --  2.9* 3.0*  --   --   PHOS 3.9  --  2.4*  --   --   --   --   --  3.6 3.6  --   --    < > = values in this interval not displayed.    GFR: Estimated Creatinine Clearance: 183.7 mL/min (by C-G formula based on SCr of 0.81 mg/dL). Recent Labs  Lab 10/28/2022 1811 10/25/2022 1942 11/02/22 0500 11/02/22 0930 11/11/2022 0313 11/13/2022 0315 11/04/22 0505 11-29-22 2236 11/04/2022 0045 10/28/2022 0328 11/08/22 0317  WBC 22.8*  --    < >  --    < >  --    < > 25.2* 35.9* 34.9* 28.6*  LATICACIDVEN 3.5* 2.9*  --  1.6  --  1.0  --   --   --   --   --    < > =  values in this interval not displayed.     Liver Function Tests: Recent Labs  Lab 11/04/2022 0000 11/06/22 0050  AST 29 19  ALT 26 19  ALKPHOS 85 83  BILITOT 0.9 1.4*  PROT 5.7* 5.7*  ALBUMIN 1.5* <1.5*    No results for input(s): "LIPASE", "AMYLASE" in the last 168 hours. No results for input(s): "AMMONIA" in the last 168 hours.  ABG    Component Value Date/Time   PHART 7.343 (L) 11/04/2022 0915   PCO2ART 50.0 (H) 11/14/2022 0915   PO2ART 95 11/09/2022 0915   HCO3 27.4 10/30/2022 0915   TCO2 29 11/08/2022 0915   ACIDBASEDEF 1.0 11/02/2022 1836   O2SAT 97 11/09/2022 0915     Coagulation Profile: No results for  input(s): "INR", "PROTIME" in the last 168 hours.  Cardiac Enzymes: No results for input(s): "CKTOTAL", "CKMB", "CKMBINDEX", "TROPONINI" in the last 168 hours.  HbA1C: Hgb A1c MFr Bld  Date/Time Value Ref Range Status  11/30/2022 10:42 PM 14.7 (H) 4.8 - 5.6 % Final    Comment:    (NOTE) Pre diabetes:          5.7%-6.4%  Diabetes:              >6.4%  Glycemic control for   <7.0% adults with diabetes     CBG: Recent Labs  Lab 11/08/22 0307 11/08/22 0410 11/08/22 0511 11/08/22 0632 11/08/22 0722  GLUCAP 166* 167* 163* 155* 181*     Review of Systems:     Past Medical History:  She,  has a past medical history of Anxiety and depression, Depression (02/22/2015), Diabetes (HCC), Headache(784.0), High cholesterol, Hypercholesteremia, Hyperlipidemia, Hypertension, Hypothyroidism, Obesity, Panic disorder, Polycystic ovary, Pseudotumor cerebri, and PTSD (post-traumatic stress disorder).   Surgical History:   Past Surgical History:  Procedure Laterality Date   INCISION AND DRAINAGE PERIRECTAL ABSCESS N/A 11/12/2022   Procedure: EXCISIONAL DEBRIDEMENT PERINEAL WOUND;  Surgeon: Axel Filler, MD;  Location: [facility] OR;  Service: General;  Laterality: N/A;   IRRIGATION AND DEBRIDEMENT ABSCESS N/A 10/28/2022   Procedure: EXCISIONAL DEBRIDEMENT PERINEAL WOUND;  Surgeon: Emelia Loron, MD;  Location: [facility] OR;  Service: General;  Laterality: N/A;   IRRIGATION AND DEBRIDEMENT BUTTOCKS Right 11/30/22   Procedure: IRRIGATION AND DEBRIDEMENT BUTTOCKS;  Surgeon: Axel Filler, MD;  Location: [facility] OR;  Service: General;  Laterality: Right;   LUMBAR PUNCTURE     WOUND DEBRIDEMENT N/A 11/15/2022   Procedure: EXCISIONAL DEBRIDEMENT OF BUTTOCK;  Surgeon: Emelia Loron, MD;  Location: [facility] OR;  Service: General;  Laterality: N/A;     Social History:   reports that she has quit smoking. Her smoking use included cigarettes. She smoked an average of .25 packs per day. She has never used  smokeless tobacco. She reports current drug use. Drug: Marijuana. She reports that she does not drink alcohol.   Family History:  Her family history includes Breast cancer in her maternal grandmother; Cancer in an other family member; Coronary artery disease in her mother and another family member; Diabetes in an other family member; Heart failure in her maternal grandfather; Kidney disease in her father; Narcolepsy in her maternal grandmother; Polycystic ovary syndrome in her mother.   Allergies Allergies  Allergen Reactions   Bupropion Other (See Comments), Palpitations and Shortness Of Breath   Anti-Inflammatory Enzyme [Nutritional Supplements] Swelling    And fluid on the brain   Hydroxyzine Other (See Comments)   Metformin Nausea Only and Other (See Comments)   Nsaids Other (See Comments),  Hypertension and Swelling   Rosuvastatin Nausea And Vomiting and Other (See Comments)     Home Medications  Prior to Admission medications   Medication Sig Start Date End Date Taking? Authorizing Provider  acetaZOLAMIDE ER (DIAMOX) 500 MG capsule Take 1 capsule (500 mg total) by mouth in the morning, at noon, and at bedtime. 06/21/21 11/02/23 Yes Camara, Amalia Hailey, MD  glimepiride (AMARYL) 2 MG tablet Take 2 mg by mouth daily. 05/29/21  Yes [provider]  hydrOXYzine (ATARAX) 25 MG tablet Take 25 mg by mouth 3 (three) times daily as needed for anxiety (first line).   Yes [provider]  levothyroxine (SYNTHROID) 75 MCG tablet Take 75 mcg by mouth daily. 06/02/21  Yes [provider]  pioglitazone (ACTOS) 15 MG tablet Take 45 mg by mouth daily. 02/18/21  Yes [provider]  pravastatin (PRAVACHOL) 20 MG tablet Take 20 mg by mouth once a week. Monday 05/29/21  Yes [provider]  QUEtiapine (SEROQUEL) 50 MG tablet Take 75 mg by mouth at bedtime. 01/17/22  Yes [provider]  escitalopram (LEXAPRO) 20 MG tablet Take 20 mg by mouth daily. Patient not  taking: Reported on 11/18/2022    [provider]  irbesartan (AVAPRO) 300 MG tablet Take 300 mg by mouth daily. Patient not taking: Reported on 10/31/2022 05/31/21   [provider]  LORazepam (ATIVAN) 1 MG tablet Take 1 mg by mouth daily as needed for anxiety (second option). 02/11/22   [provider]  propranolol (INDERAL) 10 MG tablet Take 1 tablet (10 mg total) by mouth daily. Patient taking differently: Take 20 mg by mouth 2 (two) times daily. 03/28/22 11/02/23  Oneta Rack, NP     Critical care time: 25 min   Doran Stabler, DO Internal Medicine Residency My pager: [phone]

## 2022-11-08 NOTE — Progress Notes (Signed)
eLink Physician-Brief Progress Note Patient Name: Lindsey [name] DOB: 1986/05/11 MRN: [id number]   Date of Service  11/08/2022  HPI/Events of Note  Patient with sub-optimal sedation on the ventilator.  eICU Interventions  Fentanyl gtt ordered.        Kerry Kass Tami Blass 11/08/2022, 10:14 PM

## 2022-11-08 NOTE — Progress Notes (Signed)
eLink Physician-Brief Progress Note Patient Name: Lindsey [name] DOB: March 30, 1986 MRN: [id number]   Date of Service  11/08/2022  HPI/Events of Note  Uncontrolled hypertension, on NTG 200, got hydralazine and labetalol IV twice.  Camera: In synchrony with vent, Art line wave form good, SBP rising back up to 176, post labetalolol per RN discussion. HR 81.   Data: Creatinine is ok.  Post op , on vent.   eICU Interventions  Start Cardene drip, wean off NTG drip. Continue prn BP control meds. Goal SBP < 180, per surgery.      Intervention Category Intermediate Interventions: Hypertension - evaluation and management  Elmer Sow 11/08/2022, 2:36 AM

## 2022-11-09 ENCOUNTER — Inpatient Hospital Stay (HOSPITAL_COMMUNITY): Payer: Medicaid Other

## 2022-11-09 LAB — BASIC METABOLIC PANEL
Anion gap: 7 (ref 5–15)
Anion gap: 5 (ref 5–15)
BUN: 32 mg/dL — ABNORMAL HIGH (ref 6–20)
BUN: 26 mg/dL — ABNORMAL HIGH (ref 6–20)
CO2: 30 mmol/L (ref 22–32)
CO2: 30 mmol/L (ref 22–32)
Calcium: 8.1 mg/dL — ABNORMAL LOW (ref 8.9–10.3)
Calcium: 7.8 mg/dL — ABNORMAL LOW (ref 8.9–10.3)
Chloride: 114 mmol/L — ABNORMAL HIGH (ref 98–111)
Chloride: 118 mmol/L — ABNORMAL HIGH (ref 98–111)
Creatinine, Ser: 0.7 mg/dL (ref 0.44–1.00)
Creatinine, Ser: 0.63 mg/dL (ref 0.44–1.00)
GFR, Estimated: >60 mL/min (ref >60)
GFR, Estimated: >60 mL/min (ref >60)
Glucose, Bld: 158 mg/dL — ABNORMAL HIGH (ref 70–99)
Glucose, Bld: 247 mg/dL — ABNORMAL HIGH (ref 70–99)
Potassium: 3.9 mmol/L (ref 3.5–5.1)
Potassium: 4.1 mmol/L (ref 3.5–5.1)
Sodium: 151 mmol/L — ABNORMAL HIGH (ref 135–145)
Sodium: 153 mmol/L — ABNORMAL HIGH (ref 135–145)

## 2022-11-09 LAB — CBC
HCT: 30.1 % — ABNORMAL LOW (ref 36.0–46.0)
HCT: 24 % — ABNORMAL LOW (ref 36.0–46.0)
Hemoglobin: 9.7 g/dL — ABNORMAL LOW (ref 12.0–15.0)
Hemoglobin: 7.5 g/dL — ABNORMAL LOW (ref 12.0–15.0)
MCH: 29.2 pg (ref 26.0–34.0)
MCH: 29.6 pg (ref 26.0–34.0)
MCHC: 32.2 g/dL (ref 30.0–36.0)
MCHC: 31.3 g/dL (ref 30.0–36.0)
MCV: 90.7 fL (ref 80.0–100.0)
MCV: 94.9 fL (ref 80.0–100.0)
Platelets: 192 10*3/uL (ref 150–400)
Platelets: 202 10*3/uL (ref 150–400)
RBC: 3.32 MIL/uL — ABNORMAL LOW (ref 3.87–5.11)
RBC: 2.53 MIL/uL — ABNORMAL LOW (ref 3.87–5.11)
RDW: 17.4 % — ABNORMAL HIGH (ref 11.5–15.5)
RDW: 17.5 % — ABNORMAL HIGH (ref 11.5–15.5)
WBC: 22.8 10*3/uL — ABNORMAL HIGH (ref 4.0–10.5)
WBC: 23.7 10*3/uL — ABNORMAL HIGH (ref 4.0–10.5)
nRBC: 2.8 % — ABNORMAL HIGH (ref 0.0–0.2)
nRBC: 2 % — ABNORMAL HIGH (ref 0.0–0.2)

## 2022-11-09 LAB — TYPE AND SCREEN
ABO/RH(D): A POS
Antibody Screen: NEGATIVE
Unit division: 0
Unit division: 0

## 2022-11-09 LAB — BPAM RBC
Blood Product Expiration Date: 202402112359
Blood Product Expiration Date: 202402112359
ISSUE DATE / TIME: 202401151050
ISSUE DATE / TIME: 202401151050
Unit Type and Rh: 6200
Unit Type and Rh: 6200

## 2022-11-09 LAB — GLUCOSE, CAPILLARY
Glucose-Capillary: 177 mg/dL — ABNORMAL HIGH (ref 70–99)
Glucose-Capillary: 157 mg/dL — ABNORMAL HIGH (ref 70–99)
Glucose-Capillary: 153 mg/dL — ABNORMAL HIGH (ref 70–99)
Glucose-Capillary: 147 mg/dL — ABNORMAL HIGH (ref 70–99)
Glucose-Capillary: 176 mg/dL — ABNORMAL HIGH (ref 70–99)
Glucose-Capillary: 149 mg/dL — ABNORMAL HIGH (ref 70–99)
Glucose-Capillary: 178 mg/dL — ABNORMAL HIGH (ref 70–99)
Glucose-Capillary: 164 mg/dL — ABNORMAL HIGH (ref 70–99)
Glucose-Capillary: 170 mg/dL — ABNORMAL HIGH (ref 70–99)
Glucose-Capillary: 209 mg/dL — ABNORMAL HIGH (ref 70–99)
Glucose-Capillary: 255 mg/dL — ABNORMAL HIGH (ref 70–99)

## 2022-11-09 LAB — BRAIN NATRIURETIC PEPTIDE: B Natriuretic Peptide: 175.7 pg/mL — ABNORMAL HIGH (ref 0.0–100.0)

## 2022-11-09 MED ORDER — DAKINS (1/4 STRENGTH) 0.125 % EX SOLN
Freq: Every day | CUTANEOUS | Status: AC
  Administered 2022-11-11: 1
  Filled 2022-11-09 (×2): qty 473

## 2022-11-09 MED ORDER — DEXMEDETOMIDINE HCL IN NACL 400 MCG/100ML IV SOLN
0.0000 ug/kg/h | INTRAVENOUS | Status: AC
  Administered 2022-11-09 – 2022-11-10 (×3): 0.4 ug/kg/h via INTRAVENOUS
  Administered 2022-11-10: 0.6 ug/kg/h via INTRAVENOUS
  Administered 2022-11-10 (×2): 0.4 ug/kg/h via INTRAVENOUS
  Filled 2022-11-09 (×6): qty 100

## 2022-11-09 MED ORDER — INSULIN ASPART 100 UNIT/ML IJ SOLN
12.0000 [IU] | INTRAMUSCULAR | Status: DC
  Administered 2022-11-09 – 2022-11-10 (×5): 12 [IU] via SUBCUTANEOUS

## 2022-11-09 MED ORDER — FREE WATER
100.0000 mL | Status: DC
  Administered 2022-11-09 – 2022-11-10 (×7): 100 mL

## 2022-11-09 MED ORDER — POLYETHYLENE GLYCOL 3350 17 G PO PACK: 17.0000 g | PACK | Freq: Every day | ORAL | Status: DC | PRN

## 2022-11-09 MED ORDER — DOCUSATE SODIUM 50 MG/5ML PO LIQD: 100.0000 mg | Freq: Two times a day (BID) | ORAL | Status: DC | PRN

## 2022-11-09 MED ORDER — INSULIN ASPART 100 UNIT/ML IJ SOLN
0.0000 [IU] | INTRAMUSCULAR | Status: DC
  Administered 2022-11-09: 7 [IU] via SUBCUTANEOUS
  Administered 2022-11-09: 11 [IU] via SUBCUTANEOUS
  Administered 2022-11-09: 4 [IU] via SUBCUTANEOUS
  Administered 2022-11-10 (×2): 7 [IU] via SUBCUTANEOUS
  Administered 2022-11-10: 4 [IU] via SUBCUTANEOUS
  Administered 2022-11-10: 3 [IU] via SUBCUTANEOUS
  Administered 2022-11-10: 7 [IU] via SUBCUTANEOUS
  Administered 2022-11-10: 11 [IU] via SUBCUTANEOUS
  Administered 2022-11-11 (×3): 4 [IU] via SUBCUTANEOUS
  Administered 2022-11-11: 3 [IU] via SUBCUTANEOUS
  Administered 2022-11-12: 4 [IU] via SUBCUTANEOUS
  Administered 2022-11-12: 3 [IU] via SUBCUTANEOUS
  Administered 2022-11-12: 7 [IU] via SUBCUTANEOUS
  Administered 2022-11-12: 4 [IU] via SUBCUTANEOUS
  Administered 2022-11-12: 3 [IU] via SUBCUTANEOUS
  Administered 2022-11-13: 7 [IU] via SUBCUTANEOUS
  Administered 2022-11-13: 3 [IU] via SUBCUTANEOUS
  Administered 2022-11-13: 4 [IU] via SUBCUTANEOUS
  Administered 2022-11-13: 3 [IU] via SUBCUTANEOUS
  Administered 2022-11-13 (×2): 4 [IU] via SUBCUTANEOUS
  Administered 2022-11-14 (×3): 3 [IU] via SUBCUTANEOUS

## 2022-11-09 MED ORDER — ACETAMINOPHEN 325 MG PO TABS
650.0000 mg | ORAL_TABLET | Freq: Four times a day (QID) | ORAL | Status: DC
  Administered 2022-11-09 – 2022-11-13 (×16): 650 mg
  Filled 2022-11-09 (×17): qty 2

## 2022-11-09 MED ORDER — INSULIN GLARGINE-YFGN 100 UNIT/ML ~~LOC~~ SOLN
45.0000 [IU] | Freq: Two times a day (BID) | SUBCUTANEOUS | Status: DC
  Administered 2022-11-09 – 2022-11-10 (×2): 45 [IU] via SUBCUTANEOUS
  Filled 2022-11-09 (×3): qty 0.45

## 2022-11-09 MED ORDER — HYDRALAZINE HCL 50 MG PO TABS
25.0000 mg | ORAL_TABLET | Freq: Four times a day (QID) | ORAL | Status: DC
  Administered 2022-11-09 – 2022-11-13 (×17): 25 mg
  Filled 2022-11-09 (×18): qty 1

## 2022-11-09 MED ORDER — OXYCODONE HCL 5 MG PO TABS
10.0000 mg | ORAL_TABLET | Freq: Four times a day (QID) | ORAL | Status: DC
  Administered 2022-11-09 – 2022-11-10 (×4): 10 mg
  Filled 2022-11-09 (×4): qty 2

## 2022-11-09 MED ORDER — QUETIAPINE FUMARATE 25 MG PO TABS
75.0000 mg | ORAL_TABLET | Freq: Every day | ORAL | Status: DC
  Administered 2022-11-09 – 2022-11-24 (×16): 75 mg via ORAL
  Filled 2022-11-09 (×2): qty 3
  Filled 2022-11-09: qty 1
  Filled 2022-11-09 (×6): qty 3
  Filled 2022-11-09: qty 1
  Filled 2022-11-09: qty 3
  Filled 2022-11-09: qty 1
  Filled 2022-11-09 (×3): qty 3
  Filled 2022-11-09: qty 1

## 2022-11-09 MED ORDER — AMLODIPINE BESYLATE 10 MG PO TABS
10.0000 mg | ORAL_TABLET | Freq: Every day | ORAL | Status: DC
  Administered 2022-11-09 – 2022-11-13 (×5): 10 mg
  Filled 2022-11-09 (×5): qty 1

## 2022-11-09 NOTE — Progress Notes (Incomplete)
Attending note: I have seen and examined the patient. History, labs and imaging reviewed.  37 Y/O with history of obesity, diabetes presenting with Fournier gangrene, HHS, afib with RVR Remains on vent and cardene drip  Blood pressure (!) 171/71, pulse 97, temperature 98.8 F (37.1 C), temperature source Oral, resp. rate (!) 34, height 5\' 6"  (1.676 m), weight (!) 214 kg, SpO2 90 %. Gen:      No acute distress HEENT:  EOMI, sclera anicteric Neck:     No masses; no thyromegaly Lungs:    Clear to auscultation bilaterally; normal respiratory effort*** CV:         Regular rate and rhythm; no murmurs Abd:      + bowel sounds; soft, non-tender; no palpable masses, no distension Ext:    No edema; adequate peripheral perfusion Skin:      Warm and dry; no rash Neuro: alert and oriented x 3 Psych: normal mood and affect   Labs/Imaging personally reviewed, significant for Na 151  Assessment/plan: Fournier gangrene of perineum and gluteal region Strep mitis,oralis in wound culture Unasysn Continue wound care   Hyperglycemia Continue insulin drip. Transition to SQ   Hypertension Continue cardene drip. Hold lasix Increase hydralazine dose Add amlodipine   Resp failure  PSV weans as tolerated   Hypernatremia Free water, hold lasix  The patient is critically ill with multiple organ systems failure and requires high complexity decision making for assessment and support, frequent evaluation and titration of therapies, application of advanced monitoring technologies and extensive interpretation of multiple databases.  Critical care time - 35 mins. This represents my time independent of the NPs time taking care of the pt.  Marshell Garfinkel MD [facility] 11/09/2022, 10:21 AM

## 2022-11-09 NOTE — Progress Notes (Signed)
NAME:  Lindsey [name], MRN:  [id number], DOB:  [birth year], LOS: 8 ADMISSION DATE:  11/18/2022, CONSULTATION DATE:  10/27/2022 REFERRING MD:  Dr. Rubin Payor , CHIEF COMPLAINT:  HHS   History of Present Illness:  Lindsey [name] is a 37 y.o. female who has a PMH as below including DM2. She presented to [facility] 1/9 with fatigue, dyspnea, buttock wounds that had been getting worse. First noticed buttock wound around thanksgiving but it healed on its own. Then recurred around new years and has persisted and worsened to the point of malodorous drainage. She has also had fatigue and dyspnea. She apparently had been eating badly around the holidays and her CBG's had been reading high. Normally she states that CBG's are well controlled.   In ED, she was found to have HHS. 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   While at [facility], she also developed A.Fib RVR for which she received Amiodarone bolus x 2 followed by infusion. She was given one dose of Flagyl and was transferred to [facility] ED for surgical evaluation. En route to [facility], she converted to NSR. BP has been stable since ED presentation.   She was evaluated by surgery and is being taken to the OR for debridement tonight. She is currently on Insulin infusion, Amiodarone infusion, potassium supplementation. Her mother is with her at the bedside.  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   Significant Hospital Events: Including procedures, antibiotic start and stop dates in addition to other pertinent events   CT abdomen pelvis 1/9 > basilar atelectasis, normal stomach, appendix, bowel wall, no hernia, mild to moderate subcutaneous impaired muscular inflammatory fat stranding anterolateral aspect abdominal wall on the left, no ascites.  Soft tissue gas along the perineum on the right with medial and posterior medial right gluteal involvement consistent with Fournier's gangrene OR for debridement 1/9, 1/11, 12-Nov-2022 Blood cultures 1/9 >  Urine culture 1/9 > yeast 70 K Right buttock wound culture 1/10 > GPC, GNR > abundant group B strep, abundant strep mitis PICC placed 1/12  Interim History / Subjective:  Patient is intubated.  She appears uncomfortable and diaphoretic.   Objective   Blood pressure (!) 165/65, pulse 95, temperature 98.8 F (37.1 C), temperature source Oral, resp. rate (!) 33, height 5\' 6"  (1.676 m), weight (!) 214 kg, SpO2 94 %. CVP:  [6 mmHg-18 mmHg] 15 mmHg  Vent Mode: PRVC FiO2 (%):  [40 %-50 %] 40 % Set Rate:  [28 bmp] 28 bmp Vt Set:  [480 mL] 480 mL PEEP:  [8 cmH20-10 cmH20] 8 cmH20 Pressure Support:  [10 cmH20] 10 cmH20 Plateau Pressure:  [22 cmH20-27 cmH20] 26 cmH20   Intake/Output Summary (Last 24 hours) at 11/09/2022 0748 Last data filed at 11/09/2022 0600 Gross per 24 hour  Intake 3812.14 ml  Output 4970 ml  Net -1157.86 ml    Filed Weights   10/31/2022 0507 11/08/22 0545 11/09/22 0337  Weight: (!) 217 kg (!) 214 kg (!) 214 kg  Examination:  Physical Exam Constitutional:      General: She is in acute distress.     Appearance: She is ill-appearing.     Comments: Somnolent but awakes to verbal stimulation.  HENT:     Mouth/Throat:     Comments: ET tube in place. Eyes:     General:        Right eye: No discharge.        Left eye: No discharge.   Cardiovascular:     Rate and Rhythm: Normal rate and regular rhythm.  Pulmonary:     Effort: Pulmonary effort is normal. No respiratory distress.  Abdominal:     General: Bowel sounds are normal.     Palpations: Abdomen is soft.  Musculoskeletal:        General: Normal range of motion.     Right lower leg: Edema present.     Left lower leg: Edema present.     Comments: Trace edema of bilat LE  Skin:    General: Skin is warm.  Neurological:     Comments: Patient can follow commands.  Able to move all extremities.      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   Wound culture with group B strep, nontoxin producing, strep mitis -Greatly appreciate surgery management.  S/p debridement on 1/9, 1/11, 06-Nov-2022 and 1/15.  Currently getting dressing change with surgery. If there is no longer need for surgery, will attempt extubation as early as today. She was able to tolerate CPAP mode all day yesterday -Continue Unasyn -Scheduled Percocet and fentanyl as needed for pain control  Severe uncontrolled hypertension A-line was removed yesterday due to inaccurate readings. Currently measured on right wrist which is relatively resembled her A-line values.  She is now on low-dose of Cardene drip.  Will increase her p.o. hydralazine to 25 mg Q6h to hopefully wean her off the Cardene drip.  Type 2 diabetes A1C 14.7 -Home glimepiride and pioglitazone are on hold -Continue insulin drip due to high requirement of insulin.  Goal CBG less than 180 for wound healing.  Acute metabolic encephalopathy  Initially due to HHS, acute infection with septic shock.  CT head was negative for any acute intracranial abnormality.  Her mentation is improving.  The driving force right now is her severe infection and Fournier gangrene. -Continue holding all sedation.   Acute respiratory failure due to acute encephalopathy from  infection 2/2 for near gangrene, also possible contribution of evolving bilateral pulmonary infiltrates with total body volume overload, pulmonary edema Patient tolerates CPAP mode all day yesterday. -Patient has great output 5L from overnight. She is still +13L.  I will hold off on IV Lasix today given her worsening hypernatremia.  Hopefully we get her off the Cardene drip to avoid volume overload.  Hypernatremia Calculated 8L free water deficit Increase free water to 100 cc Q4h   Hx Hypothyroidism. -Continue Home levothyroxine dose 75 mcg daily   Hx Anxiety, Depression, Panic disorder, PTSD. -Appreciate pharmacy assistance, clarified home regimen.  She is no longer on most of her outpatient meds, only on quetiapine at night.  Holding for now   Idiopathic intracranial hypertension -Has been managed with Diamox as an outpatient, continue to hold for now  Best Practice (right click and "Reselect all SmartList Selections" daily)   Diet/type: tube feed DVT prophylaxis: prophylactic heparin  GI prophylaxis: PPI Lines: NA Foley:  Yes, and  it is still needed because of Fournier gangrene Code Status:  full code Last date of multidisciplinary goals of care discussion [1/17. Updated mother at bedside]  Labs   CBC: Recent Labs  Lab 11-20-2022 2236 11/06/22 1931 10/31/2022 0045 11/20/2022 0328 10/26/2022 0915 11/08/22 0317 11/09/22 0311  WBC 25.2*  --  35.9* 34.9*  --  28.6* 22.8*  HGB 6.2*   < > 8.4* 8.1* 14.3 7.5* 9.7*  HCT 18.4*   < > 26.5* 25.8* 42.0 23.4* 30.1*  MCV 85.6  --  89.8 90.2  --  89.0 90.7  PLT 166  --  187 204  --  205 192   < > = values in this interval not displayed.     Basic Metabolic Panel: Recent Labs  Lab 10/31/2022 0313 10/24/2022 1014 11/04/22 0505 11/04/22 0836 11/20/2022 0344 2022/11/20 1814 2022-11-20 1836 11/06/22 0050 10/31/2022 0045 10/27/2022 0328 11/13/2022 0915 11/08/22 0317 11/09/22 0311  NA  --    < > 132*   < > 134* 134*   < > 136 139 141 147* 146*  151*  K  --    < > 3.2*   < > 4.3 4.5   < > 4.9 3.6 3.8 3.9 3.7 3.9  CL  --    < > 97*   < > 99 101  --  105 107 105  --  110 114*  CO2  --    < > 24   < > 23 23  --  23 23 24   --  28 30  GLUCOSE  --    < > 148*   < > 207* 240*  --  222* 335* 333*  --  182* 158*  BUN  --    < > 45*   < > 51* 61*  --  65* 71* 66*  --  49* 32*  CREATININE  --    < > 1.13*   < > 1.41* 1.32*  --  1.44* 1.19* 1.19*  --  0.81 0.70  CALCIUM  --    < > 6.5*   < > 7.0* 7.2*  --  7.5* 7.6* 7.6*  --  8.3* 8.1*  MG 2.7*  --  2.6*  --  2.9* 3.0*  --   --  2.9* 3.0*  --   --   --   PHOS 3.9  --  2.4*  --   --   --   --   --  3.6 3.6  --   --   --    < > = values in this interval not displayed.    GFR: Estimated Creatinine Clearance: 186 mL/min (by C-G formula based on SCr of 0.7 mg/dL). Recent Labs  Lab 11/02/22 0930 11/21/2022 0313 11/23/2022 0315 11/04/22 0505 11/14/2022 0045 11/08/2022 0328 11/08/22 0317 11/09/22 0311  WBC  --    < >  --    < > 35.9* 34.9* 28.6* 22.8*  LATICACIDVEN 1.6  --  1.0  --   --   --   --   --    < > = values in this interval not displayed.     Liver Function Tests: Recent Labs  Lab 11/09/2022 0000 11/06/22 0050  AST 29 19  ALT 26 19  ALKPHOS 85 83  BILITOT 0.9 1.4*  PROT 5.7* 5.7*  ALBUMIN 1.5* <1.5*    No results for input(s): "LIPASE", "AMYLASE" in the last 168 hours. No results for input(s): "AMMONIA" in the last  168 hours.  ABG    Component Value Date/Time   PHART 7.343 (L) 11/08/2022 0915   PCO2ART 50.0 (H) 11/19/2022 0915   PO2ART 95 11/16/2022 0915   HCO3 27.4 11/16/2022 0915   TCO2 29 11/13/2022 0915   ACIDBASEDEF 1.0 11/20/2022 1836   O2SAT 97 10/31/2022 0915     Coagulation Profile: No results for input(s): "INR", "PROTIME" in the last 168 hours.  Cardiac Enzymes: No results for input(s): "CKTOTAL", "CKMB", "CKMBINDEX", "TROPONINI" in the last 168 hours.  HbA1C: Hgb A1c MFr Bld  Date/Time Value Ref Range Status  11/16/2022 10:42 PM 14.7 (H) 4.8 - 5.6  % Final    Comment:    (NOTE) Pre diabetes:          5.7%-6.4%  Diabetes:              >6.4%  Glycemic control for   <7.0% adults with diabetes     CBG: Recent Labs  Lab 11/08/22 2121 11/08/22 2321 11/09/22 0149 11/09/22 0339 11/09/22 0653  GLUCAP 176* 169* 177* 157* 153*     Review of Systems:     Past Medical History:  She,  has a past medical history of Anxiety and depression, Depression (02/22/2015), Diabetes ([place]), Headache(784.0), High cholesterol, Hypercholesteremia, Hyperlipidemia, Hypertension, Hypothyroidism, Obesity, Panic disorder, Polycystic ovary, Pseudotumor cerebri, and PTSD (post-traumatic stress disorder).   Surgical History:   Past Surgical History:  Procedure Laterality Date   INCISION AND DRAINAGE PERIRECTAL ABSCESS N/A 11/02/2022   Procedure: EXCISIONAL DEBRIDEMENT PERINEAL WOUND;  Surgeon: Ralene Ok, MD;  Location: [place];  Service: General;  Laterality: N/A;   IRRIGATION AND DEBRIDEMENT ABSCESS N/A Nov 27, 2022   Procedure: EXCISIONAL DEBRIDEMENT PERINEAL WOUND;  Surgeon: Rolm Bookbinder, MD;  Location: [place];  Service: General;  Laterality: N/A;   IRRIGATION AND DEBRIDEMENT ABSCESS N/A 11/06/2022   Procedure: IRRIGATION AND EXCISIONAL DEBRIDEMENT PERINEAL WOUND;  Surgeon: Coralie Keens, MD;  Location: [place];  Service: General;  Laterality: N/A;   IRRIGATION AND DEBRIDEMENT BUTTOCKS Right 10/31/2022   Procedure: IRRIGATION AND DEBRIDEMENT BUTTOCKS;  Surgeon: Ralene Ok, MD;  Location: [place];  Service: General;  Laterality: Right;   LUMBAR PUNCTURE     WOUND DEBRIDEMENT N/A 11/27/2022   Procedure: EXCISIONAL DEBRIDEMENT OF BUTTOCK;  Surgeon: Rolm Bookbinder, MD;  Location: [place];  Service: General;  Laterality: N/A;   WOUND DEBRIDEMENT N/A 11/23/2022   Procedure: DEBRIDEMENT  BUTTOCK WOUND AND DRESSING CHANGE UNDER ANESTHESIA;  Surgeon: Coralie Keens, MD;  Location: [place];  Service: General;  Laterality: N/A;     Social History:    reports that she has quit smoking. Her smoking use included cigarettes. She smoked an average of .25 packs per day. She has never used smokeless tobacco. She reports current drug use. Drug: Marijuana. She reports that she does not drink alcohol.   Family History:  Her family history includes Breast cancer in her maternal grandmother; Cancer in an other family member; Coronary artery disease in her mother and another family member; Diabetes in an other family member; Heart failure in her maternal grandfather; Kidney disease in her father; Narcolepsy in her maternal grandmother; Polycystic ovary syndrome in her mother.   Allergies Allergies  Allergen Reactions   Bupropion Other (See Comments), Palpitations and Shortness Of Breath   Anti-Inflammatory Enzyme [Nutritional Supplements] Swelling    And fluid on the brain   Hydroxyzine Other (See Comments)   Metformin Nausea Only and Other (See Comments)   Nsaids Other (See Comments), Hypertension and  Swelling   Rosuvastatin Nausea And Vomiting and Other (See Comments)     Home Medications  Prior to Admission medications   Medication Sig Start Date End Date Taking? Authorizing Provider  acetaZOLAMIDE ER (DIAMOX) 500 MG capsule Take 1 capsule (500 mg total) by mouth in the morning, at noon, and at bedtime. 06/21/21 11/02/23 Yes Camara, Amalia Hailey, MD  glimepiride (AMARYL) 2 MG tablet Take 2 mg by mouth daily. 05/29/21  Yes [provider]  hydrOXYzine (ATARAX) 25 MG tablet Take 25 mg by mouth 3 (three) times daily as needed for anxiety (first line).   Yes [provider]  levothyroxine (SYNTHROID) 75 MCG tablet Take 75 mcg by mouth daily. 06/02/21  Yes [provider]  pioglitazone (ACTOS) 15 MG tablet Take 45 mg by mouth daily. 02/18/21  Yes [provider]  pravastatin (PRAVACHOL) 20 MG tablet Take 20 mg by mouth once a week. Monday 05/29/21  Yes [provider]  QUEtiapine (SEROQUEL) 50 MG tablet Take 75 mg by  mouth at bedtime. 01/17/22  Yes [provider]  escitalopram (LEXAPRO) 20 MG tablet Take 20 mg by mouth daily. Patient not taking: Reported on 11/23/2022    [provider]  irbesartan (AVAPRO) 300 MG tablet Take 300 mg by mouth daily. Patient not taking: Reported on 11/20/2022 05/31/21   [provider]  LORazepam (ATIVAN) 1 MG tablet Take 1 mg by mouth daily as needed for anxiety (second option). 02/11/22   [provider]  propranolol (INDERAL) 10 MG tablet Take 1 tablet (10 mg total) by mouth daily. Patient taking differently: Take 20 mg by mouth 2 (two) times daily. 03/28/22 11/02/23  Oneta Rack, NP     Critical care time: 25 min   Doran Stabler, DO Internal Medicine Residency My pager: [phone]

## 2022-11-09 NOTE — Progress Notes (Addendum)
[facility] Progress Note  2 Days Post-Op  Subjective: CC:  On the vent. RN staff at bedside for dressing change  Objective: Vital signs in last 24 hours: Temp:  [97.2 F (36.2 C)-99 F (37.2 C)] 98.8 F (37.1 C) (01/17 0725) Pulse Rate:  [83-111] 97 (01/17 0815) Resp:  [0-38] 38 (01/17 0815) BP: (109-195)/(43-145) 161/67 (01/17 0815) SpO2:  [89 %-100 %] 94 % (01/17 0815) Arterial Line BP: (83-204)/(54-84) 117/77 (01/16 1500) FiO2 (%):  [40 %-50 %] 40 % (01/17 0400) Weight:  [546 kg] 214 kg (01/17 0337) Last BM Date : 11/08/22  Intake/Output from previous day: 01/16 0701 - 01/17 0700 In: 3947.6 [I.V.:1438.3; NG/GT:2074; IV Piggyback:400.3] Out: 4970 [Urine:4150; Stool:820] Intake/Output this shift: Total I/O In: 310.8 [I.V.:110.8; NG/GT:200] Out: 300 [Urine:300]  PE: Gen:  intubated, sedated, responding to pain Card:  Regular rate and rhythm Pulm: ventilated respirations Abd: Soft, obese, non-tender GU: indwelling foley (UOP - 4.1 L)  and rectal tube in place; dressings removed and they had green drainage consistent with pseudomonas Large right buttock wound as below ~ 30x15x10cm, 4cm undermining at the top of the gluteal cleft cephalad, 4 cm undermining at right superior and lateral portion of wound, inferior aspect of the wound tunnels 8 am anteriorly.  Tissue is pale but there is no significant necrosis, some fibrinous exudate. Periwound is soft without crepitus   Perineal and right labial wound as below, communicating with buttock wound ~ 20x12x8cm, undermines 10 cm cephalad, important to note the space underneath the labia as well while packing.  Skin: warm and dry, no rashes  Psych: A&Ox3   Lab Results:  Recent Labs    11/08/22 0317 11/09/22 0311  WBC 28.6* 22.8*  HGB 7.5* 9.7*  HCT 23.4* 30.1*  PLT 205 192   BMET Recent Labs    11/08/22 0317 11/09/22 0311  NA 146* 151*  K 3.7 3.9  CL 110 114*  CO2 28 30  GLUCOSE 182* 158*  BUN 49*  32*  CREATININE 0.81 0.70  CALCIUM 8.3* 8.1*   PT/INR No results for input(s): "LABPROT", "INR" in the last 72 hours. CMP     Component Value Date/Time   NA 151 (H) 11/09/2022 0311   K 3.9 11/09/2022 0311   CL 114 (H) 11/09/2022 0311   CO2 30 11/09/2022 0311   GLUCOSE 158 (H) 11/09/2022 0311   BUN 32 (H) 11/09/2022 0311   CREATININE 0.70 11/09/2022 0311   CALCIUM 8.1 (L) 11/09/2022 0311   PROT 5.7 (L) 11/06/2022 0050   ALBUMIN <1.5 (L) 11/06/2022 0050   AST 19 11/06/2022 0050   ALT 19 11/06/2022 0050   ALKPHOS 83 11/06/2022 0050   BILITOT 1.4 (H) 11/06/2022 0050   GFRNONAA >60 11/09/2022 0311   GFRAA >60 07/03/2016 0004   Lipase  No results found for: "LIPASE"     Studies/Results: DG Abd Portable 1V  Result Date: 11/23/2022 CLINICAL DATA:  Feeding tube placement EXAM: PORTABLE ABDOMEN - 1 VIEW COMPARISON:  11/02/2022 FINDINGS: The feeding tube tip is in the stomach antrum. Previous nasogastric tube is been removed. Gas is visible in the transverse colon. No dilated upper abdominal bowel observed. IMPRESSION: 1. Feeding tube tip is in the stomach antrum. Electronically Signed   By: Van Clines M.D.   On: 11/02/2022 10:21    Anti-infectives: Anti-infectives (From admission, onward)    Start     Dose/Rate Route Frequency Ordered Stop   10/31/2022 1630  Ampicillin-Sulbactam (UNASYN) 3 g in sodium  chloride 0.9 % 100 mL IVPB        3 g 200 mL/hr over 30 Minutes Intravenous Every 6 hours 11/11/2022 1051     November 04, 2022 1600  piperacillin-tazobactam (ZOSYN) IVPB 3.375 g  Status:  Discontinued        3.375 g 12.5 mL/hr over 240 Minutes Intravenous Every 8 hours 11/04/22 1459 11/11/2022 1031   11/02/22 1400  linezolid (ZYVOX) IVPB 600 mg  Status:  Discontinued        600 mg 300 mL/hr over 60 Minutes Intravenous Every 12 hours 11/02/22 1222 11/04/22 0830   11/02/22 1000  vancomycin (VANCOREADY) IVPB 1250 mg/250 mL  Status:  Discontinued        1,250 mg 166.7 mL/hr over 90  Minutes Intravenous Every 12 hours 11/16/2022 2135 10/31/2022 2137   11/02/22 1000  vancomycin (VANCOREADY) IVPB 1500 mg/300 mL  Status:  Discontinued        1,500 mg 150 mL/hr over 120 Minutes Intravenous Every 12 hours 11/17/2022 2137 11/02/22 1222   11/02/22 0300  ceFEPIme (MAXIPIME) 2 g in sodium chloride 0.9 % 100 mL IVPB  Status:  Discontinued        2 g 200 mL/hr over 30 Minutes Intravenous Every 8 hours 11/15/2022 2135 10/29/2022 2258   11/02/22 0045  vancomycin (VANCOREADY) IVPB 2000 mg/400 mL        2,000 mg 200 mL/hr over 120 Minutes Intravenous  Once 11/02/22 0030 11/02/22 0305   11/21/2022 2315  piperacillin-tazobactam (ZOSYN) IVPB 3.375 g  Status:  Discontinued        3.375 g 12.5 mL/hr over 240 Minutes Intravenous Every 8 hours 11/08/2022 2306 11/04/2022 1459   11/02/2022 2315  clindamycin (CLEOCIN) IVPB 900 mg  Status:  Discontinued        900 mg 100 mL/hr over 30 Minutes Intravenous Every 8 hours 10/27/2022 2306 11/02/22 1222   11/22/2022 1930  vancomycin (VANCOCIN) IVPB 1000 mg/200 mL premix  Status:  Discontinued       See Hyperspace for full Linked Orders Report.   1,000 mg 200 mL/hr over 60 Minutes Intravenous  Once 10/31/2022 1823 11/02/22 0029   11/22/2022 1830  ceFEPIme (MAXIPIME) 2 g in sodium chloride 0.9 % 100 mL IVPB        2 g 200 mL/hr over 30 Minutes Intravenous  Once 10/24/2022 1819 11/15/2022 2016   11/21/2022 1830  metroNIDAZOLE (FLAGYL) IVPB 500 mg        500 mg 100 mL/hr over 60 Minutes Intravenous  Once 10/27/2022 1819 11/11/2022 2130   10/29/2022 1830  vancomycin (VANCOCIN) IVPB 1000 mg/200 mL premix  Status:  Discontinued        1,000 mg 200 mL/hr over 60 Minutes Intravenous  Once 11/17/2022 1819 10/27/2022 1823   11/09/2022 1830  vancomycin (VANCOCIN) IVPB 1000 mg/200 mL premix  Status:  Discontinued       See Hyperspace for full Linked Orders Report.   1,000 mg 200 mL/hr over 60 Minutes Intravenous  Once 11/06/2022 1823 11/02/22 0029        Assessment/Plan  S/p debridement of  buttock NSTI  10/31/2022 Dr. Rosendo Gros  S/p excisional debridement buttock and perineal wound Nov 04, 2022 Dr. Donne Hazel S/p excisional debridement perineal wound 11/11/2022 Dr. Rosendo Gros S/P excisional debridement perineal wound and buttock wound 11/02/2022 Dr. Ninfa Linden - dressing changed today while pt on ventilator - required 4 mg versed, 300 mcg fentanyl.  - will discuss future wound care with MD today, would benefit from daily dressing changes but,  after extubation, would be concerned that she would only tolerate daily wound care under conscious sedation. - will add dakins solution for green drainage consistent with pseudomonas.  - cont abx therapy, Cx - pansensitive GBS and strep mitis/oralis - ideally this patient would get a diverting colostomy to keep stool out of her wound but I think her body habitus precludes this.   FEN - NPO on vent, tube feeds VTE - SQH ID - Clindamycin, Zosyn, Vanc; narrowed to unasyn 1/15>>   - below per CCM -  Septic shock - secondary to above, now off pressors.  Appreciate CCM's care of this patient as she is critically ill ARF - cr 0.8, improved DM - on insulin gtt  Morbid obesity - BMI 72 Hypothryoidism PCOS HLD Anxiety/Depression      LOS: 8 days   I reviewed nursing notes, hospitalist notes, last 24 h vitals and pain scores, last 48 h intake and output, last 24 h labs and trends, and last 24 h imaging results.    Hosie Spangle, PA-C [facility] Please see Amion for pager number during day hours 7:00am-4:30pm

## 2022-11-09 NOTE — Progress Notes (Signed)
eLink Physician-Brief Progress Note Patient Name: Lindsey [name] DOB: 08/06/1986 MRN: [id number]   Date of Service  11/09/2022  HPI/Events of Note  Serum sodium 153, BNP  175.7  eICU Interventions  No indication for iv Lasix dose, will  get a stat portable CXR to r/o other pulmonary etiology of crackles.         [name] 11/09/2022, 10:17 PM

## 2022-11-09 NOTE — Progress Notes (Signed)
[place] Progress Note Patient Name: Lindsey [name] DOB: 1986-09-18 MRN: [id number]   Date of Service  11/09/2022  HPI/Events of Note  Serum sodium 152, patient put out 3 liters in the past 24 hours in response to Lasix 40 mg.  eICU Interventions  Will order 100 ml of free water Q 4 hours x 6 doses.        Kerry Kass Sandro Burgo 11/09/2022, 6:23 AM

## 2022-11-09 NOTE — Progress Notes (Signed)
[place] Progress Note Patient Name: Aleksis Jiggetts DOB: 09-10-1986 MRN: [id number]   Date of Service  11/09/2022  HPI/Events of Note  Sub-optimal sedation on the ventilator, patient also sounds crackly per bedside RN, Lasix was held earlier today secondary to hypernatremia.  eICU Interventions  Precedex added to optimize sedation without respiratory depression, BMP and BNP ordered to gauge serum sodium and volume status respectively.        Kerry Kass Merrianne Mccumbers 11/09/2022, 8:19 PM

## 2022-11-09 NOTE — Progress Notes (Signed)
Inpatient Diabetes Program Recommendations  AACE/ADA: New Consensus Statement on Inpatient Glycemic Control (2015)  Target Ranges:  Prepandial:   less than 140 mg/dL      Peak postprandial:   less than 180 mg/dL (1-2 hours)      Critically ill patients:  140 - 180 mg/dL   Lab Results  Component Value Date   GLUCAP 149 (H) 11/09/2022   HGBA1C 14.7 (H) 10/27/2022    Review of Glycemic Control  Latest Reference Range & Units 11/09/22 08:48 11/09/22 10:46 11/09/22 12:42  Glucose-Capillary 70 - 99 mg/dL 147 (H) 176 (H) 149 (H)  Diabetes history: DM  Outpatient Diabetes medications: Actos 45 mg daily, Amaryl 2 mg daily  Current orders for Inpatient glycemic control:  IV insulin- Vital 75 ml/hr Inpatient Diabetes Program Recommendations:  Note plan to transition to SQ insulin.  Past 8 hour insulin requirements have been approximately 74 units (estimate 222 units/24 hours). MD has ordered transition to Semglee 45 units bid and Novolog 12 units q 4 hours.  Will follow.    Thanks,  Adah Perl, RN, BC-ADM Inpatient Diabetes Coordinator Pager [phone]  (8a-5p)

## 2022-11-10 ENCOUNTER — Inpatient Hospital Stay (HOSPITAL_COMMUNITY): Payer: Medicaid Other

## 2022-11-10 ENCOUNTER — Inpatient Hospital Stay: Payer: Self-pay

## 2022-11-10 LAB — CBC
HCT: 21.6 % — ABNORMAL LOW (ref 36.0–46.0)
HCT: 26.4 % — ABNORMAL LOW (ref 36.0–46.0)
Hemoglobin: 6.6 g/dL — CL (ref 12.0–15.0)
Hemoglobin: 8.1 g/dL — ABNORMAL LOW (ref 12.0–15.0)
MCH: 29.3 pg (ref 26.0–34.0)
MCH: 28.9 pg (ref 26.0–34.0)
MCHC: 30.6 g/dL (ref 30.0–36.0)
MCHC: 30.7 g/dL (ref 30.0–36.0)
MCV: 96 fL (ref 80.0–100.0)
MCV: 94.3 fL (ref 80.0–100.0)
Platelets: 200 10*3/uL (ref 150–400)
Platelets: 175 10*3/uL (ref 150–400)
RBC: 2.25 MIL/uL — ABNORMAL LOW (ref 3.87–5.11)
RBC: 2.8 MIL/uL — ABNORMAL LOW (ref 3.87–5.11)
RDW: 17.8 % — ABNORMAL HIGH (ref 11.5–15.5)
RDW: 17.4 % — ABNORMAL HIGH (ref 11.5–15.5)
WBC: 22 10*3/uL — ABNORMAL HIGH (ref 4.0–10.5)
WBC: 22.4 10*3/uL — ABNORMAL HIGH (ref 4.0–10.5)
nRBC: 1.8 % — ABNORMAL HIGH (ref 0.0–0.2)
nRBC: 1.7 % — ABNORMAL HIGH (ref 0.0–0.2)

## 2022-11-10 LAB — GLUCOSE, CAPILLARY
Glucose-Capillary: 227 mg/dL — ABNORMAL HIGH (ref 70–99)
Glucose-Capillary: 173 mg/dL — ABNORMAL HIGH (ref 70–99)
Glucose-Capillary: 164 mg/dL — ABNORMAL HIGH (ref 70–99)
Glucose-Capillary: 209 mg/dL — ABNORMAL HIGH (ref 70–99)
Glucose-Capillary: 220 mg/dL — ABNORMAL HIGH (ref 70–99)
Glucose-Capillary: 146 mg/dL — ABNORMAL HIGH (ref 70–99)

## 2022-11-10 LAB — BASIC METABOLIC PANEL
Anion gap: 8 (ref 5–15)
BUN: 29 mg/dL — ABNORMAL HIGH (ref 6–20)
CO2: 31 mmol/L (ref 22–32)
Calcium: 8 mg/dL — ABNORMAL LOW (ref 8.9–10.3)
Chloride: 114 mmol/L — ABNORMAL HIGH (ref 98–111)
Creatinine, Ser: 0.73 mg/dL (ref 0.44–1.00)
GFR, Estimated: >60 mL/min (ref >60)
Glucose, Bld: 247 mg/dL — ABNORMAL HIGH (ref 70–99)
Potassium: 4.3 mmol/L (ref 3.5–5.1)
Sodium: 153 mmol/L — ABNORMAL HIGH (ref 135–145)

## 2022-11-10 LAB — PREPARE RBC (CROSSMATCH)

## 2022-11-10 MED ORDER — SODIUM CHLORIDE 0.9 % IV SOLN
3.0000 g | Freq: Four times a day (QID) | INTRAVENOUS | Status: AC
  Administered 2022-11-10 – 2022-11-13 (×14): 3 g via INTRAVENOUS
  Filled 2022-11-10 (×13): qty 8

## 2022-11-10 MED ORDER — SODIUM CHLORIDE 0.9 % IV SOLN
3.0000 g | Freq: Four times a day (QID) | INTRAVENOUS | Status: DC
  Filled 2022-11-10: qty 8

## 2022-11-10 MED ORDER — VITAL 1.5 CAL PO LIQD
1000.0000 mL | ORAL | Status: DC
  Administered 2022-11-10 – 2022-11-13 (×5): 1000 mL
  Filled 2022-11-10: qty 1000

## 2022-11-10 MED ORDER — ALTEPLASE 2 MG IJ SOLR
2.0000 mg | Freq: Once | INTRAMUSCULAR | Status: AC
  Administered 2022-11-10: 2 mg
  Filled 2022-11-10: qty 2

## 2022-11-10 MED ORDER — OXYCODONE HCL 5 MG PO TABS
10.0000 mg | ORAL_TABLET | ORAL | Status: DC
  Administered 2022-11-10 – 2022-11-11 (×6): 10 mg
  Filled 2022-11-10 (×6): qty 2

## 2022-11-10 MED ORDER — FENTANYL CITRATE PF 50 MCG/ML IJ SOSY
50.0000 ug | PREFILLED_SYRINGE | INTRAMUSCULAR | Status: DC | PRN
  Administered 2022-11-10 (×3): 100 ug via INTRAVENOUS
  Administered 2022-11-10 – 2022-11-12 (×2): 50 ug via INTRAVENOUS
  Filled 2022-11-10: qty 1

## 2022-11-10 MED ORDER — INSULIN GLARGINE-YFGN 100 UNIT/ML ~~LOC~~ SOLN
50.0000 [IU] | Freq: Two times a day (BID) | SUBCUTANEOUS | Status: DC
  Administered 2022-11-10 – 2022-11-11 (×2): 50 [IU] via SUBCUTANEOUS
  Filled 2022-11-10 (×3): qty 0.5

## 2022-11-10 MED ORDER — MIDAZOLAM HCL 2 MG/2ML IJ SOLN
2.0000 mg | Freq: Once | INTRAMUSCULAR | Status: AC
  Administered 2022-11-10: 2 mg via INTRAVENOUS

## 2022-11-10 MED ORDER — PROSOURCE TF20 ENFIT COMPATIBL EN LIQD
120.0000 mL | Freq: Every day | ENTERAL | Status: DC
  Administered 2022-11-10 – 2022-11-13 (×4): 120 mL
  Filled 2022-11-10 (×4): qty 120

## 2022-11-10 MED ORDER — DEXTROSE 10 % IV SOLN: INTRAVENOUS | Status: DC

## 2022-11-10 MED ORDER — SENNOSIDES-DOCUSATE SODIUM 8.6-50 MG PO TABS: 1.0000 | ORAL_TABLET | Freq: Every evening | ORAL | Status: DC | PRN

## 2022-11-10 MED ORDER — GABAPENTIN 600 MG PO TABS
300.0000 mg | ORAL_TABLET | Freq: Three times a day (TID) | ORAL | Status: DC
  Administered 2022-11-10 (×3): 300 mg
  Filled 2022-11-10 (×4): qty 0.5

## 2022-11-10 MED ORDER — MIDAZOLAM HCL 2 MG/2ML IJ SOLN
2.0000 mg | INTRAMUSCULAR | Status: DC | PRN
  Administered 2022-11-10: 2 mg via INTRAVENOUS
  Filled 2022-11-10 (×2): qty 2

## 2022-11-10 MED ORDER — INSULIN ASPART 100 UNIT/ML IJ SOLN
14.0000 [IU] | INTRAMUSCULAR | Status: DC
  Administered 2022-11-10 – 2022-11-11 (×5): 14 [IU] via SUBCUTANEOUS

## 2022-11-10 MED ORDER — SODIUM CHLORIDE 0.9% IV SOLUTION: Freq: Once | INTRAVENOUS | Status: DC

## 2022-11-10 MED ORDER — HYDROMORPHONE HCL 1 MG/ML IJ SOLN
1.0000 mg | INTRAMUSCULAR | Status: AC | PRN
  Administered 2022-11-10 (×2): 1 mg via INTRAVENOUS
  Filled 2022-11-10 (×2): qty 1

## 2022-11-10 MED ORDER — FREE WATER
200.0000 mL | Status: AC
  Administered 2022-11-10 – 2022-11-11 (×5): 200 mL

## 2022-11-10 MED ORDER — ALTEPLASE 2 MG IJ SOLR
2.0000 mg | Freq: Once | INTRAMUSCULAR | Status: DC
  Filled 2022-11-10 (×2): qty 2

## 2022-11-10 MED ORDER — MIDAZOLAM HCL 2 MG/2ML IJ SOLN
2.0000 mg | Freq: Once | INTRAMUSCULAR | Status: AC
  Administered 2022-11-10: 2 mg via INTRAVENOUS
  Filled 2022-11-10: qty 2

## 2022-11-10 NOTE — Progress Notes (Signed)
Inpatient Diabetes Program Recommendations  AACE/ADA: New Consensus Statement on Inpatient Glycemic Control (2015)  Target Ranges:  Prepandial:   less than 140 mg/dL      Peak postprandial:   less than 180 mg/dL (1-2 hours)      Critically ill patients:  140 - 180 mg/dL   Lab Results  Component Value Date   GLUCAP 173 (H) 11/10/2022   HGBA1C 14.7 (H) 11/10/2022    Review of Glycemic Control  Latest Reference Range & Units 11/09/22 23:24 11/10/22 03:16 11/10/22 07:37  Glucose-Capillary 70 - 99 mg/dL 255 (H) 227 (H) 173 (H)   Diabetes history: DM  Outpatient Diabetes medications: Actos 45 mg daily, Amaryl 2 mg daily  Current orders for Inpatient glycemic control:  Novolog 0-20 units q 4 hours Novolog 14 units q 4 hours (increased today) Vital 1.5 70 ml/hr Semglee 50 units bid (increased today)  Inpatient Diabetes Program Recommendations:    Note increases in [place] and Novolog today.  Will follow. Consider adding D10 prn for if tube feeds are held.   Thanks,   Adah Perl, RN, BC-ADM Inpatient Diabetes Coordinator Pager [phone]  (8a-5p)

## 2022-11-10 NOTE — Progress Notes (Signed)
[facility] Progress Note  3 Days Post-Op  Subjective: On the vent. RN staff at bedside for dressing change  Objective: Vital signs in last 24 hours: Temp:  [98.7 F (37.1 C)-99 F (37.2 C)] 98.7 F (37.1 C) (01/18 0920) Pulse Rate:  [66-105] 67 (01/18 0920) Resp:  [9-41] 26 (01/18 0920) BP: (97-172)/(45-78) 123/47 (01/18 0920) SpO2:  [90 %-100 %] 95 % (01/18 0920) FiO2 (%):  [40 %-50 %] 40 % (01/18 0920) Weight:  [216 kg] 216 kg (01/18 0343) Last BM Date : 11/09/22  Intake/Output from previous day: 01/17 0701 - 01/18 0700 In: 3816.1 [I.V.:956; NG/GT:2460; IV Piggyback:400.2] Out: 3230 [Urine:1950; Stool:1280] Intake/Output this shift: Total I/O In: 1254.3 [I.V.:86.8; Blood:767.5; NG/GT:400] Out: -   PE: Gen:  intubated, sedated, responding to pain Card:  Regular rate and rhythm Pulm: ventilated respirations Abd: Soft, obese, non-tender GU: indwelling foley (UOP - 1.9 L)  and rectal tube in place; dressings removed and they had green drainage consistent with pseudomonas Large right buttock wound as below  Tissue is pale but there is no significant necrosis, some fibrinous exudate. Periwound is soft without crepitus   Perineal and right labial wound as below, communicating with buttock wound   Skin: warm and dry, no rashes  Psych: A&Ox3   Lab Results:  Recent Labs    11/09/22 2026 11/10/22 0335  WBC 23.7* 22.0*  HGB 7.5* 6.6*  HCT 24.0* 21.6*  PLT 202 200    BMET Recent Labs    11/09/22 2026 11/10/22 0335  NA 153* 153*  K 4.1 4.3  CL 118* 114*  CO2 30 31  GLUCOSE 247* 247*  BUN 26* 29*  CREATININE 0.63 0.73  CALCIUM 7.8* 8.0*    PT/INR No results for input(s): "LABPROT", "INR" in the last 72 hours. CMP     Component Value Date/Time   NA 153 (H) 11/10/2022 0335   K 4.3 11/10/2022 0335   CL 114 (H) 11/10/2022 0335   CO2 31 11/10/2022 0335   GLUCOSE 247 (H) 11/10/2022 0335   BUN 29 (H) 11/10/2022 0335   CREATININE 0.73  11/10/2022 0335   CALCIUM 8.0 (L) 11/10/2022 0335   PROT 5.7 (L) 11/06/2022 0050   ALBUMIN <1.5 (L) 11/06/2022 0050   AST 19 11/06/2022 0050   ALT 19 11/06/2022 0050   ALKPHOS 83 11/06/2022 0050   BILITOT 1.4 (H) 11/06/2022 0050   GFRNONAA >60 11/10/2022 0335   GFRAA >60 07/03/2016 0004   Lipase  No results found for: "LIPASE"     Studies/Results: DG Chest Port 1 View  Result Date: 11/09/2022 CLINICAL DATA:  Acute respiratory failure, tachypnea. EXAM: PORTABLE CHEST 1 VIEW COMPARISON:  11-19-2022. FINDINGS: The heart is enlarged the mediastinal contour stable. Lung volumes are low with no consolidation, effusion, or pneumothorax. An enteric tube courses over the left upper quadrant and out of the field of view. The endotracheal tube terminates 3.5 cm above the carina. Right internal jugular central venous catheter is no longer seen. IMPRESSION: 1. Cardiomegaly. 2. Support apparatus as described above. Electronically Signed   By: Brett Fairy M.D.   On: 11/09/2022 22:59    Anti-infectives: Anti-infectives (From admission, onward)    Start     Dose/Rate Route Frequency Ordered Stop   Nov 19, 2022 1630  Ampicillin-Sulbactam (UNASYN) 3 g in sodium chloride 0.9 % 100 mL IVPB        3 g 200 mL/hr over 30 Minutes Intravenous Every 6 hours 11/19/22 1051  11/16/2022 1600  piperacillin-tazobactam (ZOSYN) IVPB 3.375 g  Status:  Discontinued        3.375 g 12.5 mL/hr over 240 Minutes Intravenous Every 8 hours 10/24/2022 1459 11/21/2022 1031   11/02/22 1400  linezolid (ZYVOX) IVPB 600 mg  Status:  Discontinued        600 mg 300 mL/hr over 60 Minutes Intravenous Every 12 hours 11/02/22 1222 11/04/22 0830   11/02/22 1000  vancomycin (VANCOREADY) IVPB 1250 mg/250 mL  Status:  Discontinued        1,250 mg 166.7 mL/hr over 90 Minutes Intravenous Every 12 hours 11/10/2022 2135 11/11/2022 2137   11/02/22 1000  vancomycin (VANCOREADY) IVPB 1500 mg/300 mL  Status:  Discontinued        1,500 mg 150 mL/hr over  120 Minutes Intravenous Every 12 hours 11/13/2022 2137 11/02/22 1222   11/02/22 0300  ceFEPIme (MAXIPIME) 2 g in sodium chloride 0.9 % 100 mL IVPB  Status:  Discontinued        2 g 200 mL/hr over 30 Minutes Intravenous Every 8 hours 10/30/2022 2135 11/12/2022 2258   11/02/22 0045  vancomycin (VANCOREADY) IVPB 2000 mg/400 mL        2,000 mg 200 mL/hr over 120 Minutes Intravenous  Once 11/02/22 0030 11/02/22 0305   11/19/2022 2315  piperacillin-tazobactam (ZOSYN) IVPB 3.375 g  Status:  Discontinued        3.375 g 12.5 mL/hr over 240 Minutes Intravenous Every 8 hours 11/12/2022 2306 11/12/2022 1459   11/02/2022 2315  clindamycin (CLEOCIN) IVPB 900 mg  Status:  Discontinued        900 mg 100 mL/hr over 30 Minutes Intravenous Every 8 hours 11/23/2022 2306 11/02/22 1222   10/25/2022 1930  vancomycin (VANCOCIN) IVPB 1000 mg/200 mL premix  Status:  Discontinued       See Hyperspace for full Linked Orders Report.   1,000 mg 200 mL/hr over 60 Minutes Intravenous  Once 11/13/2022 1823 11/02/22 0029   10/24/2022 1830  ceFEPIme (MAXIPIME) 2 g in sodium chloride 0.9 % 100 mL IVPB        2 g 200 mL/hr over 30 Minutes Intravenous  Once 11/23/2022 1819 11/13/2022 2016   11/08/2022 1830  metroNIDAZOLE (FLAGYL) IVPB 500 mg        500 mg 100 mL/hr over 60 Minutes Intravenous  Once 11/23/2022 1819 11/17/2022 2130   10/28/2022 1830  vancomycin (VANCOCIN) IVPB 1000 mg/200 mL premix  Status:  Discontinued        1,000 mg 200 mL/hr over 60 Minutes Intravenous  Once 11/17/2022 1819 11/04/2022 1823   11/16/2022 1830  vancomycin (VANCOCIN) IVPB 1000 mg/200 mL premix  Status:  Discontinued       See Hyperspace for full Linked Orders Report.   1,000 mg 200 mL/hr over 60 Minutes Intravenous  Once 11/02/2022 1823 11/02/22 0029        Assessment/Plan  S/p debridement of buttock NSTI  10/29/2022 Dr. Derrell Lolling  S/p excisional debridement buttock and perineal wound 11/21/2022 Dr. Dwain Sarna S/p excisional debridement perineal wound 11/27/22 Dr. Derrell Lolling S/P  excisional debridement perineal wound and buttock wound 11/12/2022 Dr. Magnus Ivan - dressing changed today while pt on ventilator - required 2 mg versed, 2 mg of dilaudid and 100 mcg bolus fentanyl  - daily dressing changes with Dakin's for 3-5 days for pseudomonal drainage - will need to figure out meds for dressing changes if planning to extubate - cont abx therapy, Cx - pansensitive GBS and strep mitis/oralis - ideally this  patient would get a diverting colostomy to keep stool out of her wound but I think her body habitus precludes this.   FEN - NPO on vent, tube feeds VTE - SQH ID - Clindamycin, Zosyn, Vanc; narrowed to unasyn 1/15>>   - below per CCM -  Septic shock - secondary to above, now off pressors.  Appreciate CCM's care of this patient as she is critically ill ARF - cr 0.7, improved DM - on insulin gtt  Morbid obesity - BMI 72 Hypothryoidism PCOS HLD Anxiety/Depression      LOS: 9 days   I reviewed nursing notes, hospitalist notes, last 24 h vitals and pain scores, last 48 h intake and output, and last 24 h labs and trends.   Norm Parcel, [facility] 11/10/2022, 10:36 AM Please see Amion for pager number during day hours 7:00am-4:30pm

## 2022-11-10 NOTE — Progress Notes (Signed)
NAME:  Lindsey [name], MRN:  [id number], DOB:  11/05/1985, LOS: 9 ADMISSION DATE:  11/23/2022, CONSULTATION DATE:  11/23/2022 REFERRING MD:  Dr. Alvino Chapel , CHIEF COMPLAINT:  HHS   History of Present Illness:  Lindsey [name] is a 37 y.o. female who has a PMH as below including DM2. She presented to [facility] 1/9 with fatigue, dyspnea, buttock wounds that had been getting worse. First noticed buttock wound around thanksgiving but it healed on its own. Then recurred around new years and has persisted and worsened to the point of malodorous drainage. She has also had fatigue and dyspnea. She apparently had been eating badly around the holidays and her CBG's had been reading high. Normally she states that CBG's are well controlled.   In ED, she was found to have HHS. 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   While at [facility], she also developed A.Fib RVR for which she received Amiodarone bolus x 2 followed by infusion. She was given one dose of Flagyl and was transferred to [facility] ED for surgical evaluation. En route to [facility], she converted to NSR. BP has been stable since ED presentation.   She was evaluated by surgery and is being taken to the OR for debridement tonight. She is currently on Insulin infusion, Amiodarone infusion, potassium supplementation. Her mother is with her at the bedside.  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   Significant Hospital Events: Including procedures, antibiotic start and stop dates in addition to other pertinent events   CT abdomen pelvis 1/9 > basilar atelectasis, normal stomach, appendix, bowel wall, no hernia, mild to moderate subcutaneous impaired muscular inflammatory fat stranding anterolateral aspect abdominal wall on the left, no ascites.  Soft tissue gas along the perineum on the right with medial and posterior medial right gluteal involvement consistent with Fournier's gangrene OR for debridement 1/9, 1/11, 1/13 Blood cultures 1/9 >  Urine culture 1/9 > yeast 70 K Right buttock wound culture 1/10 > GPC, GNR > abundant group B strep, abundant strep mitis PICC placed 1/12  Interim History / Subjective:  Precedex was added overnight due to agitation.  Patient is intubated and sedated  Objective   Blood pressure (!) 118/47, pulse 68, temperature 98.8 F (37.1 C), temperature source Axillary, resp. rate (!) 27, height 5\' 6"  (1.676 m), weight (!) 216 kg, SpO2 96 %. CVP:  [12 mmHg-23 mmHg] 23 mmHg  Vent Mode: PRVC FiO2 (%):  [40 %-50 %] 40 % Set Rate:  [28 bmp] 28 bmp Vt Set:  [480 mL] 480 mL PEEP:  [8 cmH20] 8 cmH20 Plateau Pressure:  [24 cmH20-29 cmH20] 24 cmH20   Intake/Output Summary (Last 24 hours) at 11/10/2022 0803 Last data filed at 11/10/2022 0634 Gross per 24 hour  Intake 3651.11 ml  Output 3190 ml  Net 461.11 ml    Filed Weights   11/08/22 0545 11/09/22 0337 11/10/22 0343  Weight: (!) 214 kg (!) 214 kg (!) 216 kg    Examination:  Physical Exam Constitutional:      General: She is in acute distress.     Appearance: She is ill-appearing.  HENT:     Mouth/Throat:     Comments: ET tube in place. Eyes:     General:        Right eye: No discharge.        Left eye: No discharge.  Cardiovascular:     Rate and Rhythm: Normal rate and regular rhythm.  Pulmonary:      Effort: Pulmonary effort is normal. No respiratory distress.  Abdominal:     General: Bowel sounds are normal.     Comments: Mildly distended  Musculoskeletal:        General: Normal range of motion.     Right lower leg: Edema present.     Left lower leg: Edema present.     Comments: Trace edema of bilat LE  Skin:    General: Skin is warm.      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   Wound culture with group B strep, nontoxin producing, strep mitis -Greatly appreciate surgery management.  S/p debridement on Dec 01, 2022, 1/11, 1/13 and 1/15.  Patient received high-dose of fentanyl 300 mg and 4 mg Versed for the dressing change yesterday.  Pain control will be an big issue if she is extubated.  Depends on the frequency of the required dressing change, we will decide on her extubation.  I think she might benefit from a diverting colostomy due to the location of her wound. -Continue Unasyn.  Duration for antibiotic per surgery team -Scheduled Percocet and fentanyl as needed for pain control  Severe uncontrolled hypertension Blood pressure well-controlled on hydralazine and amlodipine  Type 2 diabetes A1C 14.7 -Home glimepiride and pioglitazone are on hold -She was switched to subcu insulin yesterday.  Increase Semglee to 50 mg twice daily and NovoLog 14 mg q4h. Goal CBG < 180 for wound healing.  Acute blood loss anemia from 4 debridement surgeries Hemoglobin dropped to 6.6.  Currently getting 1 unit of blood. -Follow-up posttransfusion H&H  Acute metabolic encephalopathy  Initially due to HHS, acute infection with septic shock.  CT head was negative for any acute intracranial abnormality.  Her mentation is improving.  The driving force right now is her severe infection and Fournier gangrene. -Continue fentanyl and Precedex until after dressing change   Acute respiratory failure due to acute  encephalopathy from infection 2/2 for near gangrene, also possible contribution of evolving bilateral pulmonary infiltrates with total body volume overload, pulmonary edema Continue holding Lasix today due to her hypernatremia  Hypernatremia Calculated 10L free water deficit Increase free water to 200 cc Q4h   Hx Hypothyroidism. -Continue Home levothyroxine dose 75 mcg daily   Hx Anxiety, Depression, Panic disorder, PTSD. -Resume home Seroquel   Idiopathic intracranial hypertension -Has been managed with Diamox as an outpatient, continue to hold for now  Best Practice (right click and "Reselect all SmartList Selections" daily)   Diet/type: tube feed DVT prophylaxis: held due to blood loss anemia GI prophylaxis: PPI Lines: NA Foley:  Yes, and it is still needed because of Fournier gangrene Code Status:  full code Last date of multidisciplinary goals of care discussion [1/18. Updated mother at bedside]  Labs   CBC: Recent Labs  Lab 11/16/2022 0328 10/27/2022 0915 11/08/22 0317 11/09/22 0311 11/09/22 2026 11/10/22 0335  WBC 34.9*  --  28.6*  22.8* 23.7* 22.0*  HGB 8.1* 14.3 7.5* 9.7* 7.5* 6.6*  HCT 25.8* 42.0 23.4* 30.1* 24.0* 21.6*  MCV 90.2  --  89.0 90.7 94.9 96.0  PLT 204  --  205 192 202 200     Basic Metabolic Panel: Recent Labs  Lab 11/04/22 0505 11/04/22 0836 11/12/2022 0344 10/28/2022 1814 11/10/2022 1836 11/08/2022 0045 11/02/2022 0328 11/15/2022 0915 11/08/22 0317 11/09/22 0311 11/09/22 2026 11/10/22 0335  NA 132*   < > 134* 134*   < > 139 141 147* 146* 151* 153* 153*  K 3.2*   < > 4.3 4.5   < > 3.6 3.8 3.9 3.7 3.9 4.1 4.3  CL 97*   < > 99 101   < > 107 105  --  110 114* 118* 114*  CO2 24   < > 23 23   < > 23 24  --  28 30 30 31   GLUCOSE 148*   < > 207* 240*   < > 335* 333*  --  182* 158* 247* 247*  BUN 45*   < > 51* 61*   < > 71* 66*  --  49* 32* 26* 29*  CREATININE 1.13*   < > 1.41* 1.32*   < > 1.19* 1.19*  --  0.81 0.70 0.63 0.73  CALCIUM 6.5*   < > 7.0*  7.2*   < > 7.6* 7.6*  --  8.3* 8.1* 7.8* 8.0*  MG 2.6*  --  2.9* 3.0*  --  2.9* 3.0*  --   --   --   --   --   PHOS 2.4*  --   --   --   --  3.6 3.6  --   --   --   --   --    < > = values in this interval not displayed.    GFR: Estimated Creatinine Clearance: 187.2 mL/min (by C-G formula based on SCr of 0.73 mg/dL). Recent Labs  Lab 11/08/22 0317 11/09/22 0311 11/09/22 2026 11/10/22 0335  WBC 28.6* 22.8* 23.7* 22.0*     Liver Function Tests: Recent Labs  Lab 11/06/22 0050  AST 19  ALT 19  ALKPHOS 83  BILITOT 1.4*  PROT 5.7*  ALBUMIN <1.5*    No results for input(s): "LIPASE", "AMYLASE" in the last 168 hours. No results for input(s): "AMMONIA" in the last 168 hours.  ABG    Component Value Date/Time   PHART 7.343 (L) 11/08/2022 0915   PCO2ART 50.0 (H) 11/14/2022 0915   PO2ART 95 11/23/2022 0915   HCO3 27.4 11/13/2022 0915   TCO2 29 11/06/2022 0915   ACIDBASEDEF 1.0 11/06/2022 1836   O2SAT 97 11/18/2022 0915     Coagulation Profile: No results for input(s): "INR", "PROTIME" in the last 168 hours.  Cardiac Enzymes: No results for input(s): "CKTOTAL", "CKMB", "CKMBINDEX", "TROPONINI" in the last 168 hours.  HbA1C: Hgb A1c MFr Bld  Date/Time Value Ref Range Status  10/30/2022 10:42 PM 14.7 (H) 4.8 - 5.6 % Final    Comment:    (NOTE) Pre diabetes:          5.7%-6.4%  Diabetes:              >6.4%  Glycemic control for   <7.0% adults with diabetes     CBG: Recent Labs  Lab 11/09/22 1615 11/09/22 1916 11/09/22 2324 11/10/22 0316 11/10/22 0737  GLUCAP 170* 209* 255* 227* 173*     Review of Systems:  Past Medical History:  She,  has a past medical history of Anxiety and depression, Depression (02/22/2015), Diabetes (HCC), Headache(784.0), High cholesterol, Hypercholesteremia, Hyperlipidemia, Hypertension, Hypothyroidism, Obesity, Panic disorder, Polycystic ovary, Pseudotumor cerebri, and PTSD (post-traumatic stress disorder).   Surgical  History:   Past Surgical History:  Procedure Laterality Date   INCISION AND DRAINAGE PERIRECTAL ABSCESS N/A 11/12/2022   Procedure: EXCISIONAL DEBRIDEMENT PERINEAL WOUND;  Surgeon: Axel Filler, MD;  Location: [name] OR;  Service: General;  Laterality: N/A;   IRRIGATION AND DEBRIDEMENT ABSCESS N/A 11/15/2022   Procedure: EXCISIONAL DEBRIDEMENT PERINEAL WOUND;  Surgeon: Emelia Loron, MD;  Location: [name] OR;  Service: General;  Laterality: N/A;   IRRIGATION AND DEBRIDEMENT ABSCESS N/A 11/16/2022   Procedure: IRRIGATION AND EXCISIONAL DEBRIDEMENT PERINEAL WOUND;  Surgeon: Abigail Miyamoto, MD;  Location: [facility] OR;  Service: General;  Laterality: N/A;   IRRIGATION AND DEBRIDEMENT BUTTOCKS Right 10/24/2022   Procedure: IRRIGATION AND DEBRIDEMENT BUTTOCKS;  Surgeon: Axel Filler, MD;  [facility] OR;  Service: General;  Laterality: Right;   LUMBAR PUNCTURE     WOUND DEBRIDEMENT N/A 11/04/2022   Procedure: EXCISIONAL DEBRIDEMENT OF BUTTOCK;  Surgeon: Emelia Loron, MD;  Location: [facility] OR;  Service: General;  Laterality: N/A;   WOUND DEBRIDEMENT N/A 11/09/2022   Procedure: DEBRIDEMENT  BUTTOCK WOUND AND DRESSING CHANGE UNDER ANESTHESIA;  Surgeon: Abigail Miyamoto, MD;  Location: [facility] OR;  Service: General;  Laterality: N/A;     Social History:   reports that she has quit smoking. Her smoking use included cigarettes. She smoked an average of .25 packs per day. She has never used smokeless tobacco. She reports current drug use. Drug: Marijuana. She reports that she does not drink alcohol.   Family History:  Her family history includes Breast cancer in her maternal grandmother; Cancer in an other family member; Coronary artery disease in her mother and another family member; Diabetes in an other family member; Heart failure in her maternal grandfather; Kidney disease in her father; Narcolepsy in her maternal grandmother; Polycystic ovary syndrome in her mother.   Allergies Allergies  Allergen  Reactions   Bupropion Other (See Comments), Palpitations and Shortness Of Breath   Anti-Inflammatory Enzyme [Nutritional Supplements] Swelling    And fluid on the brain   Hydroxyzine Other (See Comments)   Metformin Nausea Only and Other (See Comments)   Nsaids Other (See Comments), Hypertension and Swelling   Rosuvastatin Nausea And Vomiting and Other (See Comments)     Home Medications  Prior to Admission medications   Medication Sig Start Date End Date Taking? Authorizing Provider  acetaZOLAMIDE ER (DIAMOX) 500 MG capsule Take 1 capsule (500 mg total) by mouth in the morning, at noon, and at bedtime. 06/21/21 11/02/23 Yes Camara, Amalia Hailey, MD  glimepiride (AMARYL) 2 MG tablet Take 2 mg by mouth daily. 05/29/21  Yes [provider]  hydrOXYzine (ATARAX) 25 MG tablet Take 25 mg by mouth 3 (three) times daily as needed for anxiety (first line).   Yes [provider]  levothyroxine (SYNTHROID) 75 MCG tablet Take 75 mcg by mouth daily. 06/02/21  Yes [provider]  pioglitazone (ACTOS) 15 MG tablet Take 45 mg by mouth daily. 02/18/21  Yes [provider]  pravastatin (PRAVACHOL) 20 MG tablet Take 20 mg by mouth once a week. Monday 05/29/21  Yes [provider]  QUEtiapine (SEROQUEL) 50 MG tablet Take 75 mg by mouth at bedtime. 01/17/22  Yes [provider]  escitalopram (LEXAPRO) 20 MG tablet Take 20 mg by  mouth daily. Patient not taking: Reported on 11/19/2022    [provider]  irbesartan (AVAPRO) 300 MG tablet Take 300 mg by mouth daily. Patient not taking: Reported on 11/22/2022 05/31/21   [provider]  LORazepam (ATIVAN) 1 MG tablet Take 1 mg by mouth daily as needed for anxiety (second option). 02/11/22   [provider]  propranolol (INDERAL) 10 MG tablet Take 1 tablet (10 mg total) by mouth daily. Patient taking differently: Take 20 mg by mouth 2 (two) times daily. 03/28/22 11/02/23  Oneta Rack, NP     Critical  care time: 25 min   Doran Stabler, DO Internal Medicine Residency My pager: [phone]

## 2022-11-10 NOTE — Progress Notes (Signed)
Left arm TL PICC is looped in the right brachiocephalic vein as per Chest Xray. 2  Lumens is occluded, and 1 lumen is hard to flushed. With order for PICC exchange.

## 2022-11-10 NOTE — Progress Notes (Signed)
eLink Physician-Brief Progress Note Patient Name: Lindsey [name] DOB: [birth year] MRN: [id number]   Date of Service  11/10/2022  HPI/Events of Note  Hemoglobin of 6.6 gm / dl, no evidence of active bleeding, hemodynamically stable.  eICU Interventions  One unit PRBC ordered transfused.        Kerry Kass Ceasar Decandia 11/10/2022, 4:25 AM

## 2022-11-10 NOTE — Progress Notes (Signed)
Peripherally Inserted Central Catheter Placement  The IV Nurse has discussed with the patient and/or persons authorized to consent for the patient, the purpose of this procedure and the potential benefits and risks involved with this procedure.  The benefits include less needle sticks, lab draws from the catheter, and the patient may be discharged home with the catheter. Risks include, but not limited to, infection, bleeding, blood clot (thrombus formation), and puncture of an artery; nerve damage and irregular heartbeat and possibility to perform a PICC exchange if needed/ordered by physician.  Alternatives to this procedure were also discussed.  Bard Power PICC patient education guide, fact sheet on infection prevention and patient information card has been provided to patient /or left at bedside.    PICC Placement Documentation  PICC Triple Lumen 16/10/96 Left Cephalic 53 cm 0 cm (Active)  Indication for Insertion or Continuance of Line Vasoactive infusions 11/10/22 1518  Exposed Catheter (cm) 0 cm 11/10/22 1518  Site Assessment Clean, Dry, Intact 11/10/22 1518  Lumen #1 Status Flushed;Blood return noted;Saline locked 11/10/22 1518  Lumen #2 Status Flushed;Blood return noted;Saline locked 11/10/22 1518  Lumen #3 Status Flushed;Blood return noted;Saline locked 11/10/22 1518  Dressing Type Transparent 11/10/22 1518  Dressing Status Antimicrobial disc in place 11/10/22 [place] checked and tightened 11/10/22 1518  Dressing Change Due 11/17/22 11/10/22 1518   PICC exchange;other PICC malpositioned    Scotty Court 11/10/2022, 3:22 PM

## 2022-11-10 NOTE — Progress Notes (Signed)
Nutrition Follow-up  DOCUMENTATION CODES:  Morbid obesity  INTERVENTION: Continue TF via cortrak  Vital 1.5 @ 24mL/h (1.68L/d) Prosource TF 20 2x/d Free water 263mL q4h (per MD) This will provide 2680kcal, 153g of protein, and 1226mL of free water. (2454mL with flushes) 1 packet Juven BID, each packet provides 95 calories, 2.5 grams of protein (collagen), and 9.8 grams of carbohydrate (3 grams sugar); also contains 7 grams of L-arginine and L-glutamine, 300 mg vitamin C, 15 mg vitamin E, 1.2 mcg vitamin B-12, 9.5 mg zinc, 200 mg calcium, and 1.5 g  Calcium Beta-hydroxy-Beta-methylbutyrate to support wound healing Vitamin C 250mg   daily, Zinc 220mg  x14 days for wound healing Continue banatrol BID to aid in adding bulk to stool.   NUTRITION DIAGNOSIS:  Increased nutrient needs related to wound healing as evidenced by estimated needs. - remains applicable  GOAL:  Provide needs based on ASPEN/SCCM guidelines - progressing, TF at goal  MONITOR:  Vent status, Labs, I & O's, Skin, TF tolerance  REASON FOR ASSESSMENT:  Ventilator    ASSESSMENT:  Pt with hx of DM type 2, HTN, HLD and hypothyroidism presented to ED with worsening pain from known boils on her buttocks. Imaging in ED showed large gas containing infection of the right gluteus.  1/10 - Op, I&D, returned to OR on vent 1/11 - Op, I&D    1/15 - Op I&D, Cortrak placed (tip in gastric antrum)   Pt resting in bed at the time of assessment. Remains ventilated. Discussed in rounds. Last OR visit for I&D on the 15th, no more visits scheduled at this time but pt is having daily dressing changes at bedside now. Per surgery - ok to wean from vent when pt has adequate pain regimen.   TF infusing at goal via cortrak. Adjusted rate and protein modulars slightly to maximize protein intake for would healing.   MD added increased free water flushes today for hypernatremia.   MV: 13.1 L/min Temp (24hrs), Avg:98.8 F (37.1 C), Min:98.7 F  (37.1 C), Max:99 F (37.2 C)   Intake/Output Summary (Last 24 hours) at 11/10/2022 0855 Last data filed at 11/10/2022 0634 Gross per 24 hour  Intake 3601.11 ml  Output 2890 ml  Net 711.11 ml  Net IO Since Admission: 16,599.6 mL [11/10/22 0855]  Nutritionally Relevant Medications: Scheduled Meds:  ascorbic acid  250 mg Per Tube Daily   PROSource TF20  120 mL Per Tube BID   BANATROL TF  60 mL Per Tube BID   free water  200 mL Per Tube Q4H   insulin aspart  0-20 Units Subcutaneous Q4H   insulin aspart  14 Units Subcutaneous Q4H   insulin glargine-yfgn  50 Units Subcutaneous Q12H   nutrition supplement (JUVEN)  1 packet Per Tube BID BM   pantoprazole (PROTONIX) IV  40 mg Intravenous Q24H   senna-docusate  1 tablet Per Tube BID   zinc sulfate  220 mg Per Tube Daily   Continuous Infusions:  dexmedetomidine (PRECEDEX) IV infusion 0.2 mcg/kg/hr (11/10/22 0634)   feeding supplement (VITAL 1.5 CAL) 75 mL/hr at 11/10/22 0634   PRN Meds: dextrose, docusate, polyethylene glycol  Labs Reviewed: Na 132, chloride 97 K 3.2 BUN 45, creatinine 1.13 Phosphorus 2.4 Mg 2.6 CBG ranges from 132-250 mg/dL over the last 24 hours  HgbA1c 14.7% November 23, 2022)  NUTRITION - FOCUSED PHYSICAL EXAM: Flowsheet Row Most Recent Value  Orbital Region No depletion  Upper Arm Region No depletion  Thoracic and Lumbar Region No depletion  Buccal  Region No depletion  Temple Region No depletion  Clavicle Bone Region No depletion  Clavicle and Acromion Bone Region No depletion  Scapular Bone Region No depletion  Dorsal Hand No depletion  Patellar Region No depletion  Anterior Thigh Region No depletion  Posterior Calf Region No depletion  Edema (RD Assessment) Moderate  [generalized]  Hair Reviewed  Eyes Reviewed  Mouth Reviewed  Skin Reviewed  Nails Reviewed   Diet Order:   Diet Order             Diet NPO time specified  Diet effective now                   EDUCATION NEEDS:  Not appropriate for  education at this time  Skin:  Skin Assessment: Reviewed RN Assessment (fournier's gangrene of perineum and buttocks)  Last BM:  Fecal management system, 1.2L output in the last 24 hours  Height:  Ht Readings from Last 1 Encounters:  10/24/2022 5\' 6"  (1.676 m)    Weight:  Wt Readings from Last 1 Encounters:  11/10/22 (!) 216 kg    Ideal Body Weight:  59.1 kg  BMI:  Body mass index is 76.86 kg/m.  Estimated Nutritional Needs:  Kcal:  2700-3000 kcal/d Protein:  135-150 g/d Fluid:  2.2-2.5L/d    Lindsey [name], RD, LDN Clinical Dietitian RD pager # available in [facility]  After hours/weekend pager # available in [facility]

## 2022-11-11 DIAGNOSIS — J9601 Acute respiratory failure with hypoxia: Secondary | ICD-10-CM

## 2022-11-11 DIAGNOSIS — Z9911 Dependence on respirator [ventilator] status: Secondary | ICD-10-CM

## 2022-11-11 LAB — TYPE AND SCREEN
ABO/RH(D): A POS
Antibody Screen: NEGATIVE
Unit division: 0

## 2022-11-11 LAB — GLUCOSE, CAPILLARY
Glucose-Capillary: 127 mg/dL — ABNORMAL HIGH (ref 70–99)
Glucose-Capillary: 155 mg/dL — ABNORMAL HIGH (ref 70–99)
Glucose-Capillary: 155 mg/dL — ABNORMAL HIGH (ref 70–99)
Glucose-Capillary: 161 mg/dL — ABNORMAL HIGH (ref 70–99)
Glucose-Capillary: 165 mg/dL — ABNORMAL HIGH (ref 70–99)
Glucose-Capillary: 94 mg/dL (ref 70–99)
Glucose-Capillary: 96 mg/dL (ref 70–99)

## 2022-11-11 LAB — BASIC METABOLIC PANEL
Anion gap: 7 (ref 5–15)
BUN: 32 mg/dL — ABNORMAL HIGH (ref 6–20)
CO2: 28 mmol/L (ref 22–32)
Calcium: 7.3 mg/dL — ABNORMAL LOW (ref 8.9–10.3)
Chloride: 119 mmol/L — ABNORMAL HIGH (ref 98–111)
Creatinine, Ser: 0.85 mg/dL (ref 0.44–1.00)
GFR, Estimated: >60 mL/min (ref >60)
Glucose, Bld: 139 mg/dL — ABNORMAL HIGH (ref 70–99)
Potassium: 4.2 mmol/L (ref 3.5–5.1)
Sodium: 154 mmol/L — ABNORMAL HIGH (ref 135–145)

## 2022-11-11 LAB — CBC
HCT: 24.1 % — ABNORMAL LOW (ref 36.0–46.0)
Hemoglobin: 7.4 g/dL — ABNORMAL LOW (ref 12.0–15.0)
MCH: 29.6 pg (ref 26.0–34.0)
MCHC: 30.7 g/dL (ref 30.0–36.0)
MCV: 96.4 fL (ref 80.0–100.0)
Platelets: 166 10*3/uL (ref 150–400)
RBC: 2.5 MIL/uL — ABNORMAL LOW (ref 3.87–5.11)
RDW: 17.7 % — ABNORMAL HIGH (ref 11.5–15.5)
WBC: 19.4 10*3/uL — ABNORMAL HIGH (ref 4.0–10.5)
nRBC: 1.7 % — ABNORMAL HIGH (ref 0.0–0.2)

## 2022-11-11 LAB — BPAM RBC
Blood Product Expiration Date: 202402112359
ISSUE DATE / TIME: 202401180604
Unit Type and Rh: 6200

## 2022-11-11 MED ORDER — INSULIN GLARGINE-YFGN 100 UNIT/ML ~~LOC~~ SOLN
47.0000 [IU] | Freq: Two times a day (BID) | SUBCUTANEOUS | Status: DC
  Administered 2022-11-11 – 2022-11-13 (×4): 47 [IU] via SUBCUTANEOUS
  Filled 2022-11-11 (×5): qty 0.47

## 2022-11-11 MED ORDER — ORAL CARE MOUTH RINSE
15.0000 mL | OROMUCOSAL | Status: DC
  Administered 2022-11-12 – 2022-11-25 (×53): 15 mL via OROMUCOSAL

## 2022-11-11 MED ORDER — INSULIN ASPART 100 UNIT/ML IJ SOLN
12.0000 [IU] | INTRAMUSCULAR | Status: DC
  Administered 2022-11-11 – 2022-11-12 (×4): 12 [IU] via SUBCUTANEOUS

## 2022-11-11 MED ORDER — HYDROMORPHONE HCL 1 MG/ML IJ SOLN
2.0000 mg | Freq: Every day | INTRAMUSCULAR | Status: DC | PRN
  Administered 2022-11-12: 2 mg via INTRAVENOUS
  Administered 2022-11-13: 3 mg via INTRAVENOUS
  Administered 2022-11-14 – 2022-11-15 (×2): 2 mg via INTRAVENOUS
  Administered 2022-11-16: 1 mg via INTRAVENOUS
  Filled 2022-11-11 (×2): qty 3
  Filled 2022-11-11 (×4): qty 2

## 2022-11-11 MED ORDER — HEPARIN SODIUM (PORCINE) 5000 UNIT/ML IJ SOLN
7500.0000 [IU] | Freq: Three times a day (TID) | INTRAMUSCULAR | Status: DC
  Administered 2022-11-11 – 2022-11-25 (×42): 7500 [IU] via SUBCUTANEOUS
  Filled 2022-11-11 (×42): qty 2

## 2022-11-11 MED ORDER — OXYCODONE HCL 5 MG PO TABS
15.0000 mg | ORAL_TABLET | ORAL | Status: DC
  Administered 2022-11-11 – 2022-11-12 (×6): 15 mg
  Filled 2022-11-11 (×6): qty 3

## 2022-11-11 MED ORDER — FREE WATER
300.0000 mL | Status: DC
  Administered 2022-11-11: 300 mL

## 2022-11-11 MED ORDER — FREE WATER
300.0000 mL | Status: DC
  Administered 2022-11-11 – 2022-11-12 (×6): 300 mL

## 2022-11-11 MED ORDER — MIDAZOLAM HCL 2 MG/2ML IJ SOLN: 2.0000 mg | Freq: Once | INTRAMUSCULAR | Status: DC

## 2022-11-11 MED ORDER — HYDROMORPHONE HCL 1 MG/ML IJ SOLN
2.0000 mg | INTRAMUSCULAR | Status: DC | PRN
  Administered 2022-11-11: 2 mg via INTRAVENOUS
  Filled 2022-11-11: qty 2

## 2022-11-11 MED ORDER — GABAPENTIN 600 MG PO TABS
600.0000 mg | ORAL_TABLET | Freq: Three times a day (TID) | ORAL | Status: DC
  Administered 2022-11-11 – 2022-11-13 (×7): 600 mg
  Filled 2022-11-11 (×9): qty 1

## 2022-11-11 MED ORDER — OXYCODONE HCL 5 MG PO TABS
5.0000 mg | ORAL_TABLET | Freq: Every day | ORAL | Status: DC
  Administered 2022-11-12: 5 mg
  Filled 2022-11-11: qty 1

## 2022-11-11 MED ORDER — ORAL CARE MOUTH RINSE: 15.0000 mL | OROMUCOSAL | Status: DC | PRN

## 2022-11-11 NOTE — Progress Notes (Signed)
NAME:  Lindsey [name], MRN:  [id number], DOB:  11-03-1985, LOS: 25 ADMISSION DATE:  10/24/2022, CONSULTATION DATE:  11/12/2022 REFERRING MD:  Dr. Alvino Chapel , CHIEF COMPLAINT:  HHS   History of Present Illness:  Lindsey [name] is a 37 y.o. female who has a PMH as below including DM2. She presented to [facility] 1/9 with fatigue, dyspnea, buttock wounds that had been getting worse. First noticed buttock wound around thanksgiving but it healed on its own. Then recurred around new years and has persisted and worsened to the point of malodorous drainage. She has also had fatigue and dyspnea. She apparently had been eating badly around the holidays and her CBG's had been reading high. Normally she states that CBG's are well controlled.   In ED, she was found to have HHS. 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   While at [facility], she also developed A.Fib RVR for which she received Amiodarone bolus x 2 followed by infusion. She was given one dose of Flagyl and was transferred to [facility] ED for surgical evaluation. En route to [facility], she converted to NSR. BP has been stable since ED presentation.   She was evaluated by surgery and is being taken to the OR for debridement tonight. She is currently on Insulin infusion, Amiodarone infusion, potassium supplementation. Her mother is with her at the bedside.  Pertinent  Medical History  has Pseudotumor cerebri; Obesity, morbid ([place]); Vision disturbance; Idiopathic intracranial hypertension; Type 2 diabetes mellitus with other specified complication ([place]); Anxiety; Depression; High cholesterol; Hypertension; Hypothyroidism; Major depression in partial remission ([place]); Panic disorder; Morbid obesity ([place]); Major depressive disorder, recurrent episode, severe ([place]); Panic disorder with agoraphobia; Hyperosmolar hyperglycemic state (HHS) ([place]); Fournier's gangrene in female  [facility]); Atrial fibrillation with RVR ([place]); and Septic shock ([place]) on their problem list.   Significant Hospital Events: Including procedures, antibiotic start and stop dates in addition to other pertinent events   CT abdomen pelvis 1/9 > basilar atelectasis, normal stomach, appendix, bowel wall, no hernia, mild to moderate subcutaneous impaired muscular inflammatory fat stranding anterolateral aspect abdominal wall on the left, no ascites.  Soft tissue gas along the perineum on the right with medial and posterior medial right gluteal involvement consistent with Fournier's gangrene OR for debridement 1/9, 1/11, Nov 16, 2022 Blood cultures 1/9 >  Urine culture 1/9 > yeast 70 K Right buttock wound culture 1/10 > GPC, GNR > abundant group B strep, abundant strep mitis PICC placed 1/12  Interim History / Subjective:  Patient was seen after dressing change this morning.  She is awake and alert.  She nodded her head when asked if she has any pain.  Preserved and follows commands. Objective   Blood pressure (!) 147/133, pulse 65, temperature 98.9 F (37.2 C), temperature source Oral, resp. rate (!) 29, height 5\' 6"  (1.676 m), weight (!) 216 kg, SpO2 97 %. CVP:  [12 mmHg-23 mmHg] 20 mmHg  Vent Mode: PRVC FiO2 (%):  [40 %] 40 % Set Rate:  [28 bmp] 28 bmp Vt Set:  [480 mL] 480 mL PEEP:  [8 cmH20] 8 cmH20 Plateau Pressure:  [22 cmH20-25 cmH20] 22 cmH20   Intake/Output Summary (Last 24 hours) at 11/11/2022 0802 Last data filed at 11/11/2022 0759 Gross per 24 hour  Intake 4427.68 ml  Output 1850 ml  Net 2577.68 ml    Filed Weights   11/08/22 0545 11/09/22 0337 11/10/22 0343  Weight: (!) 214 kg (!) 214 kg (!) 216 kg    Examination:  Physical Exam Constitutional:      General: She is in acute distress.     Appearance: She is ill-appearing.  HENT:     Mouth/Throat:     Comments: ET tube in place. Eyes:     General:        Right eye: No discharge.        Left eye: No discharge.  Cardiovascular:      Rate and Rhythm: Normal rate and regular rhythm.  Pulmonary:     Effort: Pulmonary effort is normal. No respiratory distress.  Abdominal:     General: Bowel sounds are normal.     Palpations: Abdomen is soft.     Comments: Mildly distended  Musculoskeletal:        General: Normal range of motion.     Right lower leg: Edema present.     Left lower leg: Edema present.     Comments: Trace edema of bilat LE  Skin:    General: Skin is warm.  Neurological:     Comments: Patient can follow commands.  Can move all extremities.      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   Wound culture with group B strep, nontoxin producing, strep mitis -Greatly appreciate surgery management.  S/p debridement on 1/9, 1/11, 1/13 and 1/15.  Per surgery, she will need daily dressing change for the next 3-5 days.  Pain control will be a big issue given she required high dose of IV fentanyl.  We will increase her oxycodone to 15 mg q6h and try to wean down her IV fentanyl.  She might benefit from fentanyl patches anticipating that she will need therapy. -Continue Unasyn per surgery  Acute respiratory failure due to acute encephalopathy from infection 2/2 for near gangrene, also possible contribution of evolving bilateral pulmonary infiltrates with total body volume overload, pulmonary edema -Continue holding Lasix today due to her hypernatremia -Try to wean today if able  Type 2 diabetes A1C 14.7 -Home glimepiride and pioglitazone are on hold -CBG trending down with lowest 94 today.   -Decrease Semglee to 47 u twice daily and Nov 12 u q4h  Hypertension Continue hydralazine and amlodipine  Blood loss anemia from 4 debridement surgeries -Hemoglobin 7.4 today -Check iron study  Hypernatremia Calculated 10L free water deficit Increase free water to 200 cc Q4h   Hx Hypothyroidism. -Continue Home  levothyroxine dose 75 mcg daily   Hx Anxiety, Depression, Panic disorder, PTSD. -Resume home Seroquel   Idiopathic intracranial hypertension -Has been managed with Diamox as an outpatient, continue to hold for now  Best Practice (right click and "Reselect all SmartList Selections" daily)   Diet/type: tube feed DVT prophylaxis: held due to blood loss anemia GI prophylaxis: PPI Lines: NA Foley:  Yes, and it is still needed because of Fournier gangrene Code Status:  full code Last date of multidisciplinary goals of care discussion [1/19. Updated mother at bedside]  Labs   CBC: Recent Labs  Lab 11/09/22 0311 11/09/22 2026 11/10/22 0335 11/10/22 1411 11/11/22 0355  WBC 22.8* 23.7* 22.0* 22.4* 19.4*  HGB 9.7* 7.5* 6.6* 8.1* 7.4*  HCT 30.1* 24.0* 21.6* 26.4* 24.1*  MCV 90.7 94.9 96.0 94.3 96.4  PLT 192 202 200 175 166     Basic Metabolic Panel: Recent Labs  Lab 11/13/2022 0344 10/30/2022 1814  11/04/2022 1836 11-17-2022 0045 17-Nov-2022 0328 11-17-2022 0915 11/08/22 0317 11/09/22 0311 11/09/22 2026 11/10/22 0335 11/11/22 0355  NA 134* 134*   < > 139 141   < > 146* 151* 153* 153* 154*  K 4.3 4.5   < > 3.6 3.8   < > 3.7 3.9 4.1 4.3 4.2  CL 99 101   < > 107 105  --  110 114* 118* 114* 119*  CO2 23 23   < > 23 24  --  28 30 30 31 28   GLUCOSE 207* 240*   < > 335* 333*  --  182* 158* 247* 247* 139*  BUN 51* 61*   < > 71* 66*  --  49* 32* 26* 29* 32*  CREATININE 1.41* 1.32*   < > 1.19* 1.19*  --  0.81 0.70 0.63 0.73 0.85  CALCIUM 7.0* 7.2*   < > 7.6* 7.6*  --  8.3* 8.1* 7.8* 8.0* 7.3*  MG 2.9* 3.0*  --  2.9* 3.0*  --   --   --   --   --   --   PHOS  --   --   --  3.6 3.6  --   --   --   --   --   --    < > = values in this interval not displayed.    GFR: Estimated Creatinine Clearance: 176.2 mL/min (by C-G formula based on SCr of 0.85 mg/dL). Recent Labs  Lab 11/09/22 2026 11/10/22 0335 11/10/22 1411 11/11/22 0355  WBC 23.7* 22.0* 22.4* 19.4*     Liver Function  Tests: Recent Labs  Lab 11/06/22 0050  AST 19  ALT 19  ALKPHOS 83  BILITOT 1.4*  PROT 5.7*  ALBUMIN <1.5*    No results for input(s): "LIPASE", "AMYLASE" in the last 168 hours. No results for input(s): "AMMONIA" in the last 168 hours.  ABG    Component Value Date/Time   PHART 7.343 (L) November 17, 2022 0915   PCO2ART 50.0 (H) 17-Nov-2022 0915   PO2ART 95 11-17-2022 0915   HCO3 27.4 2022/11/17 0915   TCO2 29 2022/11/17 0915   ACIDBASEDEF 1.0 11/04/2022 1836   O2SAT 97 11/17/2022 0915     Coagulation Profile: No results for input(s): "INR", "PROTIME" in the last 168 hours.  Cardiac Enzymes: No results for input(s): "CKTOTAL", "CKMB", "CKMBINDEX", "TROPONINI" in the last 168 hours.  HbA1C: Hgb A1c MFr Bld  Date/Time Value Ref Range Status  11/08/2022 10:42 PM 14.7 (H) 4.8 - 5.6 % Final    Comment:    (NOTE) Pre diabetes:          5.7%-6.4%  Diabetes:              >6.4%  Glycemic control for   <7.0% adults with diabetes     CBG: Recent Labs  Lab 11/10/22 1539 11/10/22 1935 11/10/22 2320 11/11/22 0328 11/11/22 0725  GLUCAP 209* 220* 146* 127* 94     Review of Systems:     Past Medical History:  She,  has a past medical history of Anxiety and depression, Depression (02/22/2015), Diabetes ([place]), Headache(784.0), High cholesterol, Hypercholesteremia, Hyperlipidemia, Hypertension, Hypothyroidism, Obesity, Panic disorder, Polycystic ovary, Pseudotumor cerebri, and PTSD (post-traumatic stress disorder).   Surgical History:   Past Surgical History:  Procedure Laterality Date   INCISION AND DRAINAGE PERIRECTAL ABSCESS N/A 10/27/2022   Procedure: EXCISIONAL DEBRIDEMENT PERINEAL WOUND;  Surgeon: Ralene Ok, MD;  Location: [facility];  Service: General;  Laterality: N/A;  IRRIGATION AND DEBRIDEMENT ABSCESS N/A 11-Nov-2022   Procedure: EXCISIONAL DEBRIDEMENT PERINEAL WOUND;  Surgeon: Emelia Loron, MD;  Location: [facility] OR;  Service: General;  Laterality: N/A;    IRRIGATION AND DEBRIDEMENT ABSCESS N/A 11/20/2022   Procedure: IRRIGATION AND EXCISIONAL DEBRIDEMENT PERINEAL WOUND;  Surgeon: Abigail Miyamoto, MD;  Location: [facility] OR;  Service: General;  Laterality: N/A;   IRRIGATION AND DEBRIDEMENT BUTTOCKS Right 10/25/2022   Procedure: IRRIGATION AND DEBRIDEMENT BUTTOCKS;  Surgeon: Axel Filler, MD;  Location: [facility] OR;  Service: General;  Laterality: Right;   LUMBAR PUNCTURE     WOUND DEBRIDEMENT N/A 11/11/22   Procedure: EXCISIONAL DEBRIDEMENT OF BUTTOCK;  Surgeon: Emelia Loron, MD;  Location: [facility] OR;  Service: General;  Laterality: N/A;   WOUND DEBRIDEMENT N/A 11/21/2022   Procedure: DEBRIDEMENT  BUTTOCK WOUND AND DRESSING CHANGE UNDER ANESTHESIA;  Surgeon: Abigail Miyamoto, MD;  Location: [facility] OR;  Service: General;  Laterality: N/A;     Social History:   reports that she has quit smoking. Her smoking use included cigarettes. She smoked an average of .25 packs per day. She has never used smokeless tobacco. She reports current drug use. Drug: Marijuana. She reports that she does not drink alcohol.   Family History:  Her family history includes Breast cancer in her maternal grandmother; Cancer in an other family member; Coronary artery disease in her mother and another family member; Diabetes in an other family member; Heart failure in her maternal grandfather; Kidney disease in her father; Narcolepsy in her maternal grandmother; Polycystic ovary syndrome in her mother.   Allergies Allergies  Allergen Reactions   Bupropion Other (See Comments), Palpitations and Shortness Of Breath   Anti-Inflammatory Enzyme [Nutritional Supplements] Swelling    And fluid on the brain   Hydroxyzine Other (See Comments)   Metformin Nausea Only and Other (See Comments)   Nsaids Other (See Comments), Hypertension and Swelling   Rosuvastatin Nausea And Vomiting and Other (See Comments)     Home Medications  Prior to Admission medications   Medication Sig Start Date  End Date Taking? Authorizing Provider  acetaZOLAMIDE ER (DIAMOX) 500 MG capsule Take 1 capsule (500 mg total) by mouth in the morning, at noon, and at bedtime. 06/21/21 11/02/23 Yes Camara, Amalia Hailey, MD  glimepiride (AMARYL) 2 MG tablet Take 2 mg by mouth daily. 05/29/21  Yes [provider]  hydrOXYzine (ATARAX) 25 MG tablet Take 25 mg by mouth 3 (three) times daily as needed for anxiety (first line).   Yes [provider]  levothyroxine (SYNTHROID) 75 MCG tablet Take 75 mcg by mouth daily. 06/02/21  Yes [provider]  pioglitazone (ACTOS) 15 MG tablet Take 45 mg by mouth daily. 02/18/21  Yes [provider]  pravastatin (PRAVACHOL) 20 MG tablet Take 20 mg by mouth once a week. Monday 05/29/21  Yes [provider]  QUEtiapine (SEROQUEL) 50 MG tablet Take 75 mg by mouth at bedtime. 01/17/22  Yes [provider]  escitalopram (LEXAPRO) 20 MG tablet Take 20 mg by mouth daily. Patient not taking: Reported on 11/11/22    [provider]  irbesartan (AVAPRO) 300 MG tablet Take 300 mg by mouth daily. Patient not taking: Reported on 2022-11-11 05/31/21   [provider]  LORazepam (ATIVAN) 1 MG tablet Take 1 mg by mouth daily as needed for anxiety (second option). 02/11/22   [provider]  propranolol (INDERAL) 10 MG tablet Take 1 tablet (10 mg total) by mouth daily. Patient taking differently: Take 20 mg by  mouth 2 (two) times daily. 03/28/22 11/02/23  Derrill Center, NP     Critical care time: 25 min   Gaylan Gerold, DO Internal Medicine Residency My pager: [phone]

## 2022-11-11 NOTE — Progress Notes (Signed)
[facility] Progress Note  4 Days Post-Op  Subjective: On the vent. RN staff at bedside for dressing change  Objective: Vital signs in last 24 hours: Temp:  [97.7 F (36.5 C)-98.9 F (37.2 C)] 98.9 F (37.2 C) (01/19 0726) Pulse Rate:  [54-79] 78 (01/19 0803) Resp:  [0-33] 30 (01/19 0803) BP: (112-147)/(42-133) 112/97 (01/19 0803) SpO2:  [92 %-99 %] 98 % (01/19 0803) FiO2 (%):  [40 %] 40 % (01/19 0810) Last BM Date : 11/10/22  Intake/Output from previous day: 01/18 0701 - 01/19 0700 In: 4064.4 [I.V.:746.2; Blood:767.5; NG/GT:2250.5; IV Piggyback:300.2] Out: 2094 [Urine:1250; Stool:400] Intake/Output this shift: Total I/O In: 475.5 [I.V.:39; NG/GT:436.5] Out: 200 [Urine:200]  PE: Gen:  intubated, responding to pain Card:  Regular rate and rhythm Pulm: ventilated respirations Abd: Soft, obese, non-tender GU: indwelling foley (UOP - 1.2 L)  and rectal tube in place with some stool leakage around; dressings removed and there was less blue-green drainage today Tissue is pale but there is no significant necrosis, some fibrinous exudate. Periwound is soft without crepitus Perineal and right labial wound communicating with buttock wound    Lab Results:  Recent Labs    11/10/22 1411 11/11/22 0355  WBC 22.4* 19.4*  HGB 8.1* 7.4*  HCT 26.4* 24.1*  PLT 175 166    BMET Recent Labs    11/10/22 0335 11/11/22 0355  NA 153* 154*  K 4.3 4.2  CL 114* 119*  CO2 31 28  GLUCOSE 247* 139*  BUN 29* 32*  CREATININE 0.73 0.85  CALCIUM 8.0* 7.3*    PT/INR No results for input(s): "LABPROT", "INR" in the last 72 hours. CMP     Component Value Date/Time   NA 154 (H) 11/11/2022 0355   K 4.2 11/11/2022 0355   CL 119 (H) 11/11/2022 0355   CO2 28 11/11/2022 0355   GLUCOSE 139 (H) 11/11/2022 0355   BUN 32 (H) 11/11/2022 0355   CREATININE 0.85 11/11/2022 0355   CALCIUM 7.3 (L) 11/11/2022 0355   PROT 5.7 (L) 11/06/2022 0050   ALBUMIN <1.5 (L) 11/06/2022 0050    AST 19 11/06/2022 0050   ALT 19 11/06/2022 0050   ALKPHOS 83 11/06/2022 0050   BILITOT 1.4 (H) 11/06/2022 0050   GFRNONAA >60 11/11/2022 0355   GFRAA >60 07/03/2016 0004   Lipase  No results found for: "LIPASE"     Studies/Results: [name] EKG SITE RITE  Result Date: 11/10/2022 If Site Rite image not attached, placement could not be confirmed due to current cardiac rhythm.  DG Humerus Left  Result Date: 11/10/2022 CLINICAL DATA:  Line placement EXAM: LEFT HUMERUS - 2 VIEW COMPARISON:  None Available. FINDINGS: No underlying fracture or dislocation. Preserved bone mineralization of the humerus. There is a PIC catheter in place with tip extending superior off the edge of the images towards the upper chest. Tip not included in the imaging field. IMPRESSION: No acute osseous abnormality.  Catheter in place Electronically Signed   By: Jill Side M.D.   On: 11/10/2022 13:16   DG CHEST PORT 1 VIEW  Result Date: 11/10/2022 CLINICAL DATA:  PICC line insertion EXAM: PORTABLE CHEST 1 VIEW COMPARISON:  Chest x-ray dated November 09, 2022 FINDINGS: Left arm PICC is looped in the right brachiocephalic vein. Unchanged position of ETT and feeding tube. Cardiac and mediastinal contours are unchanged. Mild diffuse interstitial opacities, likely due to pulmonary edema. No evidence of pleural effusion or pneumothorax. IMPRESSION: 1. Left arm PICC is looped in the right  brachiocephalic vein. Recommend repositioning. 2. Mild pulmonary edema. These results will be called to the ordering clinician or representative by the Radiologist Assistant, and communication documented in the PACS or [employer]. Electronically Signed   By: Allegra Lai M.D.   On: 11/10/2022 13:04   DG Chest Port 1 View  Result Date: 11/09/2022 CLINICAL DATA:  Acute respiratory failure, tachypnea. EXAM: PORTABLE CHEST 1 VIEW COMPARISON:  11/06/2022. FINDINGS: The heart is enlarged the mediastinal contour stable. Lung volumes are low  with no consolidation, effusion, or pneumothorax. An enteric tube courses over the left upper quadrant and out of the field of view. The endotracheal tube terminates 3.5 cm above the carina. Right internal jugular central venous catheter is no longer seen. IMPRESSION: 1. Cardiomegaly. 2. Support apparatus as described above. Electronically Signed   By: Thornell Sartorius M.D.   On: 11/09/2022 22:59    Anti-infectives: Anti-infectives (From admission, onward)    Start     Dose/Rate Route Frequency Ordered Stop   11/10/22 1600  Ampicillin-Sulbactam (UNASYN) 3 g in sodium chloride 0.9 % 100 mL IVPB        3 g 200 mL/hr over 30 Minutes Intravenous Every 6 hours 11/10/22 1438     11/10/22 1400  Ampicillin-Sulbactam (UNASYN) 3 g in sodium chloride 0.9 % 100 mL IVPB  Status:  Discontinued        3 g 200 mL/hr over 30 Minutes Intravenous Every 6 hours 11/10/22 1113 11/10/22 1438   11/20/2022 1630  Ampicillin-Sulbactam (UNASYN) 3 g in sodium chloride 0.9 % 100 mL IVPB  Status:  Discontinued        3 g 200 mL/hr over 30 Minutes Intravenous Every 6 hours 11/10/2022 1051 11/10/22 1113   11/13/2022 1600  piperacillin-tazobactam (ZOSYN) IVPB 3.375 g  Status:  Discontinued        3.375 g 12.5 mL/hr over 240 Minutes Intravenous Every 8 hours 11/09/2022 1459 11/11/2022 1031   11/02/22 1400  linezolid (ZYVOX) IVPB 600 mg  Status:  Discontinued        600 mg 300 mL/hr over 60 Minutes Intravenous Every 12 hours 11/02/22 1222 11/04/22 0830   11/02/22 1000  vancomycin (VANCOREADY) IVPB 1250 mg/250 mL  Status:  Discontinued        1,250 mg 166.7 mL/hr over 90 Minutes Intravenous Every 12 hours 11/22/2022 2135 10/27/2022 2137   11/02/22 1000  vancomycin (VANCOREADY) IVPB 1500 mg/300 mL  Status:  Discontinued        1,500 mg 150 mL/hr over 120 Minutes Intravenous Every 12 hours 11/21/2022 2137 11/02/22 1222   11/02/22 0300  ceFEPIme (MAXIPIME) 2 g in sodium chloride 0.9 % 100 mL IVPB  Status:  Discontinued        2 g 200 mL/hr over  30 Minutes Intravenous Every 8 hours 11/06/2022 2135 11/12/2022 2258   11/02/22 0045  vancomycin (VANCOREADY) IVPB 2000 mg/400 mL        2,000 mg 200 mL/hr over 120 Minutes Intravenous  Once 11/02/22 0030 11/02/22 0305   10/29/2022 2315  piperacillin-tazobactam (ZOSYN) IVPB 3.375 g  Status:  Discontinued        3.375 g 12.5 mL/hr over 240 Minutes Intravenous Every 8 hours 11/20/2022 2306 11/04/2022 1459   11/10/2022 2315  clindamycin (CLEOCIN) IVPB 900 mg  Status:  Discontinued        900 mg 100 mL/hr over 30 Minutes Intravenous Every 8 hours 10/31/2022 2306 11/02/22 1222   11/17/2022 1930  vancomycin (VANCOCIN) IVPB 1000 mg/200  mL premix  Status:  Discontinued       See Hyperspace for full Linked Orders Report.   1,000 mg 200 mL/hr over 60 Minutes Intravenous  Once 13-Nov-2022 1823 11/02/22 0029   2022/11/13 1830  ceFEPIme (MAXIPIME) 2 g in sodium chloride 0.9 % 100 mL IVPB        2 g 200 mL/hr over 30 Minutes Intravenous  Once 11/13/2022 1819 11/13/2022 2016   November 13, 2022 1830  metroNIDAZOLE (FLAGYL) IVPB 500 mg        500 mg 100 mL/hr over 60 Minutes Intravenous  Once 13-Nov-2022 1819 2022-11-13 2130   11/13/22 1830  vancomycin (VANCOCIN) IVPB 1000 mg/200 mL premix  Status:  Discontinued        1,000 mg 200 mL/hr over 60 Minutes Intravenous  Once 2022-11-13 1819 11-13-22 1823   13-Nov-2022 1830  vancomycin (VANCOCIN) IVPB 1000 mg/200 mL premix  Status:  Discontinued       See Hyperspace for full Linked Orders Report.   1,000 mg 200 mL/hr over 60 Minutes Intravenous  Once 2022/11/13 1823 11/02/22 0029        Assessment/Plan  S/p debridement of buttock NSTI  Nov 13, 2022 Dr. Rosendo Gros  S/p excisional debridement buttock and perineal wound 11/17/2022 Dr. Donne Hazel S/p excisional debridement perineal wound 10/24/2022 Dr. Rosendo Gros S/P excisional debridement perineal wound and buttock wound 11/06/2022 Dr. Ninfa Linden - dressing changed today while pt on ventilator - required 2 mg dilaudid, 50 mcg fentanyl, and got 10 mg of oxycodone ~ 1 hr  prior  - daily dressing changes with Dakin's for 3-5 days for pseudomonal drainage - will plan to change dressing tomorrow with nursing - will discuss pain regimen with pharmacy  - cont abx therapy through the weekend, Cx - pansensitive GBS and strep mitis/oralis - ideally this patient would get a diverting colostomy to keep stool out of her wound but I think her body habitus precludes this.   FEN - NPO on vent, tube feeds VTE - SQ[facility] ID - Clindamycin, Zosyn, Vanc; narrowed to unasyn 1/15>>   - below per CCM -  Septic shock - resolved ARF - cr 0.7, improved DM - on insulin gtt  Morbid obesity - BMI 72 Hypothryoidism PCOS HLD Anxiety/Depression      LOS: 10 days   I reviewed nursing notes, hospitalist notes, last 24 h vitals and pain scores, last 48 h intake and output, and last 24 h labs and trends.   Norm Parcel, [facility] 11/11/2022, 9:54 AM Please see Amion for pager number during day hours 7:00am-4:30pm

## 2022-11-11 NOTE — Procedures (Signed)
Extubation Procedure Note  Patient Details:   Name: Lindsey [name] DOB: Nov 13, 1985 MRN: [id number]   Airway Documentation:    Vent end date: 11/11/22 Vent end time: 1506   Evaluation  O2 sats: stable throughout Complications: No apparent complications Patient did tolerate procedure well. Bilateral Breath Sounds: Clear, Diminished   Patient extubated per order to 5L . Positive cuff  leak noted. Patient able to vocalize post extubation.   Catha Brow 11/11/2022, 3:10 PM

## 2022-11-12 DIAGNOSIS — R739 Hyperglycemia, unspecified: Secondary | ICD-10-CM

## 2022-11-12 DIAGNOSIS — E870 Hyperosmolality and hypernatremia: Secondary | ICD-10-CM

## 2022-11-12 LAB — BASIC METABOLIC PANEL
Anion gap: 12 (ref 5–15)
BUN: 18 mg/dL (ref 6–20)
CO2: 28 mmol/L (ref 22–32)
Calcium: 7.5 mg/dL — ABNORMAL LOW (ref 8.9–10.3)
Chloride: 115 mmol/L — ABNORMAL HIGH (ref 98–111)
Creatinine, Ser: 1.04 mg/dL — ABNORMAL HIGH (ref 0.44–1.00)
GFR, Estimated: >60 mL/min (ref >60)
Glucose, Bld: 150 mg/dL — ABNORMAL HIGH (ref 70–99)
Potassium: 4 mmol/L (ref 3.5–5.1)
Sodium: 155 mmol/L — ABNORMAL HIGH (ref 135–145)

## 2022-11-12 LAB — CBC
HCT: 24.2 % — ABNORMAL LOW (ref 36.0–46.0)
Hemoglobin: 7.3 g/dL — ABNORMAL LOW (ref 12.0–15.0)
MCH: 28.9 pg (ref 26.0–34.0)
MCHC: 30.2 g/dL (ref 30.0–36.0)
MCV: 95.7 fL (ref 80.0–100.0)
Platelets: 186 10*3/uL (ref 150–400)
RBC: 2.53 MIL/uL — ABNORMAL LOW (ref 3.87–5.11)
RDW: 17.2 % — ABNORMAL HIGH (ref 11.5–15.5)
WBC: 15.6 10*3/uL — ABNORMAL HIGH (ref 4.0–10.5)
nRBC: 1.3 % — ABNORMAL HIGH (ref 0.0–0.2)

## 2022-11-12 LAB — GLUCOSE, CAPILLARY
Glucose-Capillary: 109 mg/dL — ABNORMAL HIGH (ref 70–99)
Glucose-Capillary: 135 mg/dL — ABNORMAL HIGH (ref 70–99)
Glucose-Capillary: 141 mg/dL — ABNORMAL HIGH (ref 70–99)
Glucose-Capillary: 154 mg/dL — ABNORMAL HIGH (ref 70–99)
Glucose-Capillary: 166 mg/dL — ABNORMAL HIGH (ref 70–99)
Glucose-Capillary: 220 mg/dL — ABNORMAL HIGH (ref 70–99)

## 2022-11-12 LAB — FERRITIN: Ferritin: 256 ng/mL (ref 11–307)

## 2022-11-12 LAB — IRON AND TIBC
Iron: 21 ug/dL — ABNORMAL LOW (ref 28–170)
Saturation Ratios: 10 % — ABNORMAL LOW (ref 10.4–31.8)
TIBC: 214 ug/dL — ABNORMAL LOW (ref 250–450)
UIBC: 193 ug/dL

## 2022-11-12 MED ORDER — FREE WATER
300.0000 mL | Status: DC
  Administered 2022-11-12 – 2022-11-13 (×8): 300 mL

## 2022-11-12 MED ORDER — OXYCODONE HCL 5 MG PO TABS
15.0000 mg | ORAL_TABLET | ORAL | Status: DC
  Administered 2022-11-12 – 2022-11-14 (×11): 15 mg
  Filled 2022-11-12 (×11): qty 3

## 2022-11-12 MED ORDER — INSULIN ASPART 100 UNIT/ML IJ SOLN
10.0000 [IU] | INTRAMUSCULAR | Status: DC
  Administered 2022-11-12 – 2022-11-13 (×5): 10 [IU] via SUBCUTANEOUS

## 2022-11-12 NOTE — Evaluation (Signed)
Physical Therapy Evaluation Patient Details Name: Lindsey [name] MRN: [id number] DOB: 1986-09-24 Today's Date: 11/12/2022  History of Present Illness  37 year old morbidly obese female who presented 11/01/22 with generalized weakness, shortness of breath and worsening buttock wounds for about a month. +afib with RVR; sepsis; to OR 1/10, 1/11, 1/13, 1/15 for necrotizing fascitis; post-op remained on vent, sedated with propofol, levophed; extubated 1/19;  PMH-diabetes , morbid obesity, pseudotumor cerebri, vision disturbance, idiopathic intracranial hypertension, HTN, panic disorder with agoraphobia  Clinical Impression   Pt admitted secondary to problem above with deficits below. PTA patient was living with her brother and reports she was independent with ambulation. Pt currently unable to stand, but able to tolerate chair position for 10 minutes in unsupported sitting. She is on a tilt bed and plan to utilize this feature to begin weight-bearing and progress to transfers. Anticipate patient will benefit from PT to address problems listed below.Will continue to follow acutely to maximize functional mobility independence and safety.          Recommendations for follow up therapy are one component of a multi-disciplinary discharge planning process, led by the attending physician.  Recommendations may be updated based on patient status, additional functional criteria and insurance authorization.  Follow Up Recommendations Acute inpatient rehab (3hours/day)      Assistance Recommended at Discharge PRN  Patient can return home with the following   (currently not functioning at level can go home even if 2 caregivers available)    Equipment Recommendations Other (comment) (TBD)  Recommendations for Other Services  Rehab consult    Functional Status Assessment Patient has had a recent decline in their functional status and demonstrates the ability to make significant improvements in function in a  reasonable and predictable amount of time.     Precautions / Restrictions Precautions Precautions: Fall      Mobility  Bed Mobility Overal bed mobility: Needs Assistance Bed Mobility: Supine to Sit     Supine to sit: Total assist, HOB elevated     General bed mobility comments: Bed elevated into semi-chair position and pt then able to use lower rails to pull her torso forward into upright sitting with +2 min assist    Transfers                   General transfer comment: TBA; chair position did not allow egress    Ambulation/Gait                  Stairs            Wheelchair Mobility    Modified Rankin (Stroke Patients Only)       Balance                                             Pertinent Vitals/Pain Pain Assessment Pain Assessment: No/denies pain    Home Living Family/patient expects to be discharged to:: Private residence Living Arrangements: Other relatives (brother)                 Additional Comments: pt perseverating on wanting something to drink and could not get full home set-up answered by her    Prior Function Prior Level of Function : Independent/Modified Independent             Mobility Comments: walked without a device       Hand  Dominance        Extremity/Trunk Assessment   Upper Extremity Assessment Upper Extremity Assessment: Defer to OT evaluation    Lower Extremity Assessment Lower Extremity Assessment: RLE deficits/detail;LLE deficits/detail RLE Deficits / Details: AROM ankle, PROM knees and hips (by sitting up in chair position) WFL; pt unable to lift leg off bed in supine or sitting (does make effort to) LLE Deficits / Details: AROM ankle, PROM knees and hips (by sitting up in chair position) WFL; pt unable to lift leg off bed in supine or sitting (does make effort to)    Cervical / Trunk Assessment Cervical / Trunk Assessment: Other exceptions Cervical / Trunk  Exceptions: morbid obesity  Communication   Communication: No difficulties  Cognition Arousal/Alertness: Awake/alert Behavior During Therapy: Flat affect Overall Cognitive Status: No family/caregiver present to determine baseline cognitive functioning                                 General Comments: pt oriented to self, location, situation (time NT); following simple commands with delay; easily internally distracted by her thirst        General Comments General comments (skin integrity, edema, etc.): VSS on [place] O2    Exercises     Assessment/Plan    PT Assessment Patient needs continued PT services  PT Problem List Decreased strength;Decreased balance;Decreased mobility;Decreased cognition;Decreased knowledge of use of DME;Decreased safety awareness;Obesity;Decreased skin integrity       PT Treatment Interventions DME instruction;Gait training;Functional mobility training;Therapeutic activities;Therapeutic exercise;Balance training;Cognitive remediation;Patient/family education    PT Goals (Current goals can be found in the Care Plan section)  Acute Rehab PT Goals Patient Stated Goal: get something to drink PT Goal Formulation: With patient Time For Goal Achievement: 11/26/22 Potential to Achieve Goals: Good    Frequency Min 3X/week     Co-evaluation               AM-PAC PT "6 Clicks" Mobility  Outcome Measure Help needed turning from your back to your side while in a flat bed without using bedrails?: Total Help needed moving from lying on your back to sitting on the side of a flat bed without using bedrails?: Total Help needed moving to and from a bed to a chair (including a wheelchair)?: Total Help needed standing up from a chair using your arms (e.g., wheelchair or bedside chair)?: Total Help needed to walk in hospital room?: Total Help needed climbing 3-5 steps with a railing? : Total 6 Click Score: 6    End of Session Equipment Utilized  During Treatment: Oxygen Activity Tolerance: Patient tolerated treatment well Patient left: in bed;with call bell/phone within reach Nurse Communication: Mobility status PT Visit Diagnosis: Muscle weakness (generalized) (M62.81);Difficulty in walking, not elsewhere classified (R26.2)    Time: 5397-6734 PT Time Calculation (min) (ACUTE ONLY): 30 min   Charges:   PT Evaluation $PT Eval Moderate Complexity: 1 Mod PT Treatments $Therapeutic Activity: 8-22 mins         Arby Barrette, PT Acute Rehabilitation Services  Office [phone]   Rexanne Mano 11/12/2022, 1:40 PM

## 2022-11-12 NOTE — Progress Notes (Signed)
[facility] Progress Note  5 Days Post-Op  Subjective: Extubated. Seen with RN for dressing change.  Afebrile overnight.  WBC down at 15.6 Cx's with pansensitive GBS and strep mitis/oralis - on unasyn  Objective: Vital signs in last 24 hours: Temp:  [98.2 F (36.8 C)-99.3 F (37.4 C)] 98.2 F (36.8 C) (01/20 0800) Pulse Rate:  [64-93] 71 (01/20 1000) Resp:  [0-32] 19 (01/20 1000) BP: (108-177)/(46-115) 119/113 (01/20 1002) SpO2:  [90 %-100 %] 96 % (01/20 1000) FiO2 (%):  [40 %] 40 % (01/20 0347) Last BM Date : 11/12/22  Intake/Output from previous day: 01/19 0701 - 01/20 0700 In: 3659.3 [I.V.:159.6; NG/GT:3070; IV Piggyback:399.7] Out: 3690 [Urine:3250; Stool:440] Intake/Output this shift: Total I/O In: 1011.4 [NG/GT:920.8; IV Piggyback:90.5] Out: 900 [Urine:900]  PE: Gen:  Awake and alert, nad GU: indwelling foley and rectal tube in place  Wound Dressings removed and there was small amount of blue-green drainage today Pictures as noted below.  Perineal and right labial wound communicating with buttock wound with mixture of granulation tissue and fibrinous tissue/exudate at the base of the wound. Wound tracks does track several cm towards right groin - no drainage or loculations; otherwise no significant tracking.  Periwound clean.         Lab Results:  Recent Labs    11/11/22 0355 11/12/22 0325  WBC 19.4* 15.6*  HGB 7.4* 7.3*  HCT 24.1* 24.2*  PLT 166 186    BMET Recent Labs    11/11/22 0355 11/12/22 0325  NA 154* 155*  K 4.2 4.0  CL 119* 115*  CO2 28 28  GLUCOSE 139* 150*  BUN 32* 18  CREATININE 0.85 1.04*  CALCIUM 7.3* 7.5*    PT/INR No results for input(s): "LABPROT", "INR" in the last 72 hours. CMP     Component Value Date/Time   NA 155 (H) 11/12/2022 0325   K 4.0 11/12/2022 0325   CL 115 (H) 11/12/2022 0325   CO2 28 11/12/2022 0325   GLUCOSE 150 (H) 11/12/2022 0325   BUN 18 11/12/2022 0325   CREATININE 1.04 (H)  11/12/2022 0325   CALCIUM 7.5 (L) 11/12/2022 0325   PROT 5.7 (L) 11/06/2022 0050   ALBUMIN <1.5 (L) 11/06/2022 0050   AST 19 11/06/2022 0050   ALT 19 11/06/2022 0050   ALKPHOS 83 11/06/2022 0050   BILITOT 1.4 (H) 11/06/2022 0050   GFRNONAA >60 11/12/2022 0325   GFRAA >60 07/03/2016 0004   Lipase  No results found for: "LIPASE"     Studies/Results: [name] EKG SITE RITE  Result Date: 11/10/2022 If Site Rite image not attached, placement could not be confirmed due to current cardiac rhythm.  DG Humerus Left  Result Date: 11/10/2022 CLINICAL DATA:  Line placement EXAM: LEFT HUMERUS - 2 VIEW COMPARISON:  None Available. FINDINGS: No underlying fracture or dislocation. Preserved bone mineralization of the humerus. There is a PIC catheter in place with tip extending superior off the edge of the images towards the upper chest. Tip not included in the imaging field. IMPRESSION: No acute osseous abnormality.  Catheter in place Electronically Signed   By: Karen Kays M.D.   On: 11/10/2022 13:16   DG CHEST PORT 1 VIEW  Result Date: 11/10/2022 CLINICAL DATA:  PICC line insertion EXAM: PORTABLE CHEST 1 VIEW COMPARISON:  Chest x-ray dated November 09, 2022 FINDINGS: Left arm PICC is looped in the right brachiocephalic vein. Unchanged position of ETT and feeding tube. Cardiac and mediastinal contours are unchanged. Mild diffuse  interstitial opacities, likely due to pulmonary edema. No evidence of pleural effusion or pneumothorax. IMPRESSION: 1. Left arm PICC is looped in the right brachiocephalic vein. Recommend repositioning. 2. Mild pulmonary edema. These results will be called to the ordering clinician or representative by the Radiologist Assistant, and communication documented in the PACS or [employer]. Electronically Signed   By: Yetta Glassman M.D.   On: 11/10/2022 13:04    Anti-infectives: Anti-infectives (From admission, onward)    Start     Dose/Rate Route Frequency Ordered Stop    11/10/22 1600  Ampicillin-Sulbactam (UNASYN) 3 g in sodium chloride 0.9 % 100 mL IVPB        3 g 200 mL/hr over 30 Minutes Intravenous Every 6 hours 11/10/22 1438 11/13/22 2359   11/10/22 1400  Ampicillin-Sulbactam (UNASYN) 3 g in sodium chloride 0.9 % 100 mL IVPB  Status:  Discontinued        3 g 200 mL/hr over 30 Minutes Intravenous Every 6 hours 11/10/22 1113 11/10/22 1438   11/13/2022 1630  Ampicillin-Sulbactam (UNASYN) 3 g in sodium chloride 0.9 % 100 mL IVPB  Status:  Discontinued        3 g 200 mL/hr over 30 Minutes Intravenous Every 6 hours 10/26/2022 1051 11/10/22 1113   11/22/2022 1600  piperacillin-tazobactam (ZOSYN) IVPB 3.375 g  Status:  Discontinued        3.375 g 12.5 mL/hr over 240 Minutes Intravenous Every 8 hours 11/19/2022 1459 11/13/2022 1031   11/02/22 1400  linezolid (ZYVOX) IVPB 600 mg  Status:  Discontinued        600 mg 300 mL/hr over 60 Minutes Intravenous Every 12 hours 11/02/22 1222 11/04/22 0830   11/02/22 1000  vancomycin (VANCOREADY) IVPB 1250 mg/250 mL  Status:  Discontinued        1,250 mg 166.7 mL/hr over 90 Minutes Intravenous Every 12 hours 11/11/22 2135 11-Nov-2022 2137   11/02/22 1000  vancomycin (VANCOREADY) IVPB 1500 mg/300 mL  Status:  Discontinued        1,500 mg 150 mL/hr over 120 Minutes Intravenous Every 12 hours 11-11-22 2137 11/02/22 1222   11/02/22 0300  ceFEPIme (MAXIPIME) 2 g in sodium chloride 0.9 % 100 mL IVPB  Status:  Discontinued        2 g 200 mL/hr over 30 Minutes Intravenous Every 8 hours Nov 11, 2022 2135 Nov 11, 2022 2258   11/02/22 0045  vancomycin (VANCOREADY) IVPB 2000 mg/400 mL        2,000 mg 200 mL/hr over 120 Minutes Intravenous  Once 11/02/22 0030 11/02/22 0305   11/11/2022 2315  piperacillin-tazobactam (ZOSYN) IVPB 3.375 g  Status:  Discontinued        3.375 g 12.5 mL/hr over 240 Minutes Intravenous Every 8 hours 11-Nov-2022 2306 11/13/2022 1459   2022-11-11 2315  clindamycin (CLEOCIN) IVPB 900 mg  Status:  Discontinued        900 mg 100 mL/hr  over 30 Minutes Intravenous Every 8 hours 11-11-22 2306 11/02/22 1222   11-11-22 1930  vancomycin (VANCOCIN) IVPB 1000 mg/200 mL premix  Status:  Discontinued       See Hyperspace for full Linked Orders Report.   1,000 mg 200 mL/hr over 60 Minutes Intravenous  Once 11-11-2022 1823 11/02/22 0029   11/11/22 1830  ceFEPIme (MAXIPIME) 2 g in sodium chloride 0.9 % 100 mL IVPB        2 g 200 mL/hr over 30 Minutes Intravenous  Once 2022-11-11 1819 11/11/22 2016   11-Nov-2022 1830  metroNIDAZOLE (  FLAGYL) IVPB 500 mg        500 mg 100 mL/hr over 60 Minutes Intravenous  Once 10/30/2022 1819 11/22/2022 2130   10/27/2022 1830  vancomycin (VANCOCIN) IVPB 1000 mg/200 mL premix  Status:  Discontinued        1,000 mg 200 mL/hr over 60 Minutes Intravenous  Once 10/30/2022 1819 11/11/2022 1823   10/29/2022 1830  vancomycin (VANCOCIN) IVPB 1000 mg/200 mL premix  Status:  Discontinued       See Hyperspace for full Linked Orders Report.   1,000 mg 200 mL/hr over 60 Minutes Intravenous  Once 10/28/2022 1823 11/02/22 0029        Assessment/Plan S/p debridement of buttock NSTI  10/27/2022 Dr. Rosendo Gros  S/p excisional debridement buttock and perineal wound 11/20/2022 Dr. Donne Hazel S/p excisional debridement perineal wound 2022-11-12 Dr. Rosendo Gros S/P excisional debridement perineal wound and buttock wound 11/17/2022 Dr. Ninfa Linden - No further plans to return to the OR at this time. Cont abx. Cx's with GBS and strep mitis/oralis - currently on unasyn. Afebrile overnight. WBC down at 15.6 - Daily dressing changes with Dakin's  for pseudomonal drainage. Appears to be improving. Likely can d/c Monday.  - Ideally this patient would get a diverting colostomy to keep stool out of her wound but I think her body habitus precludes this.  - We will see as needed tomorrow and plan to see again Monday. Please call back earlier with any questions or concerns.   FEN - Getting TF's.  VTE - [facility] ID - Clindamycin, Zosyn, Vanc; narrowed to unasyn 1/15>>   -  below per CCM -  Septic shock - resolved ARF - improved DM  Morbid obesity - BMI 72 Hypothryoidism PCOS HLD Anxiety/Depression      LOS: 11 days   I reviewed nursing notes, hospitalist notes, last 24 h vitals and pain scores, last 48 h intake and output, and last 24 h labs and trends.   Jillyn Ledger, [facility] 11/12/2022, 11:28 AM Please see Amion for pager number during day hours 7:00am-4:30pm

## 2022-11-12 NOTE — Progress Notes (Signed)
Pharmacy Antibiotic Note  Lindsey [name] is a 37 y.o. 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  Pharmacy has been consulted for Unasyn dosing. Scr 1.19 and trending down. WBC 34.9. Patient is s/p I&D 1/10, 1/11, and 1/13 and planning to return to OR today (1/15) for further I&D. Antibiotics de-escalated from Zosyn to Unasyn as wound culture from 1/10 is growing abundant group B strep, streptococcus mitis/oralis, and prevotella biva.   WBC down to 15.6, Tmax 99.3 SCr up to 1.04 (CrCl >120 ml/min) No further plans to return to OR.   Plan: Continue Unasyn 3g IV q6h  through 11/14/22 per surgery Continue to follow OR cultures for further de-escalation Monitor for signs/symptoms of infection  Height: 5\' 6"  (167.6 cm) Weight: (!) 216 kg (476 lb 3.1 oz) IBW/kg (Calculated) : 59.3  Temp (24hrs), Avg:98.6 F (37 C), Min:98.2 F (36.8 C), Max:99.3 F (37.4 C)  Recent Labs  Lab 11/09/22 0311 11/09/22 2026 11/10/22 0335 11/10/22 1411 11/11/22 0355 11/12/22 0325  WBC 22.8* 23.7* 22.0* 22.4* 19.4* 15.6*  CREATININE 0.70 0.63 0.73  --  0.85 1.04*     Estimated Creatinine Clearance: 144 mL/min (A) (by C-G formula based on SCr of 1.04 mg/dL (H)).    Allergies  Allergen Reactions   Bupropion Other (See Comments), Palpitations and Shortness Of Breath   Anti-Inflammatory Enzyme [Nutritional Supplements] Swelling    And fluid on the brain   Hydroxyzine Other (See Comments)   Metformin Nausea Only and Other (See Comments)   Nsaids Other (See Comments), Hypertension and Swelling   Rosuvastatin Nausea And Vomiting and Other (See Comments)   Antimicrobials this admission: Cefepime 11/22/22  Flagyl Nov 22, 2022  Vancomycin 1/10 Clindamycin 1/10 Linezolid 1/10>1/11 Zosyn 1/10 >>1/15 Unasyn 1/15 >> (1/22)  Dose adjustments this admission:  Microbiology results: 2022/11/22 BCx: no growth (final) 11/22/2022 UCx: 70k yeast  1/10 Wound Cx: group B strep, strep mitis/oralis, prevotella bivia 1/10 MRSA  PCR: negative  Thank you for allowing pharmacy to be a part of this patient's care.  Luisa Hart, PharmD, BCPS Clinical Pharmacist 11/12/2022 4:26 PM   Please refer to [facility] for pharmacy phone number

## 2022-11-12 NOTE — Progress Notes (Signed)
Inpatient Rehab Admissions Coordinator:   Per therapy recommendation, patient was screened for CIR candidacy by Clemens Catholic, MS, CCC-SLP. At this time, Pt. is not at a level to tolerate the intensity of CIR   Pt. may have potential to progress to becoming a potential CIR candidate, so CIR admissions team will follow and monitor for progress and participation with therapies and place consult order if Pt. appears to be an appropriate candidate. Please contact me with any questions.   Clemens Catholic, [place], [place] Admissions Coordinator  [phone] ([place]) [phone] (office)

## 2022-11-12 NOTE — Progress Notes (Signed)
NAME:  [name], MRN:  [id number], DOB:  03-10-1986, LOS: 62 ADMISSION DATE:  10/27/2022, CONSULTATION DATE:  11/09/2022 REFERRING MD:  Dr. Alvino Chapel , CHIEF COMPLAINT:  HHS   History of Present Illness:  Montserrath Madding is a 37 y.o. female who has a PMH as below including DM2. She presented to [facility] 1/9 with fatigue, dyspnea, buttock wounds that had been getting worse. First noticed buttock wound around thanksgiving but it healed on its own. Then recurred around new years and has persisted and worsened to the point of malodorous drainage. She has also had fatigue and dyspnea. She apparently had been eating badly around the holidays and her CBG's had been reading high. Normally she states that CBG's are well controlled.   In ED, she was found to have HHS. 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   While at [facility], she also developed A.Fib RVR for which she received Amiodarone bolus x 2 followed by infusion. She was given one dose of Flagyl and was transferred to [facility] ED for surgical evaluation. En route to [facility], she converted to NSR. BP has been stable since ED presentation.   She was evaluated by surgery and is being taken to the OR for debridement tonight. She is currently on Insulin infusion, Amiodarone infusion, potassium supplementation. Her mother is with her at the bedside.  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   Significant Hospital Events: Including procedures, antibiotic start and stop dates in addition to other pertinent events   CT abdomen pelvis 1/9 > basilar atelectasis, normal stomach, appendix, bowel wall, no hernia, mild to moderate subcutaneous impaired muscular inflammatory fat stranding anterolateral aspect abdominal wall on the left, no ascites.  Soft tissue gas along the perineum on the right with medial and posterior medial right gluteal involvement consistent with Fournier's gangrene OR for debridement 1/9, 1/11, 1/13 Blood cultures 1/9 >  Urine culture 1/9 > yeast 70 K Right buttock wound culture 1/10 > GPC, GNR > abundant group B strep, abundant strep mitis PICC placed 1/12 Extubated 1/19 to BiPAP  Interim History / Subjective:  Remains extubated overnight.  Wore BiPAP overnight.  Has not had bedside swallow study yet.  Her main complaint is being thirsty.  Objective   Blood pressure (!) 150/53, pulse 64, temperature 98.2 F (36.8 C), temperature source Oral, resp. rate 14, height 5\' 6"  (1.676 m), weight (!) 216 kg, SpO2 100 %. CVP:  [19 mmHg-20 mmHg] 20 mmHg  Vent Mode: BIPAP;PCV FiO2 (%):  [40 %] 40 % Set Rate:  [16 bmp] 16 bmp PEEP:  [5 cmH20-8 cmH20] 5 cmH20 Pressure Support:  [8 cmH20-12 cmH20] 12 cmH20   Intake/Output Summary (Last 24 hours) at 11/12/2022 0948 Last data filed at 11/12/2022 0900 Gross per 24 hour  Intake 3624.19 ml  Output 3950 ml  Net -325.81 ml    Filed Weights   11/08/22 0545 11/09/22 0337 11/10/22 0343  Weight: (!) 214 kg (!) 214 kg (!) 216 kg  Examination: General: Chronically ill-appearing woman lying in bed no acute distress HEENT: [place]/AT, eyes anicteric Cardio: S1-S2, regular rate and rhythm Respiratory: Breathing comfortably on nasal cannula, CTAB. Abdomen: Obese, soft, nontender Extremities: No significant edema, no cyanosis. 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   Wound culture with group B strep, nontoxin producing, strep mitis -Continue Unasyn - dressing changes and wound care per surgery-appreciate their management - Control blood glucose -oxycodone per tube for pain control with IV PRNs during dressing changes -Zinc for wound healing, continue nutrition supplements  Acute respiratory failure due to acute encephalopathy from infection 2/2 for near gangrene, also possible contribution of evolving bilateral pulmonary infiltrates with total body volume overload, pulmonary edema At risk for OSA and OHS -Wean cephalexin as able - Nightly BiPAP -Respiratory status remains tenuous due to baseline obesity and risk for hypoventilation with high-dose of pain medications required for dressing changes.  Continue ICU monitoring.  If she is doing well hopefully she can transfer out tomorrow.  Type 2 diabetes-uncontrolled with A1C 14.7 -Home glimepiride and pioglitazone are on hold -Continue glargine 47 units twice daily and tube feeding coverage 10 (decreased from 12) units every 4 hours  - Sliding scale insulin as needed - Goal blood glucose 140-180 -Decrease Semglee to 47 u twice daily and Nov 12 u q4h  Hypertension Amlodipine, hydralazine  Blood loss anemia from 4 debridement surgeries -Transfuse for hemoglobin less than 7 or hemodynamically significant bleeding  Hypernatremia -Increase free water to 300 cc every 2 hours   Hx Hypothyroidism -Continue PTA levothyroxine   Hx Anxiety, Depression, Panic disorder, PTSD. -PTA Seroquel  Diarrhea - Banatrol   Idiopathic intracranial hypertension -When more stable can resume Diamox, which is a PTA medication  Deconditioning - PT, OT  Best Practice (right click and  "Reselect all SmartList Selections" daily)   Diet/type: tube feed DVT prophylaxis: held due to blood loss anemia GI prophylaxis: PPI Lines: NA Foley:  Yes, and it is still needed because of Fournier gangrene Code Status:  full code Last date of multidisciplinary goals of care discussion [1/19. Updated mother at bedside]  Labs   CBC: Recent Labs  Lab 11/09/22 2026 11/10/22 0335 11/10/22 1411 11/11/22 0355 11/12/22 0325  WBC 23.7* 22.0* 22.4* 19.4* 15.6*  HGB 7.5* 6.6* 8.1* 7.4* 7.3*  HCT 24.0* 21.6* 26.4* 24.1* 24.2*  MCV 94.9 96.0 94.3 96.4 95.7  PLT 202 200 175 166 186     Basic Metabolic Panel: Recent Labs  Lab 10/28/2022 1814 11/10/2022 1836 2022/11/26 0045 11/26/22 0328 Nov 26, 2022 0915 11/09/22 0311 11/09/22 2026 11/10/22 0335 11/11/22 0355 11/12/22 0325  NA 134*   < > 139 141   < > 151* 153* 153* 154* 155*  K 4.5   < > 3.6 3.8   < > 3.9 4.1 4.3 4.2 4.0  CL 101   < > 107 105   < > 114* 118* 114* 119* 115*  CO2 23   < > 23 24   < > 30 30 31 28 28   GLUCOSE 240*   < > 335* 333*   < > 158* 247* 247* 139* 150*  BUN 61*   < > 71* 66*   < > 32* 26* 29* 32* 18  CREATININE 1.32*   < > 1.19* 1.19*   < >  0.70 0.63 0.73 0.85 1.04*  CALCIUM 7.2*   < > 7.6* 7.6*   < > 8.1* 7.8* 8.0* 7.3* 7.5*  MG 3.0*  --  2.9* 3.0*  --   --   --   --   --   --   PHOS  --   --  3.6 3.6  --   --   --   --   --   --    < > = values in this interval not displayed.      Steffanie Dunn, DO 11/12/22 11:48 AM [facility]  For contact information, see Amion. If no response to pager, please call PCCM consult pager. After hours, 7PM- 7AM, please call Elink.

## 2022-11-13 LAB — BASIC METABOLIC PANEL
Anion gap: 7 (ref 5–15)
BUN: 15 mg/dL (ref 6–20)
CO2: 24 mmol/L (ref 22–32)
Calcium: 6.9 mg/dL — ABNORMAL LOW (ref 8.9–10.3)
Chloride: 113 mmol/L — ABNORMAL HIGH (ref 98–111)
Creatinine, Ser: 0.58 mg/dL (ref 0.44–1.00)
GFR, Estimated: >60 mL/min (ref >60)
Glucose, Bld: 143 mg/dL — ABNORMAL HIGH (ref 70–99)
Potassium: 3.8 mmol/L (ref 3.5–5.1)
Sodium: 144 mmol/L (ref 135–145)

## 2022-11-13 LAB — CBC
HCT: 22.7 % — ABNORMAL LOW (ref 36.0–46.0)
Hemoglobin: 6.9 g/dL — CL (ref 12.0–15.0)
MCH: 29.4 pg (ref 26.0–34.0)
MCHC: 30.4 g/dL (ref 30.0–36.0)
MCV: 96.6 fL (ref 80.0–100.0)
Platelets: 174 10*3/uL (ref 150–400)
RBC: 2.35 MIL/uL — ABNORMAL LOW (ref 3.87–5.11)
RDW: 17.8 % — ABNORMAL HIGH (ref 11.5–15.5)
WBC: 12.7 10*3/uL — ABNORMAL HIGH (ref 4.0–10.5)
nRBC: 1 % — ABNORMAL HIGH (ref 0.0–0.2)

## 2022-11-13 LAB — GLUCOSE, CAPILLARY
Glucose-Capillary: 140 mg/dL — ABNORMAL HIGH (ref 70–99)
Glucose-Capillary: 170 mg/dL — ABNORMAL HIGH (ref 70–99)
Glucose-Capillary: 212 mg/dL — ABNORMAL HIGH (ref 70–99)
Glucose-Capillary: 175 mg/dL — ABNORMAL HIGH (ref 70–99)
Glucose-Capillary: 162 mg/dL — ABNORMAL HIGH (ref 70–99)

## 2022-11-13 LAB — HEMOGLOBIN AND HEMATOCRIT, BLOOD
HCT: 25.9 % — ABNORMAL LOW (ref 36.0–46.0)
Hemoglobin: 8 g/dL — ABNORMAL LOW (ref 12.0–15.0)

## 2022-11-13 MED ORDER — METOCLOPRAMIDE HCL 5 MG/ML IJ SOLN
10.0000 mg | Freq: Once | INTRAMUSCULAR | Status: AC
  Administered 2022-11-13: 10 mg via INTRAVENOUS
  Filled 2022-11-13: qty 2

## 2022-11-13 MED ORDER — ZINC SULFATE 220 (50 ZN) MG PO CAPS
220.0000 mg | ORAL_CAPSULE | Freq: Every day | ORAL | Status: AC
  Administered 2022-11-14 – 2022-11-15 (×2): 220 mg via ORAL
  Filled 2022-11-13 (×2): qty 1

## 2022-11-13 MED ORDER — POLYETHYLENE GLYCOL 3350 17 G PO PACK
17.0000 g | PACK | Freq: Every day | ORAL | Status: DC | PRN
  Administered 2022-11-13: 17 g via ORAL
  Filled 2022-11-13: qty 1

## 2022-11-13 MED ORDER — ACETAMINOPHEN 325 MG PO TABS
650.0000 mg | ORAL_TABLET | Freq: Four times a day (QID) | ORAL | Status: DC
  Administered 2022-11-13 – 2022-11-21 (×27): 650 mg via ORAL
  Filled 2022-11-13 (×29): qty 2

## 2022-11-13 MED ORDER — PRAVASTATIN SODIUM 10 MG PO TABS
10.0000 mg | ORAL_TABLET | Freq: Every day | ORAL | Status: DC
  Administered 2022-11-13 – 2022-11-25 (×12): 10 mg via ORAL
  Filled 2022-11-13 (×14): qty 1

## 2022-11-13 MED ORDER — FAMOTIDINE 20 MG PO TABS
20.0000 mg | ORAL_TABLET | Freq: Two times a day (BID) | ORAL | Status: DC
  Administered 2022-11-13 – 2022-11-24 (×23): 20 mg via ORAL
  Filled 2022-11-13 (×24): qty 1

## 2022-11-13 MED ORDER — CALCIUM CARBONATE ANTACID 500 MG PO CHEW
200.0000 mg | CHEWABLE_TABLET | Freq: Four times a day (QID) | ORAL | Status: DC | PRN
  Administered 2022-11-20: 200 mg via ORAL
  Filled 2022-11-13 (×2): qty 1

## 2022-11-13 MED ORDER — INSULIN GLARGINE-YFGN 100 UNIT/ML ~~LOC~~ SOLN
55.0000 [IU] | Freq: Two times a day (BID) | SUBCUTANEOUS | Status: DC
  Administered 2022-11-13 – 2022-11-14 (×3): 55 [IU] via SUBCUTANEOUS
  Filled 2022-11-13 (×6): qty 0.55

## 2022-11-13 MED ORDER — VITAMIN C 500 MG PO TABS
250.0000 mg | ORAL_TABLET | Freq: Every day | ORAL | Status: DC
  Administered 2022-11-14 – 2022-11-24 (×11): 250 mg via ORAL
  Filled 2022-11-13 (×11): qty 1

## 2022-11-13 MED ORDER — HYDRALAZINE HCL 25 MG PO TABS
25.0000 mg | ORAL_TABLET | Freq: Four times a day (QID) | ORAL | Status: DC
  Administered 2022-11-13 – 2022-11-25 (×43): 25 mg via ORAL
  Filled 2022-11-13 (×47): qty 1

## 2022-11-13 MED ORDER — ACETAZOLAMIDE 250 MG PO TABS
500.0000 mg | ORAL_TABLET | Freq: Three times a day (TID) | ORAL | Status: DC
  Administered 2022-11-13 – 2022-11-23 (×30): 500 mg via ORAL
  Filled 2022-11-13 (×36): qty 2

## 2022-11-13 MED ORDER — FERROUS SULFATE 325 (65 FE) MG PO TABS
325.0000 mg | ORAL_TABLET | Freq: Two times a day (BID) | ORAL | Status: DC
  Administered 2022-11-13 – 2022-11-25 (×23): 325 mg via ORAL
  Filled 2022-11-13 (×24): qty 1

## 2022-11-13 MED ORDER — LEVOTHYROXINE SODIUM 75 MCG PO TABS
75.0000 ug | ORAL_TABLET | Freq: Every day | ORAL | Status: DC
  Administered 2022-11-14 – 2022-11-24 (×11): 75 ug via ORAL
  Filled 2022-11-13 (×12): qty 1

## 2022-11-13 MED ORDER — ACETAMINOPHEN 325 MG PO TABS
650.0000 mg | ORAL_TABLET | Freq: Four times a day (QID) | ORAL | Status: DC | PRN
  Administered 2022-11-15: 650 mg via ORAL
  Filled 2022-11-13: qty 2

## 2022-11-13 MED ORDER — SENNOSIDES-DOCUSATE SODIUM 8.6-50 MG PO TABS: 1.0000 | ORAL_TABLET | Freq: Every evening | ORAL | Status: DC | PRN

## 2022-11-13 MED ORDER — POTASSIUM CHLORIDE 20 MEQ PO PACK
60.0000 meq | PACK | Freq: Once | ORAL | Status: AC
  Administered 2022-11-13: 60 meq
  Filled 2022-11-13: qty 3

## 2022-11-13 MED ORDER — INSULIN ASPART 100 UNIT/ML IJ SOLN
15.0000 [IU] | Freq: Three times a day (TID) | INTRAMUSCULAR | Status: DC
  Administered 2022-11-13 – 2022-11-14 (×3): 15 [IU] via SUBCUTANEOUS

## 2022-11-13 MED ORDER — AMLODIPINE BESYLATE 10 MG PO TABS
10.0000 mg | ORAL_TABLET | Freq: Every day | ORAL | Status: DC
  Administered 2022-11-14 – 2022-11-24 (×11): 10 mg via ORAL
  Filled 2022-11-13 (×11): qty 1

## 2022-11-13 MED ORDER — FUROSEMIDE 10 MG/ML IJ SOLN
40.0000 mg | Freq: Two times a day (BID) | INTRAMUSCULAR | Status: AC
  Administered 2022-11-13 (×2): 40 mg via INTRAVENOUS
  Filled 2022-11-13 (×2): qty 4

## 2022-11-13 MED ORDER — GABAPENTIN 600 MG PO TABS
600.0000 mg | ORAL_TABLET | Freq: Three times a day (TID) | ORAL | Status: DC
  Administered 2022-11-13 (×2): 600 mg via ORAL
  Filled 2022-11-13 (×4): qty 1

## 2022-11-13 NOTE — Progress Notes (Signed)
While patient was getting a bath this RN saw the flexi-seal had came out. Stool was in wound. This RN was unable to find the patients rectum. Another nurse who had this patient prior to today was able to come in and replace the flexi. Stool was rinsed out with Dankins solution, wound care performed and charted.

## 2022-11-13 NOTE — Progress Notes (Signed)
eLink Physician-Brief Progress Note Patient Name: Lindsey [name] DOB: 07-14-1986 MRN: [id number]   Date of Service  11/13/2022  HPI/Events of Note  Hg at 6.9, no active bleeding.was at 7.3 Cr 1.04 S/p I and D, fourniers.   eICU Interventions  Follow Hg at 8 AM, if drops further transfuse.       Intervention Category Intermediate Interventions: Other:  Elmer Sow 11/13/2022, 4:27 AM

## 2022-11-13 NOTE — Progress Notes (Signed)
NAME:  Lindsey [name], MRN:  [id number], DOB:  21-Nov-1985, LOS: 18 ADMISSION DATE:  11/12/2022, CONSULTATION DATE:  11/10/2022 REFERRING MD:  Dr. Alvino Chapel , CHIEF COMPLAINT:  HHS   History of Present Illness:  Lindsey [name] is a 37 y.o. female who has a PMH as below including DM2. She presented to [facility] 1/9 with fatigue, dyspnea, buttock wounds that had been getting worse. First noticed buttock wound around thanksgiving but it healed on its own. Then recurred around new years and has persisted and worsened to the point of malodorous drainage. She has also had fatigue and dyspnea. She apparently had been eating badly around the holidays and her CBG's had been reading high. Normally she states that CBG's are well controlled.   In ED, she was found to have HHS. 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   While at [facility], she also developed A.Fib RVR for which she received Amiodarone bolus x 2 followed by infusion. She was given one dose of Flagyl and was transferred to [facility] ED for surgical evaluation. En route to [facility], she converted to NSR. BP has been stable since ED presentation.   She was evaluated by surgery and is being taken to the OR for debridement tonight. She is currently on Insulin infusion, Amiodarone infusion, potassium supplementation. Her mother is with her at the bedside.  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   Significant Hospital Events: Including procedures, antibiotic start and stop dates in addition to other pertinent events   CT abdomen pelvis 1/9 > basilar atelectasis, normal stomach, appendix, bowel wall, no hernia, mild to moderate subcutaneous impaired muscular inflammatory fat stranding anterolateral aspect abdominal wall on the left, no ascites.  Soft tissue gas along the perineum on the right with medial and posterior medial right gluteal involvement consistent with Fournier's gangrene OR for debridement 1/9, 1/11, 1/13 Blood cultures 1/9 >  Urine culture 1/9 > yeast 70 K Right buttock wound culture 1/10 > GPC, GNR > abundant group B strep, abundant strep mitis PICC placed 1/12 Extubated 1/19 to BiPAP  Interim History / Subjective:  Denies complaints. Eating well- cleared her breakfast tray. Less thirsty today.  Objective   Blood pressure (!) 133/42, pulse 84, temperature 99.8 F (37.7 C), temperature source Oral, resp. rate (!) 35, height 5\' 6"  (1.676 m), weight (!) 216 kg, SpO2 100 %. CVP:  [20 mmHg] 20 mmHg  Vent Mode: BIPAP;PCV FiO2 (%):  [40 %] 40 % Set Rate:  [16 bmp] 16 bmp PEEP:  [5 cmH20] 5 cmH20   Intake/Output Summary (Last 24 hours) at 11/13/2022 0816 Last data filed at 11/13/2022 0600 Gross per 24 hour  Intake 3953.02 ml  Output 2600 ml  Net 1353.02 ml    Filed Weights   11/08/22 0545 11/09/22 0337 11/10/22 0343  Weight: (!) 214 kg (!) 214 kg (!) 216 kg    Examination: General: chronically ill appearing woman lying in bed in NAD HEENT: [place]/AT, eyes anicteric Cardio: S1-S2,  regular rate and rhythm Respiratory: Breathing comfortably nasal cannula, CTAB. Abdomen: Obese, soft, nontender Extremities: Pedal edema, no cyanosis.   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   Wound culture with group B strep, nontoxin producing, strep mitis -Complete course of Unasyn today - Dressing changes and wound care per surgery.  Appreciate their management. - Dilaudid as needed for dressing changes - Continue oxycodone as needed - Zinc for wound healing -Eating adequately, no longer needing tube feeds  Acute respiratory failure due to acute encephalopathy from infection 2/2 for near gangrene, also possible contribution of evolving bilateral pulmonary infiltrates with total body volume overload, pulmonary edema At risk for OSA and OHS - Wean oxygen to maintain SpO2 greater than 90% - Nightly BiPAP - Stable to transfer out of ICU today  Type 2 diabetes-uncontrolled with A1C 14.7 -Home glimepiride and pioglitazone are on hold -Patient to basal bolus insulin-glargine 55 units twice daily +15 units NovoLog 3 times daily AC.  Hold if not eating.   -Sliding scale insulin  -Goal blood glucose 140-180 -We discussed that it will be critical for her to control her diabetes better.  She will require insulin at home to control her diabetes.  Exercise and dietary modifications will be necessary.  Diabetes education needed before discharge. - Resume PTA pravastatin  Hypertension -Amlodipine and hydralazine  Blood loss anemia from 4 debridement surgeries - Repeat hemoglobin noted.  Will transfuse if hemoglobin persistently below 7  Hypernatremia, corrected - Ad lib. water intake   Hx Hypothyroidism -Continue PTA levothyroxine   Hx Anxiety, Depression, Panic disorder, PTSD. -Continue PTA Seroquel   Idiopathic intracranial hypertension -resume PTA diamox TID  Deconditioning - PT, OT -increase OOB mobility  Stable to transfer out of ICU today. Will ensure surgery is ok with doing her dressing changes on the floor.   Best Practice (right click and "Reselect all  SmartList Selections" daily)   Diet/type: reg DVT prophylaxis: heparin [place] GI prophylaxis: N/A Lines: NA Foley:  Yes, and it is still needed because of Fournier gangrene Code Status:  full code Last date of multidisciplinary goals of care discussion [1/19. Updated mother at bedside]  Labs   CBC: Recent Labs  Lab 11/10/22 0335 11/10/22 1411 11/11/22 0355 11/12/22 0325 11/13/22 0331  WBC 22.0* 22.4* 19.4* 15.6* 12.7*  HGB 6.6* 8.1* 7.4* 7.3* 6.9*  HCT 21.6* 26.4* 24.1* 24.2* 22.7*  MCV 96.0 94.3 96.4 95.7 96.6  PLT 200 175 166 186 174     Basic Metabolic Panel: Recent Labs  Lab 10/27/2022 0045 10/26/2022 0328 11/09/2022 0915 11/09/22 2026 11/10/22 0335 11/11/22 0355 11/12/22 0325 11/13/22 0331  NA 139 141   < > 153* 153* 154* 155* 144  K 3.6 3.8   < > 4.1 4.3 4.2 4.0 3.8  CL 107 105   < > 118* 114* 119* 115* 113*  CO2 23 24   < > 30 31 28 28 24   GLUCOSE 335* 333*   < > 247* 247* 139* 150* 143*  BUN 71* 66*   < > 26* 29* 32* 18 15  CREATININE 1.19* 1.19*   < > 0.63 0.73 0.85 1.04* 0.58  CALCIUM 7.6* 7.6*   < > 7.8* 8.0* 7.3* 7.5* 6.9*  MG 2.9* 3.0*  --   --   --   --   --   --  PHOS 3.6 3.6  --   --   --   --   --   --    < > = values in this interval not displayed.      Lindsey Dunn, DO 11/13/22 8:25 AM [facility]  For contact information, see Amion. If no response to pager, please call PCCM consult pager. After hours, 7PM- 7AM, please call Elink.

## 2022-11-13 NOTE — Inpatient Diabetes Management (Signed)
Inpatient Diabetes Program Recommendations  AACE/ADA: New Consensus Statement on Inpatient Glycemic Control (2015)  Target Ranges:  Prepandial:   less than 140 mg/dL      Peak postprandial:   less than 180 mg/dL (1-2 hours)      Critically ill patients:  140 - 180 mg/dL   Lab Results  Component Value Date   GLUCAP 212 (H) 11/13/2022   HGBA1C 14.7 (H) 11/05/2022    Review of Glycemic Control  Diabetes history: DM Outpatient Diabetes medications: Amaryl 2 mg QD, Actos 45 QD Current orders for Inpatient glycemic control:  Semglee 55 units BID Novolog 0-20 units Q4H Novolog 15 units TID  Noted consult for Diabetes Coordinator. Diabetes Coordinator is not on campus over the weekend but available by pager from 8am to 5pm for questions or concerns. Chart reviewed.  Noted continuous feeds stopped this morning and she now has a diet ordered.  Basal was increased to Semglee 55 units BID from 47 units BID.   Please Consider:  Novolog 0-15 units TID with meals and 0-5 units QHS as she has a diet ordered. May need to decrease basal insulin as tube feeds have been discontinued.   Will plan to see patient on 1/22 for education.  Thank you, Reche Dixon, MSN, [place] Diabetes Coordinator Inpatient Diabetes Program [phone] (team pager from 8a-5p)

## 2022-11-13 NOTE — Progress Notes (Signed)
[place] Progress Note Patient Name: Lindsey [name] DOB: [birth year] MRN: [id number]   Date of Service  11/13/2022  HPI/Events of Note  Pt complains of epigastric discomfort.   eICU Interventions  Placed order for famotidine BID and tums PRN.  Will follow response.         [place] 11/13/2022, 8:26 PM

## 2022-11-14 ENCOUNTER — Inpatient Hospital Stay (HOSPITAL_COMMUNITY): Payer: Medicaid Other

## 2022-11-14 DIAGNOSIS — E1100 Type 2 diabetes mellitus with hyperosmolarity without nonketotic hyperglycemic-hyperosmolar coma (NKHHC): Secondary | ICD-10-CM

## 2022-11-14 DIAGNOSIS — N179 Acute kidney failure, unspecified: Secondary | ICD-10-CM

## 2022-11-14 LAB — COMPREHENSIVE METABOLIC PANEL
ALT: 20 U/L (ref 0–44)
AST: 17 U/L (ref 15–41)
Albumin: 1.6 g/dL — ABNORMAL LOW (ref 3.5–5.0)
Alkaline Phosphatase: 96 U/L (ref 38–126)
Anion gap: 11 (ref 5–15)
BUN: 31 mg/dL — ABNORMAL HIGH (ref 6–20)
CO2: 27 mmol/L (ref 22–32)
Calcium: 8.3 mg/dL — ABNORMAL LOW (ref 8.9–10.3)
Chloride: 104 mmol/L (ref 98–111)
Creatinine, Ser: 1.69 mg/dL — ABNORMAL HIGH (ref 0.44–1.00)
GFR, Estimated: 40 mL/min — ABNORMAL LOW (ref >60)
Glucose, Bld: 135 mg/dL — ABNORMAL HIGH (ref 70–99)
Potassium: 5 mmol/L (ref 3.5–5.1)
Sodium: 142 mmol/L (ref 135–145)
Total Bilirubin: 0.5 mg/dL (ref 0.3–1.2)
Total Protein: 6.6 g/dL (ref 6.5–8.1)

## 2022-11-14 LAB — CBC WITH DIFFERENTIAL/PLATELET
Abs Immature Granulocytes: 0.13 10*3/uL — ABNORMAL HIGH (ref 0.00–0.07)
Basophils Absolute: 0 10*3/uL (ref 0.0–0.1)
Basophils Relative: 0 %
Eosinophils Absolute: 0.1 10*3/uL (ref 0.0–0.5)
Eosinophils Relative: 1 %
HCT: 27.3 % — ABNORMAL LOW (ref 36.0–46.0)
Hemoglobin: 7.8 g/dL — ABNORMAL LOW (ref 12.0–15.0)
Immature Granulocytes: 1 %
Lymphocytes Relative: 14 %
Lymphs Abs: 2.3 10*3/uL (ref 0.7–4.0)
MCH: 28.7 pg (ref 26.0–34.0)
MCHC: 28.6 g/dL — ABNORMAL LOW (ref 30.0–36.0)
MCV: 100.4 fL — ABNORMAL HIGH (ref 80.0–100.0)
Monocytes Absolute: 1.2 10*3/uL — ABNORMAL HIGH (ref 0.1–1.0)
Monocytes Relative: 7 %
Neutro Abs: 12.3 10*3/uL — ABNORMAL HIGH (ref 1.7–7.7)
Neutrophils Relative %: 77 %
Platelets: 226 10*3/uL (ref 150–400)
RBC: 2.72 MIL/uL — ABNORMAL LOW (ref 3.87–5.11)
RDW: 17.6 % — ABNORMAL HIGH (ref 11.5–15.5)
WBC: 16.1 10*3/uL — ABNORMAL HIGH (ref 4.0–10.5)
nRBC: 0.2 % (ref 0.0–0.2)

## 2022-11-14 LAB — BLOOD GAS, ARTERIAL
Acid-Base Excess: 4.4 mmol/L — ABNORMAL HIGH (ref 0.0–2.0)
Bicarbonate: 30.8 mmol/L — ABNORMAL HIGH (ref 20.0–28.0)
Drawn by: 42783
O2 Saturation: 96.8 %
Patient temperature: 37.1
pCO2 arterial: 52 mmHg — ABNORMAL HIGH (ref 32–48)
pH, Arterial: 7.38 (ref 7.35–7.45)
pO2, Arterial: 72 mmHg — ABNORMAL LOW (ref 83–108)

## 2022-11-14 LAB — GLUCOSE, CAPILLARY
Glucose-Capillary: 115 mg/dL — ABNORMAL HIGH (ref 70–99)
Glucose-Capillary: 123 mg/dL — ABNORMAL HIGH (ref 70–99)
Glucose-Capillary: 126 mg/dL — ABNORMAL HIGH (ref 70–99)
Glucose-Capillary: 131 mg/dL — ABNORMAL HIGH (ref 70–99)
Glucose-Capillary: 134 mg/dL — ABNORMAL HIGH (ref 70–99)
Glucose-Capillary: 88 mg/dL (ref 70–99)
Glucose-Capillary: 92 mg/dL (ref 70–99)

## 2022-11-14 MED ORDER — OXYCODONE HCL 5 MG PO TABS
5.0000 mg | ORAL_TABLET | Freq: Four times a day (QID) | ORAL | Status: DC
  Administered 2022-11-14 – 2022-11-15 (×5): 5 mg via ORAL
  Filled 2022-11-14 (×5): qty 1

## 2022-11-14 MED ORDER — ZINC OXIDE 12.8 % EX OINT
TOPICAL_OINTMENT | Freq: Three times a day (TID) | CUTANEOUS | Status: DC
  Filled 2022-11-14 (×2): qty 56.7

## 2022-11-14 MED ORDER — LACTATED RINGERS IV SOLN: INTRAVENOUS | Status: DC

## 2022-11-14 MED ORDER — DOCUSATE SODIUM 100 MG PO CAPS
100.0000 mg | ORAL_CAPSULE | Freq: Two times a day (BID) | ORAL | Status: DC
  Administered 2022-11-14 – 2022-11-24 (×20): 100 mg via ORAL
  Filled 2022-11-14 (×21): qty 1

## 2022-11-14 MED ORDER — GABAPENTIN 100 MG PO CAPS
100.0000 mg | ORAL_CAPSULE | Freq: Three times a day (TID) | ORAL | Status: DC
  Administered 2022-11-15 – 2022-11-21 (×21): 100 mg via ORAL
  Filled 2022-11-14 (×21): qty 1

## 2022-11-14 MED ORDER — LIVING WELL WITH DIABETES BOOK
Freq: Once | Status: AC
  Filled 2022-11-14: qty 1

## 2022-11-14 MED ORDER — OXYCODONE HCL 5 MG PO TABS: 15.0000 mg | ORAL_TABLET | ORAL | Status: DC

## 2022-11-14 MED ORDER — INSULIN STARTER KIT- PEN NEEDLES (ENGLISH)
1.0000 | Freq: Once | Status: AC
  Administered 2022-11-14: 1
  Filled 2022-11-14: qty 1

## 2022-11-14 NOTE — Evaluation (Signed)
Occupational Therapy Evaluation Patient Details Name: Lindsey [name] MRN: [id number] DOB: [birth year] Today's Date: 11/14/2022   History of Present Illness 37 year old morbidly obese female who presented 11-20-2022 with generalized weakness, shortness of breath and worsening buttock wounds for about a month. +afib with RVR; sepsis; to OR 1/10, 1/11, 1/13, 1/15 for necrotizing fascitis; post-op remained on vent, sedated with propofol, levophed; extubated 1/19;  PMH-diabetes , morbid obesity, pseudotumor cerebri, vision disturbance, idiopathic intracranial hypertension, HTN, panic disorder with agoraphobia   Clinical Impression   Patient admitted for the diagnosis above.  PTA it appears she lived at home with her brother.  Unable to ascertain brother's availability to assist, and her self performance at home.  Patient very lethargic, woke from a deep sleep, and very confused for the first few minutes.  Patent very weak, and unable to move her lower extremities, and needing Max A for basic rolling in bed.  OT to follow in the acute setting to address deficits listed below, and to help transition patient to the next level.         Recommendations for follow up therapy are one component of a multi-disciplinary discharge planning process, led by the attending physician.  Recommendations may be updated based on patient status, additional functional criteria and insurance authorization.   Follow Up Recommendations  Other (comment) (AIR is following.  AIR vs SNF depending on progress)     Assistance Recommended at Discharge Frequent or constant Supervision/Assistance  Patient can return home with the following Help with stairs or ramp for entrance;Assist for transportation;Assistance with cooking/housework;Two people to help with walking and/or transfers;Two people to help with bathing/dressing/bathroom    Functional Status Assessment  Patient has had a recent decline in their functional status and  demonstrates the ability to make significant improvements in function in a reasonable and predictable amount of time.  Equipment Recommendations  Wheelchair (measurements OT);Wheelchair cushion (measurements OT)    Recommendations for Other Services       Precautions / Restrictions Precautions Precautions: Fall Precaution Comments: Rectal Tube, watch O2 and HR Restrictions Weight Bearing Restrictions: No      Mobility Bed Mobility Overal bed mobility: Needs Assistance Bed Mobility: Rolling Rolling: Max assist   Supine to sit: Max assist          Transfers                          Balance                                           ADL either performed or assessed with clinical judgement   ADL Overall ADL's : Needs assistance/impaired     Grooming: Wash/dry hands;Wash/dry face;Set up;Bed level   Upper Body Bathing: Maximal assistance;Bed level   Lower Body Bathing: Total assistance;Bed level   Upper Body Dressing : Moderate assistance;Bed level   Lower Body Dressing: Total assistance;Bed level   Toilet Transfer: Total assistance   Toileting- Clothing Manipulation and Hygiene: Total assistance;Bed level               Vision Baseline Vision/History: 1 Wears glasses Patient Visual Report: No change from baseline       Perception     Praxis      Pertinent Vitals/Pain Pain Assessment Pain Assessment: Faces Faces Pain Scale: No hurt Pain Intervention(s): Monitored during session  Hand Dominance Right   Extremity/Trunk Assessment Upper Extremity Assessment Upper Extremity Assessment: Generalized weakness   Lower Extremity Assessment Lower Extremity Assessment: Defer to PT evaluation   Cervical / Trunk Assessment Cervical / Trunk Assessment: Other exceptions Cervical / Trunk Exceptions: morbid obesity   Communication Communication Communication: No difficulties   Cognition Arousal/Alertness: Lethargic,  Suspect due to medications Behavior During Therapy: Flat affect Overall Cognitive Status: Impaired/Different from baseline Area of Impairment: Orientation, Attention, Memory, Following commands, Safety/judgement, Awareness, Problem solving                 Orientation Level: Person, Place, Situation Current Attention Level: Sustained Memory: Decreased short-term memory Following Commands: Follows multi-step commands inconsistently Safety/Judgement: Decreased awareness of deficits Awareness: Emergent Problem Solving: Slow processing, Requires verbal cues General Comments: Patient confabulating stories, woken up from sleeping.  Stating therapist was yelling at her grandma.  No one in the room with aptient and therapist                      Home Living Family/patient expects to be discharged to:: Private residence Living Arrangements: Other relatives Available Help at Discharge: Family;Available PRN/intermittently Type of Home: House Home Access: Stairs to enter [employer] of Steps: 3   Home Layout: One level     Bathroom Shower/Tub: Occupational psychologist: Standard     Home Equipment: None          Prior Functioning/Environment Prior Level of Function : Independent/Modified Independent             Mobility Comments: walked without a device ADLs Comments: Helped with iADL as able, completed showers        OT Problem List: Decreased strength;Decreased range of motion;Decreased activity tolerance;Impaired balance (sitting and/or standing);Obesity;Decreased safety awareness;Pain;Decreased cognition      OT Treatment/Interventions: Self-care/ADL training;Therapeutic exercise;Therapeutic activities;Patient/family education;DME and/or AE instruction;Balance training    OT Goals(Current goals can be found in the care plan section) Acute Rehab OT Goals Patient Stated Goal: None stated Time For Goal Achievement: 11/28/22 Potential to  Achieve Goals: Good ADL Goals Pt Will Perform Grooming: with set-up;sitting Pt Will Perform Upper Body Bathing: with min assist;sitting Pt Will Perform Upper Body Dressing: with min assist;sitting Pt/caregiver will Perform Home Exercise Program: Increased strength;Both right and left upper extremity;With Supervision Additional ADL Goal #1: Roll side to side with use of side rails and Mod A to increase ind with toileting. Additional ADL Goal #2: Supine to sit with Mod A of 2 for safety to increase ind with upper body ADL  OT Frequency: Min 2X/week    Co-evaluation              AM-PAC OT "6 Clicks" Daily Activity     Outcome Measure Help from another person eating meals?: A Little Help from another person taking care of personal grooming?: A Little Help from another person toileting, which includes using toliet, bedpan, or urinal?: Total Help from another person bathing (including washing, rinsing, drying)?: A Lot Help from another person to put on and taking off regular upper body clothing?: A Lot Help from another person to put on and taking off regular lower body clothing?: Total 6 Click Score: 12   End of Session Equipment Utilized During Treatment: Oxygen Nurse Communication: Mobility status  Activity Tolerance: Patient limited by lethargy Patient left: in bed;with call bell/phone within reach;with nursing/sitter in room  OT Visit Diagnosis: Muscle weakness (generalized) (M62.81)  Time: 7371-0626 OT Time Calculation (min): 21 min Charges:  OT General Charges $OT Visit: 1 Visit OT Evaluation $OT Eval Moderate Complexity: 1 Mod  11/14/2022  RP, OTR/L  Acute Rehabilitation Services  Office:  [phone]   Suzanna Obey 11/14/2022, 1:51 PM

## 2022-11-14 NOTE — Hospital Course (Signed)
Lindsey [name] is a 37 yo female with PMH DMII, morbid obesity, probable OHS, depression/anxiety, HLD, HTN, hypothyroidism, PCOS, pseudotumor cerebri, PTSD who presented with worsening buttocks wounds.  Initially started around Thanksgiving and essentially had slowly progressively worsened prompting presentation to the ER. CT abdomen/pelvis performed and was consistent with Fournier's gangrene involving the perineum on the right and medial/posteromedial aspects of the right gluteal region. Significant Hospital Events:  CT abdomen pelvis 1/9 > basilar atelectasis, normal stomach, appendix, bowel wall, no hernia, mild to moderate subcutaneous impaired muscular inflammatory fat stranding anterolateral aspect abdominal wall on the left, no ascites.  Soft tissue gas along the perineum on the right with medial and posterior medial right gluteal involvement consistent with Fournier's gangrene OR for debridement 1/9, 1/11, 11/16/2022 Blood cultures 1/9 > negative x  days Urine culture 1/9 > yeast 70 K Right buttock wound culture 1/10 > GPC, GNR > abundant group B strep, abundant strep mitis, Prevotella PICC placed 1/12 Extubated 1/19 to BiPAP

## 2022-11-14 NOTE — Progress Notes (Signed)
Progress Note    Lindsey [name]   HBZ:[id number]  DOB: Mar 23, 1986  DOA: 11/21/2022     13 PCP: Lindsey Cruel, MD  Initial CC: buttock wound  Hospital Course: [name] is a 37 yo female with PMH DMII, morbid obesity, probable OHS, depression/anxiety, HLD, HTN, hypothyroidism, PCOS, pseudotumor cerebri, PTSD who presented with worsening buttocks wounds.  Initially started around Thanksgiving and essentially had slowly progressively worsened prompting presentation to the ER. CT abdomen/pelvis performed and was consistent with Fournier's gangrene involving the perineum on the right and medial/posteromedial aspects of the right gluteal region. Significant Hospital Events:  CT abdomen pelvis 1/9 > basilar atelectasis, normal stomach, appendix, bowel wall, no hernia, mild to moderate subcutaneous impaired muscular inflammatory fat stranding anterolateral aspect abdominal wall on the left, no ascites.  Soft tissue gas along the perineum on the right with medial and posterior medial right gluteal involvement consistent with Fournier's gangrene OR for debridement 1/9, 1/11, 1/13 Blood cultures 1/9 > negative x  days Urine culture 1/9 > yeast 70 K Right buttock wound culture 1/10 > GPC, GNR > abundant group B strep, abundant strep mitis, Prevotella PICC placed 1/12 Extubated 1/19 to BiPAP  Interval History:  No events overnight.  Patient very drowsy but arouses easily to voice this morning then falls back asleep.  Protecting airway and oxygenation adequate.  Mother also present bedside and update given with questions answered.  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   Wound culture with group B strep, nontoxin producing, strep mitis, Prevotella - s/p debridement on 1/9, 1/11, 1/13, 2022-11-29 -Completed course of Unasyn - Dressing changes and wound care per surgery.  Appreciate their management. - Dilaudid as needed for dressing changes - decrease oxy  dosing (may need to make PRN again) - Zinc for wound healing -Eating adequately, no longer needing tube feeds   Acute respiratory failure due to acute encephalopathy from infection 2/2 for near gangrene, also possible contribution of evolving bilateral pulmonary infiltrates with total body volume overload, pulmonary edema At risk for OSA and OHS - Wean oxygen to maintain SpO2 greater than 90% - Nightly BiPAP   Type 2 diabetes-uncontrolled with A1C 14.7 -Home glimepiride and pioglitazone are on hold - continue semglee - continue SSI and CBG monitoring   AKI - creat increased to 1.69 on 1/22 - start low rate IVF today   Hypertension -Amlodipine and hydralazine   Blood loss anemia from 4 debridement surgeries - Repeat hemoglobin noted.  Will transfuse if hemoglobin persistently below 7 -Continue trending.  Hemoglobin 7.8 g/dL this morning   Hypernatremia - resolved  - Ad lib. water intake   Hx Hypothyroidism -Continue PTA levothyroxine   Hx Anxiety, Depression, Panic disorder, PTSD. -Continue PTA Seroquel   Idiopathic intracranial hypertension -continue PTA diamox TID   Deconditioning - PT, OT -increase OOB mobility   Old records reviewed in assessment of this patient  Antimicrobials: Unasyn completed 11/13/2022  DVT prophylaxis:  heparin injection 7,500 Units Start: 11/11/22 1400 SCDs Start: 11/12/2022 2241   Code Status:   Code Status: Full Code  Mobility Assessment (last 72 hours)     Mobility Assessment     Row Name 11/12/22 1327           What is the highest level of mobility based on the progressive mobility assessment? Level 1 (Bedfast) - Unable to balance while sitting on edge of bed  Barriers to discharge:  Disposition Plan:  TBD Status is: Inpt  Objective: Blood pressure (!) 145/65, pulse 95, temperature 98.8 F (37.1 C), temperature source Oral, resp. rate (!) 24, height 5\' 6"  (1.676 m), weight (!) 223.3 kg, SpO2 100 %.   Examination:  Physical Exam Constitutional:      General: She is not in acute distress.    Appearance: She is obese.     Comments: Sedated but arouses to voice and can answer questions easily then falls back asleep  HENT:     Head: Normocephalic and atraumatic.     Mouth/Throat:     Mouth: Mucous membranes are moist.  Eyes:     Extraocular Movements: Extraocular movements intact.  Cardiovascular:     Rate and Rhythm: Normal rate and regular rhythm.  Pulmonary:     Effort: Pulmonary effort is normal.     Breath sounds: Normal breath sounds.  Abdominal:     General: Bowel sounds are normal. There is no distension.     Palpations: Abdomen is soft.     Tenderness: There is no abdominal tenderness.  Musculoskeletal:        General: Swelling present.     Cervical back: Normal range of motion and neck supple.     Comments: Generalized edema  Neurological:     Comments: Follows commands and moves all 4 extremities      Consultants:  General surgery  Procedures:    Data Reviewed: Results for orders placed or performed during the hospital encounter of 11/17/2022 (from the past 24 hour(s))  Glucose, capillary     Status: Abnormal   Collection Time: 11/13/22 12:34 PM  Result Value Ref Range   Glucose-Capillary 212 (H) 70 - 99 mg/dL  Glucose, capillary     Status: Abnormal   Collection Time: 11/13/22  3:49 PM  Result Value Ref Range   Glucose-Capillary 175 (H) 70 - 99 mg/dL  Glucose, capillary     Status: Abnormal   Collection Time: 11/13/22  8:10 PM  Result Value Ref Range   Glucose-Capillary 162 (H) 70 - 99 mg/dL  Glucose, capillary     Status: Abnormal   Collection Time: 11/13/22 11:54 PM  Result Value Ref Range   Glucose-Capillary 131 (H) 70 - 99 mg/dL  Glucose, capillary     Status: Abnormal   Collection Time: 11/14/22  4:32 AM  Result Value Ref Range   Glucose-Capillary 115 (H) 70 - 99 mg/dL  Glucose, capillary     Status: Abnormal   Collection Time: 11/14/22  7:50  AM  Result Value Ref Range   Glucose-Capillary 123 (H) 70 - 99 mg/dL  Blood gas, arterial     Status: Abnormal   Collection Time: 11/14/22 10:43 AM  Result Value Ref Range   pH, Arterial 7.38 7.35 - 7.45   pCO2 arterial 52 (H) 32 - 48 mmHg   pO2, Arterial 72 (L) 83 - 108 mmHg   Bicarbonate 30.8 (H) 20.0 - 28.0 mmol/L   Acid-Base Excess 4.4 (H) 0.0 - 2.0 mmol/L   O2 Saturation 96.8 %   Patient temperature 37.1    Collection site RIGHT RADIAL    Drawn by 11/16/22    Allens test (pass/fail) PASS PASS  Comprehensive metabolic panel     Status: Abnormal   Collection Time: 11/14/22 10:47 AM  Result Value Ref Range   Sodium 142 135 - 145 mmol/L   Potassium 5.0 3.5 - 5.1 mmol/L   Chloride 104 98 - 111  mmol/L   CO2 27 22 - 32 mmol/L   Glucose, Bld 135 (H) 70 - 99 mg/dL   BUN 31 (H) 6 - 20 mg/dL   Creatinine, Ser 1.69 (H) 0.44 - 1.00 mg/dL   Calcium 8.3 (L) 8.9 - 10.3 mg/dL   Total Protein 6.6 6.5 - 8.1 g/dL   Albumin 1.6 (L) 3.5 - 5.0 g/dL   AST 17 15 - 41 U/L   ALT 20 0 - 44 U/L   Alkaline Phosphatase 96 38 - 126 U/L   Total Bilirubin 0.5 0.3 - 1.2 mg/dL   GFR, Estimated 40 (L) >60 mL/min   Anion gap 11 5 - 15  CBC with Differential/Platelet     Status: Abnormal   Collection Time: 11/14/22 10:47 AM  Result Value Ref Range   WBC 16.1 (H) 4.0 - 10.5 K/uL   RBC 2.72 (L) 3.87 - 5.11 MIL/uL   Hemoglobin 7.8 (L) 12.0 - 15.0 g/dL   HCT 27.3 (L) 36.0 - 46.0 %   MCV 100.4 (H) 80.0 - 100.0 fL   MCH 28.7 26.0 - 34.0 pg   MCHC 28.6 (L) 30.0 - 36.0 g/dL   RDW 17.6 (H) 11.5 - 15.5 %   Platelets 226 150 - 400 K/uL   nRBC 0.2 0.0 - 0.2 %   Neutrophils Relative % 77 %   Neutro Abs 12.3 (H) 1.7 - 7.7 K/uL   Lymphocytes Relative 14 %   Lymphs Abs 2.3 0.7 - 4.0 K/uL   Monocytes Relative 7 %   Monocytes Absolute 1.2 (H) 0.1 - 1.0 K/uL   Eosinophils Relative 1 %   Eosinophils Absolute 0.1 0.0 - 0.5 K/uL   Basophils Relative 0 %   Basophils Absolute 0.0 0.0 - 0.1 K/uL   Immature Granulocytes  1 %   Abs Immature Granulocytes 0.13 (H) 0.00 - 0.07 K/uL  Glucose, capillary     Status: Abnormal   Collection Time: 11/14/22 11:18 AM  Result Value Ref Range   Glucose-Capillary 134 (H) 70 - 99 mg/dL    I have reviewed pertinent nursing notes, vitals, labs, and images as necessary. I have ordered labwork to follow up on as indicated.  I have reviewed the last notes from staff over past 24 hours. I have discussed patient's care plan and test results with nursing staff, CM/SW, and other staff as appropriate.  Time spent: Greater than 50% of the 55 minute visit was spent in counseling/coordination of care for the patient as laid out in the A&P.   LOS: 13 days   Dwyane Dee, MD [facility] 11/14/2022, 12:29 PM

## 2022-11-14 NOTE — Progress Notes (Signed)
[facility] Progress Note  7 Days Post-Op  Subjective: Somenolent. Arouses after repeated loud talking and answers questions appropriately   Objective: Vital signs in last 24 hours: Temp:  [97.9 F (36.6 C)-99.1 F (37.3 C)] 98.8 F (37.1 C) (01/22 0749) Pulse Rate:  [94-97] 95 (01/22 0749) Resp:  [20-33] 24 (01/22 0526) BP: (130-162)/(56-79) 145/65 (01/22 0749) SpO2:  [95 %-100 %] 100 % (01/22 0749) Weight:  [223.3 kg] 223.3 kg (01/22 0600) Last BM Date : 11/12/22  Intake/Output from previous day: 01/21 0701 - 01/22 0700 In: 1750 [NG/GT:1750] Out: 540 [Stool:540] Intake/Output this shift: Total I/O In: 300 [IV Piggyback:300] Out: -   PE: Gen:  Awake and alert, nad GU: indwelling foley and rectal tube in place  Wound Dressings removed and there was no blue-green drainage today - all serous Pictures as noted below.  Perineal and right labial wound communicating with buttock wound with mixture of granulation tissue and fibrinous tissue/exudate at the base of the wound. Wound does track several cm towards right groin - no  loculations; otherwise no significant tracking.  Periwound clean but with significant skin breakdown in skin folds and between thighs that is painful.    Lab Results:  Recent Labs    11/12/22 0325 11/13/22 0331 11/13/22 0800  WBC 15.6* 12.7*  --   HGB 7.3* 6.9* 8.0*  HCT 24.2* 22.7* 25.9*  PLT 186 174  --    BMET Recent Labs    11/12/22 0325 11/13/22 0331  NA 155* 144  K 4.0 3.8  CL 115* 113*  CO2 28 24  GLUCOSE 150* 143*  BUN 18 15  CREATININE 1.04* 0.58  CALCIUM 7.5* 6.9*   PT/INR No results for input(s): "LABPROT", "INR" in the last 72 hours. CMP     Component Value Date/Time   NA 144 11/13/2022 0331   K 3.8 11/13/2022 0331   CL 113 (H) 11/13/2022 0331   CO2 24 11/13/2022 0331   GLUCOSE 143 (H) 11/13/2022 0331   BUN 15 11/13/2022 0331   CREATININE 0.58 11/13/2022 0331   CALCIUM 6.9 (L) 11/13/2022 0331   PROT  5.7 (L) 11/06/2022 0050   ALBUMIN <1.5 (L) 11/06/2022 0050   AST 19 11/06/2022 0050   ALT 19 11/06/2022 0050   ALKPHOS 83 11/06/2022 0050   BILITOT 1.4 (H) 11/06/2022 0050   GFRNONAA >60 11/13/2022 0331   GFRAA >60 07/03/2016 0004   Anti-infectives: Anti-infectives (From admission, onward)    Start     Dose/Rate Route Frequency Ordered Stop   11/10/22 1600  Ampicillin-Sulbactam (UNASYN) 3 g in sodium chloride 0.9 % 100 mL IVPB        3 g 200 mL/hr over 30 Minutes Intravenous Every 6 hours 11/10/22 1438 11/13/22 2323   11/10/22 1400  Ampicillin-Sulbactam (UNASYN) 3 g in sodium chloride 0.9 % 100 mL IVPB  Status:  Discontinued        3 g 200 mL/hr over 30 Minutes Intravenous Every 6 hours 11/10/22 1113 11/10/22 1438   11/04/2022 1630  Ampicillin-Sulbactam (UNASYN) 3 g in sodium chloride 0.9 % 100 mL IVPB  Status:  Discontinued        3 g 200 mL/hr over 30 Minutes Intravenous Every 6 hours 11/04/2022 1051 11/10/22 1113   11-21-22 1600  piperacillin-tazobactam (ZOSYN) IVPB 3.375 g  Status:  Discontinued        3.375 g 12.5 mL/hr over 240 Minutes Intravenous Every 8 hours 2022-11-21 1459 11/19/2022 1031   11/02/22 1400  linezolid (ZYVOX) IVPB 600 mg  Status:  Discontinued        600 mg 300 mL/hr over 60 Minutes Intravenous Every 12 hours 11/02/22 1222 11/04/22 0830   11/02/22 1000  vancomycin (VANCOREADY) IVPB 1250 mg/250 mL  Status:  Discontinued        1,250 mg 166.7 mL/hr over 90 Minutes Intravenous Every 12 hours 11/17/2022 2135 10/31/2022 2137   11/02/22 1000  vancomycin (VANCOREADY) IVPB 1500 mg/300 mL  Status:  Discontinued        1,500 mg 150 mL/hr over 120 Minutes Intravenous Every 12 hours 10/26/2022 2137 11/02/22 1222   11/02/22 0300  ceFEPIme (MAXIPIME) 2 g in sodium chloride 0.9 % 100 mL IVPB  Status:  Discontinued        2 g 200 mL/hr over 30 Minutes Intravenous Every 8 hours 10/31/2022 2135 11/17/2022 2258   11/02/22 0045  vancomycin (VANCOREADY) IVPB 2000 mg/400 mL        2,000 mg 200  mL/hr over 120 Minutes Intravenous  Once 11/02/22 0030 11/02/22 0305   11/23/2022 2315  piperacillin-tazobactam (ZOSYN) IVPB 3.375 g  Status:  Discontinued        3.375 g 12.5 mL/hr over 240 Minutes Intravenous Every 8 hours 11/08/2022 2306 11/13/2022 1459   11/09/2022 2315  clindamycin (CLEOCIN) IVPB 900 mg  Status:  Discontinued        900 mg 100 mL/hr over 30 Minutes Intravenous Every 8 hours 11/22/2022 2306 11/02/22 1222   10/24/2022 1930  vancomycin (VANCOCIN) IVPB 1000 mg/200 mL premix  Status:  Discontinued       See Hyperspace for full Linked Orders Report.   1,000 mg 200 mL/hr over 60 Minutes Intravenous  Once 11/09/2022 1823 11/02/22 0029   11/11/2022 1830  ceFEPIme (MAXIPIME) 2 g in sodium chloride 0.9 % 100 mL IVPB        2 g 200 mL/hr over 30 Minutes Intravenous  Once 10/27/2022 1819 10/31/2022 2016   10/31/2022 1830  metroNIDAZOLE (FLAGYL) IVPB 500 mg        500 mg 100 mL/hr over 60 Minutes Intravenous  Once 11/02/2022 1819 11/21/2022 2130   11/20/2022 1830  vancomycin (VANCOCIN) IVPB 1000 mg/200 mL premix  Status:  Discontinued        1,000 mg 200 mL/hr over 60 Minutes Intravenous  Once 11/17/2022 1819 11/20/2022 1823   10/27/2022 1830  vancomycin (VANCOCIN) IVPB 1000 mg/200 mL premix  Status:  Discontinued       See Hyperspace for full Linked Orders Report.   1,000 mg 200 mL/hr over 60 Minutes Intravenous  Once 10/24/2022 1823 11/02/22 0029        Assessment/Plan S/p debridement of buttock NSTI  11/09/2022 Dr. Derrell Lolling  S/p excisional debridement buttock and perineal wound 11/03/22 Dr. Dwain Sarna S/p excisional debridement perineal wound 11/05/22 Dr. Derrell Lolling S/P excisional debridement perineal wound and buttock wound 11/27/2022 Dr. Magnus Ivan - No further plans to return to the OR at this time. Cont abx. Cx's with GBS and strep mitis/oralis - currently on unasyn. Afebrile overnight. WBC down to 12.7 yesterday - Daily dressing changes with normal saline, will hold off on re-ordering dakins for now, unless green  drainage recurs. Surgery will plan to evaluate wounds M/W/F, RN to change dressing independently T/Th/Sat/Sun  - start destin (triple paste) to periwound and interdry in left inguinal fold and under right buttock to help wick moisture and prevent further skin breakdown. - Ideally this patient would get a diverting colostomy to keep stool out  of her wound but I think her body habitus precludes this.  - We will see as needed tomorrow and plan to see again Monday. Please call back earlier with any questions or concerns.   FEN - carb mod VTE - SQH ID - Clindamycin, Zosyn, Vanc; narrowed to unasyn 1/15>>   - below per CCM -  Septic shock - resolved ARF - improved DM  Morbid obesity - BMI 72 Hypothryoidism PCOS HLD Anxiety/Depression      LOS: 13 days   I reviewed nursing notes, hospitalist notes, last 24 h vitals and pain scores, last 48 h intake and output, and last 24 h labs and trends.   Jill Alexanders, [facility] 11/14/2022, 10:32 AM Please see Amion for pager number during day hours 7:00am-4:30pm

## 2022-11-14 NOTE — Progress Notes (Signed)
Inpatient Rehab Admissions Coordinator:   Per therapy recommendation, patient was screened for CIR candidacy by Clemens Catholic, MS, CCC-SLP. At this time, Pt. Is not demonstrating ability to tolerate the intensity of CIR.   Pt. may have potential to progress to becoming a potential CIR candidate, so CIR admissions team will follow and monitor for progress and participation with therapies and place consult order if Pt. appears to be an appropriate candidate. Please contact me with any questions.   Clemens Catholic, [place], [place] Admissions Coordinator  [phone] ([place]) [phone] (office)

## 2022-11-14 NOTE — Inpatient Diabetes Management (Addendum)
Inpatient Diabetes Program Recommendations  AACE/ADA: New Consensus Statement on Inpatient Glycemic Control   Target Ranges:  Prepandial:   less than 140 mg/dL      Peak postprandial:   less than 180 mg/dL (1-2 hours)      Critically ill patients:  140 - 180 mg/dL    Latest Reference Range & Units 11/13/22 07:31 11/13/22 12:34 11/13/22 15:49 11/13/22 20:10 11/13/22 23:54 11/14/22 04:32 11/14/22 07:50  Glucose-Capillary 70 - 99 mg/dL 170 (H) 212 (H) 175 (H) 162 (H) 131 (H) 115 (H) 123 (H)    Latest Reference Range & Units 10/26/2022 22:42  Hemoglobin A1C 4.8 - 5.6 % 14.7 (H)   Review of Glycemic Control  Diabetes history: DM2 Outpatient Diabetes medications: Actos 45 mg daily, Amaryl 2 mg daily Current orders for Inpatient glycemic control: Semglee 55 units Q12H, Novolog 15 units TID with meals, Novolog 0-20 units Q4H  Inpatient Diabetes Program Recommendations:     HbgA1C:  A1C 14.7% on 11/05/2022 indicating an average glucose of 375 mg/dl over the past 2-3 months. Noted in Care Everywhere A1C was 7.4% on 01/31/22.  NOTE: Ordered Living Well with DM book, insulin starter kit, and patient education by bedside nursing. Will plan to see patient today.  Addendum 11/14/22@13 :00-Went by to speak with patient about DM and insulin. Patient to lethargic for teaching. Opened eyes initially to name but closed them back and did not open any more. Inpatient diabetes team will follow up with patient when appropriate.   Thanks, Barnie Alderman, RN, MSN, [place] Diabetes Coordinator Inpatient Diabetes Program [phone] (Team Pager from 8am to [place])

## 2022-11-15 DIAGNOSIS — E1169 Type 2 diabetes mellitus with other specified complication: Secondary | ICD-10-CM

## 2022-11-15 DIAGNOSIS — N179 Acute kidney failure, unspecified: Secondary | ICD-10-CM

## 2022-11-15 LAB — CBC WITH DIFFERENTIAL/PLATELET
Abs Immature Granulocytes: 0.1 10*3/uL — ABNORMAL HIGH (ref 0.00–0.07)
Basophils Absolute: 0 10*3/uL (ref 0.0–0.1)
Basophils Relative: 0 %
Eosinophils Absolute: 0.1 10*3/uL (ref 0.0–0.5)
Eosinophils Relative: 1 %
HCT: 25.4 % — ABNORMAL LOW (ref 36.0–46.0)
Hemoglobin: 7.5 g/dL — ABNORMAL LOW (ref 12.0–15.0)
Immature Granulocytes: 1 %
Lymphocytes Relative: 17 %
Lymphs Abs: 2.1 10*3/uL (ref 0.7–4.0)
MCH: 28.6 pg (ref 26.0–34.0)
MCHC: 29.5 g/dL — ABNORMAL LOW (ref 30.0–36.0)
MCV: 96.9 fL (ref 80.0–100.0)
Monocytes Absolute: 0.9 10*3/uL (ref 0.1–1.0)
Monocytes Relative: 7 %
Neutro Abs: 9.3 10*3/uL — ABNORMAL HIGH (ref 1.7–7.7)
Neutrophils Relative %: 74 %
Platelets: 222 10*3/uL (ref 150–400)
RBC: 2.62 MIL/uL — ABNORMAL LOW (ref 3.87–5.11)
RDW: 17.5 % — ABNORMAL HIGH (ref 11.5–15.5)
WBC: 12.5 10*3/uL — ABNORMAL HIGH (ref 4.0–10.5)
nRBC: 0.2 % (ref 0.0–0.2)

## 2022-11-15 LAB — GLUCOSE, CAPILLARY
Glucose-Capillary: 87 mg/dL (ref 70–99)
Glucose-Capillary: 66 mg/dL — ABNORMAL LOW (ref 70–99)
Glucose-Capillary: 107 mg/dL — ABNORMAL HIGH (ref 70–99)
Glucose-Capillary: 93 mg/dL (ref 70–99)
Glucose-Capillary: 175 mg/dL — ABNORMAL HIGH (ref 70–99)
Glucose-Capillary: 148 mg/dL — ABNORMAL HIGH (ref 70–99)

## 2022-11-15 LAB — MAGNESIUM: Magnesium: 2.3 mg/dL (ref 1.7–2.4)

## 2022-11-15 LAB — BASIC METABOLIC PANEL
Anion gap: 9 (ref 5–15)
BUN: 26 mg/dL — ABNORMAL HIGH (ref 6–20)
CO2: 26 mmol/L (ref 22–32)
Calcium: 8.3 mg/dL — ABNORMAL LOW (ref 8.9–10.3)
Chloride: 110 mmol/L (ref 98–111)
Creatinine, Ser: 1.44 mg/dL — ABNORMAL HIGH (ref 0.44–1.00)
GFR, Estimated: 48 mL/min — ABNORMAL LOW (ref >60)
Glucose, Bld: 90 mg/dL (ref 70–99)
Potassium: 3.8 mmol/L (ref 3.5–5.1)
Sodium: 145 mmol/L (ref 135–145)

## 2022-11-15 MED ORDER — ORAL CARE MOUTH RINSE: 15.0000 mL | OROMUCOSAL | Status: DC | PRN

## 2022-11-15 MED ORDER — INSULIN ASPART 100 UNIT/ML IJ SOLN
0.0000 [IU] | Freq: Three times a day (TID) | INTRAMUSCULAR | Status: DC
  Administered 2022-11-16 (×2): 3 [IU] via SUBCUTANEOUS
  Administered 2022-11-16: 2 [IU] via SUBCUTANEOUS
  Administered 2022-11-16: 3 [IU] via SUBCUTANEOUS
  Administered 2022-11-17: 2 [IU] via SUBCUTANEOUS
  Administered 2022-11-17: 5 [IU] via SUBCUTANEOUS
  Administered 2022-11-17 – 2022-11-18 (×3): 3 [IU] via SUBCUTANEOUS
  Administered 2022-11-18 (×2): 5 [IU] via SUBCUTANEOUS
  Administered 2022-11-19: 2 [IU] via SUBCUTANEOUS
  Administered 2022-11-19 (×2): 3 [IU] via SUBCUTANEOUS
  Administered 2022-11-19: 2 [IU] via SUBCUTANEOUS
  Administered 2022-11-20 (×3): 5 [IU] via SUBCUTANEOUS
  Administered 2022-11-20: 3 [IU] via SUBCUTANEOUS
  Administered 2022-11-21: 5 [IU] via SUBCUTANEOUS
  Administered 2022-11-21 (×2): 3 [IU] via SUBCUTANEOUS
  Administered 2022-11-21: 5 [IU] via SUBCUTANEOUS
  Administered 2022-11-22: 3 [IU] via SUBCUTANEOUS
  Administered 2022-11-22: 2 [IU] via SUBCUTANEOUS
  Administered 2022-11-22: 5 [IU] via SUBCUTANEOUS
  Administered 2022-11-22: 3 [IU] via SUBCUTANEOUS
  Administered 2022-11-23: 2 [IU] via SUBCUTANEOUS
  Administered 2022-11-23 (×2): 3 [IU] via SUBCUTANEOUS
  Administered 2022-11-24 (×3): 2 [IU] via SUBCUTANEOUS
  Administered 2022-11-25: 3 [IU] via SUBCUTANEOUS
  Administered 2022-11-25: 2 [IU] via SUBCUTANEOUS

## 2022-11-15 MED ORDER — INSULIN GLARGINE-YFGN 100 UNIT/ML ~~LOC~~ SOLN
50.0000 [IU] | Freq: Every day | SUBCUTANEOUS | Status: DC
  Filled 2022-11-15 (×2): qty 0.5

## 2022-11-15 MED ORDER — INSULIN ASPART 100 UNIT/ML IJ SOLN: 10.0000 [IU] | Freq: Three times a day (TID) | INTRAMUSCULAR | Status: DC

## 2022-11-15 NOTE — Progress Notes (Signed)
Physical Therapy Treatment Patient Details Name: Lindsey [name] MRN: [id number] DOB: 1986/06/02 Today's Date: 11/15/2022   History of Present Illness 37 year old morbidly obese female who presented 11/01/22 with generalized weakness, shortness of breath and worsening buttock wounds for about a month. +afib with RVR; sepsis; to OR 1/10, 1/11, 2022-11-30, 1/15 for necrotizing fascitis; post-op remained on vent, sedated with propofol, levophed; extubated 1/19;  PMH-diabetes , morbid obesity, pseudotumor cerebri, vision disturbance, idiopathic intracranial hypertension, HTN, panic disorder with agoraphobia.    PT Comments    Pt received in supine, agreeable to therapy session and with good participation and fair tolerance for bed mobility and attempts at long sitting in bed chair posture. Pt limited due to wound related pain in bottom/peri areas but able to assist with cross-body reaching for rolling with pad change and also with posterior supine scooting toward HOB from trendelenburg bed posture. Pt needing +2 modA for attempts at long sitting in bed chair posture, but self-limiting upright sitting due to bottom and abdominal pain. RN notified pt needs to have her bed pads changed as they are damp however she will need premedication for this and was only able to tolerate removal of more soiled paper pad prior to fatigue after rolling to L/R sides. Elevated RR with rolling however HR/SpO2 WFL on 2L O2 [place]. Plan to work on more upright/seated balance tasks next session if pain allows. Pt continues to benefit from PT services to progress toward functional mobility goals.   Recommendations for follow up therapy are one component of a multi-disciplinary discharge planning process, led by the attending physician.  Recommendations may be updated based on patient status, additional functional criteria and insurance authorization.  Follow Up Recommendations  Acute inpatient rehab (3hours/day)     Assistance Recommended  at Discharge PRN  Patient can return home with the following  (totalA currently)   Equipment Recommendations  Other (comment) (TBD post-acute)    Recommendations for Other Services       Precautions / Restrictions Precautions Precautions: Fall Precaution Comments: Rectal Tube, watch O2 and HR, pain limited Restrictions Weight Bearing Restrictions: No     Mobility  Bed Mobility Overal bed mobility: Needs Assistance Bed Mobility: Rolling Rolling: Max assist, +2 for physical assistance (+3 A at times)   Supine to sit: Mod assist, +2 for physical assistance, HOB elevated     General bed mobility comments: Bed elevated into semi-chair position and pt then able to use lower rails to pull her torso forward into upright sitting with +2 mod assist but did not remain upright >30 seconds. Rolling to L/R sides for removal of soiled linens, RN present to assist with hygiene at this time.    Transfers                   General transfer comment: TBA; chair position did not allow egress and pain too high in seated posture to attempt    Ambulation/Gait                   Stairs             Wheelchair Mobility    Modified Rankin (Stroke Patients Only)       Balance Overall balance assessment: Needs assistance Sitting-balance support: Bilateral upper extremity supported, Feet unsupported Sitting balance-Leahy Scale:  (Poor to Zero) Sitting balance - Comments: pt using bilat side rails for attempt at long sitting x2 trials, limited tolerance due to bottom wound pain  Cognition Arousal/Alertness: Awake/alert Behavior During Therapy: Anxious Overall Cognitive Status: Impaired/Different from baseline Area of Impairment: Attention, Memory, Following commands, Safety/judgement, Awareness, Problem solving                   Current Attention Level: Sustained Memory: Decreased short-term memory Following  Commands: Follows multi-step commands inconsistently, Follows one step commands consistently Safety/Judgement: Decreased awareness of deficits Awareness: Emergent Problem Solving: Slow processing, Requires verbal cues, Decreased initiation, Difficulty sequencing General Comments: Pt following simple 1-step commands well, anxious and apprehensive to attempt tasks but agreeable with increased time/encouragement. Pt internally distracted due to pain and needs encouragement in order to motivate her to participate in some activities. Pt stated she had not ordered lunch when PTA asked her, pt with poor motivation to do this for herself, PTA dialed # and pt able to place order once staff answered the phone.        Exercises Other Exercises Other Exercises: supine BLE A/AAROM: ankle pumps, quad sets, hip IR x 3-5 reps ea Other Exercises: chair posture bed "crunches" for UE strengthening x2 reps    General Comments General comments (skin integrity, edema, etc.): SpO2 99-100% on O2 [place] HR ~80's-90's bpm with bed mobility tasks; bed pads wet, per RN pt needs premedication, he plans to assist her to roll and remove these soon. Pt instructed to notify staff when bed pads feel wet or soiled to prevent skin breakdown. RN provided some peri care during rolling, pt totalA for hygiene.      Pertinent Vitals/Pain Pain Assessment Pain Assessment: Faces Faces Pain Scale: Hurts even more Pain Location: "stomach" (deep abdomen), wounds on bottom Pain Descriptors / Indicators: Discomfort, Grimacing, Guarding, Sore, Tender, Moaning Pain Intervention(s): Limited activity within patient's tolerance, Monitored during session, Premedicated before session, Repositioned, Patient requesting pain meds-RN notified     PT Goals (current goals can now be found in the care plan section) Acute Rehab PT Goals Patient Stated Goal: decreased pain, to sit up in chair PT Goal Formulation: With patient Time For Goal Achievement:  11/26/22 Progress towards PT goals: Progressing toward goals    Frequency    Min 3X/week      PT Plan Current plan remains appropriate       AM-PAC PT "6 Clicks" Mobility   Outcome Measure  Help needed turning from your back to your side while in a flat bed without using bedrails?: Total Help needed moving from lying on your back to sitting on the side of a flat bed without using bedrails?: Total Help needed moving to and from a bed to a chair (including a wheelchair)?: Total Help needed standing up from a chair using your arms (e.g., wheelchair or bedside chair)?: Total Help needed to walk in hospital room?: Total Help needed climbing 3-5 steps with a railing? : Total 6 Click Score: 6    End of Session Equipment Utilized During Treatment: Oxygen Activity Tolerance: Patient limited by pain;Treatment limited secondary to medical complications (Comment);Other (comment) (per RN, waiting for nursing staff to be available prior to giving more pain meds for wound care and more extensive hygiene tasks) Patient left: in bed;with call bell/phone within reach;with bed alarm set;Other (comment) (heels floated, bed partial chair posture HOB ~50* as pt planning to drink/eat. Pt shown how to use bed controls to lower head later.) Nurse Communication: Mobility status;Need for lift equipment;Patient requests pain meds;Precautions;Other (comment) (needs bed pads changed -wet) PT Visit Diagnosis: Muscle weakness (generalized) (M62.81);Difficulty in walking, not elsewhere classified (  R26.2)     Time: 7425-9563 PT Time Calculation (min) (ACUTE ONLY): 38 min  Charges:  $Therapeutic Exercise: 8-22 mins $Therapeutic Activity: 23-37 mins                     Alyxandra Tenbrink P., PTA Acute Rehabilitation Services Secure Chat Preferred 9a-5:30pm Office: [phone]    Dorathy Kinsman [facility] 11/15/2022, 5:23 PM

## 2022-11-15 NOTE — Progress Notes (Addendum)
Notified by NT that patient had BG of 66 at routine vital checks.  4 oz of cranberry juice and 4 oz of sprite administered.  Will check back shortly.  Hypoglycemic Event  CBG: 66  Treatment: 4 oz juice/soda  Symptoms: None  Follow-up CBG: BJSE:8315 CBG Result:107  Possible Reasons for Event: Unknown  Comments/MD notified:Gir[place]is    Jacklynn Barnacle

## 2022-11-15 NOTE — Progress Notes (Signed)
Wound dressing changes done at this time; 4-5 staff nurses helping out to complete the task.

## 2022-11-15 NOTE — Inpatient Diabetes Management (Signed)
Inpatient Diabetes Program Recommendations  AACE/ADA: New Consensus Statement on Inpatient Glycemic Control   Target Ranges:  Prepandial:   less than 140 mg/dL      Peak postprandial:   less than 180 mg/dL (1-2 hours)      Critically ill patients:  140 - 180 mg/dL    Latest Reference Range & Units 11/15/22 04:15  Glucose-Capillary 70 - 99 mg/dL 87    Latest Reference Range & Units 11/14/22 07:50 11/14/22 11:18 11/14/22 16:27 11/14/22 20:03 11/14/22 23:43  Glucose-Capillary 70 - 99 mg/dL 123 (H) 134 (H) 88 126 (H) 92   Review of Glycemic Control Diabetes history: DM2 Outpatient Diabetes medications: Actos 45 mg daily, Amaryl 2 mg daily Current orders for Inpatient glycemic control: Semglee 55 units Q12H, Novolog 15 units TID with meals, Novolog 0-20 units Q4H   Inpatient Diabetes Program Recommendations:     Insulin: Only received Semglee 55 units once on 11/14/22 and glucose 87 mg/dl at 4:15 am today.  Please decrease Semglee to 50 units daily, decrease meal coverage to Novolog 12 units TID with meals, and change Novolog to 0-15 units AC&HS.   HbgA1C:  A1C 14.7% on 11/05/2022 indicating an average glucose of 375 mg/dl over the past 2-3 months. Noted in Care Everywhere A1C was 7.4% on 01/31/22.   Thanks, Barnie Alderman, RN, MSN, [place] Diabetes Coordinator Inpatient Diabetes Program [phone] (Team Pager from 8am to [place])

## 2022-11-15 NOTE — Progress Notes (Signed)
Progress Note    Lindsey [name]   PNT:[id number]  DOB: 03-15-1986  DOA: 11/08/2022     14 PCP: [name], MD  Initial CC: buttock wound  Hospital Course: Lindsey [name] is a 37 yo female with PMH DMII, morbid obesity, probable OHS, depression/anxiety, HLD, HTN, hypothyroidism, PCOS, pseudotumor cerebri, PTSD who presented with worsening buttocks wounds.  Initially started around Thanksgiving and essentially had slowly progressively worsened prompting presentation to the ER. CT abdomen/pelvis performed and was consistent with Fournier's gangrene involving the perineum on the right and medial/posteromedial aspects of the right gluteal region. Significant Hospital Events:  CT abdomen pelvis 1/9 > basilar atelectasis, normal stomach, appendix, bowel wall, no hernia, mild to moderate subcutaneous impaired muscular inflammatory fat stranding anterolateral aspect abdominal wall on the left, no ascites.  Soft tissue gas along the perineum on the right with medial and posterior medial right gluteal involvement consistent with Fournier's gangrene OR for debridement 1/9, 12/08/2022, 1/13 Blood cultures 1/9 > negative x  days Urine culture 1/9 > yeast 70 K Right buttock wound culture 1/10 > GPC, GNR > abundant group B strep, abundant strep mitis, Prevotella PICC placed 1/12 Extubated 1/19 to BiPAP  Interval History:  No events overnight.  Patient was very drowsy yesterday and today she is much more awake and alert.  Suspect she was oversedated from pain medication which has since been reduced. This morning she is hypoglycemic and insulins are being held and adjusted.   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   Wound culture with group B strep, nontoxin producing, strep mitis, Prevotella - s/p debridement on 1/9, 25-Nov-2022, 1/13, 1/15 -Completed course of Unasyn - Dressing changes and wound care per surgery.  Appreciate their management. - Dilaudid as needed for  dressing changes - decreased oxy dosing (may need to make PRN again) - Zinc for wound healing -Eating adequately, no longer needing tube feeds - caution with oversedation   Type 2 diabetes-uncontrolled with A1C 14.7 -Home glimepiride and pioglitazone are on hold - continue semglee - continue SSI and CBG monitoring  -Hypoglycemia on 11/15/2022, possibly from not eating well the day prior; insulins being at [facility] and may need further adjustment as we continue to trend glucose levels  AKI - creat increased to 1.69 on 1/22 -Foley was occluded with probable sediment; after flushing on 11/14/2022, Foley began working again.  Continue monitoring for any further decreased output  Physical Deconditioning - At baseline, patient states that she was ambulating at home prior to hospitalization -Continue PT/OT - Suspect not going to be a CIR candidate (but they are following) and body habitus precludes SNF; need to consider LTAC for ongoing complex wound management and ongoing PT/OT  Acute respiratory failure due to acute encephalopathy from infection 2/2 for near gangrene, also possible contribution of evolving bilateral pulmonary infiltrates with total body volume overload, pulmonary edema At risk for OSA and OHS - Wean oxygen to maintain SpO2 greater than 90% - Nightly BiPAP   Hypertension -Amlodipine and hydralazine   Blood loss anemia from 4 debridement surgeries - Repeat hemoglobin noted.  Will transfuse if hemoglobin persistently below 7 -Continue trending.    Hypernatremia - resolved  - continue diet    Hx Hypothyroidism -Continue PTA levothyroxine   Hx Anxiety, Depression, Panic disorder, PTSD. -Continue PTA Seroquel   Idiopathic intracranial hypertension -continue PTA diamox TID    Old records reviewed in assessment of this patient  Antimicrobials: Unasyn completed 11/13/2022  DVT  prophylaxis:  heparin injection 7,500 Units Start: 11/11/22 1400 SCDs Start: 11/15/2022  2241   Code Status:   Code Status: Full Code  Mobility Assessment (last 72 hours)     Mobility Assessment     Row Name 11/14/22 2225 11/14/22 1348 11/12/22 1327       Does patient have an order for bedrest or is patient medically unstable Yes- Bedfast (Level 1) - Complete -- --     What is the highest level of mobility based on the progressive mobility assessment? Level 1 (Bedfast) - Unable to balance while sitting on edge of bed Level 1 (Bedfast) - Unable to balance while sitting on edge of bed Level 1 (Bedfast) - Unable to balance while sitting on edge of bed              Barriers to discharge:  Disposition Plan:  TBD (CIR vs LTAC are probable best options) Status is: Inpt  Objective: Blood pressure (!) 148/77, pulse 90, temperature 99.7 F (37.6 C), temperature source Oral, resp. rate (!) 22, height 5\' 6"  (1.676 m), weight (!) 223.3 kg, SpO2 100 %.  Examination:  Physical Exam Constitutional:      General: She is not in acute distress.    Appearance: She is obese.     Comments: Awake and alert, with normal mentation.  No distress  HENT:     Head: Normocephalic and atraumatic.     Mouth/Throat:     Mouth: Mucous membranes are moist.  Eyes:     Extraocular Movements: Extraocular movements intact.  Cardiovascular:     Rate and Rhythm: Normal rate and regular rhythm.  Pulmonary:     Effort: Pulmonary effort is normal.     Breath sounds: Normal breath sounds.  Abdominal:     General: Bowel sounds are normal. There is no distension.     Palpations: Abdomen is soft.     Tenderness: There is no abdominal tenderness.  Musculoskeletal:        General: Swelling present.     Cervical back: Normal range of motion and neck supple.     Comments: Generalized edema  Neurological:     Comments: Follows commands and moves all 4 extremities      Consultants:  General surgery  Procedures:    Data Reviewed: Results for orders placed or performed during the hospital  encounter of 10/24/2022 (from the past 24 hour(s))  Glucose, capillary     Status: None   Collection Time: 11/14/22  4:27 PM  Result Value Ref Range   Glucose-Capillary 88 70 - 99 mg/dL  Glucose, capillary     Status: Abnormal   Collection Time: 11/14/22  8:03 PM  Result Value Ref Range   Glucose-Capillary 126 (H) 70 - 99 mg/dL  Glucose, capillary     Status: None   Collection Time: 11/14/22 11:43 PM  Result Value Ref Range   Glucose-Capillary 92 70 - 99 mg/dL  Glucose, capillary     Status: None   Collection Time: 11/15/22  4:15 AM  Result Value Ref Range   Glucose-Capillary 87 70 - 99 mg/dL  Basic metabolic panel     Status: Abnormal   Collection Time: 11/15/22  4:17 AM  Result Value Ref Range   Sodium 145 135 - 145 mmol/L   Potassium 3.8 3.5 - 5.1 mmol/L   Chloride 110 98 - 111 mmol/L   CO2 26 22 - 32 mmol/L   Glucose, Bld 90 70 - 99 mg/dL  BUN 26 (H) 6 - 20 mg/dL   Creatinine, Ser 1.44 (H) 0.44 - 1.00 mg/dL   Calcium 8.3 (L) 8.9 - 10.3 mg/dL   GFR, Estimated 48 (L) >60 mL/min   Anion gap 9 5 - 15  CBC with Differential/Platelet     Status: Abnormal   Collection Time: 11/15/22  4:17 AM  Result Value Ref Range   WBC 12.5 (H) 4.0 - 10.5 K/uL   RBC 2.62 (L) 3.87 - 5.11 MIL/uL   Hemoglobin 7.5 (L) 12.0 - 15.0 g/dL   HCT 25.4 (L) 36.0 - 46.0 %   MCV 96.9 80.0 - 100.0 fL   MCH 28.6 26.0 - 34.0 pg   MCHC 29.5 (L) 30.0 - 36.0 g/dL   RDW 17.5 (H) 11.5 - 15.5 %   Platelets 222 150 - 400 K/uL   nRBC 0.2 0.0 - 0.2 %   Neutrophils Relative % 74 %   Neutro Abs 9.3 (H) 1.7 - 7.7 K/uL   Lymphocytes Relative 17 %   Lymphs Abs 2.1 0.7 - 4.0 K/uL   Monocytes Relative 7 %   Monocytes Absolute 0.9 0.1 - 1.0 K/uL   Eosinophils Relative 1 %   Eosinophils Absolute 0.1 0.0 - 0.5 K/uL   Basophils Relative 0 %   Basophils Absolute 0.0 0.0 - 0.1 K/uL   Immature Granulocytes 1 %   Abs Immature Granulocytes 0.10 (H) 0.00 - 0.07 K/uL  Magnesium     Status: None   Collection Time: 11/15/22   4:17 AM  Result Value Ref Range   Magnesium 2.3 1.7 - 2.4 mg/dL  Glucose, capillary     Status: Abnormal   Collection Time: 11/15/22  7:56 AM  Result Value Ref Range   Glucose-Capillary 66 (L) 70 - 99 mg/dL  Glucose, capillary     Status: Abnormal   Collection Time: 11/15/22  8:43 AM  Result Value Ref Range   Glucose-Capillary 107 (H) 70 - 99 mg/dL  Glucose, capillary     Status: None   Collection Time: 11/15/22 11:28 AM  Result Value Ref Range   Glucose-Capillary 93 70 - 99 mg/dL    I have reviewed pertinent nursing notes, vitals, labs, and images as necessary. I have ordered labwork to follow up on as indicated.  I have reviewed the last notes from staff over past 24 hours. I have discussed patient's care plan and test results with nursing staff, CM/SW, and other staff as appropriate.  Time spent: Greater than 50% of the 55 minute visit was spent in counseling/coordination of care for the patient as laid out in the A&P.   LOS: 14 days   Dwyane Dee, MD [facility] 11/15/2022, 11:49 AM

## 2022-11-15 NOTE — Progress Notes (Signed)
Pt refused to wear CPAP tonight. ?

## 2022-11-16 ENCOUNTER — Encounter (HOSPITAL_COMMUNITY): Payer: Self-pay | Admitting: General Surgery

## 2022-11-16 DIAGNOSIS — E039 Hypothyroidism, unspecified: Secondary | ICD-10-CM

## 2022-11-16 DIAGNOSIS — I10 Essential (primary) hypertension: Secondary | ICD-10-CM

## 2022-11-16 LAB — CBC WITH DIFFERENTIAL/PLATELET
Abs Immature Granulocytes: 0.04 10*3/uL (ref 0.00–0.07)
Basophils Absolute: 0 10*3/uL (ref 0.0–0.1)
Basophils Relative: 0 %
Eosinophils Absolute: 0.1 10*3/uL (ref 0.0–0.5)
Eosinophils Relative: 1 %
HCT: 24.7 % — ABNORMAL LOW (ref 36.0–46.0)
Hemoglobin: 7.1 g/dL — ABNORMAL LOW (ref 12.0–15.0)
Immature Granulocytes: 0 %
Lymphocytes Relative: 16 %
Lymphs Abs: 1.7 10*3/uL (ref 0.7–4.0)
MCH: 29.3 pg (ref 26.0–34.0)
MCHC: 28.7 g/dL — ABNORMAL LOW (ref 30.0–36.0)
MCV: 102.1 fL — ABNORMAL HIGH (ref 80.0–100.0)
Monocytes Absolute: 0.9 10*3/uL (ref 0.1–1.0)
Monocytes Relative: 8 %
Neutro Abs: 7.8 10*3/uL — ABNORMAL HIGH (ref 1.7–7.7)
Neutrophils Relative %: 75 %
Platelets: 209 10*3/uL (ref 150–400)
RBC: 2.42 MIL/uL — ABNORMAL LOW (ref 3.87–5.11)
RDW: 17.2 % — ABNORMAL HIGH (ref 11.5–15.5)
WBC: 10.5 10*3/uL (ref 4.0–10.5)
nRBC: 0 % (ref 0.0–0.2)

## 2022-11-16 LAB — GLUCOSE, CAPILLARY
Glucose-Capillary: 146 mg/dL — ABNORMAL HIGH (ref 70–99)
Glucose-Capillary: 150 mg/dL — ABNORMAL HIGH (ref 70–99)
Glucose-Capillary: 158 mg/dL — ABNORMAL HIGH (ref 70–99)
Glucose-Capillary: 159 mg/dL — ABNORMAL HIGH (ref 70–99)
Glucose-Capillary: 166 mg/dL — ABNORMAL HIGH (ref 70–99)
Glucose-Capillary: 182 mg/dL — ABNORMAL HIGH (ref 70–99)

## 2022-11-16 LAB — BASIC METABOLIC PANEL
Anion gap: 9 (ref 5–15)
BUN: 15 mg/dL (ref 6–20)
CO2: 23 mmol/L (ref 22–32)
Calcium: 8.4 mg/dL — ABNORMAL LOW (ref 8.9–10.3)
Chloride: 108 mmol/L (ref 98–111)
Creatinine, Ser: 1.04 mg/dL — ABNORMAL HIGH (ref 0.44–1.00)
GFR, Estimated: >60 mL/min (ref >60)
Glucose, Bld: 176 mg/dL — ABNORMAL HIGH (ref 70–99)
Potassium: 3.6 mmol/L (ref 3.5–5.1)
Sodium: 140 mmol/L (ref 135–145)

## 2022-11-16 LAB — MAGNESIUM: Magnesium: 1.8 mg/dL (ref 1.7–2.4)

## 2022-11-16 MED ORDER — INSULIN GLARGINE-YFGN 100 UNIT/ML ~~LOC~~ SOLN
20.0000 [IU] | Freq: Every day | SUBCUTANEOUS | Status: DC
  Administered 2022-11-17 – 2022-11-20 (×4): 20 [IU] via SUBCUTANEOUS
  Filled 2022-11-16 (×4): qty 0.2

## 2022-11-16 MED ORDER — OXYCODONE HCL 5 MG PO TABS
5.0000 mg | ORAL_TABLET | Freq: Every day | ORAL | Status: DC | PRN
  Administered 2022-11-16: 5 mg via ORAL
  Filled 2022-11-16: qty 1

## 2022-11-16 MED ORDER — OXYCODONE HCL 5 MG PO TABS: 10.0000 mg | ORAL_TABLET | Freq: Four times a day (QID) | ORAL | Status: DC

## 2022-11-16 MED ORDER — OXYCODONE HCL 5 MG PO TABS
5.0000 mg | ORAL_TABLET | ORAL | Status: DC | PRN
  Administered 2022-11-16 – 2022-11-24 (×4): 10 mg via ORAL
  Filled 2022-11-16 (×5): qty 2

## 2022-11-16 MED ORDER — OXYCODONE HCL 5 MG PO TABS
20.0000 mg | ORAL_TABLET | Freq: Every day | ORAL | Status: DC | PRN
  Administered 2022-11-17 – 2022-11-23 (×6): 20 mg via ORAL
  Filled 2022-11-16 (×7): qty 4

## 2022-11-16 MED ORDER — HYDROMORPHONE HCL 1 MG/ML IJ SOLN: 2.0000 mg | Freq: Every day | INTRAMUSCULAR | Status: DC | PRN

## 2022-11-16 NOTE — Progress Notes (Signed)
[facility]                                                                               Lindsey [name], is a 37 y.o. female, DOB - July 01, 1986, [id number] Admit date - 11/06/2022    Outpatient Primary MD for the patient is [name], MD  LOS - 15  days    Brief summary   37 year old lady with prior history of type 2 diabetes mellitus, morbid obesity, OHS, hypertension for thyroidism, PCOS, pseudotumor cerebri, depression and anxiety, PTSD presents with worsening right perineal and gluteal region wounds. CT of the abdomen and pelvis consistent with Fournier's gangrene involving the perineum on the right, medial and posteromedial aspects of the right gluteal region.  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  And completed the course of Unasyn.  General surgery and wound care on board, appreciate their management. Daily dressing changes with normal saline. Pain control and nutrition. Improving leukocytosis. WBC count normalized   Type 2 diabetes mellitus Uncontrolled with hypoglycemia and hyperglycemia Last A1c is 14.7 CBG (last 3)  Recent Labs    11/16/22 0453 11/16/22 0715 11/16/22 1149  GLUCAP 159* 166* 146*   His CBGs have been well under 200s so far continue with sliding scale insulin. Start  Semglee at 20 units daily.      Acute kidney injury Probably from poor oral intake. Creatinine slowly improving and is back to 1.   Acute respiratory failure in the setting of OHS BiPAP at night and nasal cannula oxygen to keep sats greater than 90%      Essential hypertension Blood pressure parameters are optimal Patient currently is on amlodipine-hydralazine     Acute anemia of blood loss from debridement procedures Transfuse to keep  hemoglobin greater than 7    Hypernatremia from free water deficit Appears to have resolved, continue to monitor    Hypothyroidism Continue with levothyroxine    History of anxiety, depression, PTSD, panic disorder Resume home medication     History of idiopathic intracranial hypertension Continue with Diamox 3 times daily     Physical deconditioning Suspect not going to be a CIR candidate (but they are following) and body habitus precludes SNF; need to consider LTAC for ongoing complex wound management and ongoing PT/OT    RN Pressure Injury Documentation:    Malnutrition Type:  Nutrition Problem: Increased nutrient needs Etiology: wound healing   Malnutrition Characteristics:  Signs/Symptoms: estimated needs   Nutrition Interventions:  Interventions: Refer to RD note for recommendations  Estimated body mass index is 79.46 kg/m as calculated from the following:   Height as of this encounter: 5\' 6"  (1.676 m).   Weight as of this encounter: 223.3 kg.  Code Status: Full code DVT Prophylaxis:  heparin injection 7,500 Units Start: 11/11/22 1400 SCDs Start: 10/29/2022 2241   Level of Care: Level of care: Telemetry Surgical Family Communication: None at bedside  Disposition Plan:     Remains inpatient appropriate: CIR versus  LTAC  Procedures:  S/p debridement of buttock NSTI  11/15/2022 Dr. Rosendo Gros  S/p excisional debridement buttock and perineal wound 11/23/2022 Dr. Donne Hazel S/p excisional debridement perineal wound 11/10/2022 Dr. Rosendo Gros S/P excisional debridement perineal wound and buttock wound 11/14/22 Dr. Ninfa Linden  Consultants:   General surgery  Antimicrobials:   Anti-infectives (From admission, onward)    Start     Dose/Rate Route Frequency Ordered Stop   11/10/22 1600  Ampicillin-Sulbactam (UNASYN) 3 g in sodium chloride 0.9 % 100 mL IVPB        3 g 200 mL/hr over 30 Minutes Intravenous Every 6 hours 11/10/22 1438 11/13/22 2323   11/10/22 1400   Ampicillin-Sulbactam (UNASYN) 3 g in sodium chloride 0.9 % 100 mL IVPB  Status:  Discontinued        3 g 200 mL/hr over 30 Minutes Intravenous Every 6 hours 11/10/22 1113 11/10/22 1438   11/14/2022 1630  Ampicillin-Sulbactam (UNASYN) 3 g in sodium chloride 0.9 % 100 mL IVPB  Status:  Discontinued        3 g 200 mL/hr over 30 Minutes Intravenous Every 6 hours Nov 14, 2022 1051 11/10/22 1113   11/04/2022 1600  piperacillin-tazobactam (ZOSYN) IVPB 3.375 g  Status:  Discontinued        3.375 g 12.5 mL/hr over 240 Minutes Intravenous Every 8 hours 11/02/2022 1459 11-14-2022 1031   11/02/22 1400  linezolid (ZYVOX) IVPB 600 mg  Status:  Discontinued        600 mg 300 mL/hr over 60 Minutes Intravenous Every 12 hours 11/02/22 1222 11/04/22 0830   11/02/22 1000  vancomycin (VANCOREADY) IVPB 1250 mg/250 mL  Status:  Discontinued        1,250 mg 166.7 mL/hr over 90 Minutes Intravenous Every 12 hours 10/31/2022 2135 11/11/2022 2137   11/02/22 1000  vancomycin (VANCOREADY) IVPB 1500 mg/300 mL  Status:  Discontinued        1,500 mg 150 mL/hr over 120 Minutes Intravenous Every 12 hours 11/15/2022 2137 11/02/22 1222   11/02/22 0300  ceFEPIme (MAXIPIME) 2 g in sodium chloride 0.9 % 100 mL IVPB  Status:  Discontinued        2 g 200 mL/hr over 30 Minutes Intravenous Every 8 hours 11/06/2022 2135 10/24/2022 2258   11/02/22 0045  vancomycin (VANCOREADY) IVPB 2000 mg/400 mL        2,000 mg 200 mL/hr over 120 Minutes Intravenous  Once 11/02/22 0030 11/02/22 0305   11/02/2022 2315  piperacillin-tazobactam (ZOSYN) IVPB 3.375 g  Status:  Discontinued        3.375 g 12.5 mL/hr over 240 Minutes Intravenous Every 8 hours 11/04/2022 2306 11/21/2022 1459   11/08/2022 2315  clindamycin (CLEOCIN) IVPB 900 mg  Status:  Discontinued        900 mg 100 mL/hr over 30 Minutes Intravenous Every 8 hours 11/17/2022 2306 11/02/22 1222   10/30/2022 1930  vancomycin (VANCOCIN) IVPB 1000 mg/200 mL premix  Status:  Discontinued       See Hyperspace for full Linked  Orders Report.   1,000 mg 200 mL/hr over 60 Minutes Intravenous  Once 10/28/2022 1823 11/02/22 0029   11/08/2022 1830  ceFEPIme (MAXIPIME) 2 g in sodium chloride 0.9 % 100 mL IVPB        2 g 200 mL/hr over 30 Minutes Intravenous  Once 10/27/2022 1819 11/13/2022 2016   11/10/2022 1830  metroNIDAZOLE (FLAGYL) IVPB 500 mg        500 mg 100 mL/hr over 60 Minutes Intravenous  Once  Dec 01, 2022 1819 December 01, 2022 2130   December 01, 2022 1830  vancomycin (VANCOCIN) IVPB 1000 mg/200 mL premix  Status:  Discontinued        1,000 mg 200 mL/hr over 60 Minutes Intravenous  Once 12-01-22 1819 2022/12/01 1823   2022-12-01 1830  vancomycin (VANCOCIN) IVPB 1000 mg/200 mL premix  Status:  Discontinued       See Hyperspace for full Linked Orders Report.   1,000 mg 200 mL/hr over 60 Minutes Intravenous  Once December 01, 2022 1823 11/02/22 0029        Medications  Scheduled Meds:  sodium chloride   Intravenous Once   sodium chloride   Intravenous Once   acetaminophen  650 mg Oral Q6H   acetaZOLAMIDE  500 mg Oral TID   alteplase  2 mg Intracatheter Once   amLODipine  10 mg Oral Daily   ascorbic acid  250 mg Oral Daily   Chlorhexidine Gluconate Cloth  6 each Topical Q0600   docusate sodium  100 mg Oral BID   famotidine  20 mg Oral BID   ferrous sulfate  325 mg Oral BID WC   gabapentin  100 mg Oral TID   heparin  7,500 Units Subcutaneous Q8H   hydrALAZINE  25 mg Oral Q6H   insulin aspart  0-15 Units Subcutaneous TID AC & HS   insulin glargine-yfgn  50 Units Subcutaneous Daily   levothyroxine  75 mcg Oral Q0600   mouth rinse  15 mL Mouth Rinse 4 times per day   pravastatin  10 mg Oral q1800   QUEtiapine  75 mg Oral QHS   sodium chloride flush  10-40 mL Intracatheter Q12H   Zinc Oxide   Topical TID   Continuous Infusions: PRN Meds:.acetaminophen, calcium carbonate, hydrALAZINE, HYDROmorphone (DILAUDID) injection, labetalol, mouth rinse, mouth rinse, oxyCODONE, oxyCODONE, polyethylene glycol, senna-docusate, sodium chloride  flush    Subjective:   Lindsey [name] was seen and examined today.  No chest pain or sob.   Objective:   Vitals:   11/15/22 2227 11/16/22 0044 11/16/22 0345 11/16/22 0713  BP: (!) 141/71 139/70  (!) 149/69  Pulse: 78 82 83 79  Resp: 20 18    Temp: 98.3 F (36.8 C) 98 F (36.7 C) 98 F (36.7 C) 98 F (36.7 C)  TempSrc: Oral Oral Oral Oral  SpO2: 98% 98%  99%  Weight:      Height:        Intake/Output Summary (Last 24 hours) at 11/16/2022 1154 Last data filed at 11/16/2022 0600 Gross per 24 hour  Intake 2155.75 ml  Output 1800 ml  Net 355.75 ml   Filed Weights   11/09/22 0337 11/10/22 0343 11/14/22 0600  Weight: (!) 214 kg (!) 216 kg (!) 223.3 kg     Exam General exam: Obese lady, on RA,not in distress.  Respiratory system: diminished air entry at bases.  Cardiovascular system: S1 & S2 heard, RRR. No JVD,  Gastrointestinal system: Abdomen is nondistended, soft and nontender.  Central nervous system: drowsy from pain meds.  Extremities:  no cyanosis or clubbing.  Skin: chronic wound in the right perineum. Psychiatry:  unable to assess.     Data Reviewed:  I have personally reviewed following labs and imaging studies   CBC Lab Results  Component Value Date   WBC 10.5 11/16/2022   RBC 2.42 (L) 11/16/2022   HGB 7.1 (L) 11/16/2022   HCT 24.7 (L) 11/16/2022   MCV 102.1 (H) 11/16/2022   MCH 29.3 11/16/2022   PLT 209  11/16/2022   MCHC 28.7 (L) 11/16/2022   RDW 17.2 (H) 11/16/2022   LYMPHSABS 1.7 11/16/2022   MONOABS 0.9 11/16/2022   EOSABS 0.1 11/16/2022   BASOSABS 0.0 81/44/8185     Last metabolic panel Lab Results  Component Value Date   NA 140 11/16/2022   K 3.6 11/16/2022   CL 108 11/16/2022   CO2 23 11/16/2022   BUN 15 11/16/2022   CREATININE 1.04 (H) 11/16/2022   GLUCOSE 176 (H) 11/16/2022   GFRNONAA >60 11/16/2022   GFRAA >60 07/03/2016   CALCIUM 8.4 (L) 11/16/2022   PHOS 3.6 11/10/2022   PROT 6.6 11/14/2022   ALBUMIN 1.6 (L)  11/14/2022   BILITOT 0.5 11/14/2022   ALKPHOS 96 11/14/2022   AST 17 11/14/2022   ALT 20 11/14/2022   ANIONGAP 9 11/16/2022    CBG (last 3)  Recent Labs    11/16/22 0453 11/16/22 0715 11/16/22 1149  GLUCAP 159* 166* 146*      Coagulation Profile: No results for input(s): "INR", "PROTIME" in the last 168 hours.   Radiology Studies: No results found.     Hosie Poisson M.D. [facility] 11/16/2022, 11:54 AM  Available via Epic secure chat 7am-7pm After 7 pm, please refer to night coverage provider listed on amion.

## 2022-11-16 NOTE — Addendum Note (Signed)
Addendum  created 11/16/22 2006 by Santa Lighter, MD   Intraprocedure Event edited, Intraprocedure Staff edited

## 2022-11-16 NOTE — Inpatient Diabetes Management (Signed)
Inpatient Diabetes Program Recommendations  AACE/ADA: New Consensus Statement on Inpatient Glycemic Control   Target Ranges:  Prepandial:   less than 140 mg/dL      Peak postprandial:   less than 180 mg/dL (1-2 hours)      Critically ill patients:  140 - 180 mg/dL    Latest Reference Range & Units 11/15/22 07:56 11/15/22 08:43 11/15/22 11:28 11/15/22 16:27 11/15/22 19:45 11/16/22 00:32 11/16/22 04:53 11/16/22 07:15 11/16/22 11:49 11/16/22 16:41  Glucose-Capillary 70 - 99 mg/dL 66 (L) 107 (H) 93 175 (H) 148 (H) 150 (H) 159 (H) 166 (H) 146 (H) 182 (H)   Review of Glycemic Control Diabetes history: DM2 Outpatient Diabetes medications: Actos 45 mg daily, Amaryl 2 mg daily Current orders for Inpatient glycemic control:  Semglee 20 units Daily Novolog 0-15 units tid   HbgA1C:  A1C 14.7% on 11/21/2022  Spoke with pt and mother at bedside briefly discussed insulin and showed pt how to operate the insulin pen. Pt to be new to insulin. Pt was non compliant with glucose checks and using a meter her mother gave her. Pt would benefit from CGM at time of d/c. Pt was drowsy and would benefit from more education closer to d/c. D/c to possible SNF.     Thanks, Tama Headings RN, MSN, BC-ADM Inpatient Diabetes Coordinator Team Pager [phone] (8a-5p)

## 2022-11-16 NOTE — Progress Notes (Signed)
Inpatient Rehab Admissions Coordinator:   Continues to require significant physical assist for limited mobility.  Feel SNF more appropriate intensity of rehab at this time.   Shann Medal, PT, DPT Admissions Coordinator [phone] 11/16/22  2:25 PM

## 2022-11-16 NOTE — Progress Notes (Signed)
Pt remained alert and oriented throughout shift. Lab called this am to report lab level from this am was not accurate and needed to be redrawn. Order to IV team. Dressing remained dry and intact as well flexi seal and foley catheter.

## 2022-11-16 NOTE — Progress Notes (Signed)
[facility] Progress Note  9 Days Post-Op  Subjective: CC-  Ready for dressing change.  WBC normalized, TMAX 99.7  Objective: Vital signs in last 24 hours: Temp:  [98 F (36.7 C)-98.3 F (36.8 C)] 98 F (36.7 C) (01/24 0713) Pulse Rate:  [78-90] 79 (01/24 0713) Resp:  [18-20] 18 (01/24 0044) BP: (139-150)/(69-86) 149/69 (01/24 0713) SpO2:  [98 %-100 %] 99 % (01/24 0713) Last BM Date : 11/15/22  Intake/Output from previous day: 01/23 0701 - 01/24 0700 In: 2275.8 [P.O.:480; I.V.:1795.8] Out: 3200 [Urine:3200] Intake/Output this shift: No intake/output data recorded.  PE: Gen:  Awake and alert, NAD GU: indwelling foley in place, rectal tube replaced Wound: Dressings removed and there was no blue-green drainage or purulent drainage today - all serous Pictures as noted below.  Perineal and right labial wound communicating with buttock wound with mixture of granulation tissue and fibrinous tissue/exudate at the base of the wound. Wound does track several cm towards right groin - no  loculations; otherwise no significant tracking.  Periwound clean but with significant skin breakdown in skin folds and between thighs that is painful. stool also contaminating the wound where rectal tube was leaking    Lab Results:  Recent Labs    11/15/22 0417 11/16/22 0427  WBC 12.5* 10.5  HGB 7.5* 7.1*  HCT 25.4* 24.7*  PLT 222 209   BMET Recent Labs    11/14/22 1047 11/15/22 0417  NA 142 145  K 5.0 3.8  CL 104 110  CO2 27 26  GLUCOSE 135* 90  BUN 31* 26*  CREATININE 1.69* 1.44*  CALCIUM 8.3* 8.3*   PT/INR No results for input(s): "LABPROT", "INR" in the last 72 hours. CMP     Component Value Date/Time   NA 145 11/15/2022 0417   K 3.8 11/15/2022 0417   CL 110 11/15/2022 0417   CO2 26 11/15/2022 0417   GLUCOSE 90 11/15/2022 0417   BUN 26 (H) 11/15/2022 0417   CREATININE 1.44 (H) 11/15/2022 0417   CALCIUM 8.3 (L) 11/15/2022 0417   PROT 6.6 11/14/2022 1047    ALBUMIN 1.6 (L) 11/14/2022 1047   AST 17 11/14/2022 1047   ALT 20 11/14/2022 1047   ALKPHOS 96 11/14/2022 1047   BILITOT 0.5 11/14/2022 1047   GFRNONAA 48 (L) 11/15/2022 0417   GFRAA >60 07/03/2016 0004   Lipase  No results found for: "LIPASE"     Studies/Results: DG CHEST PORT 1 VIEW  Result Date: 11/14/2022 CLINICAL DATA:  Acute metabolic encephalopathy. EXAM: PORTABLE CHEST 1 VIEW COMPARISON:  11/10/2022 FINDINGS: Under penetrated because of patient's size. Heart size upper limits of normal. No lobar consolidation, collapse or visible effusion. Allowing for technical factors, the lungs could be clear or there could be pulmonary venous hypertension, possibly with mild interstitial edema. IMPRESSION: Under penetrated because of patient's size. Question pulmonary venous hypertension and mild interstitial edema. Electronically Signed   By: Nelson Chimes M.D.   On: 11/14/2022 11:16    Anti-infectives: Anti-infectives (From admission, onward)    Start     Dose/Rate Route Frequency Ordered Stop   11/10/22 1600  Ampicillin-Sulbactam (UNASYN) 3 g in sodium chloride 0.9 % 100 mL IVPB        3 g 200 mL/hr over 30 Minutes Intravenous Every 6 hours 11/10/22 1438 11/13/22 2323   11/10/22 1400  Ampicillin-Sulbactam (UNASYN) 3 g in sodium chloride 0.9 % 100 mL IVPB  Status:  Discontinued        3 g  200 mL/hr over 30 Minutes Intravenous Every 6 hours 11/10/22 1113 11/10/22 1438   11/13/2022 1630  Ampicillin-Sulbactam (UNASYN) 3 g in sodium chloride 0.9 % 100 mL IVPB  Status:  Discontinued        3 g 200 mL/hr over 30 Minutes Intravenous Every 6 hours 10/28/2022 1051 11/10/22 1113   11/02/2022 1600  piperacillin-tazobactam (ZOSYN) IVPB 3.375 g  Status:  Discontinued        3.375 g 12.5 mL/hr over 240 Minutes Intravenous Every 8 hours 11/02/2022 1459 11/12/2022 1031   11/02/22 1400  linezolid (ZYVOX) IVPB 600 mg  Status:  Discontinued        600 mg 300 mL/hr over 60 Minutes Intravenous Every 12 hours  11/02/22 1222 11/04/22 0830   11/02/22 1000  vancomycin (VANCOREADY) IVPB 1250 mg/250 mL  Status:  Discontinued        1,250 mg 166.7 mL/hr over 90 Minutes Intravenous Every 12 hours 11-21-2022 2135 2022-11-21 2137   11/02/22 1000  vancomycin (VANCOREADY) IVPB 1500 mg/300 mL  Status:  Discontinued        1,500 mg 150 mL/hr over 120 Minutes Intravenous Every 12 hours 21-Nov-2022 2137 11/02/22 1222   11/02/22 0300  ceFEPIme (MAXIPIME) 2 g in sodium chloride 0.9 % 100 mL IVPB  Status:  Discontinued        2 g 200 mL/hr over 30 Minutes Intravenous Every 8 hours 2022-11-21 2135 November 21, 2022 2258   11/02/22 0045  vancomycin (VANCOREADY) IVPB 2000 mg/400 mL        2,000 mg 200 mL/hr over 120 Minutes Intravenous  Once 11/02/22 0030 11/02/22 0305   11/21/2022 2315  piperacillin-tazobactam (ZOSYN) IVPB 3.375 g  Status:  Discontinued        3.375 g 12.5 mL/hr over 240 Minutes Intravenous Every 8 hours 11-21-2022 2306 11/16/2022 1459   Nov 21, 2022 2315  clindamycin (CLEOCIN) IVPB 900 mg  Status:  Discontinued        900 mg 100 mL/hr over 30 Minutes Intravenous Every 8 hours 11-21-2022 2306 11/02/22 1222   2022-11-21 1930  vancomycin (VANCOCIN) IVPB 1000 mg/200 mL premix  Status:  Discontinued       See Hyperspace for full Linked Orders Report.   1,000 mg 200 mL/hr over 60 Minutes Intravenous  Once 11-21-2022 1823 11/02/22 0029   2022-11-21 1830  ceFEPIme (MAXIPIME) 2 g in sodium chloride 0.9 % 100 mL IVPB        2 g 200 mL/hr over 30 Minutes Intravenous  Once 2022-11-21 1819 21-Nov-2022 2016   2022/11/21 1830  metroNIDAZOLE (FLAGYL) IVPB 500 mg        500 mg 100 mL/hr over 60 Minutes Intravenous  Once November 21, 2022 1819 Nov 21, 2022 2130   2022-11-21 1830  vancomycin (VANCOCIN) IVPB 1000 mg/200 mL premix  Status:  Discontinued        1,000 mg 200 mL/hr over 60 Minutes Intravenous  Once November 21, 2022 1819 November 21, 2022 1823   2022-11-21 1830  vancomycin (VANCOCIN) IVPB 1000 mg/200 mL premix  Status:  Discontinued       See Hyperspace for full Linked  Orders Report.   1,000 mg 200 mL/hr over 60 Minutes Intravenous  Once 11/21/22 1823 11/02/22 0029        Assessment/Plan S/p debridement of buttock NSTI  11/21/2022 Dr. Rosendo Gros  S/p excisional debridement buttock and perineal wound 10/31/2022 Dr. Donne Hazel S/p excisional debridement perineal wound 11/22/2022 Dr. Rosendo Gros S/P excisional debridement perineal wound and buttock wound 10/29/2022 Dr. Ninfa Linden - No further plans to return  to the OR at this time. Cx's with GBS, strep mitis/oralis, Prevotella bivia. Completed abx. WBC normalized and pt afebrile - Increase oxy to 20mg  for dressing change pre-medication, dilaudid PRN if oral pain medication not adequate - Continue daily dressing changes with normal saline, or PRN for soilage. Surgery will plan to evaluate wounds M/W/F, RN to change dressing independently T/Th/Sat/Sun  - Continue destin (triple paste) to periwound and interdry (will ask WOC to help order this) in left inguinal fold and under right buttock to help wick moisture and prevent further skin breakdown. - Ideally this patient would get a diverting colostomy to keep stool out of her wound but I think her body habitus precludes this.    FEN - carb mod VTE - SQH ID - Clindamycin, Zosyn, Vanc; narrowed to unasyn 1/15>>1/21   - below per CCM -  Septic shock - resolved ARF - improved DM  Morbid obesity - BMI 79 Hypothryoidism PCOS HLD Anxiety/Depression     LOS: 15 days    09-05-1985, [facility] 11/16/2022, 11:01 AM Please see Amion for pager number during day hours 7:00am-4:30pm

## 2022-11-17 DIAGNOSIS — E039 Hypothyroidism, unspecified: Secondary | ICD-10-CM

## 2022-11-17 DIAGNOSIS — I10 Essential (primary) hypertension: Secondary | ICD-10-CM

## 2022-11-17 LAB — CBC WITH DIFFERENTIAL/PLATELET
Abs Immature Granulocytes: 0.07 10*3/uL (ref 0.00–0.07)
Basophils Absolute: 0 10*3/uL (ref 0.0–0.1)
Basophils Relative: 0 %
Eosinophils Absolute: 0.1 10*3/uL (ref 0.0–0.5)
Eosinophils Relative: 1 %
HCT: 25.1 % — ABNORMAL LOW (ref 36.0–46.0)
Hemoglobin: 7.6 g/dL — ABNORMAL LOW (ref 12.0–15.0)
Immature Granulocytes: 1 %
Lymphocytes Relative: 15 %
Lymphs Abs: 1.6 10*3/uL (ref 0.7–4.0)
MCH: 29.2 pg (ref 26.0–34.0)
MCHC: 30.3 g/dL (ref 30.0–36.0)
MCV: 96.5 fL (ref 80.0–100.0)
Monocytes Absolute: 1 10*3/uL (ref 0.1–1.0)
Monocytes Relative: 10 %
Neutro Abs: 7.6 10*3/uL (ref 1.7–7.7)
Neutrophils Relative %: 73 %
Platelets: 251 10*3/uL (ref 150–400)
RBC: 2.6 MIL/uL — ABNORMAL LOW (ref 3.87–5.11)
RDW: 17.1 % — ABNORMAL HIGH (ref 11.5–15.5)
WBC: 10.3 10*3/uL (ref 4.0–10.5)
nRBC: 0 % (ref 0.0–0.2)

## 2022-11-17 LAB — BASIC METABOLIC PANEL
Anion gap: 9 (ref 5–15)
BUN: 19 mg/dL (ref 6–20)
CO2: 22 mmol/L (ref 22–32)
Calcium: 8.1 mg/dL — ABNORMAL LOW (ref 8.9–10.3)
Chloride: 106 mmol/L (ref 98–111)
Creatinine, Ser: 1.41 mg/dL — ABNORMAL HIGH (ref 0.44–1.00)
GFR, Estimated: 50 mL/min — ABNORMAL LOW (ref >60)
Glucose, Bld: 162 mg/dL — ABNORMAL HIGH (ref 70–99)
Potassium: 3.3 mmol/L — ABNORMAL LOW (ref 3.5–5.1)
Sodium: 137 mmol/L (ref 135–145)

## 2022-11-17 LAB — GLUCOSE, CAPILLARY
Glucose-Capillary: 142 mg/dL — ABNORMAL HIGH (ref 70–99)
Glucose-Capillary: 141 mg/dL — ABNORMAL HIGH (ref 70–99)
Glucose-Capillary: 158 mg/dL — ABNORMAL HIGH (ref 70–99)
Glucose-Capillary: 164 mg/dL — ABNORMAL HIGH (ref 70–99)
Glucose-Capillary: 139 mg/dL — ABNORMAL HIGH (ref 70–99)
Glucose-Capillary: 205 mg/dL — ABNORMAL HIGH (ref 70–99)

## 2022-11-17 LAB — MAGNESIUM: Magnesium: 2 mg/dL (ref 1.7–2.4)

## 2022-11-17 MED ORDER — PROSOURCE PLUS PO LIQD
30.0000 mL | Freq: Three times a day (TID) | ORAL | Status: DC
  Administered 2022-11-17 – 2022-11-24 (×18): 30 mL via ORAL
  Filled 2022-11-17 (×21): qty 30

## 2022-11-17 MED ORDER — ENSURE ENLIVE PO LIQD
237.0000 mL | Freq: Two times a day (BID) | ORAL | Status: DC
  Administered 2022-11-17 – 2022-11-25 (×13): 237 mL via ORAL

## 2022-11-17 MED ORDER — HYDROMORPHONE HCL 1 MG/ML IJ SOLN
0.5000 mg | Freq: Every day | INTRAMUSCULAR | Status: DC | PRN
  Administered 2022-11-19 – 2022-11-22 (×6): 1 mg via INTRAVENOUS
  Filled 2022-11-17 (×8): qty 1

## 2022-11-17 MED ORDER — JUVEN PO PACK
1.0000 | PACK | Freq: Two times a day (BID) | ORAL | Status: DC
  Administered 2022-11-17 – 2022-11-24 (×14): 1 via ORAL
  Filled 2022-11-17 (×14): qty 1

## 2022-11-17 MED ORDER — SODIUM CHLORIDE 0.9% FLUSH
10.0000 mL | INTRAVENOUS | Status: DC | PRN
  Administered 2022-11-18 – 2022-11-20 (×2): 10 mL

## 2022-11-17 NOTE — Progress Notes (Addendum)
Physical Therapy Treatment Patient Details Name: Lindsey [name] MRN: [id number] DOB: Mar 21, 1986 Today's Date: 11/17/2022   History of Present Illness 37 year old morbidly obese female who presented Dec 01, 2022 with generalized weakness, shortness of breath and worsening buttock wounds for about a month. +afib with RVR; sepsis; to OR 1/10, 1/11, 1/13, 1/15 for necrotizing fascitis; post-op remained on vent, sedated with propofol, levophed; extubated 1/19;  PMH-diabetes , morbid obesity, pseudotumor cerebri, vision disturbance, idiopathic intracranial hypertension, HTN, panic disorder with agoraphobia.    PT Comments    Pt received in supine, lethargic and A&O x2-3, pt agreeable to therapy session and with fair participation and tolerance for bed mobility and supine UE/LE exercises. RN/MD notified pt more lethargic and with increased confusion this date, pt coughing while attempting to drink thin liquids from cup without a straw and had not attempted to eat lunch for >2 hours, may need more set-up/supervision for meals or SLP consult if she remains altered. Pt needing +2-3 maxA for rolling and partial long sitting and was able to posterior supine scoot with bed in trendelenburg using overhead rails and +2 maxA. Pt continues to benefit from PT services to progress toward functional mobility goals. Disposition updated below per discussion with supervising PT Cyndi W as pt likely to need lower intensity therapies at this time given slow progress toward her goals.  Recommendations for follow up therapy are one component of a multi-disciplinary discharge planning process, led by the attending physician.  Recommendations may be updated based on patient status, additional functional criteria and insurance authorization.  Follow Up Recommendations  Skilled nursing-short term rehab (<3 hours/day) Can patient physically be transported by private vehicle: No   Assistance Recommended at Discharge Frequent or constant  Supervision/Assistance  Patient can return home with the following  (totalA currently)   Equipment Recommendations  Other (comment) (TBD post-acute)    Recommendations for Other Services       Precautions / Restrictions Precautions Precautions: Fall Precaution Comments: Rectal Tube, watch O2 and HR, AMS/lethargy on 1/25 Restrictions Weight Bearing Restrictions: No     Mobility  Bed Mobility Overal bed mobility: Needs Assistance Bed Mobility: Rolling Rolling: Max assist, +2 for physical assistance   Supine to sit: +2 for physical assistance, HOB elevated, Max assist     General bed mobility comments: Bed elevated into semi-chair position and pt cued to use lower rails to pull her torso forward into upright sitting but had difficulty achieving upright today. Pt more lethargic/somnolent so RN/MD notified her effort and command following and orientation were decreased from previous date. Rolling to L/R sides for adjustment of linens with +2 maxA for partial roll.    Transfers                   General transfer comment: TBA; chair position did not allow egress and pt too lethargic to safely attempt    Ambulation/Gait                   Stairs             Wheelchair Mobility    Modified Rankin (Stroke Patients Only)       Balance Overall balance assessment: Needs assistance Sitting-balance support: Bilateral upper extremity supported, Feet unsupported Sitting balance-Leahy Scale: Zero Sitting balance - Comments: pt using bilat side rails for attempt at long sitting x2 trials, limited tolerance due to bottom wound pain, only partially upright trunk from elevated HOB  Cognition Arousal/Alertness: Lethargic Behavior During Therapy: Flat affect Overall Cognitive Status: Impaired/Different from baseline Area of Impairment: Attention, Memory, Following commands, Safety/judgement, Awareness, Problem  solving, Orientation                 Orientation Level: Disoriented to, Time Current Attention Level: Focused Memory: Decreased short-term memory Following Commands: Follows multi-step commands inconsistently, Follows one step commands inconsistently, Follows one step commands with increased time Safety/Judgement: Decreased awareness of deficits Awareness: Intellectual Problem Solving: Slow processing, Requires verbal cues, Decreased initiation, Difficulty sequencing, Requires tactile cues General Comments: Pt more lethargic this date, initially stating "january" as month, then "january" as year, then a few minutes later when re-questioned, pt unable to state month, and when prompted as to the year, pt states "Pickrell". RN notified as this is below previous session orientation. Pt also noted to be coughing        Exercises General Exercises - Lower Extremity Ankle Circles/Pumps: AROM, Both, 10 reps, Supine Quad Sets: AROM, AAROM, Both, 10 reps, Supine Heel Slides: AAROM, Both, 5 reps, Supine Other Exercises Other Exercises: supine BLE AROM: hip IR x 5 reps ea encouraged her to attempt hourly Other Exercises: chair posture bed "crunches" for UE strengthening x2 reps    General Comments        Pertinent Vitals/Pain Pain Assessment Pain Assessment: Faces Faces Pain Scale: Hurts a little bit Pain Location: not localized Pain Descriptors / Indicators: Discomfort, Grimacing, Sore, Tender Pain Intervention(s): Monitored during session, Repositioned, Premedicated before session     PT Goals (current goals can now be found in the care plan section) Acute Rehab PT Goals Patient Stated Goal: decreased pain, to sit up in chair PT Goal Formulation: With patient Time For Goal Achievement: 11/26/22 Progress towards PT goals: Progressing toward goals    Frequency    Min 3X/week      PT Plan Discharge plan needs to be updated       AM-PAC PT "6 Clicks" Mobility    Outcome Measure  Help needed turning from your back to your side while in a flat bed without using bedrails?: Total Help needed moving from lying on your back to sitting on the side of a flat bed without using bedrails?: Total Help needed moving to and from a bed to a chair (including a wheelchair)?: Total Help needed standing up from a chair using your arms (e.g., wheelchair or bedside chair)?: Total Help needed to walk in hospital room?: Total Help needed climbing 3-5 steps with a railing? : Total 6 Click Score: 6    End of Session   Activity Tolerance: Treatment limited secondary to medical complications (Comment);Other (comment);Patient limited by lethargy (RN/MD notified pt lethargy, less oriented and did not eat lunch and unclear whether or not she ate breakfast) Patient left: in bed;with call bell/phone within reach;with bed alarm set;Other (comment) (bed partial chair posture HOB ~50* as pt planning to drink/eat) Nurse Communication: Mobility status;Need for lift equipment;Precautions;Other (comment) (Pt more confused, may need SLP consult if still having difficulty with eating/drinking vs blood sugar check) PT Visit Diagnosis: Muscle weakness (generalized) (M62.81);Difficulty in walking, not elsewhere classified (R26.2)     Time: 4132-4401 PT Time Calculation (min) (ACUTE ONLY): 32 min  Charges:  $Therapeutic Exercise: 8-22 mins $Therapeutic Activity: 8-22 mins                     Marillyn Goren P., PTA Acute Rehabilitation Services Secure Chat Preferred 9a-5:30pm Office: [phone]  Dorathy Kinsman Ronnette Rump 11/17/2022, 5:08 PM

## 2022-11-17 NOTE — Progress Notes (Signed)
IV team consulted.  Noted that no documentation has been done on the PICC line in regards to dressing change.

## 2022-11-17 NOTE — Progress Notes (Signed)
[facility]                                                                               Lindsey [name], is a 37 y.o. female, DOB - August 25, 1986, POE:[id number] Admit date - 11-12-22    Outpatient Primary MD for the patient is Lawerance Cruel, MD  LOS - 16  days    Brief summary   37 year old lady with prior history of type 2 diabetes mellitus, morbid obesity, OHS, hypertension for thyroidism, PCOS, pseudotumor cerebri, depression and anxiety, PTSD presents with worsening right perineal and gluteal region wounds. CT of the abdomen and pelvis consistent with Fournier's gangrene involving the perineum on the right, medial and posteromedial aspects of the right gluteal region. Patient underwent debridement on multiple occasions and currently getting daily dressing changes with normal saline. Therapy evaluations later today.  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  And completed the course of Unasyn.  General surgery and wound care on board, appreciate their management. Daily dressing changes with normal saline. Pain control and nutrition. WBC count normalized Patient was sedated post Dilaudid hence decrease the dose from 2 mg to 0.5 mg as needed for dressing changes.   Type 2 diabetes mellitus Uncontrolled with hypoglycemia and hyperglycemia Last A1c is 14.7 CBG (last 3)  Recent Labs    11/17/22 0631 11/17/22 0727 11/17/22 1203  GLUCAP 141* 158* 164*    His CBGs have been well under 200s so far continue with sliding scale insulin. Start  Semglee at 20 units daily.  Continue monitor CBGs     Acute kidney injury Probably from poor oral intake. Creatinine initially improved to 1 and is back to 1.4 today.  Continue to encourage oral intake.  And repeat  renal parameters tomorrow   Acute respiratory failure in the setting of OHS BiPAP at night and nasal cannula oxygen to keep sats greater than 90%      Essential hypertension Blood pressure parameters are well-controlled Patient currently is on amlodipine-hydralazine     Acute anemia of blood loss from debridement procedures Transfuse to keep hemoglobin greater than 7.  Hemoglobin this morning at 7.6    Hypernatremia from free water deficit Appears to have resolved, continue to monitor    Hypothyroidism Continue with levothyroxine    History of anxiety, depression, PTSD, panic disorder Resume home medication     History of idiopathic intracranial hypertension Continue with Diamox 3 times daily     Physical deconditioning Suspect not going to be a CIR candidate (but they are following) and body habitus precludes SNF unable to go to LTAC due to lack of insurance.  Therapy eval today to see if she can go to CIR.    RN Pressure Injury Documentation:    Malnutrition Type:  Nutrition Problem: Increased nutrient needs Etiology: wound healing   Malnutrition Characteristics:  Signs/Symptoms: estimated needs   Nutrition Interventions:  Interventions: Refer to RD note for recommendations  Estimated body mass index is 79.46  kg/m as calculated from the following:   Height as of this encounter: 5\' 6"  (1.676 m).   Weight as of this encounter: 223.3 kg.  Code Status: Full code DVT Prophylaxis:  heparin injection 7,500 Units Start: 11/11/22 1400 SCDs Start: 11-21-22 2241   Level of Care: Level of care: Telemetry Surgical Family Communication: None at bedside  Disposition Plan:     Remains inpatient appropriate: CIR versus SNF  Procedures:  S/p debridement of buttock NSTI  2022/11/21 Dr. 12/31/22  S/p excisional debridement buttock and perineal wound 11/14/2022 Dr. 01/02/23 S/p excisional debridement perineal wound 11/10/2022 Dr. 11/12/2022 S/P excisional  debridement perineal wound and buttock wound 11/23/2022 Dr. 11/09/22  Consultants:   General surgery  Antimicrobials:   Anti-infectives (From admission, onward)    Start     Dose/Rate Route Frequency Ordered Stop   11/10/22 1600  Ampicillin-Sulbactam (UNASYN) 3 g in sodium chloride 0.9 % 100 mL IVPB        3 g 200 mL/hr over 30 Minutes Intravenous Every 6 hours 11/10/22 1438 11/13/22 2323   11/10/22 1400  Ampicillin-Sulbactam (UNASYN) 3 g in sodium chloride 0.9 % 100 mL IVPB  Status:  Discontinued        3 g 200 mL/hr over 30 Minutes Intravenous Every 6 hours 11/10/22 1113 11/10/22 1438   11/17/2022 1630  Ampicillin-Sulbactam (UNASYN) 3 g in sodium chloride 0.9 % 100 mL IVPB  Status:  Discontinued        3 g 200 mL/hr over 30 Minutes Intravenous Every 6 hours 11/19/2022 1051 11/10/22 1113   10/28/2022 1600  piperacillin-tazobactam (ZOSYN) IVPB 3.375 g  Status:  Discontinued        3.375 g 12.5 mL/hr over 240 Minutes Intravenous Every 8 hours 11/09/2022 1459 11/16/2022 1031   11/02/22 1400  linezolid (ZYVOX) IVPB 600 mg  Status:  Discontinued        600 mg 300 mL/hr over 60 Minutes Intravenous Every 12 hours 11/02/22 1222 11/04/22 0830   11/02/22 1000  vancomycin (VANCOREADY) IVPB 1250 mg/250 mL  Status:  Discontinued        1,250 mg 166.7 mL/hr over 90 Minutes Intravenous Every 12 hours 11/21/2022 2135 November 21, 2022 2137   11/02/22 1000  vancomycin (VANCOREADY) IVPB 1500 mg/300 mL  Status:  Discontinued        1,500 mg 150 mL/hr over 120 Minutes Intravenous Every 12 hours 11-21-22 2137 11/02/22 1222   11/02/22 0300  ceFEPIme (MAXIPIME) 2 g in sodium chloride 0.9 % 100 mL IVPB  Status:  Discontinued        2 g 200 mL/hr over 30 Minutes Intravenous Every 8 hours 2022/11/21 2135 11/21/2022 2258   11/02/22 0045  vancomycin (VANCOREADY) IVPB 2000 mg/400 mL        2,000 mg 200 mL/hr over 120 Minutes Intravenous  Once 11/02/22 0030 11/02/22 0305   November 21, 2022 2315  piperacillin-tazobactam (ZOSYN) IVPB 3.375 g   Status:  Discontinued        3.375 g 12.5 mL/hr over 240 Minutes Intravenous Every 8 hours 2022/11/21 2306 11/23/2022 1459   11/21/2022 2315  clindamycin (CLEOCIN) IVPB 900 mg  Status:  Discontinued        900 mg 100 mL/hr over 30 Minutes Intravenous Every 8 hours 11-21-22 2306 11/02/22 1222   11-21-2022 1930  vancomycin (VANCOCIN) IVPB 1000 mg/200 mL premix  Status:  Discontinued       See Hyperspace for full Linked Orders Report.   1,000 mg 200 mL/hr over 60 Minutes  Intravenous  Once 11/04/2022 1823 11/02/22 0029   11/08/2022 1830  ceFEPIme (MAXIPIME) 2 g in sodium chloride 0.9 % 100 mL IVPB        2 g 200 mL/hr over 30 Minutes Intravenous  Once 11/15/2022 1819 11/18/2022 2016   11/10/2022 1830  metroNIDAZOLE (FLAGYL) IVPB 500 mg        500 mg 100 mL/hr over 60 Minutes Intravenous  Once 11/08/2022 1819 11/09/2022 2130   11/02/2022 1830  vancomycin (VANCOCIN) IVPB 1000 mg/200 mL premix  Status:  Discontinued        1,000 mg 200 mL/hr over 60 Minutes Intravenous  Once 11/21/2022 1819 10/29/2022 1823   11/02/2022 1830  vancomycin (VANCOCIN) IVPB 1000 mg/200 mL premix  Status:  Discontinued       See Hyperspace for full Linked Orders Report.   1,000 mg 200 mL/hr over 60 Minutes Intravenous  Once 10/30/2022 1823 11/02/22 0029        Medications  Scheduled Meds:  (feeding supplement) PROSource Plus  30 mL Oral TID BM   sodium chloride   Intravenous Once   sodium chloride   Intravenous Once   acetaminophen  650 mg Oral Q6H   acetaZOLAMIDE  500 mg Oral TID   alteplase  2 mg Intracatheter Once   amLODipine  10 mg Oral Daily   ascorbic acid  250 mg Oral Daily   Chlorhexidine Gluconate Cloth  6 each Topical Q0600   docusate sodium  100 mg Oral BID   famotidine  20 mg Oral BID   feeding supplement  237 mL Oral BID BM   ferrous sulfate  325 mg Oral BID WC   gabapentin  100 mg Oral TID   heparin  7,500 Units Subcutaneous Q8H   hydrALAZINE  25 mg Oral Q6H   insulin aspart  0-15 Units Subcutaneous TID AC & HS    insulin glargine-yfgn  20 Units Subcutaneous Daily   levothyroxine  75 mcg Oral Q0600   nutrition supplement (JUVEN)  1 packet Oral BID BM   mouth rinse  15 mL Mouth Rinse 4 times per day   pravastatin  10 mg Oral q1800   QUEtiapine  75 mg Oral QHS   sodium chloride flush  10-40 mL Intracatheter Q12H   Zinc Oxide   Topical TID   Continuous Infusions: PRN Meds:.acetaminophen, calcium carbonate, hydrALAZINE, HYDROmorphone (DILAUDID) injection, labetalol, mouth rinse, mouth rinse, oxyCODONE, oxyCODONE, polyethylene glycol, senna-docusate, sodium chloride flush    Subjective:   Lindsey [name] was seen and examined today. No new complaints.   Objective:   Vitals:   11/17/22 0200 11/17/22 0500 11/17/22 0732 11/17/22 0746  BP: (!) 175/77  (!) 85/71 138/70  Pulse:   79 82  Resp: (!) 23     Temp:   98 F (36.7 C)   TempSrc:   Oral   SpO2:  99% 95% 96%  Weight:      Height:       No intake or output data in the 24 hours ending 11/17/22 1336  Filed Weights   11/09/22 0337 11/10/22 0343 11/14/22 0600  Weight: (!) 214 kg (!) 216 kg (!) 223.3 kg     Exam General exam: Appears calm and comfortable  Respiratory system: Clear to auscultation. Respiratory effort normal. Cardiovascular system: S1 & S2 heard, RRR. No JVD, No pedal edema. Gastrointestinal system: Abdomen is nondistended, soft and nontender.  Normal bowel sounds heard. Central nervous system: Alert and oriented. No focal neurological deficits. Extremities: no  cyanosis.  Skin: right perineum chronic wound.  Psychiatry: Mood & affect appropriate.      Data Reviewed:  I have personally reviewed following labs and imaging studies   CBC Lab Results  Component Value Date   WBC 10.3 11/17/2022   RBC 2.60 (L) 11/17/2022   HGB 7.6 (L) 11/17/2022   HCT 25.1 (L) 11/17/2022   MCV 96.5 11/17/2022   MCH 29.2 11/17/2022   PLT 251 11/17/2022   MCHC 30.3 11/17/2022   RDW 17.1 (H) 11/17/2022   LYMPHSABS 1.6 11/17/2022    MONOABS 1.0 11/17/2022   EOSABS 0.1 11/17/2022   BASOSABS 0.0 11/17/2022     Last metabolic panel Lab Results  Component Value Date   NA 137 11/17/2022   K 3.3 (L) 11/17/2022   CL 106 11/17/2022   CO2 22 11/17/2022   BUN 19 11/17/2022   CREATININE 1.41 (H) 11/17/2022   GLUCOSE 162 (H) 11/17/2022   GFRNONAA 50 (L) 11/17/2022   GFRAA >60 07/03/2016   CALCIUM 8.1 (L) 11/17/2022   PHOS 3.6 December 02, 2022   PROT 6.6 11/14/2022   ALBUMIN 1.6 (L) 11/14/2022   BILITOT 0.5 11/14/2022   ALKPHOS 96 11/14/2022   AST 17 11/14/2022   ALT 20 11/14/2022   ANIONGAP 9 11/17/2022    CBG (last 3)  Recent Labs    11/17/22 0631 11/17/22 0727 11/17/22 1203  GLUCAP 141* 158* 164*       Coagulation Profile: No results for input(s): "INR", "PROTIME" in the last 168 hours.   Radiology Studies: No results found.     Kathlen Mody M.D. [facility] 11/17/2022, 1:36 PM  Available via Epic secure chat 7am-7pm After 7 pm, please refer to night coverage provider listed on amion.

## 2022-11-17 NOTE — Progress Notes (Signed)
Nutrition Follow-up  DOCUMENTATION CODES:  Morbid obesity  INTERVENTION:  Ensure Enlive po BID, each supplement provides 350 kcal and 20 grams of protein. 30 ml ProSource Plus TID, each supplement provides 100 kcals and 15 grams protein.  Juven BID to support wound healing Continue Vitamin C 250mg  daily "Plate Method" and "Carbohydrate Counting for People with Diabetes" handout added to AVS  NUTRITION DIAGNOSIS:  Increased nutrient needs related to wound healing as evidenced by estimated needs. - remains applicable  GOAL:  Patient will meet greater than or equal to 90% of their needs - goal unmet, addressing via meals and supplements  MONITOR:  PO intake, Supplement acceptance, Labs, Weight trends, Skin  REASON FOR ASSESSMENT:  Ventilator    ASSESSMENT:   Pt with hx of DM type 2, HTN, HLD and hypothyroidism presented to ED with worsening pain from known boils on her buttocks. Imaging in ED showed large gas containing infection of the right gluteus.  1/10 - Op, I&D, returned to OR on vent 1/11 - Op, I&D    1/15 - Op I&D, Cortrak placed (tip in gastric antrum)   1/19 - extubated 1/20 - diet advanced to carb modified  Surgery evaluating wounds MWF. Nursing to provide wound care all other days.   Noted pt not candidate for CIR, considering LTACH for ongoing wound care management.   Attempted to speak with pt at bedside. She opened her, closed them and did not respond to RD questions. No family present at bedside.   Spoke with RN in the hallway who reports pt is not eating great. He reports that she ate 50% for lunch yesterday and did not eat dinner from night nurse report. He states that she is only requesting juices at this time for beverages.   Pt wound benefit from the addition of nutrition supplements to assist in meeting pt's increased nutritional needs and to support wound healing.   DM coordinator working on insulin recommendations.  Edema: moderate pitting  generalized, RUE, BLE   Medications: Vitamin C 250mg  daily, colace, pepcid, ferrous  sulfate, SSI 0-15 units TID, semglee 20 units daily  Labs: potassium 3.3, Cr 1.41, GFR 50  CBG's 141-182 x24 hours  Diet Order:   Diet Order             Diet Carb Modified Fluid consistency: Thin; Room service appropriate? Yes  Diet effective now                   EDUCATION NEEDS:   Not appropriate for education at this time  Skin:  Skin Assessment: Reviewed RN Assessment (fournier's gangrene of perineum and buttocks)  Last BM:  1/25 via FMS, type 7  Height:   Ht Readings from Last 1 Encounters:  2022/11/26 5\' 6"  (1.676 m)    Weight:   Wt Readings from Last 1 Encounters:  11/14/22 (!) 223.3 kg    Ideal Body Weight:  59.1 kg  BMI:  Body mass index is 79.46 kg/m.  Estimated Nutritional Needs:   Kcal:  2700-3000 kcal/d  Protein:  135-150 g/d  Fluid:  2.2-2.5L/d  Clayborne Dana, RDN, LDN Clinical Nutrition

## 2022-11-17 NOTE — Discharge Instructions (Signed)

## 2022-11-17 NOTE — Progress Notes (Signed)
CSW attempted to meet with pt for initial assessment, DC planning.  Pt closed eyes after CSW entered the room, did not respond to questions. CSW will attempt again later.   Lurline Idol, MSW, LCSW 1/25/20241:20 PM

## 2022-11-18 LAB — GLUCOSE, CAPILLARY
Glucose-Capillary: 147 mg/dL — ABNORMAL HIGH (ref 70–99)
Glucose-Capillary: 161 mg/dL — ABNORMAL HIGH (ref 70–99)
Glucose-Capillary: 174 mg/dL — ABNORMAL HIGH (ref 70–99)
Glucose-Capillary: 188 mg/dL — ABNORMAL HIGH (ref 70–99)
Glucose-Capillary: 198 mg/dL — ABNORMAL HIGH (ref 70–99)
Glucose-Capillary: 209 mg/dL — ABNORMAL HIGH (ref 70–99)
Glucose-Capillary: 215 mg/dL — ABNORMAL HIGH (ref 70–99)
Glucose-Capillary: 221 mg/dL — ABNORMAL HIGH (ref 70–99)

## 2022-11-18 LAB — CBC WITH DIFFERENTIAL/PLATELET
Abs Immature Granulocytes: 0.04 10*3/uL (ref 0.00–0.07)
Abs Immature Granulocytes: 0.05 10*3/uL (ref 0.00–0.07)
Basophils Absolute: 0 10*3/uL (ref 0.0–0.1)
Basophils Absolute: 0 10*3/uL (ref 0.0–0.1)
Basophils Relative: 0 %
Basophils Relative: 0 %
Eosinophils Absolute: 0.1 10*3/uL (ref 0.0–0.5)
Eosinophils Absolute: 0.1 10*3/uL (ref 0.0–0.5)
Eosinophils Relative: 1 %
Eosinophils Relative: 2 %
HCT: 24.5 % — ABNORMAL LOW (ref 36.0–46.0)
HCT: 25.6 % — ABNORMAL LOW (ref 36.0–46.0)
Hemoglobin: 7.5 g/dL — ABNORMAL LOW (ref 12.0–15.0)
Hemoglobin: 7.6 g/dL — ABNORMAL LOW (ref 12.0–15.0)
Immature Granulocytes: 1 %
Immature Granulocytes: 1 %
Lymphocytes Relative: 23 %
Lymphocytes Relative: 20 %
Lymphs Abs: 1.9 10*3/uL (ref 0.7–4.0)
Lymphs Abs: 2 10*3/uL (ref 0.7–4.0)
MCH: 29.4 pg (ref 26.0–34.0)
MCH: 29 pg (ref 26.0–34.0)
MCHC: 30.6 g/dL (ref 30.0–36.0)
MCHC: 29.7 g/dL — ABNORMAL LOW (ref 30.0–36.0)
MCV: 96.1 fL (ref 80.0–100.0)
MCV: 97.7 fL (ref 80.0–100.0)
Monocytes Absolute: 0.8 10*3/uL (ref 0.1–1.0)
Monocytes Absolute: 1 10*3/uL (ref 0.1–1.0)
Monocytes Relative: 9 %
Monocytes Relative: 10 %
Neutro Abs: 5.6 10*3/uL (ref 1.7–7.7)
Neutro Abs: 6.4 10*3/uL (ref 1.7–7.7)
Neutrophils Relative %: 66 %
Neutrophils Relative %: 67 %
Platelets: 260 10*3/uL (ref 150–400)
Platelets: 257 10*3/uL (ref 150–400)
RBC: 2.55 MIL/uL — ABNORMAL LOW (ref 3.87–5.11)
RBC: 2.62 MIL/uL — ABNORMAL LOW (ref 3.87–5.11)
RDW: 17 % — ABNORMAL HIGH (ref 11.5–15.5)
RDW: 16.9 % — ABNORMAL HIGH (ref 11.5–15.5)
WBC: 8.5 10*3/uL (ref 4.0–10.5)
WBC: 9.6 10*3/uL (ref 4.0–10.5)
nRBC: 0 % (ref 0.0–0.2)
nRBC: 0 % (ref 0.0–0.2)

## 2022-11-18 LAB — BASIC METABOLIC PANEL
Anion gap: 7 (ref 5–15)
BUN: 18 mg/dL (ref 6–20)
CO2: 23 mmol/L (ref 22–32)
Calcium: 8.1 mg/dL — ABNORMAL LOW (ref 8.9–10.3)
Chloride: 108 mmol/L (ref 98–111)
Creatinine, Ser: 1.05 mg/dL — ABNORMAL HIGH (ref 0.44–1.00)
GFR, Estimated: >60 mL/min (ref >60)
Glucose, Bld: 192 mg/dL — ABNORMAL HIGH (ref 70–99)
Potassium: 2.9 mmol/L — ABNORMAL LOW (ref 3.5–5.1)
Sodium: 138 mmol/L (ref 135–145)

## 2022-11-18 LAB — MAGNESIUM: Magnesium: 2 mg/dL (ref 1.7–2.4)

## 2022-11-18 LAB — POTASSIUM: Potassium: 3.1 mmol/L — ABNORMAL LOW (ref 3.5–5.1)

## 2022-11-18 MED ORDER — POTASSIUM CHLORIDE 10 MEQ/100ML IV SOLN
10.0000 meq | INTRAVENOUS | Status: AC
  Administered 2022-11-18 (×2): 10 meq via INTRAVENOUS
  Filled 2022-11-18 (×2): qty 100

## 2022-11-18 MED ORDER — POTASSIUM CHLORIDE CRYS ER 20 MEQ PO TBCR
40.0000 meq | EXTENDED_RELEASE_TABLET | Freq: Once | ORAL | Status: AC
  Administered 2022-11-18: 40 meq via ORAL
  Filled 2022-11-18: qty 2

## 2022-11-18 MED ORDER — ONDANSETRON HCL 4 MG PO TABS
4.0000 mg | ORAL_TABLET | Freq: Three times a day (TID) | ORAL | Status: DC | PRN
  Administered 2022-11-19 – 2022-11-20 (×4): 4 mg via ORAL
  Filled 2022-11-18 (×4): qty 1

## 2022-11-18 MED ORDER — INSULIN ASPART 100 UNIT/ML IJ SOLN
3.0000 [IU] | Freq: Three times a day (TID) | INTRAMUSCULAR | Status: DC
  Administered 2022-11-18 – 2022-11-20 (×6): 3 [IU] via SUBCUTANEOUS

## 2022-11-18 NOTE — Evaluation (Signed)
Clinical/Bedside Swallow Evaluation Patient Details  Name: Roy Snuffer MRN: [id number] Date of Birth: 07-03-1986  Today's Date: 11/18/2022 Time: SLP Start Time (ACUTE ONLY): 1122 SLP Stop Time (ACUTE ONLY): 6629 SLP Time Calculation (min) (ACUTE ONLY): 10 min  Past Medical History:  Past Medical History:  Diagnosis Date   Anxiety and depression    Depression 02/22/2015   Diabetes ([place])    Headache(784.0)    High cholesterol    Hypercholesteremia    Hyperlipidemia    Hypertension    Hypothyroidism    Obesity    Panic disorder    Polycystic ovary    Pseudotumor cerebri    PTSD (post-traumatic stress disorder)    Past Surgical History:  Past Surgical History:  Procedure Laterality Date   INCISION AND DRAINAGE PERIRECTAL ABSCESS N/A 11/24/22   Procedure: EXCISIONAL DEBRIDEMENT PERINEAL WOUND;  Surgeon: Ralene Ok, MD;  Location: [place];  Service: General;  Laterality: N/A;   IRRIGATION AND DEBRIDEMENT ABSCESS N/A 10/30/2022   Procedure: EXCISIONAL DEBRIDEMENT PERINEAL WOUND;  Surgeon: Rolm Bookbinder, MD;  Location: [place];  Service: General;  Laterality: N/A;   IRRIGATION AND DEBRIDEMENT ABSCESS N/A 11/20/2022   Procedure: IRRIGATION AND EXCISIONAL DEBRIDEMENT PERINEAL WOUND;  Surgeon: Coralie Keens, MD;  Location: [place];  Service: General;  Laterality: N/A;   IRRIGATION AND DEBRIDEMENT BUTTOCKS Right 11/12/2022   Procedure: IRRIGATION AND DEBRIDEMENT BUTTOCKS;  Surgeon: Ralene Ok, MD;  Location: [place];  Service: General;  Laterality: Right;   LUMBAR PUNCTURE     WOUND DEBRIDEMENT N/A 11/20/2022   Procedure: EXCISIONAL DEBRIDEMENT OF BUTTOCK;  Surgeon: Rolm Bookbinder, MD;  Location: [place];  Service: General;  Laterality: N/A;   WOUND DEBRIDEMENT N/A 11/23/2022   Procedure: DEBRIDEMENT  BUTTOCK WOUND AND DRESSING CHANGE UNDER ANESTHESIA;  Surgeon: Coralie Keens, MD;  Location: [place];  Service: General;  Laterality: N/A;   HPI:  Pt is a 37 y.o. female who  presented 1/9 with fatigue, dyspnea, and worsening gluteal wounds.Pt with afib with RVR, sepsis. Pt s/p debridement 1/10, 1/11, 11/24/2022, 1/15 for necrotizing fascitis. Post-op pt remained on the vent; ETT 1/15-1/19. Pt passed the [facility] 1/20 and a regular texture diet initiated thereafter. CXR 1/22: No lobar consolidation, collapse or visible effusion. PMH: type 2 diabetes mellitus, morbid obesity, OHS, hypertension for thyroidism, PCOS, pseudotumor cerebri, depression and anxiety, PTSD    Assessment / Plan / Recommendation  Clinical Impression  Pt was seen for bedside swallow evaluation. She denied a history of dysphagia prior to admission; she stated that since surgery she has been coughing with liquids when she takes too large of a drink, but that this has been improving. Oral motor strength and ROM appeared WFL. Vocal quality was hoarse, but pt reported this to be her baseline even though she stated her mother disagrees. Dentition was natural and adequate. She exhibited signs of aspiration with a single bolus of thin liquids which was very large, but her swallow mechanism otherwise appeared [facility]. A regular texture diet with thin liquids is recommended at this time with observance of swallowing precautions. SLP will follow briefly to ensure tolerance and to assess further needs. SLP Visit Diagnosis: Dysphagia, unspecified (R13.10)    Aspiration Risk  Mild aspiration risk    Diet Recommendation Regular;Thin liquid   Liquid Administration via: Straw;Cup Medication Administration: Whole meds with liquid Supervision: Patient able to self feed Compensations: Slow rate;Small sips/bites Postural Changes: Seated upright at 90 degrees    Other  Recommendations Oral Care Recommendations: Oral  care BID    Recommendations for follow up therapy are one component of a multi-disciplinary discharge planning process, led by the attending physician.  Recommendations may be updated based on patient status, additional  functional criteria and insurance authorization.  Follow up Recommendations  (TBD)      Assistance Recommended at Discharge    Functional Status Assessment Patient has had a recent decline in their functional status and demonstrates the ability to make significant improvements in function in a reasonable and predictable amount of time.  Frequency and Duration min 2x/week  2 weeks       Prognosis Prognosis for Safe Diet Advancement: Good      Swallow Study   General Date of Onset: 11/16/22 HPI: Pt is a 37 y.o. female who presented 1/9 with fatigue, dyspnea, and worsening gluteal wounds.Pt with afib with RVR, sepsis. Pt s/p debridement 1/10, 1/11, 1/13, 11-16-2022 for necrotizing fascitis. Post-op pt remained on the vent; ETT 1/15-1/19. Pt passed the [facility] 1/20 and a regular texture diet initiated thereafter. CXR 1/22: No lobar consolidation, collapse or visible effusion. PMH: type 2 diabetes mellitus, morbid obesity, OHS, hypertension for thyroidism, PCOS, pseudotumor cerebri, depression and anxiety, PTSD Type of Study: Bedside Swallow Evaluation Previous Swallow Assessment: none stated Diet Prior to this Study: Regular;Thin liquids Temperature Spikes Noted: No Respiratory Status: Room air History of Recent Intubation: Yes Length of Intubations (days): 4 days Date extubated: 11/11/22 Behavior/Cognition: Alert;Cooperative;Pleasant mood Oral Cavity Assessment: Within Functional Limits Oral Care Completed by SLP: Yes Vision: Functional for self-feeding Self-Feeding Abilities: Able to feed self Patient Positioning: Upright in bed Baseline Vocal Quality: Hoarse Volitional Cough: Strong Volitional Swallow: Able to elicit    Oral/Motor/Sensory Function Overall Oral Motor/Sensory Function: Within functional limits   Ice Chips Ice chips: Within functional limits Presentation: Spoon   Thin Liquid Thin Liquid: Impaired Presentation: Straw Pharyngeal  Phase Impairments: Cough - Immediate     Nectar Thick Nectar Thick Liquid: Not tested   Honey Thick Honey Thick Liquid: Not tested   Puree Puree: Within functional limits Presentation: Spoon   Solid    Denis Koppel I. Hardin Negus, MS, [place] Office number [phone] Solid: Within functional limits Presentation: Self Fed      [place] Marshall 11/18/2022,11:42 AM

## 2022-11-18 NOTE — Progress Notes (Signed)
Physical Therapy Treatment Patient Details Name: Lindsey [name] MRN: [id number] DOB: Mar 06, 1986 Today's Date: 11/18/2022   History of Present Illness 37 year old morbidly obese female who presented 11/01/22 with generalized weakness, shortness of breath and worsening buttock wounds for about a month. +afib with RVR; sepsis; to OR 1/10, 1/11, 1/13, 1/15 for necrotizing fascitis; post-op remained on vent, sedated with propofol, levophed; extubated 1/19;  PMH-diabetes , morbid obesity, pseudotumor cerebri, vision disturbance, idiopathic intracranial hypertension, HTN, panic disorder with agoraphobia.    PT Comments    Pt is slowly progressing towards goals. Pt is demonstrating decreased awareness of deficits stating "I want to get up and walk." Upon entering room. Pt is demonstrating significant generalized weakness and was Max A +2 to sit up on Kreg bed in chair position without back support. Pt was unable to progress further but was able to progress to sitting up without back or UE support for ~10 min which is an improvement from last session. She was able to roll at Max A which is an improvement from her last session; with verbal sequencing for movement. Due to pt current functional status, home set up and available assistance recommending skilled physical therapy services in SNF setting on discharge from acute care hospital setting in order to decrease risk for immobility, impaired skin integrity, infection and re-hospitalization. Pt was short of breathe with very little mobility and required frequent rest breaks.    Recommendations for follow up therapy are one component of a multi-disciplinary discharge planning process, led by the attending physician.  Recommendations may be updated based on patient status, additional functional criteria and insurance authorization.  Follow Up Recommendations  Skilled nursing-short term rehab (<3 hours/day) Can patient physically be transported by private vehicle:  No   Assistance Recommended at Discharge Frequent or constant Supervision/Assistance  Patient can return home with the following Two people to help with walking and/or transfers;Assistance with cooking/housework;Assist for transportation;Help with stairs or ramp for entrance   Equipment Recommendations  Other (comment) (defer to post acute)    Recommendations for Other Services       Precautions / Restrictions Precautions Precautions: Fall Precaution Comments: Rectal Tube, watch O2 and HR Restrictions Weight Bearing Restrictions: No     Mobility  Bed Mobility Overal bed mobility: Needs Assistance Bed Mobility: Rolling, Supine to Sit, Sit to Supine Rolling: Max assist   Supine to sit: +2 for physical assistance, HOB elevated, Max assist Sit to supine: Max assist, +2 for physical assistance   General bed mobility comments: Pt performed sitting in Loop bed in chair position bringing trunk up from elevated HOB with 2 person Max A 3x. Pt was unable to understand instruction to asssit for the first 2 repetitions for the 3rd repetition pt was able to assist pulling herself up using bed rails. Patient Response: Cooperative, Flat affect, Impulsive  Transfers                   General transfer comment: Pt was able to sit up without back support with bed in chair position. Difficulty keeping feet on foot board    Ambulation/Gait               General Gait Details: unable at this time due to current functional status.       Balance Overall balance assessment: Needs assistance Sitting-balance support: Bilateral upper extremity supported, Feet unsupported Sitting balance-Leahy Scale: Zero Sitting balance - Comments: Pt was able to sit on Kreg bed without back support with LE  lowered wtih feet on foot plate initially at Max A +2 heavy assistance. pt was able to progress to sitting without UE support using the trunk to hold herself up.        Cognition  Arousal/Alertness: Awake/alert Behavior During Therapy: Flat affect Overall Cognitive Status: Impaired/Different from baseline Area of Impairment: Safety/judgement, Awareness, Problem solving, Following commands             Following Commands: Follows one step commands inconsistently, Follows one step commands with increased time Safety/Judgement: Decreased awareness of deficits, Decreased awareness of safety   Problem Solving: Slow processing, Decreased initiation, Difficulty sequencing, Requires verbal cues, Requires tactile cues             General Comments General comments (skin integrity, edema, etc.): PT HR increased to 112 bpm during session and Spo2 reading was difficult due to movement. Pt did get short of breathe with minimal activity requiring rest breaks between activities.      Pertinent Vitals/Pain Pain Assessment Pain Assessment: No/denies pain     PT Goals (current goals can now be found in the care plan section) Acute Rehab PT Goals Patient Stated Goal: decreased pain, to sit up in chair PT Goal Formulation: With patient Time For Goal Achievement: 11/26/22 Potential to Achieve Goals: Good Progress towards PT goals: Progressing toward goals    Frequency    Min 3X/week      PT Plan Current plan remains appropriate       AM-PAC PT "6 Clicks" Mobility   Outcome Measure  Help needed turning from your back to your side while in a flat bed without using bedrails?: Total Help needed moving from lying on your back to sitting on the side of a flat bed without using bedrails?: Total Help needed moving to and from a bed to a chair (including a wheelchair)?: Total Help needed standing up from a chair using your arms (e.g., wheelchair or bedside chair)?: Total Help needed to walk in hospital room?: Total Help needed climbing 3-5 steps with a railing? : Total 6 Click Score: 6    End of Session Equipment Utilized During Treatment: Oxygen Activity  Tolerance: Patient tolerated treatment well;Patient limited by fatigue Patient left: in bed;with call bell/phone within reach;with bed alarm set Nurse Communication: Mobility status PT Visit Diagnosis: Muscle weakness (generalized) (M62.81);Difficulty in walking, not elsewhere classified (R26.2)     Time: 2993-7169 PT Time Calculation (min) (ACUTE ONLY): 34 min  Charges:  $Therapeutic Activity: 8-22 mins                    Lindsey [name], DPT, CLT  Acute Rehabilitation Services Office: [phone] (Secure chat preferred)    Lindsey [name] 11/18/2022, 12:35 PM

## 2022-11-18 NOTE — Progress Notes (Signed)
RT attempted to place pt on BIPAP for rest. Pt claims nasal mask and full face mask are too uncomfortable after multiple attempts, pt would like to stick with nasal cannula to rest for the night. Agreed to attempt again tomorrow.

## 2022-11-18 NOTE — Progress Notes (Signed)
Attempted to arrange dressing change for 1000 today. When I arrived to the patients room she told me her dressing was changed at 0500. If her dressing gets contaminated today then I will try to come by during RN dressing change to assess the wound. On Monday and on Wednesday the wound looked ok, with no need to return to the OR for further debridement.   If I am unable to assess the wound later today then surgery will plan to perform wound check again on Monday 1/29. Please do not change dressing third shift on Sunday into Monday unless the wound gets contaminated with stool necessitating a change.    Obie Dredge, PA-C [facility] Please see Amion for pager number during day hours 7:00am-4:30pm

## 2022-11-18 NOTE — Progress Notes (Signed)
BIPAP dreamstation at bedside. Pt currently on room air. No respiratory distress noted. RT will monitor as needed.

## 2022-11-18 NOTE — Progress Notes (Signed)
[facility]                                                                               Lindsey [name], is a 37 y.o. female, DOB - 1986/03/02, ZOX:[id number] Admit date - November 20, 2022    Outpatient Primary MD for the patient is Lawerance Cruel, MD  LOS - 17  days    Brief summary   37 year old lady with prior history of type 2 diabetes mellitus, morbid obesity, OHS, hypertension for thyroidism, PCOS, pseudotumor cerebri, depression and anxiety, PTSD presents with worsening right perineal and gluteal region wounds. CT of the abdomen and pelvis consistent with Fournier's gangrene involving the perineum on the right, medial and posteromedial aspects of the right gluteal region. Patient underwent debridement on multiple occasions and currently getting daily dressing changes with normal saline. Therapy evaluations later today.  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  And completed the course of Unasyn.  General surgery and wound care on board, appreciate their management. Daily dressing changes with normal saline. Pain control and nutrition. WBC count normalized Patient was sedated post Dilaudid hence decrease the dose from 2 mg to 0.5 mg as needed for dressing changes.   Type 2 diabetes mellitus Uncontrolled with hypoglycemia and hyperglycemia Last A1c is 14.7 CBG (last 3)  Recent Labs    11/18/22 0739 11/18/22 1157 11/18/22 1309  GLUCAP 188* 209* 198*    His CBGs have been well under 200s so far continue with sliding scale insulin. Start  Semglee at 20 units daily. Add novolog 3 units tidac.  Continue monitor CBGs     Acute kidney injury Probably from poor oral intake. Creatinine at 1 today.    Hypokalemia:  Replaced. Recheck tonight.  Magnesium  level wnl.    Acute respiratory failure in the setting of OHS BiPAP at night and nasal cannula oxygen to keep sats greater than 90%      Essential hypertension Blood pressure parameters are well controlled.  Patient currently is on amlodipine-hydralazine     Acute anemia of blood loss from debridement procedures Transfuse to keep hemoglobin greater than 7.  Hemoglobin this morning at 7.6    Hypernatremia from free water deficit Appears to have resolved, continue to monitor    Hypothyroidism Continue with levothyroxine    History of anxiety, depression, PTSD, panic disorder Resume home medication     History of idiopathic intracranial hypertension Continue with Diamox 3 times daily     Physical deconditioning Suspect not going to be a CIR candidate (but they are following) and body habitus precludes SNF unable to go to LTAC due to lack of insurance.  Therapy eval recommending SNF.    RN Pressure Injury Documentation:    Malnutrition Type:  Nutrition Problem: Increased nutrient needs Etiology: wound healing   Malnutrition Characteristics:  Signs/Symptoms: estimated needs   Nutrition Interventions:  Interventions: Refer to RD note for recommendations  Estimated body mass index is 79.46 kg/m as calculated from the following:  Height as of this encounter: 5\' 6"  (1.676 m).   Weight as of this encounter: 223.3 kg.  Code Status: Full code DVT Prophylaxis:  heparin injection 7,500 Units Start: 11/11/22 1400 SCDs Start: 2022/11/09 2241   Level of Care: Level of care: Telemetry Surgical Family Communication: None at bedside  Disposition Plan:     Remains inpatient appropriate: CIR versus SNF  Procedures:  S/p debridement of buttock NSTI  09-Nov-2022 Dr. Rosendo Gros  S/p excisional debridement buttock and perineal wound 10/30/2022 Dr. Donne Hazel S/p excisional debridement perineal wound 10/24/2022 Dr. Rosendo Gros S/P excisional debridement perineal wound and  buttock wound 10/24/2022 Dr. Ninfa Linden  Consultants:   General surgery  Antimicrobials:   Anti-infectives (From admission, onward)    Start     Dose/Rate Route Frequency Ordered Stop   11/10/22 1600  Ampicillin-Sulbactam (UNASYN) 3 g in sodium chloride 0.9 % 100 mL IVPB        3 g 200 mL/hr over 30 Minutes Intravenous Every 6 hours 11/10/22 1438 11/13/22 2323   11/10/22 1400  Ampicillin-Sulbactam (UNASYN) 3 g in sodium chloride 0.9 % 100 mL IVPB  Status:  Discontinued        3 g 200 mL/hr over 30 Minutes Intravenous Every 6 hours 11/10/22 1113 11/10/22 1438   10/29/2022 1630  Ampicillin-Sulbactam (UNASYN) 3 g in sodium chloride 0.9 % 100 mL IVPB  Status:  Discontinued        3 g 200 mL/hr over 30 Minutes Intravenous Every 6 hours 11/10/2022 1051 11/10/22 1113   11/20/2022 1600  piperacillin-tazobactam (ZOSYN) IVPB 3.375 g  Status:  Discontinued        3.375 g 12.5 mL/hr over 240 Minutes Intravenous Every 8 hours 11/18/2022 1459 10/24/2022 1031   11/02/22 1400  linezolid (ZYVOX) IVPB 600 mg  Status:  Discontinued        600 mg 300 mL/hr over 60 Minutes Intravenous Every 12 hours 11/02/22 1222 11/04/22 0830   11/02/22 1000  vancomycin (VANCOREADY) IVPB 1250 mg/250 mL  Status:  Discontinued        1,250 mg 166.7 mL/hr over 90 Minutes Intravenous Every 12 hours 2022-11-09 2135 11-09-22 2137   11/02/22 1000  vancomycin (VANCOREADY) IVPB 1500 mg/300 mL  Status:  Discontinued        1,500 mg 150 mL/hr over 120 Minutes Intravenous Every 12 hours Nov 09, 2022 2137 11/02/22 1222   11/02/22 0300  ceFEPIme (MAXIPIME) 2 g in sodium chloride 0.9 % 100 mL IVPB  Status:  Discontinued        2 g 200 mL/hr over 30 Minutes Intravenous Every 8 hours 2022-11-09 2135 Nov 09, 2022 2258   11/02/22 0045  vancomycin (VANCOREADY) IVPB 2000 mg/400 mL        2,000 mg 200 mL/hr over 120 Minutes Intravenous  Once 11/02/22 0030 11/02/22 0305   2022/11/09 2315  piperacillin-tazobactam (ZOSYN) IVPB 3.375 g  Status:  Discontinued         3.375 g 12.5 mL/hr over 240 Minutes Intravenous Every 8 hours 2022/11/09 2306 10/24/2022 1459   11-09-2022 2315  clindamycin (CLEOCIN) IVPB 900 mg  Status:  Discontinued        900 mg 100 mL/hr over 30 Minutes Intravenous Every 8 hours November 09, 2022 2306 11/02/22 1222   11/09/2022 1930  vancomycin (VANCOCIN) IVPB 1000 mg/200 mL premix  Status:  Discontinued       See Hyperspace for full Linked Orders Report.   1,000 mg 200 mL/hr over 60 Minutes Intravenous  Once November 09, 2022 1823 11/02/22 0029  11/10/2022 1830  ceFEPIme (MAXIPIME) 2 g in sodium chloride 0.9 % 100 mL IVPB        2 g 200 mL/hr over 30 Minutes Intravenous  Once 11/04/2022 1819 10/26/2022 2016   10/30/2022 1830  metroNIDAZOLE (FLAGYL) IVPB 500 mg        500 mg 100 mL/hr over 60 Minutes Intravenous  Once 10/30/2022 1819 11/20/2022 2130   11/15/2022 1830  vancomycin (VANCOCIN) IVPB 1000 mg/200 mL premix  Status:  Discontinued        1,000 mg 200 mL/hr over 60 Minutes Intravenous  Once 10/27/2022 1819 11/04/2022 1823   11/22/2022 1830  vancomycin (VANCOCIN) IVPB 1000 mg/200 mL premix  Status:  Discontinued       See Hyperspace for full Linked Orders Report.   1,000 mg 200 mL/hr over 60 Minutes Intravenous  Once 10/27/2022 1823 11/02/22 0029        Medications  Scheduled Meds:  (feeding supplement) PROSource Plus  30 mL Oral TID BM   sodium chloride   Intravenous Once   sodium chloride   Intravenous Once   acetaminophen  650 mg Oral Q6H   acetaZOLAMIDE  500 mg Oral TID   alteplase  2 mg Intracatheter Once   amLODipine  10 mg Oral Daily   ascorbic acid  250 mg Oral Daily   Chlorhexidine Gluconate Cloth  6 each Topical Q0600   docusate sodium  100 mg Oral BID   famotidine  20 mg Oral BID   feeding supplement  237 mL Oral BID BM   ferrous sulfate  325 mg Oral BID WC   gabapentin  100 mg Oral TID   heparin  7,500 Units Subcutaneous Q8H   hydrALAZINE  25 mg Oral Q6H   insulin aspart  0-15 Units Subcutaneous TID AC & HS   insulin glargine-yfgn  20 Units  Subcutaneous Daily   levothyroxine  75 mcg Oral Q0600   nutrition supplement (JUVEN)  1 packet Oral BID BM   mouth rinse  15 mL Mouth Rinse 4 times per day   pravastatin  10 mg Oral q1800   QUEtiapine  75 mg Oral QHS   sodium chloride flush  10-40 mL Intracatheter Q12H   Zinc Oxide   Topical TID   Continuous Infusions: PRN Meds:.acetaminophen, calcium carbonate, hydrALAZINE, HYDROmorphone (DILAUDID) injection, labetalol, ondansetron, mouth rinse, mouth rinse, oxyCODONE, oxyCODONE, polyethylene glycol, senna-docusate, sodium chloride flush, sodium chloride flush    Subjective:   Alexyss Balzarini was seen and examined today.  In good spiritis. Pain is controlled. Slightly nauseated earlier.   Objective:   Vitals:   11/18/22 0736 11/18/22 0855 11/18/22 1300 11/18/22 1354  BP: 120/74  (!) 141/65 (!) 160/82  Pulse: 73 80 93 87  Resp:  (!) 22  (!) 22  Temp: 98.6 F (37 C)   97.7 F (36.5 C)  TempSrc: Oral   Oral  SpO2: 100% 100%  100%  Weight:      Height:        Intake/Output Summary (Last 24 hours) at 11/18/2022 1603 Last data filed at 11/18/2022 0018 Gross per 24 hour  Intake --  Output 3600 ml  Net -3600 ml    Filed Weights   11/09/22 0337 11/10/22 0343 11/14/22 0600  Weight: (!) 214 kg (!) 216 kg (!) 223.3 kg     Exam General exam: Appears calm and comfortable  Respiratory system: Clear to auscultation. Respiratory effort normal. Cardiovascular system: S1 & S2 heard, RRR. No JVD,  Gastrointestinal  system: Abdomen is nondistended, soft and nontender.  Central nervous system: Alert and oriented. No focal neurological deficits. Extremities: Symmetric 5 x 5 power. Skin: chronic right perineum wound.  Psychiatry: Mood & affect appropriate.       Data Reviewed:  I have personally reviewed following labs and imaging studies   CBC Lab Results  Component Value Date   WBC 8.5 11/18/2022   RBC 2.55 (L) 11/18/2022   HGB 7.5 (L) 11/18/2022   HCT 24.5 (L)  11/18/2022   MCV 96.1 11/18/2022   MCH 29.4 11/18/2022   PLT 260 11/18/2022   MCHC 30.6 11/18/2022   RDW 17.0 (H) 11/18/2022   LYMPHSABS 1.9 11/18/2022   MONOABS 0.8 11/18/2022   EOSABS 0.1 11/18/2022   BASOSABS 0.0 11/18/2022     Last metabolic panel Lab Results  Component Value Date   NA 138 11/18/2022   K 2.9 (L) 11/18/2022   CL 108 11/18/2022   CO2 23 11/18/2022   BUN 18 11/18/2022   CREATININE 1.05 (H) 11/18/2022   GLUCOSE 192 (H) 11/18/2022   GFRNONAA >60 11/18/2022   GFRAA >60 07/03/2016   CALCIUM 8.1 (L) 11/18/2022   PHOS 3.6 10/31/2022   PROT 6.6 11/14/2022   ALBUMIN 1.6 (L) 11/14/2022   BILITOT 0.5 11/14/2022   ALKPHOS 96 11/14/2022   AST 17 11/14/2022   ALT 20 11/14/2022   ANIONGAP 7 11/18/2022    CBG (last 3)  Recent Labs    11/18/22 0739 11/18/22 1157 11/18/22 1309  GLUCAP 188* 209* 198*       Coagulation Profile: No results for input(s): "INR", "PROTIME" in the last 168 hours.   Radiology Studies: No results found.     Kathlen Mody M.D. [facility] 11/18/2022, 4:03 PM  Available via Epic secure chat 7am-7pm After 7 pm, please refer to night coverage provider listed on amion.

## 2022-11-18 NOTE — Progress Notes (Signed)
Inpatient Diabetes Program Recommendations  AACE/ADA: New Consensus Statement on Inpatient Glycemic Control (2015)  Target Ranges:  Prepandial:   less than 140 mg/dL      Peak postprandial:   less than 180 mg/dL (1-2 hours)      Critically ill patients:  140 - 180 mg/dL   Lab Results  Component Value Date   GLUCAP 198 (H) 11/18/2022   HGBA1C 14.7 (H) 11/06/2022    Review of Glycemic Control  Latest Reference Range & Units 11/18/22 00:12 11/18/22 04:07 11/18/22 07:39 11/18/22 11:57 11/18/22 13:09  Glucose-Capillary 70 - 99 mg/dL 221 (H) 174 (H) 188 (H) 209 (H) 198 (H)   Diabetes history: DM2 Outpatient Diabetes medications: Actos 45 mg daily, Amaryl 2 mg daily Current orders for Inpatient glycemic control:  Semglee 20 units Daily Novolog 0-15 units tid Inpatient Diabetes Program Recommendations:    Spoke with patient again about glucose goals and her need for insulin.  She states that she was shown insulin pen but she's not an expert.  I asked RN to allow patient to self-administer insulin with each ordered insulin dose to allow her practice on giving injections.  Note that she will be D/C'ing to SNF for rehabilitation.  May benefit from the addition of a GLP-1 as well (Mounjaro, Ozempic, etc) to help with insulin resistance and possibly weight loss? Needs close f/u with PCP and possibly endocrinology.  Reminded patient that controlling blood sugars is important for wound healing as well. Will follow.  Thanks,  Adah Perl, RN, BC-ADM Inpatient Diabetes Coordinator Pager [phone]  (8a-5P)

## 2022-11-18 NOTE — NC FL2 (Signed)
[place] MEDICAID FL2 LEVEL OF CARE FORM     IDENTIFICATION  Patient Name: Lindsey [name] Birthdate: [birth year] Sex: female Admission Date (Current Location): Nov 22, 2022  [facility] and [place] Number:  Producer, television/film/video and Address:  [facility] [place], [address] [place], [place] [address]      Provider Number: [id number]  Attending Physician Name and Address:  Kathlen Mody, MD  Relative Name and Phone Number:  Staci Righter Mother [phone]    Current Level of Care: Hospital Recommended Level of Care: Skilled Nursing Facility Prior Approval Number:    Date Approved/Denied:   PASRR Number:    Discharge Plan: SNF    Current Diagnoses: Patient Active Problem List   Diagnosis Date Noted   AKI (acute kidney injury) (HCC) 11/14/2022   Fournier's gangrene in female [facility]) 2022/11/22   Atrial fibrillation with RVR (HCC) 11/22/22   Major depressive disorder, recurrent episode, severe (HCC) 02/14/2022   Panic disorder with agoraphobia 02/14/2022   Type 2 diabetes mellitus with other specified complication (HCC) 10/29/2021   Anxiety 10/29/2021   High cholesterol 10/29/2021   Hypertension 10/29/2021   Hypothyroidism 10/29/2021   Major depression in partial remission (HCC) 10/29/2021   Panic disorder 10/29/2021   Morbid obesity (HCC) 10/29/2021   Idiopathic intracranial hypertension 06/02/2015   Depression 02/22/2015   Pseudotumor cerebri 09/09/2011   Obesity, morbid (HCC) 09/09/2011   Vision disturbance 09/09/2011    Orientation RESPIRATION BLADDER Height & Weight     Self, Time, Situation, Place  Normal Incontinent, External catheter Weight: (!) 492 lb 4.6 oz (223.3 kg) Height:  5\' 6"  (167.6 cm)  BEHAVIORAL SYMPTOMS/MOOD NEUROLOGICAL BOWEL NUTRITION STATUS      Incontinent Diet (see discharge summary)  AMBULATORY STATUS COMMUNICATION OF NEEDS Skin   Total Care Verbally Other (Comment) (large wound:Perineal and right labial wound  communicating with buttock wound)                       Personal Care Assistance Level of Assistance  Bathing, Feeding, Dressing Bathing Assistance: Maximum assistance Feeding assistance: Maximum assistance Dressing Assistance: Maximum assistance     Functional Limitations Info  Sight, Hearing, Speech Sight Info: Adequate Hearing Info: Adequate Speech Info: Adequate    SPECIAL CARE FACTORS FREQUENCY  PT (By licensed PT), OT (By licensed OT)     PT Frequency: 5x week OT Frequency: 5x week            Contractures Contractures Info: Not present    Additional Factors Info  Code Status, Allergies, Insulin Sliding Scale Code Status Info: full Allergies Info: Bupropion, Anti-inflammatory Enzyme (Nutritional Supplements), Hydroxyzine, Metformin, Nsaids, Rosuvastatin   Insulin Sliding Scale Info: Novolog: see discharge summary       Current Medications (11/18/2022):  This is the current hospital active medication list Current Facility-Administered Medications  Medication Dose Route Frequency Provider Last Rate Last Admin   (feeding supplement) PROSource Plus liquid 30 mL  30 mL Oral TID BM 11/20/2022, MD   30 mL at 11/18/22 1400   0.9 %  sodium chloride infusion (Manually program via Guardrails IV Fluids)   Intravenous Once 11/20/22, MD       0.9 %  sodium chloride infusion (Manually program via Guardrails IV Fluids)   Intravenous Once Abigail Miyamoto, MD       acetaminophen (TYLENOL) tablet 650 mg  650 mg Oral Q6H Migdalia Dk, RPH   650 mg at 11/18/22 1300  acetaminophen (TYLENOL) tablet 650 mg  650 mg Oral Q6H PRN Kaleen Mask, RPH   650 mg at 11/15/22 1458   acetaZOLAMIDE (DIAMOX) tablet 500 mg  500 mg Oral TID Noemi Chapel P, DO   500 mg at 11/18/22 1040   alteplase (CATHFLO ACTIVASE) injection 2 mg  2 mg Intracatheter Once Gaylan Gerold, DO       amLODipine (NORVASC) tablet 10 mg  10 mg Oral Daily Kaleen Mask, RPH   10 mg at 11/18/22 1042    ascorbic acid (VITAMIN C) tablet 250 mg  250 mg Oral Daily Kaleen Mask, RPH   250 mg at 11/18/22 1038   calcium carbonate (TUMS - dosed in mg elemental calcium) chewable tablet 200 mg of elemental calcium  200 mg of elemental calcium Oral Q6H PRN Laqueta Jean, MD       Chlorhexidine Gluconate Cloth 2 % PADS 6 each  6 each Topical Q0600 Coralie Keens, MD   6 each at 11/18/22 6301   docusate sodium (COLACE) capsule 100 mg  100 mg Oral BID Jill Alexanders, PA-C   100 mg at 11/18/22 1038   famotidine (PEPCID) tablet 20 mg  20 mg Oral BID Laqueta Jean, MD   20 mg at 11/18/22 1038   feeding supplement (ENSURE ENLIVE / ENSURE PLUS) liquid 237 mL  237 mL Oral BID BM Hosie Poisson, MD   237 mL at 11/18/22 1400   ferrous sulfate tablet 325 mg  325 mg Oral BID WC Noemi Chapel P, DO   325 mg at 11/18/22 6010   gabapentin (NEURONTIN) capsule 100 mg  100 mg Oral TID Dwyane Dee, MD   100 mg at 11/18/22 1037   heparin injection 7,500 Units  7,500 Units Subcutaneous Q8H Gaylan Gerold, DO   7,500 Units at 11/18/22 1332   hydrALAZINE (APRESOLINE) injection 5-10 mg  5-10 mg Intravenous Q4H PRN Gaylan Gerold, DO   10 mg at 11/08/22 2001   hydrALAZINE (APRESOLINE) tablet 25 mg  25 mg Oral Q6H Kaleen Mask, RPH   25 mg at 11/18/22 1300   HYDROmorphone (DILAUDID) injection 0.5-1 mg  0.5-1 mg Intravenous Daily PRN Hosie Poisson, MD       insulin aspart (novoLOG) injection 0-15 Units  0-15 Units Subcutaneous TID AC & HS Dwyane Dee, MD   3 Units at 11/18/22 1317   insulin glargine-yfgn (SEMGLEE) injection 20 Units  20 Units Subcutaneous Daily Hosie Poisson, MD   20 Units at 11/18/22 1040   labetalol (NORMODYNE) injection 10 mg  10 mg Intravenous Q2H PRN Gaylan Gerold, DO   10 mg at 11/08/22 2018   levothyroxine (SYNTHROID) tablet 75 mcg  75 mcg Oral Q0600 Kaleen Mask, RPH   75 mcg at 11/18/22 9323   nutrition supplement (JUVEN) (JUVEN) powder packet 1 packet  1 packet Oral BID BM Hosie Poisson, MD   1 packet at 11/18/22 1400   ondansetron (ZOFRAN) tablet 4 mg  4 mg Oral Q8H PRN Hosie Poisson, MD       Oral care mouth rinse  15 mL Mouth Rinse 4 times per day Noemi Chapel P, DO   15 mL at 11/18/22 1300   Oral care mouth rinse  15 mL Mouth Rinse PRN Julian Hy, DO       Oral care mouth rinse  15 mL Mouth Rinse PRN Dwyane Dee, MD       oxyCODONE (Oxy IR/ROXICODONE) immediate release tablet 20  mg  20 mg Oral Daily PRN Meuth, Brooke A, PA-C   20 mg at 11/18/22 0945   oxyCODONE (Oxy IR/ROXICODONE) immediate release tablet 5-10 mg  5-10 mg Oral Q4H PRN Jill Alexanders, PA-C   10 mg at 11/16/22 8101   polyethylene glycol (MIRALAX / GLYCOLAX) packet 17 g  17 g Oral Daily PRN Kaleen Mask, RPH   17 g at 11/13/22 2124   pravastatin (PRAVACHOL) tablet 10 mg  10 mg Oral q1800 Noemi Chapel P, DO   10 mg at 11/17/22 1759   QUEtiapine (SEROQUEL) tablet 75 mg  75 mg Oral QHS Gaylan Gerold, DO   75 mg at 11/17/22 2134   senna-docusate (Senokot-S) tablet 1 tablet  1 tablet Oral QHS PRN Kaleen Mask, RPH       sodium chloride flush (NS) 0.9 % injection 10-40 mL  10-40 mL Intracatheter Q12H Coralie Keens, MD   10 mL at 11/18/22 1100   sodium chloride flush (NS) 0.9 % injection 10-40 mL  10-40 mL Intracatheter PRN Coralie Keens, MD       sodium chloride flush (NS) 0.9 % injection 10-40 mL  10-40 mL Intracatheter PRN Hosie Poisson, MD   10 mL at 11/18/22 0420   Zinc Oxide (TRIPLE PASTE) 12.8 % ointment   Topical TID Jill Alexanders, PA-C   Given at 11/18/22 1044     Discharge Medications: Please see discharge summary for a list of discharge medications.  Relevant Imaging Results:  Relevant Lab Results:   Additional Information SSN: [id number].  Pt is not vaccinated for covid  Thamara Leger, Veronia Beets, LCSW

## 2022-11-18 NOTE — TOC Initial Note (Signed)
Transition of Care [facility]) - Initial/Assessment Note    Patient Details  Name: Lindsey [name] MRN: [id number] Date of Birth: Dec 28, 1985  Transition of Care [facility]) CM/SW Contact:    Joanne Chars, LCSW Phone Number: 11/18/2022, 1:57 PM  Clinical Narrative:  CSW met with pt regarding DC recommendation for SNF.  Pt agreeable to this, no facility preference indicated.  Pt lives with brother, no current services.  Mother Santiago Glad is primary contact, permission given to speak with mother.        Pt is not vaccinated for covid.   Passr requires additional information.           Expected Discharge Plan: Skilled Nursing Facility Barriers to Discharge: Continued Medical Work up, SNF Pending bed offer, Other (must enter comment) (bariatric, uninsured)   Patient Goals and CMS Choice Patient states their goals for this hospitalization and ongoing recovery are:: learn more, manage my sugar          Expected Discharge Plan and Services In-house Referral: Clinical Social Work   Post Acute Care Choice: [place] Living arrangements for the past 2 months: [place]                                      Prior Living Arrangements/Services Living arrangements for the past 2 months: Single Family Home Lives with:: Siblings (lives with brother) Patient language and need for interpreter reviewed:: Yes Do you feel safe going back to the place where you live?: Yes      Need for Family Participation in Patient Care: Yes (Comment) Care giver support system in place?: Yes (comment) Current home services: Other (comment) (none) Criminal Activity/Legal Involvement Pertinent to Current Situation/Hospitalization: No - Comment as needed  Activities of Daily Living      Permission Sought/Granted Permission sought to share information with : Family Supports Permission granted to share information with : Yes, Verbal Permission Granted  Share Information with NAME: mother  Santiago Glad  Permission granted to share info w AGENCY: SNF        Emotional Assessment Appearance:: Appears stated age Attitude/Demeanor/Rapport: Engaged Affect (typically observed): Appropriate Orientation: : Oriented to Self, Oriented to Place, Oriented to  Time, Oriented to Situation      Admission diagnosis:  Fournier's gangrene in female [facility]) [N76.82] Atrial fibrillation with RVR ([place]) [I48.91] Severe sepsis ([place]) [A41.9, R65.20] Diabetic ketoacidosis without coma associated with type 2 diabetes mellitus ([place]) [E11.10] Hyperosmolar hyperglycemic state (HHS) ([place]) [E11.00] Patient Active Problem List   Diagnosis Date Noted   AKI (acute kidney injury) ([place]) 11/14/2022   Fournier's gangrene in female [facility]) 11-02-2022   Atrial fibrillation with RVR ([place]) 02-Nov-2022   Major depressive disorder, recurrent episode, severe ([place]) 02/14/2022   Panic disorder with agoraphobia 02/14/2022   Type 2 diabetes mellitus with other specified complication ([place]) 16/60/6004   Anxiety 10/29/2021   High cholesterol 10/29/2021   Hypertension 10/29/2021   Hypothyroidism 10/29/2021   Major depression in partial remission ([place]) 10/29/2021   Panic disorder 10/29/2021   Morbid obesity ([place]) 10/29/2021   Idiopathic intracranial hypertension 06/02/2015   Depression 02/22/2015   Pseudotumor cerebri 09/09/2011   Obesity, morbid ([place]) 09/09/2011   Vision disturbance 09/09/2011   PCP:  Lawerance Cruel, MD Pharmacy:   [facility] [place], [place] [facility] [address] [place] [address] [place] [address] Phone: [phone]  Fax: [place], [place] [address] Phone: [phone] Fax: [phone]  [id number] [place], [place] [address] Phone: [phone] Fax:  [phone]     Social Determinants of Health (SDOH) Social History: SDOH Screenings   Depression (PHQ2-9): High Risk (03/14/2022)  Tobacco Use: Medium Risk (11/16/2022)   SDOH Interventions:     Readmission Risk Interventions     No data to display

## 2022-11-18 NOTE — Progress Notes (Signed)
RE:  Lindsey [name]       Date of Birth:  1986/01/30     Date:  11/18/22        To Whom It May Concern:  Please be advised that the above-named patient will require a short-term nursing home stay - anticipated 30 days or less for rehabilitation and strengthening.  The plan is for return home.                 MD signature                Date

## 2022-11-18 NOTE — Progress Notes (Signed)
Occupational Therapy Treatment Patient Details Name: Lindsey [name] MRN: [id number] DOB: 09/13/1986 Today's Date: 11/18/2022   History of present illness 37 year old morbidly obese female who presented 11/01/22 with generalized weakness, shortness of breath and worsening buttock wounds for about a month. +afib with RVR; sepsis; to OR 1/10, November 30, 2022, 1/13, 1/15 for necrotizing fascitis; post-op remained on vent, sedated with propofol, levophed; extubated 1/19;  PMH-diabetes , morbid obesity, pseudotumor cerebri, vision disturbance, idiopathic intracranial hypertension, HTN, panic disorder with agoraphobia.   OT comments  Pt. Seen for skilled OT treatment session with PT.  Pt. With great motivation and verbalized wanting to get up and moving.  Making gains with bed mobility with max cues including hand over hand and demonstrational.  Pt. Able to achieve un supported sitting beginning first with b ue support, then one hand, and eventually no use of b ues.  Able to assist with some portions of repositioning for back to bed.  Heavy cues throughout with increased time to achieve or unable to achieve in some cases requiring more assistance for pt. Understanding.  Cont. With current poc. Encouraging pt. To tolerate upright as much as possible.     Recommendations for follow up therapy are one component of a multi-disciplinary discharge planning process, led by the attending physician.  Recommendations may be updated based on patient status, additional functional criteria and insurance authorization.    Follow Up Recommendations  Other (comment)     Assistance Recommended at Discharge Frequent or constant Supervision/Assistance  Patient can return home with the following      Equipment Recommendations       Recommendations for Other Services      Precautions / Restrictions Precautions Precautions: Fall Precaution Comments: Rectal Tube, watch O2 and HR Restrictions Weight Bearing Restrictions: No        Mobility Bed Mobility Overal bed mobility: Needs Assistance Bed Mobility: Rolling, Supine to Sit, Sit to Supine Rolling: Max assist   Supine to sit: +2 for physical assistance, HOB elevated, Max assist Sit to supine: Max assist, +2 for physical assistance   General bed mobility comments: Pt performed sitting in [place] bed in chair position bringing trunk up from elevated HOB with 2 person Max A 3x. Pt was unable to understand instruction to asssit for the first 2 repetitions for the 3rd repetition pt was able to assist pulling herself up using bed rails.    Transfers                         Balance Overall balance assessment: Needs assistance Sitting-balance support: Bilateral upper extremity supported, Feet unsupported Sitting balance-Leahy Scale: Zero Sitting balance - Comments: Pt was able to sit on [place] bed without back support with LE lowered wtih feet on foot plate initially at Max A +2 heavy assistance. pt was able to progress to sitting without UE support using the trunk to hold herself up.                                   ADL either performed or assessed with clinical judgement   ADL                                              Extremity/Trunk Assessment  Vision       Perception     Praxis      Cognition Arousal/Alertness: Awake/alert Behavior During Therapy: Flat affect Overall Cognitive Status: Impaired/Different from baseline Area of Impairment: Safety/judgement, Awareness, Problem solving, Following commands                 Orientation Level: Disoriented to, Time Current Attention Level: Focused Memory: Decreased short-term memory Following Commands: Follows one step commands inconsistently, Follows one step commands with increased time Safety/Judgement: Decreased awareness of deficits, Decreased awareness of safety Awareness: Intellectual Problem Solving: Slow processing,  Decreased initiation, Difficulty sequencing, Requires verbal cues, Requires tactile cues          Exercises      Shoulder Instructions       General Comments PT HR increased to 112 bpm during session and Spo2 reading was difficult due to movement. Pt did get short of breathe with minimal activity requiring rest breaks between activities.    Pertinent Vitals/ Pain       Pain Assessment Pain Assessment: No/denies pain  Home Living                                          Prior Functioning/Environment              Frequency  Min 2X/week        Progress Toward Goals  OT Goals(current goals can now be found in the care plan section)  Progress towards OT goals: Progressing toward goals     Plan Discharge plan remains appropriate    Co-evaluation    PT/OT/SLP Co-Evaluation/Treatment: Yes Reason for Co-Treatment: To address functional/ADL transfers;For patient/therapist safety   OT goals addressed during session: ADL's and self-care      AM-PAC OT "6 Clicks" Daily Activity     Outcome Measure                    End of Session    OT Visit Diagnosis: Muscle weakness (generalized) (M62.81)   Activity Tolerance Patient tolerated treatment well   Patient Left in bed;with call bell/phone within reach   Nurse Communication Other (comment) (rn made aware pt. feling nauseas, but pt. did report it subsided towards end of session.)        Time: 1610-9604 OT Time Calculation (min): 35 min  Charges: OT General Charges $OT Visit: 1 Visit OT Treatments $Therapeutic Activity: 8-22 mins  Sonia Baller, COTA/L Acute Rehabilitation [phone]   Clearnce Sorrel Lorraine-COTA/L 11/18/2022, 1:03 PM

## 2022-11-19 LAB — GLUCOSE, CAPILLARY
Glucose-Capillary: 150 mg/dL — ABNORMAL HIGH (ref 70–99)
Glucose-Capillary: 170 mg/dL — ABNORMAL HIGH (ref 70–99)
Glucose-Capillary: 167 mg/dL — ABNORMAL HIGH (ref 70–99)
Glucose-Capillary: 133 mg/dL — ABNORMAL HIGH (ref 70–99)
Glucose-Capillary: 150 mg/dL — ABNORMAL HIGH (ref 70–99)

## 2022-11-19 LAB — MAGNESIUM: Magnesium: 1.8 mg/dL (ref 1.7–2.4)

## 2022-11-19 LAB — BASIC METABOLIC PANEL
Anion gap: 8 (ref 5–15)
BUN: 16 mg/dL (ref 6–20)
CO2: 21 mmol/L — ABNORMAL LOW (ref 22–32)
Calcium: 8.1 mg/dL — ABNORMAL LOW (ref 8.9–10.3)
Chloride: 108 mmol/L (ref 98–111)
Creatinine, Ser: 1.05 mg/dL — ABNORMAL HIGH (ref 0.44–1.00)
GFR, Estimated: >60 mL/min (ref >60)
Glucose, Bld: 141 mg/dL — ABNORMAL HIGH (ref 70–99)
Potassium: 3.3 mmol/L — ABNORMAL LOW (ref 3.5–5.1)
Sodium: 137 mmol/L (ref 135–145)

## 2022-11-19 MED ORDER — POTASSIUM CHLORIDE CRYS ER 20 MEQ PO TBCR
40.0000 meq | EXTENDED_RELEASE_TABLET | Freq: Two times a day (BID) | ORAL | Status: AC
  Administered 2022-11-19 (×2): 40 meq via ORAL
  Filled 2022-11-19 (×2): qty 2

## 2022-11-19 NOTE — Progress Notes (Signed)
Patient refused BIPAP HS tonight. Patient states its too uncomfortable and she can not tolerate it.

## 2022-11-19 NOTE — Progress Notes (Signed)
[facility]                                                                               Lindsey [name], is a 37 y.o. female, DOB - June 22, 1986, IDP:[id number] Admit date - 2022/11/16    Outpatient Primary MD for the patient is Lawerance Cruel, MD  LOS - 18  days    Brief summary   37 year old lady with prior history of type 2 diabetes mellitus, morbid obesity, OHS, hypertension for thyroidism, PCOS, pseudotumor cerebri, depression and anxiety, PTSD presents with worsening right perineal and gluteal region wounds. CT of the abdomen and pelvis consistent with Fournier's gangrene involving the perineum on the right, medial and posteromedial aspects of the right gluteal region. Patient underwent debridement on multiple occasions and currently getting daily dressing changes with normal saline. Therapy evaluations recommending SNF.   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  And completed the course of Unasyn.  General surgery and wound care on board, appreciate their management. Daily dressing changes with normal saline. Her flexi seal appears to be loose and coming out several times a day as per RN .  Pain control and nutrition. WBC count normalized Patient was sedated after getting  Dilaudid hence decreased the dose from 2 mg to 0.5 mg as needed for dressing changes. She is participating with PT and recommendations for SNF.    Type 2 diabetes mellitus Uncontrolled with hypoglycemia and hyperglycemia Last A1c is 14.7 CBG (last 3)  Recent Labs    11/19/22 0742 11/19/22 1223 11/19/22 1738  GLUCAP 170* 167* 133*    His CBGs have been well under 200s so far continue with sliding scale insulin. Start  Semglee at 20 units daily. Add novolog 3 units tidac.  Continue  monitor CBGs No changes in medications    Acute kidney injury Probably from poor oral intake.  Appears to have resolved Creatinine at 1 today.    Hypokalemia:  Replaced. Recheck tonight.  Magnesium level wnl.    Acute respiratory failure in the setting of OHS BiPAP at night and nasal cannula oxygen to keep sats greater than 90%      Essential hypertension Blood pressure parameters are well controlled.  Patient currently is on amlodipine-hydralazine patient is on hydralazine 25 mg     Acute anemia of blood loss from debridement procedures Transfuse to keep hemoglobin greater than 7.  Hemoglobin this morning at 7.6    Hypernatremia from free water deficit Appears to have resolved, continue to monitor    Hypothyroidism Continue with levothyroxine    History of anxiety, depression, PTSD, panic disorder Resume home medication   Hyperlipidemia Continue with statin.  History of idiopathic intracranial hypertension Continue with Diamox 3 times daily   Hypokalemia  replaced, repeat levels are pending  Physical deconditioning Suspect not going to be a CIR candidate (but they are following) and body habitus precludes SNF unable to go to LTAC due to lack of insurance.  Therapy eval recommending  SNF.    RN Pressure Injury Documentation:    Malnutrition Type:  Nutrition Problem: Increased nutrient needs Etiology: wound healing   Malnutrition Characteristics:  Signs/Symptoms: estimated needs   Nutrition Interventions:  Interventions: Refer to RD note for recommendations  Estimated body mass index is 79.46 kg/m as calculated from the following:   Height as of this encounter: 5\' 6"  (1.676 m).   Weight as of this encounter: 223.3 kg.  Code Status: Full code DVT Prophylaxis:  heparin injection 7,500 Units Start: 11/11/22 1400 SCDs Start: 2022/11/20 2241   Level of Care: Level of care: Telemetry Surgical Family Communication: None at  bedside  Disposition Plan:     Remains inpatient appropriate: CIR versus SNF  Procedures:  S/p debridement of buttock NSTI  11-20-2022 Dr. 12/31/22  S/p excisional debridement buttock and perineal wound 11/06/2022 Dr. 01/02/23 S/p excisional debridement perineal wound 11/18/2022 Dr. 10/29/2022 S/P excisional debridement perineal wound and buttock wound 11/22/2022 Dr. 11/09/22  Consultants:   General surgery  Antimicrobials:   Anti-infectives (From admission, onward)    Start     Dose/Rate Route Frequency Ordered Stop   11/10/22 1600  Ampicillin-Sulbactam (UNASYN) 3 g in sodium chloride 0.9 % 100 mL IVPB        3 g 200 mL/hr over 30 Minutes Intravenous Every 6 hours 11/10/22 1438 11/13/22 2323   11/10/22 1400  Ampicillin-Sulbactam (UNASYN) 3 g in sodium chloride 0.9 % 100 mL IVPB  Status:  Discontinued        3 g 200 mL/hr over 30 Minutes Intravenous Every 6 hours 11/10/22 1113 11/10/22 1438   11/11/2022 1630  Ampicillin-Sulbactam (UNASYN) 3 g in sodium chloride 0.9 % 100 mL IVPB  Status:  Discontinued        3 g 200 mL/hr over 30 Minutes Intravenous Every 6 hours 11/06/2022 1051 11/10/22 1113   11/06/2022 1600  piperacillin-tazobactam (ZOSYN) IVPB 3.375 g  Status:  Discontinued        3.375 g 12.5 mL/hr over 240 Minutes Intravenous Every 8 hours 11/12/2022 1459 11/20/2022 1031   11/02/22 1400  linezolid (ZYVOX) IVPB 600 mg  Status:  Discontinued        600 mg 300 mL/hr over 60 Minutes Intravenous Every 12 hours 11/02/22 1222 11/04/22 0830   11/02/22 1000  vancomycin (VANCOREADY) IVPB 1250 mg/250 mL  Status:  Discontinued        1,250 mg 166.7 mL/hr over 90 Minutes Intravenous Every 12 hours 2022/11/20 2135 2022/11/20 2137   11/02/22 1000  vancomycin (VANCOREADY) IVPB 1500 mg/300 mL  Status:  Discontinued        1,500 mg 150 mL/hr over 120 Minutes Intravenous Every 12 hours 11/20/2022 2137 11/02/22 1222   11/02/22 0300  ceFEPIme (MAXIPIME) 2 g in sodium chloride 0.9 % 100 mL IVPB  Status:  Discontinued         2 g 200 mL/hr over 30 Minutes Intravenous Every 8 hours 2022-11-20 2135 11-20-22 2258   11/02/22 0045  vancomycin (VANCOREADY) IVPB 2000 mg/400 mL        2,000 mg 200 mL/hr over 120 Minutes Intravenous  Once 11/02/22 0030 11/02/22 0305   Nov 20, 2022 2315  piperacillin-tazobactam (ZOSYN) IVPB 3.375 g  Status:  Discontinued        3.375 g 12.5 mL/hr over 240 Minutes Intravenous Every 8 hours 11/20/22 2306 11/21/2022 1459   20-Nov-2022 2315  clindamycin (CLEOCIN) IVPB 900 mg  Status:  Discontinued        900 mg 100 mL/hr  over 30 Minutes Intravenous Every 8 hours 11-13-22 2306 11/02/22 1222   13-Nov-2022 1930  vancomycin (VANCOCIN) IVPB 1000 mg/200 mL premix  Status:  Discontinued       See Hyperspace for full Linked Orders Report.   1,000 mg 200 mL/hr over 60 Minutes Intravenous  Once 11-13-22 1823 11/02/22 0029   11-13-2022 1830  ceFEPIme (MAXIPIME) 2 g in sodium chloride 0.9 % 100 mL IVPB        2 g 200 mL/hr over 30 Minutes Intravenous  Once 11-13-22 1819 11-13-2022 2016   13-Nov-2022 1830  metroNIDAZOLE (FLAGYL) IVPB 500 mg        500 mg 100 mL/hr over 60 Minutes Intravenous  Once 2022-11-13 1819 November 13, 2022 2130   11-13-2022 1830  vancomycin (VANCOCIN) IVPB 1000 mg/200 mL premix  Status:  Discontinued        1,000 mg 200 mL/hr over 60 Minutes Intravenous  Once 2022/11/13 1819 11/13/2022 1823   11/13/22 1830  vancomycin (VANCOCIN) IVPB 1000 mg/200 mL premix  Status:  Discontinued       See Hyperspace for full Linked Orders Report.   1,000 mg 200 mL/hr over 60 Minutes Intravenous  Once 11/13/2022 1823 11/02/22 0029        Medications  Scheduled Meds:  (feeding supplement) PROSource Plus  30 mL Oral TID BM   sodium chloride   Intravenous Once   sodium chloride   Intravenous Once   acetaminophen  650 mg Oral Q6H   acetaZOLAMIDE  500 mg Oral TID   alteplase  2 mg Intracatheter Once   amLODipine  10 mg Oral Daily   ascorbic acid  250 mg Oral Daily   Chlorhexidine Gluconate Cloth  6 each Topical Q0600    docusate sodium  100 mg Oral BID   famotidine  20 mg Oral BID   feeding supplement  237 mL Oral BID BM   ferrous sulfate  325 mg Oral BID WC   gabapentin  100 mg Oral TID   heparin  7,500 Units Subcutaneous Q8H   hydrALAZINE  25 mg Oral Q6H   insulin aspart  0-15 Units Subcutaneous TID AC & HS   insulin aspart  3 Units Subcutaneous TID WC   insulin glargine-yfgn  20 Units Subcutaneous Daily   levothyroxine  75 mcg Oral Q0600   nutrition supplement (JUVEN)  1 packet Oral BID BM   mouth rinse  15 mL Mouth Rinse 4 times per day   potassium chloride  40 mEq Oral BID   pravastatin  10 mg Oral q1800   QUEtiapine  75 mg Oral QHS   sodium chloride flush  10-40 mL Intracatheter Q12H   Zinc Oxide   Topical TID   Continuous Infusions: PRN Meds:.acetaminophen, calcium carbonate, hydrALAZINE, HYDROmorphone (DILAUDID) injection, labetalol, ondansetron, mouth rinse, mouth rinse, oxyCODONE, oxyCODONE, polyethylene glycol, senna-docusate, sodium chloride flush, sodium chloride flush    Subjective:   Lindsey [name] was seen and examined today.  No new complaints today, no nausea or vomiting or abdominal pain  Objective:   Vitals:   11/18/22 2246 11/18/22 2342 11/19/22 0402 11/19/22 1605  BP:  (!) 146/84 (!) 158/78 (!) 107/94  Pulse:   87 (!) 104  Resp:    17  Temp:  98.7 F (37.1 C) 99 F (37.2 C) 98.8 F (37.1 C)  TempSrc:  Oral Axillary Oral  SpO2: (!) 64%  100% 96%  Weight:      Height:        Intake/Output Summary (  Last 24 hours) at 11/19/2022 1824 Last data filed at 11/19/2022 1500 Gross per 24 hour  Intake 240 ml  Output 2000 ml  Net -1760 ml    Filed Weights   11/09/22 0337 11/10/22 0343 11/14/22 0600  Weight: (!) 214 kg (!) 216 kg (!) 223.3 kg     Exam General exam: Young obese lady not in any kind of distress Respiratory system: Clear to auscultation. Respiratory effort normal. Cardiovascular system: S1 & S2 heard, RRR. No JVD,  Gastrointestinal system: Abdomen is  nondistended, soft and nontender.. Central nervous system: Alert and oriented.  Grossly non focal Extremities: No cyanosis Skin: RIGHT gluteal area gangrene Psychiatry:  Mood & affect appropriate.      Data Reviewed:  I have personally reviewed following labs and imaging studies   CBC Lab Results  Component Value Date   WBC 9.6 11/18/2022   RBC 2.62 (L) 11/18/2022   HGB 7.6 (L) 11/18/2022   HCT 25.6 (L) 11/18/2022   MCV 97.7 11/18/2022   MCH 29.0 11/18/2022   PLT 257 11/18/2022   MCHC 29.7 (L) 11/18/2022   RDW 16.9 (H) 11/18/2022   LYMPHSABS 2.0 11/18/2022   MONOABS 1.0 11/18/2022   EOSABS 0.1 11/18/2022   BASOSABS 0.0 68/34/1962     Last metabolic panel Lab Results  Component Value Date   NA 138 11/18/2022   K 3.1 (L) 11/18/2022   CL 108 11/18/2022   CO2 23 11/18/2022   BUN 18 11/18/2022   CREATININE 1.05 (H) 11/18/2022   GLUCOSE 192 (H) 11/18/2022   GFRNONAA >60 11/18/2022   GFRAA >60 07/03/2016   CALCIUM 8.1 (L) 11/18/2022   PHOS 3.6 11/12/2022   PROT 6.6 11/14/2022   ALBUMIN 1.6 (L) 11/14/2022   BILITOT 0.5 11/14/2022   ALKPHOS 96 11/14/2022   AST 17 11/14/2022   ALT 20 11/14/2022   ANIONGAP 7 11/18/2022    CBG (last 3)  Recent Labs    11/19/22 0742 11/19/22 1223 11/19/22 1738  GLUCAP 170* 167* 133*       Coagulation Profile: No results for input(s): "INR", "PROTIME" in the last 168 hours.   Radiology Studies: No results found.     Hosie Poisson M.D. [facility] 11/19/2022, 6:24 PM  Available via Epic secure chat 7am-7pm After 7 pm, please refer to night coverage provider listed on amion.

## 2022-11-20 LAB — GLUCOSE, CAPILLARY
Glucose-Capillary: 154 mg/dL — ABNORMAL HIGH (ref 70–99)
Glucose-Capillary: 190 mg/dL — ABNORMAL HIGH (ref 70–99)
Glucose-Capillary: 194 mg/dL — ABNORMAL HIGH (ref 70–99)
Glucose-Capillary: 204 mg/dL — ABNORMAL HIGH (ref 70–99)
Glucose-Capillary: 217 mg/dL — ABNORMAL HIGH (ref 70–99)
Glucose-Capillary: 228 mg/dL — ABNORMAL HIGH (ref 70–99)

## 2022-11-20 LAB — BASIC METABOLIC PANEL
Anion gap: 10 (ref 5–15)
BUN: 17 mg/dL (ref 6–20)
CO2: 19 mmol/L — ABNORMAL LOW (ref 22–32)
Calcium: 8.1 mg/dL — ABNORMAL LOW (ref 8.9–10.3)
Chloride: 110 mmol/L (ref 98–111)
Creatinine, Ser: 1.21 mg/dL — ABNORMAL HIGH (ref 0.44–1.00)
GFR, Estimated: 60 mL/min — ABNORMAL LOW (ref >60)
Glucose, Bld: 188 mg/dL — ABNORMAL HIGH (ref 70–99)
Potassium: 3.5 mmol/L (ref 3.5–5.1)
Sodium: 139 mmol/L (ref 135–145)

## 2022-11-20 MED ORDER — INSULIN GLARGINE-YFGN 100 UNIT/ML ~~LOC~~ SOLN
25.0000 [IU] | Freq: Every day | SUBCUTANEOUS | Status: DC
  Administered 2022-11-21 – 2022-11-22 (×2): 25 [IU] via SUBCUTANEOUS
  Filled 2022-11-20 (×2): qty 0.25

## 2022-11-20 MED ORDER — INSULIN ASPART 100 UNIT/ML IJ SOLN
4.0000 [IU] | Freq: Three times a day (TID) | INTRAMUSCULAR | Status: DC
  Administered 2022-11-20 – 2022-11-25 (×12): 4 [IU] via SUBCUTANEOUS

## 2022-11-20 NOTE — Progress Notes (Signed)
Occupational Therapy Treatment Patient Details Name: Lindsey [name] MRN: [id number] DOB: 08/14/1986 Today's Date: 11/20/2022   History of present illness 37 year old morbidly obese female who presented 10/29/2022 with generalized weakness, shortness of breath and worsening buttock wounds for about a month. +afib with RVR; sepsis; to OR 1/10, 1/11, 1/13, 1/15 for necrotizing fascitis; post-op remained on vent, sedated with propofol, levophed; extubated 1/19;  PMH-diabetes , morbid obesity, pseudotumor cerebri, vision disturbance, idiopathic intracranial hypertension, HTN, panic disorder with agoraphobia.   OT comments  Patient placed in chair position in [place] bed and patient was able to sit up with one extremity support and perform grooming and reaching tasks. Patient quickly fatigues and placed back to supine. Patient performed rolling in bed to address positioning and comfort. Recommend SNF for discharge recommendation for continued OT to address bed mobility, functional transfers, and self care. Acute OT to continue to follow.    Recommendations for follow up therapy are one component of a multi-disciplinary discharge planning process, led by the attending physician.  Recommendations may be updated based on patient status, additional functional criteria and insurance authorization.    Follow Up Recommendations  Skilled nursing-short term rehab (<3 hours/day)     Assistance Recommended at Discharge Frequent or constant Supervision/Assistance  Patient can return home with the following  Help with stairs or ramp for entrance;Assist for transportation;Assistance with cooking/housework;Two people to help with walking and/or transfers;Two people to help with bathing/dressing/bathroom   Equipment Recommendations  Wheelchair (measurements OT);Wheelchair cushion (measurements OT)    Recommendations for Other Services      Precautions / Restrictions Precautions Precautions: Fall Precaution Comments:  Rectal Tube, watch O2 and HR       Mobility Bed Mobility Overal bed mobility: Needs Assistance Bed Mobility: Rolling, Supine to Sit, Sit to Supine Rolling: Mod assist   Supine to sit: Max assist Sit to supine: Max assist   General bed mobility comments: bed placed in chair position with patient coming off back of bed and supporting self with LUE.  Rolling performed to address bed positioning and comfort    Transfers                         Balance Overall balance assessment: Needs assistance Sitting-balance support: Single extremity supported, Bilateral upper extremity supported, Feet supported Sitting balance-Leahy Scale: Zero (progressing to poor) Sitting balance - Comments: able to tolerate sitting with bed in chair position without back support with one extremity support Postural control: Left lateral lean                                 ADL either performed or assessed with clinical judgement   ADL Overall ADL's : Needs assistance/impaired     Grooming: Wash/dry hands;Wash/dry face;Oral care;Supervision/safety;Sitting Grooming Details (indicate cue type and reason): performed seated in chiar position with no back support and one extremity support                               General ADL Comments: performd grooming seated up in bed with bed in chair position, quickly became fatigued after completing.    Extremity/Trunk Assessment              Vision       Perception     Praxis      Cognition Arousal/Alertness: Awake/alert Behavior During Therapy:  Flat affect Overall Cognitive Status: Impaired/Different from baseline Area of Impairment: Safety/judgement, Awareness, Problem solving, Following commands                 Orientation Level: Disoriented to, Time Current Attention Level: Focused Memory: Decreased short-term memory Following Commands: Follows one step commands inconsistently, Follows one step commands  with increased time Safety/Judgement: Decreased awareness of deficits, Decreased awareness of safety Awareness: Intellectual Problem Solving: Slow processing, Decreased initiation, Difficulty sequencing, Requires verbal cues, Requires tactile cues General Comments: unaware of deficits        Exercises      Shoulder Instructions       General Comments on 2.5 liters O2 with SpO2 98%, HR 74    Pertinent Vitals/ Pain       Pain Assessment Pain Assessment: Faces Faces Pain Scale: Hurts a little bit Pain Location: generalized while sitting up Pain Descriptors / Indicators: Discomfort, Grimacing Pain Intervention(s): Monitored during session, Repositioned  Home Living                                          Prior Functioning/Environment              Frequency  Min 2X/week        Progress Toward Goals  OT Goals(current goals can now be found in the care plan section)  Progress towards OT goals: Progressing toward goals  Acute Rehab OT Goals Patient Stated Goal: walk again Time For Goal Achievement: 11/28/22 Potential to Achieve Goals: Good ADL Goals Pt Will Perform Grooming: with set-up;sitting Pt Will Perform Upper Body Bathing: with min assist;sitting Pt Will Perform Upper Body Dressing: with min assist;sitting Pt/caregiver will Perform Home Exercise Program: Increased strength;Both right and left upper extremity;With Supervision Additional ADL Goal #1: Roll side to side with use of side rails and Mod A to increase ind with toileting. Additional ADL Goal #2: Supine to sit with Mod A of 2 for safety to increase ind with upper body ADL  Plan Discharge plan remains appropriate    Co-evaluation                 AM-PAC OT "6 Clicks" Daily Activity     Outcome Measure   Help from another person eating meals?: A Little Help from another person taking care of personal grooming?: A Little Help from another person toileting, which includes  using toliet, bedpan, or urinal?: Total Help from another person bathing (including washing, rinsing, drying)?: A Lot Help from another person to put on and taking off regular upper body clothing?: A Lot Help from another person to put on and taking off regular lower body clothing?: Total 6 Click Score: 12    End of Session Equipment Utilized During Treatment: Oxygen  OT Visit Diagnosis: Muscle weakness (generalized) (M62.81)   Activity Tolerance Patient tolerated treatment well   Patient Left in bed;with call bell/phone within reach   Nurse Communication Mobility status        Time: 8546-2703 OT Time Calculation (min): 28 min  Charges: OT General Charges $OT Visit: 1 Visit OT Treatments $Self Care/Home Management : 8-22 mins $Therapeutic Activity: 8-22 mins  Lodema Hong, [place]  Office [phone]   Trixie Dredge 11/20/2022, 12:18 PM

## 2022-11-20 NOTE — Progress Notes (Signed)
[facility]                                                                               Lindsey [name], is a 37 y.o. female, DOB - 1986/09/18, BPZ:[id number] Admit date - 11/23/2022    Outpatient Primary MD for the patient is Lindsey Floro, MD  LOS - 19  days    Brief summary   37 year old lady with prior history of type 2 diabetes mellitus, morbid obesity, OHS, hypertension for thyroidism, PCOS, pseudotumor cerebri, depression and anxiety, PTSD presents with worsening right perineal and gluteal region wounds. CT of the abdomen and pelvis consistent with Fournier's gangrene involving the perineum on the right, medial and posteromedial aspects of the right gluteal region. Patient underwent debridement on multiple occasions and currently getting daily dressing changes with normal saline. Therapy evaluations recommending SNF.   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  She  completed the course of Unasyn.  General surgery and wound care on board, appreciate their management. Daily dressing changes with normal saline. Her flexi seal appears to be loose and coming out several times a day as per RN .  Pain control and nutrition. WBC count normalized. Patient was sedated after getting  Dilaudid hence decreased the dose from 2 mg to 0.5 mg as needed for dressing changes. She is participating with PT and recommendations for SNF.    Type 2 diabetes mellitus Uncontrolled with hypoglycemia and hyperglycemia Last A1c is 14.7 CBG (last 3)  Recent Labs    11/20/22 0357 11/20/22 0809 11/20/22 1159  GLUCAP 194* 190* 217*    His CBGs have been well under 200s so far continue with sliding scale insulin. Increase Semglee to 25 units daily and increase novolog 4 units TIDAC.   Continue monitor CBGs     Acute kidney injury Probably from poor oral intake.  Creatinine fluctuating between 1.to 1.2.    Hypokalemia:  Replaced.    Acute respiratory failure in the setting of OHS BiPAP at night and nasal cannula oxygen to keep sats greater than 90%      Essential hypertension Blood pressure parameters are slightly elevated.  Patient currently is on amlodipine-hydralazine patient is on hydralazine 25 mg. Continue the same.      Acute anemia of blood loss from debridement procedures Transfuse to keep hemoglobin greater than 7.  Hemoglobin this morning at 7.6    Hypernatremia from free water deficit Appears to have resolved, continue to monitor    Hypothyroidism Continue with levothyroxine    History of anxiety, depression, PTSD, panic disorder Resume home medication   Hyperlipidemia Continue with statin.  History of idiopathic intracranial hypertension Continue with Diamox 3 times daily    Physical deconditioning Suspect not going to be a CIR candidate (but they are following) and body habitus precludes SNF unable to go to LTAC due to lack of insurance.  Therapy eval recommending SNF.    RN Pressure Injury Documentation:    Malnutrition Type:  Nutrition Problem: Increased nutrient needs Etiology: wound healing   Malnutrition Characteristics:  Signs/Symptoms: estimated needs   Nutrition Interventions:  Interventions: Refer to RD note for recommendations  Estimated body mass index is 79.46 kg/m as calculated from the following:   Height as of this encounter: 5\' 6"  (1.676 m).   Weight as of this encounter: 223.3 kg.  Code Status: Full code DVT Prophylaxis:  heparin injection 7,500 Units Start: 11/11/22 1400 SCDs Start: 11/02/2022 2241   Level of Care: Level of care: Telemetry Surgical Family Communication: None at bedside  Disposition Plan:     Remains inpatient appropriate: CIR versus SNF  Procedures:  S/p  debridement of buttock NSTI  11/11/2022 Dr. Rosendo Gros  S/p excisional debridement buttock and perineal wound 11/02/2022 Dr. Donne Hazel S/p excisional debridement perineal wound 2022/11/23 Dr. Rosendo Gros S/P excisional debridement perineal wound and buttock wound 11/15/2022 Dr. Ninfa Linden  Consultants:   General surgery  Antimicrobials:   Anti-infectives (From admission, onward)    Start     Dose/Rate Route Frequency Ordered Stop   11/10/22 1600  Ampicillin-Sulbactam (UNASYN) 3 g in sodium chloride 0.9 % 100 mL IVPB        3 g 200 mL/hr over 30 Minutes Intravenous Every 6 hours 11/10/22 1438 11/13/22 2323   11/10/22 1400  Ampicillin-Sulbactam (UNASYN) 3 g in sodium chloride 0.9 % 100 mL IVPB  Status:  Discontinued        3 g 200 mL/hr over 30 Minutes Intravenous Every 6 hours 11/10/22 1113 11/10/22 1438   10/24/2022 1630  Ampicillin-Sulbactam (UNASYN) 3 g in sodium chloride 0.9 % 100 mL IVPB  Status:  Discontinued        3 g 200 mL/hr over 30 Minutes Intravenous Every 6 hours 11/10/2022 1051 11/10/22 1113   11/04/2022 1600  piperacillin-tazobactam (ZOSYN) IVPB 3.375 g  Status:  Discontinued        3.375 g 12.5 mL/hr over 240 Minutes Intravenous Every 8 hours 10/24/2022 1459 11/06/2022 1031   11/02/22 1400  linezolid (ZYVOX) IVPB 600 mg  Status:  Discontinued        600 mg 300 mL/hr over 60 Minutes Intravenous Every 12 hours 11/02/22 1222 11/04/22 0830   11/02/22 1000  vancomycin (VANCOREADY) IVPB 1250 mg/250 mL  Status:  Discontinued        1,250 mg 166.7 mL/hr over 90 Minutes Intravenous Every 12 hours 11/15/2022 2135 10/29/2022 2137   11/02/22 1000  vancomycin (VANCOREADY) IVPB 1500 mg/300 mL  Status:  Discontinued        1,500 mg 150 mL/hr over 120 Minutes Intravenous Every 12 hours 10/27/2022 2137 11/02/22 1222   11/02/22 0300  ceFEPIme (MAXIPIME) 2 g in sodium chloride 0.9 % 100 mL IVPB  Status:  Discontinued        2 g 200 mL/hr over 30 Minutes Intravenous Every 8 hours 10/26/2022 2135 10/27/2022 2258   11/02/22  0045  vancomycin (VANCOREADY) IVPB 2000 mg/400 mL        2,000 mg 200 mL/hr over 120 Minutes Intravenous  Once 11/02/22 0030 11/02/22 0305   11/18/2022 2315  piperacillin-tazobactam (ZOSYN) IVPB 3.375 g  Status:  Discontinued        3.375 g 12.5 mL/hr over 240 Minutes Intravenous Every 8 hours 11/22/2022 2306 11/08/2022 1459   11/18/2022 2315  clindamycin (CLEOCIN) IVPB 900 mg  Status:  Discontinued        900 mg 100 mL/hr over 30 Minutes Intravenous Every 8 hours 11/02/2022 2306 11/02/22 1222   11/04/2022  1930  vancomycin (VANCOCIN) IVPB 1000 mg/200 mL premix  Status:  Discontinued       See Hyperspace for full Linked Orders Report.   1,000 mg 200 mL/hr over 60 Minutes Intravenous  Once 11/19/2022 1823 11/02/22 0029   11/15/2022 1830  ceFEPIme (MAXIPIME) 2 g in sodium chloride 0.9 % 100 mL IVPB        2 g 200 mL/hr over 30 Minutes Intravenous  Once 10/25/2022 1819 10/26/2022 2016   10/29/2022 1830  metroNIDAZOLE (FLAGYL) IVPB 500 mg        500 mg 100 mL/hr over 60 Minutes Intravenous  Once 10/31/2022 1819 10/30/2022 2130   10/29/2022 1830  vancomycin (VANCOCIN) IVPB 1000 mg/200 mL premix  Status:  Discontinued        1,000 mg 200 mL/hr over 60 Minutes Intravenous  Once 11/04/2022 1819 11/23/2022 1823   11/21/2022 1830  vancomycin (VANCOCIN) IVPB 1000 mg/200 mL premix  Status:  Discontinued       See Hyperspace for full Linked Orders Report.   1,000 mg 200 mL/hr over 60 Minutes Intravenous  Once 10/29/2022 1823 11/02/22 0029        Medications  Scheduled Meds:  (feeding supplement) PROSource Plus  30 mL Oral TID BM   sodium chloride   Intravenous Once   sodium chloride   Intravenous Once   acetaminophen  650 mg Oral Q6H   acetaZOLAMIDE  500 mg Oral TID   alteplase  2 mg Intracatheter Once   amLODipine  10 mg Oral Daily   ascorbic acid  250 mg Oral Daily   Chlorhexidine Gluconate Cloth  6 each Topical Q0600   docusate sodium  100 mg Oral BID   famotidine  20 mg Oral BID   feeding supplement  237 mL Oral BID  BM   ferrous sulfate  325 mg Oral BID WC   gabapentin  100 mg Oral TID   heparin  7,500 Units Subcutaneous Q8H   hydrALAZINE  25 mg Oral Q6H   insulin aspart  0-15 Units Subcutaneous TID AC & HS   insulin aspart  3 Units Subcutaneous TID WC   insulin glargine-yfgn  20 Units Subcutaneous Daily   levothyroxine  75 mcg Oral Q0600   nutrition supplement (JUVEN)  1 packet Oral BID BM   mouth rinse  15 mL Mouth Rinse 4 times per day   pravastatin  10 mg Oral q1800   QUEtiapine  75 mg Oral QHS   sodium chloride flush  10-40 mL Intracatheter Q12H   Zinc Oxide   Topical TID   Continuous Infusions: PRN Meds:.acetaminophen, calcium carbonate, hydrALAZINE, HYDROmorphone (DILAUDID) injection, labetalol, ondansetron, mouth rinse, mouth rinse, oxyCODONE, oxyCODONE, polyethylene glycol, senna-docusate, sodium chloride flush, sodium chloride flush    Subjective:   Azizah Lisle was seen and examined today. No new complaints.  Objective:   Vitals:   11/20/22 0402 11/20/22 0537 11/20/22 0808 11/20/22 1226  BP: 139/68 138/71 (!) 149/85 (!) 156/81  Pulse: 91  (!) 107   Resp: 20     Temp: 98.2 F (36.8 C)  97.6 F (36.4 C)   TempSrc: Oral  Oral   SpO2: 98%  100%   Weight:      Height:        Intake/Output Summary (Last 24 hours) at 11/20/2022 1454 Last data filed at 11/20/2022 1012 Gross per 24 hour  Intake 1220 ml  Output 4050 ml  Net -2830 ml    Filed Weights   11/09/22 2993  11/10/22 0343 11/14/22 0600  Weight: (!) 214 kg (!) 216 kg (!) 223.3 kg     Exam General exam: morbidly obese lady not in distress.  Respiratory system: Clear to auscultation. Respiratory effort normal. Cardiovascular system: S1 & S2 heard, RRR. No JVD, murmurs, Gastrointestinal system: Abdomen is nondistended, soft and nontender. Central nervous system: Alert and oriented. No focal neurological deficits. Extremities: Symmetric 5 x 5 power. Skin: right Gluteal ulceration.  Psychiatry:  Mood & affect  appropriate.       Data Reviewed:  I have personally reviewed following labs and imaging studies   CBC Lab Results  Component Value Date   WBC 9.6 11/18/2022   RBC 2.62 (L) 11/18/2022   HGB 7.6 (L) 11/18/2022   HCT 25.6 (L) 11/18/2022   MCV 97.7 11/18/2022   MCH 29.0 11/18/2022   PLT 257 11/18/2022   MCHC 29.7 (L) 11/18/2022   RDW 16.9 (H) 11/18/2022   LYMPHSABS 2.0 11/18/2022   MONOABS 1.0 11/18/2022   EOSABS 0.1 11/18/2022   BASOSABS 0.0 00/17/4944     Last metabolic panel Lab Results  Component Value Date   NA 139 11/20/2022   K 3.5 11/20/2022   CL 110 11/20/2022   CO2 19 (L) 11/20/2022   BUN 17 11/20/2022   CREATININE 1.21 (H) 11/20/2022   GLUCOSE 188 (H) 11/20/2022   GFRNONAA 60 (L) 11/20/2022   GFRAA >60 07/03/2016   CALCIUM 8.1 (L) 11/20/2022   PHOS 3.6 11/20/2022   PROT 6.6 11/14/2022   ALBUMIN 1.6 (L) 11/14/2022   BILITOT 0.5 11/14/2022   ALKPHOS 96 11/14/2022   AST 17 11/14/2022   ALT 20 11/14/2022   ANIONGAP 10 11/20/2022    CBG (last 3)  Recent Labs    11/20/22 0357 11/20/22 0809 11/20/22 1159  GLUCAP 194* 190* 217*       Coagulation Profile: No results for input(s): "INR", "PROTIME" in the last 168 hours.   Radiology Studies: No results found.     Hosie Poisson M.D. [facility] 11/20/2022, 2:54 PM  Available via Epic secure chat 7am-7pm After 7 pm, please refer to night coverage provider listed on amion.

## 2022-11-21 ENCOUNTER — Inpatient Hospital Stay (HOSPITAL_COMMUNITY): Payer: Medicaid Other

## 2022-11-21 DIAGNOSIS — R0609 Other forms of dyspnea: Secondary | ICD-10-CM

## 2022-11-21 DIAGNOSIS — G932 Benign intracranial hypertension: Secondary | ICD-10-CM

## 2022-11-21 LAB — ECHOCARDIOGRAM COMPLETE
Area-P 1/2: 4.29 cm2
Height: 66 in
S' Lateral: 3.7 cm
Weight: 7382.76 oz

## 2022-11-21 LAB — MAGNESIUM: Magnesium: 2 mg/dL (ref 1.7–2.4)

## 2022-11-21 LAB — CBC WITH DIFFERENTIAL/PLATELET
Abs Immature Granulocytes: 0.03 10*3/uL (ref 0.00–0.07)
Basophils Absolute: 0 10*3/uL (ref 0.0–0.1)
Basophils Relative: 0 %
Eosinophils Absolute: 0.3 10*3/uL (ref 0.0–0.5)
Eosinophils Relative: 3 %
HCT: 24.9 % — ABNORMAL LOW (ref 36.0–46.0)
Hemoglobin: 7.5 g/dL — ABNORMAL LOW (ref 12.0–15.0)
Immature Granulocytes: 0 %
Lymphocytes Relative: 22 %
Lymphs Abs: 1.7 10*3/uL (ref 0.7–4.0)
MCH: 29.2 pg (ref 26.0–34.0)
MCHC: 30.1 g/dL (ref 30.0–36.0)
MCV: 96.9 fL (ref 80.0–100.0)
Monocytes Absolute: 1.1 10*3/uL — ABNORMAL HIGH (ref 0.1–1.0)
Monocytes Relative: 14 %
Neutro Abs: 4.6 10*3/uL (ref 1.7–7.7)
Neutrophils Relative %: 61 %
Platelets: 271 10*3/uL (ref 150–400)
RBC: 2.57 MIL/uL — ABNORMAL LOW (ref 3.87–5.11)
RDW: 17.1 % — ABNORMAL HIGH (ref 11.5–15.5)
Smear Review: ADEQUATE
WBC: 7.7 10*3/uL (ref 4.0–10.5)
nRBC: 0.4 % — ABNORMAL HIGH (ref 0.0–0.2)

## 2022-11-21 LAB — BASIC METABOLIC PANEL
Anion gap: 8 (ref 5–15)
BUN: 20 mg/dL (ref 6–20)
CO2: 21 mmol/L — ABNORMAL LOW (ref 22–32)
Calcium: 8.1 mg/dL — ABNORMAL LOW (ref 8.9–10.3)
Chloride: 108 mmol/L (ref 98–111)
Creatinine, Ser: 1.33 mg/dL — ABNORMAL HIGH (ref 0.44–1.00)
GFR, Estimated: 53 mL/min — ABNORMAL LOW (ref >60)
Glucose, Bld: 188 mg/dL — ABNORMAL HIGH (ref 70–99)
Potassium: 3 mmol/L — ABNORMAL LOW (ref 3.5–5.1)
Sodium: 137 mmol/L (ref 135–145)

## 2022-11-21 LAB — BLOOD GAS, ARTERIAL
Acid-base deficit: 6.2 mmol/L — ABNORMAL HIGH (ref 0.0–2.0)
Bicarbonate: 19 mmol/L — ABNORMAL LOW (ref 20.0–28.0)
O2 Saturation: 97.8 %
Patient temperature: 37
pCO2 arterial: 36 mmHg (ref 32–48)
pH, Arterial: 7.33 — ABNORMAL LOW (ref 7.35–7.45)
pO2, Arterial: 79 mmHg — ABNORMAL LOW (ref 83–108)

## 2022-11-21 LAB — GLUCOSE, CAPILLARY
Glucose-Capillary: 174 mg/dL — ABNORMAL HIGH (ref 70–99)
Glucose-Capillary: 173 mg/dL — ABNORMAL HIGH (ref 70–99)
Glucose-Capillary: 215 mg/dL — ABNORMAL HIGH (ref 70–99)
Glucose-Capillary: 178 mg/dL — ABNORMAL HIGH (ref 70–99)
Glucose-Capillary: 192 mg/dL — ABNORMAL HIGH (ref 70–99)
Glucose-Capillary: 247 mg/dL — ABNORMAL HIGH (ref 70–99)

## 2022-11-21 LAB — BRAIN NATRIURETIC PEPTIDE: B Natriuretic Peptide: 121.1 pg/mL — ABNORMAL HIGH (ref 0.0–100.0)

## 2022-11-21 MED ORDER — METHOCARBAMOL 500 MG PO TABS
1000.0000 mg | ORAL_TABLET | Freq: Three times a day (TID) | ORAL | Status: DC
  Administered 2022-11-21 – 2022-11-23 (×6): 1000 mg via ORAL
  Filled 2022-11-21 (×6): qty 2

## 2022-11-21 MED ORDER — POTASSIUM CHLORIDE 10 MEQ/100ML IV SOLN
10.0000 meq | INTRAVENOUS | Status: AC
  Administered 2022-11-21 (×4): 10 meq via INTRAVENOUS
  Filled 2022-11-21 (×4): qty 100

## 2022-11-21 MED ORDER — ACETAMINOPHEN 500 MG PO TABS
1000.0000 mg | ORAL_TABLET | Freq: Four times a day (QID) | ORAL | Status: DC
  Administered 2022-11-21 – 2022-11-24 (×13): 1000 mg via ORAL
  Filled 2022-11-21 (×18): qty 2

## 2022-11-21 MED ORDER — LACTATED RINGERS IV BOLUS
1000.0000 mL | Freq: Once | INTRAVENOUS | Status: AC
  Administered 2022-11-21: 1000 mL via INTRAVENOUS

## 2022-11-21 MED ORDER — POTASSIUM CHLORIDE CRYS ER 20 MEQ PO TBCR
40.0000 meq | EXTENDED_RELEASE_TABLET | Freq: Two times a day (BID) | ORAL | Status: AC
  Administered 2022-11-21 (×2): 40 meq via ORAL
  Filled 2022-11-21 (×2): qty 2

## 2022-11-21 NOTE — Progress Notes (Signed)
Pt keeps removing O2.   Pt O2 sats drops to 50s-60s and eventually comes back up.  Called RT to try CPAP.  CPAP appled by RT and pt took off within 10-15 mins and sats dropped.  CPAP reapplied by this RN 2-3 times.  O2 sats dropping each time.  Nasal cannula applied w/mittens.  O2 sats dropped, O2 removed by pt, and mittens off pt. Spoke to on call NP about restraints.  Restraints applied and pt is aware.   Throughout this timeframe pt is educated on the importance of staying oxygenated. Pt states that she understands and will comply. Each time pt removes O2.

## 2022-11-21 NOTE — Progress Notes (Signed)
  Echocardiogram 2D Echocardiogram has been performed.  Wynelle Link 11/21/2022, 5:01 PM

## 2022-11-21 NOTE — Progress Notes (Incomplete Revision)
Pt keeps removing O2.   Pt O2 sats drops to 50s-60s and eventually comes back up.  Called RT to try CPAP.  CPAP appled by RT and pt took off within 10-15 mins and sats dropped.  CPAP reapplied by this RN 2-3 times.  O2 sats dropping each time.  Nasal cannula applied w/mittens.  O2 sats dropped, O2 removed by pt, and mittens off pt. Spoke to on call NP about restraints.  Restraints applied and pt is aware.   Throughout this timeframe pt is educated on the importance of staying oxygenated. Pt states that she understands and will comply. Each time pt removes O2.    0600 dressing on sacrum saturated. Dressing changed.

## 2022-11-21 NOTE — Code Documentation (Signed)
Stroke Response Nurse Documentation Code Documentation  Rosalind Guido is a 37 y.o. female admitted to [facility]  on 1/9 for gangrene with past medical hx of obesity, intracranial hypertension, HLD, afib. On No antithrombotic. Code stroke was activated by bedside nurse .   Patient on 5N where she was LKW at 1700 and RN noticed shaking in RUE and subjective aphasia at 1800.    Stroke team at the bedside after patient activation. Patient to CT with team. NIHSS 7, see documentation for details and code stroke times. Patient with bilateral hemianopia, bilateral leg weakness, and left decreased sensation on exam. Patient has baseline tunnel vision d/t pseudotumor and the leg weakness was her baseline as well. The following imaging was completed:  CT Head. Patient is not a candidate for IV Thrombolytic due to no stroke suspected per MD. Patient is not a candidate for IR due to no LVO per MD.   Care/Plan: code stroke cancelled at 1934.   Bedside handoff with RN Ander Purpura.    Lilly [place]  Stroke Response RN

## 2022-11-21 NOTE — Progress Notes (Signed)
Placed patient on bipap for the night with oxygen set at 5lpm  

## 2022-11-21 NOTE — Progress Notes (Signed)
Physical Therapy Treatment Patient Details Name: Lindsey [name] MRN: [id number] DOB: 1986-08-15 Today's Date: 11/21/2022   History of Present Illness 37 year old morbidly obese female who presented 11/01/22 with generalized weakness, shortness of breath and worsening buttock wounds for about a month. +afib with RVR; sepsis; to OR 1/10, 11/28/22, 1/13, 1/15 for necrotizing fascitis; post-op remained on vent, sedated with propofol, levophed; extubated 1/19;  PMH-diabetes , morbid obesity, pseudotumor cerebri, vision disturbance, idiopathic intracranial hypertension, HTN, panic disorder with agoraphobia.    PT Comments    Pt is very slowly progressing towards goals. Pt was able to decrease need for sitting up without back support in chair position in the bed. Attempted to get pt into standing with tilt function on bed but bedrail was having difficulty latching and unable to get tilt function to work; rail was able to latch by end of session by removing multiple lines/leads that were wedged in the mechanics. Pt was able to perform there-ex in bed with minimal movement SLR on the L and very brief lifting on the R. Pt is unaware of deficits and believes that she can get up and walk; educated on importance of core strength and being able to sit EOB which pt is unable to tolerate when deflated due to pain in the buttocks. Pt was able to perform wgt shifting with Min A in sitting on EOB; difficulty getting traction due to pt feet were unable to reach baseboard in the shortest position. Due to pt current functional mobility, available assistance at home and home set up recommending skilled physical therapy services in SNF setting on discharge from acute care hospital setting. Pt is short of breathe with very easy movements and requires frequent rest breaks as well as extra time to perform all activities.     Recommendations for follow up therapy are one component of a multi-disciplinary discharge planning process, led  by the attending physician.  Recommendations may be updated based on patient status, additional functional criteria and insurance authorization.  Follow Up Recommendations  Skilled nursing-short term rehab (<3 hours/day) Can patient physically be transported by private vehicle: No   Assistance Recommended at Discharge Frequent or constant Supervision/Assistance  Patient can return home with the following Two people to help with walking and/or transfers;Assistance with cooking/housework;Assist for transportation;Help with stairs or ramp for entrance   Equipment Recommendations  Other (comment) (defer to post acute)    Recommendations for Other Services       Precautions / Restrictions Precautions Precautions: Fall Precaution Comments: Rectal Tube, watch O2 and HR Restrictions Weight Bearing Restrictions: No     Mobility  Bed Mobility Overal bed mobility: Needs Assistance Bed Mobility: Rolling, Supine to Sit, Sit to Supine Rolling: Mod assist   Supine to sit: Max assist Sit to supine: Max assist   General bed mobility comments: bed placed in chair position with patient coming off back of bed and supporting self with LUE.  Rolling performed to address bed positioning and comfort as well as performing wgt shifting in sitting. Pt would mostly shift toward the L but was able to shift toward the R. Patient Response: Cooperative, Flat affect  Transfers       General transfer comment: Pt was able to sit up without back support with bed in chair position. Difficulty keeping feet on foot board. Placed blankets to get WB through bil feet. Pt slid down until feet were on baseboard and pt was able to push up to help reposition.    Ambulation/Gait  General Gait Details: unable at this time due to current functional status.       Balance Overall balance assessment: Needs assistance Sitting-balance support: Single extremity supported, Bilateral upper extremity  supported, Feet supported, Feet unsupported Sitting balance-Leahy Scale: Poor Sitting balance - Comments: Pt able to tolerate sitting with bed in chair position without back support with one extremity support and occasionally no UE support. Pt reports pain in the buttocks and requires encouragement to wgt shift. Pt will suddenly lay back without warning. Postural control: Left lateral lean         Cognition Arousal/Alertness: Awake/alert Behavior During Therapy: Flat affect Overall Cognitive Status: Impaired/Different from baseline Area of Impairment: Safety/judgement, Awareness, Problem solving, Following commands       Following Commands: Follows one step commands inconsistently, Follows one step commands with increased time Safety/Judgement: Decreased awareness of deficits, Decreased awareness of safety     General Comments: unaware of deficits        Exercises General Exercises - Lower Extremity Ankle Circles/Pumps: AROM, Both, 10 reps, Supine Quad Sets: AROM, AAROM, Both, 10 reps, Supine Hip ABduction/ADduction: AROM, Right, Left, 5 reps Straight Leg Raises: AROM, Right, Left, 10 reps    General Comments General comments (skin integrity, edema, etc.): on 5 L O2 via [place] with SPo2 100%      Pertinent Vitals/Pain Pain Assessment Pain Assessment: Faces Faces Pain Scale: Hurts little more Facial Expression: Tense Body Movements: Protection Muscle Tension: Tense, rigid Pain Location: R buttocks seems to be worse than the L Pt wgt shifting toward the L Pain Descriptors / Indicators: Discomfort, Grimacing Pain Intervention(s): Limited activity within patient's tolerance, Monitored during session, Repositioned     PT Goals (current goals can now be found in the care plan section) Acute Rehab PT Goals Patient Stated Goal: decreased pain, to sit up in chair PT Goal Formulation: With patient Time For Goal Achievement: 11/26/22 Potential to Achieve Goals: Fair Progress  towards PT goals: Progressing toward goals    Frequency    Min 3X/week      PT Plan Current plan remains appropriate       AM-PAC PT "6 Clicks" Mobility   Outcome Measure  Help needed turning from your back to your side while in a flat bed without using bedrails?: A Lot Help needed moving from lying on your back to sitting on the side of a flat bed without using bedrails?: A Lot Help needed moving to and from a bed to a chair (including a wheelchair)?: Total Help needed standing up from a chair using your arms (e.g., wheelchair or bedside chair)?: Total Help needed to walk in hospital room?: Total Help needed climbing 3-5 steps with a railing? : Total 6 Click Score: 8    End of Session Equipment Utilized During Treatment: Oxygen Activity Tolerance: Patient tolerated treatment well;Patient limited by fatigue;Patient limited by pain Patient left: in bed;with call bell/phone within reach;with bed alarm set Nurse Communication: Mobility status PT Visit Diagnosis: Muscle weakness (generalized) (M62.81);Difficulty in walking, not elsewhere classified (R26.2)     Time: 6144-3154 PT Time Calculation (min) (ACUTE ONLY): 59 min  Charges:  $Therapeutic Exercise: 8-22 mins $Therapeutic Activity: 38-52 mins                    Tomma Rakers, DPT, CLT  Acute Rehabilitation Services Office: [phone] (Secure chat preferred)    Ander Purpura 11/21/2022, 1:35 PM

## 2022-11-21 NOTE — Plan of Care (Signed)
  Problem: Education: Goal: Ability to describe self-care measures that may prevent or decrease complications (Diabetes Survival Skills Education) will improve Outcome: Progressing Goal: Individualized Educational Video(s) Outcome: Progressing   Problem: Coping: Goal: Ability to adjust to condition or change in health will improve Outcome: Progressing   

## 2022-11-21 NOTE — Consult Note (Signed)
NEUROLOGY CONSULTATION NOTE   Date of service: November 21, 2022 Patient Name: Lindsey [name] MRN:  [id number] DOB:  1985/12/25 Reason for consult: "stroke code for difficulty with sentences and tremoring" Requesting Provider: Hosie Poisson, MD _ _ _   _ __   _ __ _ _  __ __   _ __   __ _  History of Present Illness  Lindsey [name] is a 37 y.o. 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  Has been requiring opiods, gabapentin for pain control. This PM, concern for difficulty with speech and tremoring noted and a code stroke was activated.  On my evaluation, exam with tunneling of vision which per patient is chronic and residual from prior  exam with ?sensory deficit in all extremities but no aphasia, no focal weakness.  LKW: 1700 mRS: 4 tNKASE: not offered, low suspicion for stroke. Deficit is generalized and non focal. Thrombectomy: not offered, low suspicion for LVO. NIHSS components Score: Comment  1a Level of Conscious 0[x]  1[]  2[]  3[]      1b LOC Questions 0[x]  1[]  2[]       1c LOC Commands 0[x]  1[]  2[]       2 Best Gaze 0[x]  1[]  2[]       3 Visual 0[x]  1[]  2[]  3[]      4 Facial Palsy 0[x]  1[]  2[]  3[]      5a Motor Arm - left 0[x]  1[]  2[]  3[]  4[]  UN[]    5b Motor Arm - Right 0[x]  1[]  2[]  3[]  4[]  UN[]    6a Motor Leg - Left 0[]  1[x]  2[]  3[]  4[]  UN[]    6b Motor Leg - Right 0[]  1[x]  2[]  3[]  4[]  UN[]    7 Limb Ataxia 0[x]  1[]  2[]  3[]  UN[]     8 Sensory 0[]  1[]  2[x]  UN[]      9 Best Language 0[x]  1[]  2[]  3[]      10 Dysarthria 0[x]  1[]  2[]  UN[]      11 Extinct. and Inattention 0[x]  1[]  2[]       TOTAL: 4     ROS   Constitutional Denies weight loss, fever and chills.   HEENT Denies changes in vision and hearing.   Respiratory Denies SOB and cough.   CV Denies palpitations and CP   GI Denies abdominal pain, nausea, vomiting and diarrhea.   GU Denies  dysuria and urinary frequency.   MSK Denies myalgia and joint pain.   Skin Denies rash and pruritus.   Neurological Denies headache and syncope.   Psychiatric Denies recent changes in mood. Denies anxiety and depression.    Past History   Past Medical History:  Diagnosis Date   Anxiety and depression    Depression 02/22/2015   Diabetes ([place])    Headache(784.0)    High cholesterol    Hypercholesteremia    Hyperlipidemia    Hypertension    Hypothyroidism    Obesity    Panic disorder    Polycystic ovary    Pseudotumor cerebri    PTSD (post-traumatic stress disorder)    Past Surgical History:  Procedure Laterality Date   INCISION AND DRAINAGE PERIRECTAL ABSCESS N/A 11/19/2022   Procedure: EXCISIONAL DEBRIDEMENT PERINEAL WOUND;  Surgeon: Ralene Ok, MD;  Location: [place];  Service: General;  Laterality: N/A;   IRRIGATION AND DEBRIDEMENT ABSCESS N/A 2022/11/15   Procedure: EXCISIONAL DEBRIDEMENT PERINEAL WOUND;  Surgeon: Rolm Bookbinder, MD;  Location: [place];  Service: General;  Laterality: N/A;  IRRIGATION AND DEBRIDEMENT ABSCESS N/A 11/23/2022   Procedure: IRRIGATION AND EXCISIONAL DEBRIDEMENT PERINEAL WOUND;  Surgeon: Coralie Keens, MD;  Location: [place];  Service: General;  Laterality: N/A;   IRRIGATION AND DEBRIDEMENT BUTTOCKS Right 11/20/2022   Procedure: IRRIGATION AND DEBRIDEMENT BUTTOCKS;  Surgeon: Ralene Ok, MD;  Location: [facility] OR;  Service: General;  Laterality: Right;   LUMBAR PUNCTURE     WOUND DEBRIDEMENT N/A 11/10/2022   Procedure: EXCISIONAL DEBRIDEMENT OF BUTTOCK;  Surgeon: Rolm Bookbinder, MD;  Location: [facility] OR;  Service: General;  Laterality: N/A;   WOUND DEBRIDEMENT N/A 10/28/2022   Procedure: DEBRIDEMENT  BUTTOCK WOUND AND DRESSING CHANGE UNDER ANESTHESIA;  Surgeon: Coralie Keens, MD;  Location: [place];  Service: General;  Laterality: N/A;   Family History  Problem Relation Age of Onset   Polycystic ovary syndrome Mother    Coronary artery  disease Mother    Kidney disease Father    Narcolepsy Maternal Grandmother    Breast cancer Maternal Grandmother    Heart failure Maternal Grandfather    Cancer Other    Coronary artery disease Other    Diabetes Other    Social History   Socioeconomic History   Marital status: Single    Spouse name: Not on file   Number of children: 0   Years of education: 12   Highest education level: High school graduate  Occupational History   Occupation: Designer, 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  Tobacco Use   Smoking status: Former    Packs/day: 0.25    Types: Cigarettes   Smokeless tobacco: Never  Substance and Sexual Activity   Alcohol use: No    Alcohol/week: 0.0 standard drinks of alcohol   Drug use: Yes    Types: Marijuana    Comment: no longer using   Sexual activity: Yes    Partners: Male  Other Topics Concern   Not on file  Social History Narrative   Lives alone.   Right-handed.   No daily caffeine use.   Social Determinants of Health   Financial Resource Strain: Not on file  Food Insecurity: Not on file  Transportation Needs: Not on file  Physical Activity: Not on file  Stress: Not on file  Social Connections: Not on file   Allergies  Allergen Reactions   Bupropion Other (See Comments), Palpitations and Shortness Of Breath   Anti-Inflammatory Enzyme [Nutritional Supplements] Swelling    And fluid on the brain   Hydroxyzine Other (See Comments)   Metformin Nausea Only and Other (See Comments)   Nsaids Other (See Comments), Hypertension and Swelling   Rosuvastatin Nausea And Vomiting and Other (See Comments)    Medications   Medications Prior to Admission  Medication Sig Dispense Refill Last Dose   acetaZOLAMIDE ER (DIAMOX) 500 MG capsule Take 1 capsule (500 mg total) by mouth in the morning, at noon, and at bedtime. 90 capsule 12 11/02/2022 at am   glimepiride (AMARYL) 2 MG tablet Take 2 mg by mouth daily.   11/08/2022 at am   hydrOXYzine (ATARAX) 25 MG tablet Take  25 mg by mouth 3 (three) times daily as needed for anxiety (first line).    at [facility] 06/13/2022 #270   levothyroxine (SYNTHROID) 75 MCG tablet Take 75 mcg by mouth daily.   11/20/2022   pioglitazone (ACTOS) 15 MG tablet Take 45 mg by mouth daily.   11/09/2022 at am   pravastatin (PRAVACHOL) 20 MG tablet Take 20 mg by mouth once a week. Monday   10/31/2022  at monday   QUEtiapine (SEROQUEL) 50 MG tablet Take 75 mg by mouth at bedtime.   10/31/2022   escitalopram (LEXAPRO) 20 MG tablet Take 20 mg by mouth daily. (Patient not taking: Reported on 10/25/2022)   Not Taking   irbesartan (AVAPRO) 300 MG tablet Take 300 mg by mouth daily. (Patient not taking: Reported on 11/22/2022)   Not Taking at No fill at [facility]   LORazepam (ATIVAN) 1 MG tablet Take 1 mg by mouth daily as needed for anxiety (second option).    at [facility] 09/02/2022 #10   propranolol (INDERAL) 10 MG tablet Take 1 tablet (10 mg total) by mouth daily. (Patient taking differently: Take 20 mg by mouth 2 (two) times daily.) 30 tablet 0      Vitals   Vitals:   11/21/22 0503 11/21/22 0757 11/21/22 1556 11/21/22 1838  BP: (!) 140/69 (!) 157/62 (!) 179/66 (!) 156/84  Pulse: 79 86 100 (!) 114  Resp: 20 20 20  (!) 22  Temp: 98.3 F (36.8 C) 98.1 F (36.7 C) (!) 97.5 F (36.4 C) 98.2 F (36.8 C)  TempSrc:  Oral Oral Oral  SpO2: 100% 93% 96% 100%  Weight:      Height:         Body mass index is 74.48 kg/m.  Physical Exam   General: Laying comfortably in bed; in no acute distress.  HENT: Normal oropharynx and mucosa. Normal external appearance of ears and nose.  Neck: Supple, no pain or tenderness  CV: No JVD. No peripheral edema.  Pulmonary: Symmetric Chest rise. Shallow breaths, short of breath but that is chronic per patient. Abdomen: Soft to touch, non-tender.  Ext: No cyanosis, edema, or deformity  Skin: No rash. Normal palpation of skin.   Musculoskeletal: Normal digits and nails by inspection. No clubbing.   Neurologic Examination  Mental  status/Cognition: Alert, oriented to self, place, month and year, good attention.  Speech/language: Fluent, comprehension intact, object naming intact, repetition intact.  Cranial nerves:   CN II Pupils equal and reactive to light, tunneling of her vision with peripheral vision deficit throughout   CN III,IV,VI EOM intact, no gaze preference or deviation, no nystagmus    CN V normal sensation in V1, V2, and V3 segments bilaterally    CN VII no asymmetry, no nasolabial fold flattening    CN VIII normal hearing to speech    CN IX & X normal palatal elevation, no uvular deviation    CN XI 5/5 head turn and 5/5 shoulder shrug bilaterally    CN XII midline tongue protrusion   Motor:  Muscle bulk: normal, tone normal, pronator drift none, noted to havve asterixis/negative myoclonus on exam. Mvmt Root Nerve  Muscle Right Left Comments  SA C5/6 Ax Deltoid 4+ 4+   EF C5/6 Mc Biceps 5 5   EE C6/7/8 Rad Triceps 5 5   WF C6/7 Med FCR     WE C7/8 PIN ECU     F Ab C8/T1 U ADM/FDI 5 5   HF L1/2/3 Fem Illopsoas 3 3   KE L2/3/4 Fem Quad     DF L4/5 D Peron Tib Ant 5 5   PF S1/2 Tibial Grc/Sol 5 5    Sensation:  Light touch Decreased in all extremities when testing with eyes closed, no focal deficit.   Pin prick    Temperature    Vibration   Proprioception    Coordination/Complex Motor:  - Finger to Nose intact BL - Heel to  shin unable to do 2/2 body habitus. - Rapid alternating movement are normal - Gait: Deferred.  Labs   CBC:  Recent Labs  Lab 11/18/22 2301 11/21/22 0342  WBC 9.6 7.7  NEUTROABS 6.4 4.6  HGB 7.6* 7.5*  HCT 25.6* 24.9*  MCV 97.7 96.9  PLT 257 [id number]    Basic Metabolic Panel:  Lab Results  Component Value Date   NA 137 11/21/2022   K 3.0 (L) 11/21/2022   CO2 21 (L) 11/21/2022   GLUCOSE 188 (H) 11/21/2022   BUN 20 11/21/2022   CREATININE 1.33 (H) 11/21/2022   CALCIUM 8.1 (L) 11/21/2022   GFRNONAA 53 (L) 11/21/2022   GFRAA >60 07/03/2016   Lipid Panel: No  results found for: "LDLCALC" HgbA1c:  Lab Results  Component Value Date   HGBA1C 14.7 (H) 11/14/2022   Urine Drug Screen: No results found for: "LABOPIA", "COCAINSCRNUR", "LABBENZ", "AMPHETMU", "THCU", "LABBARB"  Alcohol Level No results found for: "ETH"  CT Head without contrast(Personally reviewed): CTH was negative for a large hypodensity concerning for a large territory infarct or hyperdensity concerning for an [place]  Impression   Lindsey [name] is a 37 y.o. 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  Per discussion with team, stroke code activated for confusion/difficulty with sentences and tremors. No focal deficit on exam. Has generalized sensory deficit on exam which is non disabling and would be unlikely to be caused by a stroke given its bilateral nature.  Overall, given absence of focal deficit, low suspicion for stroke. She does have asterixis on exam, more notable in RUE than LUE. This is likely toxic metabolic from opiods, gabapentin. Recent ABG with no CO2 retention.  Recommendations  - Ammonia, TSH. - outpatient EMG/NCS. - judicious use of opiods and gabapentin given noted asterixis on exam. - low suspicion for stroke, will cancel code stroke. ______________________________________________________________________  Plan discussed with Dr. Karleen Hampshire at bedside.  Update 8:00 AM: TSH and ammonia normal. Likely etiology of Asterixis is opiods/gabapentin. Recommend judicious use. She will need outpatient EMG/NCS. We will signoff.  Plan discussed with Dr. Karleen Hampshire over secure chat.  Thank you for the opportunity to take part in the care of this patient. If you have any further questions, please contact the neurology consultation attending.  Signed,  [place] Pager Number [phone] _ _ _   _ __   _ __ _ _  __  __   _ __   __ _

## 2022-11-21 NOTE — Progress Notes (Addendum)
[facility]                                                                               Lindsey [name], is a 37 y.o. female, DOB - [birth year], VOH:[id number] Admit date - 2022-11-30    Outpatient Primary MD for the patient is [name], MD  LOS - 20  days    Brief summary   37 year old lady with prior history of type 2 diabetes mellitus, morbid obesity, OHS, hypertension for thyroidism, PCOS, pseudotumor cerebri, depression and anxiety, PTSD presents with worsening right perineal and gluteal region wounds. CT of the abdomen and pelvis consistent with Fournier's gangrene involving the perineum on the right, medial and posteromedial aspects of the right gluteal region. Patient underwent debridement on multiple occasions and currently getting daily dressing changes with normal saline. Therapy evaluations recommending SNF.   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  She  completed the course of Unasyn.  General surgery and wound care on board, appreciate their management. Daily dressing changes with normal saline. Her flexi seal appears to be loose and coming out several times a day as per RN .  Pain control and nutrition. WBC count normalized. Patient was sedated after getting  Dilaudid hence decreased the dose from 2 mg to 0.5 mg as needed for dressing changes. She is participating with PT and recommendations for SNF.    Type 2 diabetes mellitus Uncontrolled with hypoglycemia and hyperglycemia Last A1c is 14.7 CBG (last 3)  Recent Labs    11/21/22 0416 11/21/22 0753 11/21/22 1129  GLUCAP 174* 173* 215*    His CBGs have been well under 200s so far continue with sliding scale insulin. Increase Semglee to 25 units daily and increase novolog 4 units TIDAC.   Continue monitor CBGs     Acute kidney injury Probably from poor oral intake.  Creatinine around 1.33 today. Avoid nephrotoxic medications. Ringer lactate given today repeat renal parameters in the morning.   Hypokalemia:  Replaced.,  Magnesium level is normal Replacements ordered and repeat levels in the morning   Acute respiratory failure in the setting of OHS BiPAP at night and nasal cannula oxygen to keep sats greater than 90%. Patient has been refusing BiPAP/CPAP at nights the last 4 days she is currently on 5 L of nasal cannula oxygen to keep sats greater than 90%.  ABG this morning shows pH of 7.33 pCO2 of 36 and pO2 of 79. CXR today shows Low lung volumes with bronchovascular crowding. Persistent bibasilar patchy opacities, likely atelectasis. Cardiomegaly with prominent bilateral perihilar vascular Congestion.  Will check Echo and get BNP Encourage incentive spirometry.      Altered mental status overnight:  ABG ordered. To check for hypercarbia . Patient has been refusing cpap at night.  Ammonia level ordered. CT head without contrast ordered for further eval.    Essential hypertension Blood pressure parameters are optimal Patient currently is on amlodipine-hydralazine patient is on hydralazine 25 mg. Continue the same.  Acute anemia of blood loss from debridement procedures Transfuse to keep hemoglobin greater than 7.  Hemoglobin this morning at 7.6    Hypernatremia from free water deficit Appears to have resolved, continue to monitor    Hypothyroidism Continue with levothyroxine    History of anxiety, depression, PTSD, panic disorder Resume home medication   Hyperlipidemia Continue with statin.  History of idiopathic intracranial hypertension Continue with Diamox 3 times daily   Body mass index is 74.48 kg/m. Morbid obesity.    Physical deconditioning Suspect not going to be a CIR candidate (but they are following) and body  habitus precludes SNF unable to go to LTAC due to lack of insurance.  Therapy eval recommending SNF.    RN Pressure Injury Documentation:    Malnutrition Type:  Nutrition Problem: Increased nutrient needs Etiology: wound healing   Malnutrition Characteristics:  Signs/Symptoms: estimated needs   Nutrition Interventions:  Interventions: Refer to RD note for recommendations  Estimated body mass index is 74.48 kg/m as calculated from the following:   Height as of this encounter: 5\' 6"  (1.676 m).   Weight as of this encounter: 209.3 kg.  Code Status: Full code DVT Prophylaxis:  heparin injection 7,500 Units Start: 11/11/22 1400 SCDs Start: 11/21/2022 2241   Level of Care: Level of care: Telemetry Surgical Family Communication: None at bedside  Disposition Plan:     Remains inpatient appropriate: CIR versus SNF  Procedures:  S/p debridement of buttock NSTI  11/17/2022 Dr. Rosendo Gros  S/p excisional debridement buttock and perineal wound 10/28/2022 Dr. Donne Hazel S/p excisional debridement perineal wound 11/02/2022 Dr. Rosendo Gros S/P excisional debridement perineal wound and buttock wound 10/29/2022 Dr. Ninfa Linden  Consultants:   General surgery  Antimicrobials:   Anti-infectives (From admission, onward)    Start     Dose/Rate Route Frequency Ordered Stop   11/10/22 1600  Ampicillin-Sulbactam (UNASYN) 3 g in sodium chloride 0.9 % 100 mL IVPB        3 g 200 mL/hr over 30 Minutes Intravenous Every 6 hours 11/10/22 1438 11/13/22 2323   11/10/22 1400  Ampicillin-Sulbactam (UNASYN) 3 g in sodium chloride 0.9 % 100 mL IVPB  Status:  Discontinued        3 g 200 mL/hr over 30 Minutes Intravenous Every 6 hours 11/10/22 1113 11/10/22 1438   11/06/2022 1630  Ampicillin-Sulbactam (UNASYN) 3 g in sodium chloride 0.9 % 100 mL IVPB  Status:  Discontinued        3 g 200 mL/hr over 30 Minutes Intravenous Every 6 hours 11/16/2022 1051 11/10/22 1113   November 07, 2022 1600  piperacillin-tazobactam (ZOSYN) IVPB 3.375 g   Status:  Discontinued        3.375 g 12.5 mL/hr over 240 Minutes Intravenous Every 8 hours 11/19/2022 1459 10/24/2022 1031   11/02/22 1400  linezolid (ZYVOX) IVPB 600 mg  Status:  Discontinued        600 mg 300 mL/hr over 60 Minutes Intravenous Every 12 hours 11/02/22 1222 11/04/22 0830   11/02/22 1000  vancomycin (VANCOREADY) IVPB 1250 mg/250 mL  Status:  Discontinued        1,250 mg 166.7 mL/hr over 90 Minutes Intravenous Every 12 hours 11/11/2022 2135 11/08/2022 2137   11/02/22 1000  vancomycin (VANCOREADY) IVPB 1500 mg/300 mL  Status:  Discontinued        1,500 mg 150 mL/hr over 120 Minutes Intravenous Every 12 hours 11/18/2022 2137 11/02/22 1222   11/02/22 0300  ceFEPIme (MAXIPIME) 2 g in sodium chloride 0.9 % 100 mL  IVPB  Status:  Discontinued        2 g 200 mL/hr over 30 Minutes Intravenous Every 8 hours 11/06/2022 2135 11/23/2022 2258   11/02/22 0045  vancomycin (VANCOREADY) IVPB 2000 mg/400 mL        2,000 mg 200 mL/hr over 120 Minutes Intravenous  Once 11/02/22 0030 11/02/22 0305   11/13/2022 2315  piperacillin-tazobactam (ZOSYN) IVPB 3.375 g  Status:  Discontinued        3.375 g 12.5 mL/hr over 240 Minutes Intravenous Every 8 hours 11/02/2022 2306 10/25/2022 1459   11/17/2022 2315  clindamycin (CLEOCIN) IVPB 900 mg  Status:  Discontinued        900 mg 100 mL/hr over 30 Minutes Intravenous Every 8 hours 11/16/2022 2306 11/02/22 1222   11/08/2022 1930  vancomycin (VANCOCIN) IVPB 1000 mg/200 mL premix  Status:  Discontinued       See Hyperspace for full Linked Orders Report.   1,000 mg 200 mL/hr over 60 Minutes Intravenous  Once 11/12/2022 1823 11/02/22 0029   10/28/2022 1830  ceFEPIme (MAXIPIME) 2 g in sodium chloride 0.9 % 100 mL IVPB        2 g 200 mL/hr over 30 Minutes Intravenous  Once 10/31/2022 1819 10/25/2022 2016   11/19/2022 1830  metroNIDAZOLE (FLAGYL) IVPB 500 mg        500 mg 100 mL/hr over 60 Minutes Intravenous  Once 11/20/2022 1819 10/28/2022 2130   11/18/2022 1830  vancomycin (VANCOCIN) IVPB 1000  mg/200 mL premix  Status:  Discontinued        1,000 mg 200 mL/hr over 60 Minutes Intravenous  Once 11/19/2022 1819 11/18/2022 1823   11/02/2022 1830  vancomycin (VANCOCIN) IVPB 1000 mg/200 mL premix  Status:  Discontinued       See Hyperspace for full Linked Orders Report.   1,000 mg 200 mL/hr over 60 Minutes Intravenous  Once 10/25/2022 1823 11/02/22 0029        Medications  Scheduled Meds:  (feeding supplement) PROSource Plus  30 mL Oral TID BM   sodium chloride   Intravenous Once   sodium chloride   Intravenous Once   acetaminophen  1,000 mg Oral Q6H   acetaZOLAMIDE  500 mg Oral TID   alteplase  2 mg Intracatheter Once   amLODipine  10 mg Oral Daily   ascorbic acid  250 mg Oral Daily   Chlorhexidine Gluconate Cloth  6 each Topical Q0600   docusate sodium  100 mg Oral BID   famotidine  20 mg Oral BID   feeding supplement  237 mL Oral BID BM   ferrous sulfate  325 mg Oral BID WC   gabapentin  100 mg Oral TID   heparin  7,500 Units Subcutaneous Q8H   hydrALAZINE  25 mg Oral Q6H   insulin aspart  0-15 Units Subcutaneous TID AC & HS   insulin aspart  4 Units Subcutaneous TID WC   insulin glargine-yfgn  25 Units Subcutaneous Daily   levothyroxine  75 mcg Oral Q0600   methocarbamol  1,000 mg Oral Q8H   nutrition supplement (JUVEN)  1 packet Oral BID BM   mouth rinse  15 mL Mouth Rinse 4 times per day   potassium chloride  40 mEq Oral BID   pravastatin  10 mg Oral q1800   QUEtiapine  75 mg Oral QHS   sodium chloride flush  10-40 mL Intracatheter Q12H   Zinc Oxide   Topical TID   Continuous Infusions:  potassium chloride  PRN Meds:.calcium carbonate, hydrALAZINE, HYDROmorphone (DILAUDID) injection, labetalol, ondansetron, mouth rinse, mouth rinse, oxyCODONE, oxyCODONE, polyethylene glycol, senna-docusate, sodium chloride flush, sodium chloride flush    Subjective:   Lindsey [name] was seen and examined today.  Alert and answering all questions appropriately.  Discussed the  importance of using CPAP at night. She reports some shortness of breath Objective:   Vitals:   11/20/22 2328 11/21/22 0453 11/21/22 0503 11/21/22 0757  BP: (!) 146/56  (!) 140/69 (!) 157/62  Pulse:   79 86  Resp:   20 20  Temp:   98.3 F (36.8 C) 98.1 F (36.7 C)  TempSrc: Oral   Oral  SpO2:   100% 93%  Weight:  (!) 209.3 kg    Height:        Intake/Output Summary (Last 24 hours) at 11/21/2022 1303 Last data filed at 11/21/2022 1136 Gross per 24 hour  Intake 840 ml  Output 7955 ml  Net -7115 ml    Filed Weights   11/10/22 0343 11/14/22 0600 11/21/22 0453  Weight: (!) 216 kg (!) 223.3 kg (!) 209.3 kg     Exam General exam: Morbidly obese lady not in any kind of distress Respiratory system: Diminished air entry throughout, on 5 L of nasal cannula oxygen, some tachypnea Cardiovascular system: S1 & S2 heard, RRR. No JVD, Gastrointestinal system: Abdomen is nondistended, soft and nontender.  Central nervous system: Alert and oriented to place and person and year.  Extremities: No cyanosis Skin: Right gluteal wound Psychiatry: Flat affect     Data Reviewed:  I have personally reviewed following labs and imaging studies   CBC Lab Results  Component Value Date   WBC 7.7 11/21/2022   RBC 2.57 (L) 11/21/2022   HGB 7.5 (L) 11/21/2022   HCT 24.9 (L) 11/21/2022   MCV 96.9 11/21/2022   MCH 29.2 11/21/2022   PLT 271 11/21/2022   MCHC 30.1 11/21/2022   RDW 17.1 (H) 11/21/2022   LYMPHSABS 1.7 11/21/2022   MONOABS 1.1 (H) 11/21/2022   EOSABS 0.3 11/21/2022   BASOSABS 0.0 11/21/2022     Last metabolic panel Lab Results  Component Value Date   NA 137 11/21/2022   K 3.0 (L) 11/21/2022   CL 108 11/21/2022   CO2 21 (L) 11/21/2022   BUN 20 11/21/2022   CREATININE 1.33 (H) 11/21/2022   GLUCOSE 188 (H) 11/21/2022   GFRNONAA 53 (L) 11/21/2022   GFRAA >60 07/03/2016   CALCIUM 8.1 (L) 11/21/2022   PHOS 3.6 11/06/2022   PROT 6.6 11/14/2022   ALBUMIN 1.6 (L)  11/14/2022   BILITOT 0.5 11/14/2022   ALKPHOS 96 11/14/2022   AST 17 11/14/2022   ALT 20 11/14/2022   ANIONGAP 8 11/21/2022    CBG (last 3)  Recent Labs    11/21/22 0416 11/21/22 0753 11/21/22 1129  GLUCAP 174* 173* 215*       Coagulation Profile: No results for input(s): "INR", "PROTIME" in the last 168 hours.   Radiology Studies: DG CHEST PORT 1 VIEW  Result Date: 11/21/2022 CLINICAL DATA:  Acute respiratory failure for follow-up of prior chest radiograph demonstrating interstitial edema EXAM: PORTABLE CHEST 1 VIEW COMPARISON:  Chest radiograph dated 11/14/2022 FINDINGS: Lines/tubes: Left upper extremity PICC tip projects over the superior cavoatrial junction. Chest: Low lung volumes with bronchovascular crowding. Prominent bilateral perihilar vascular congestion. Persistent bibasilar patchy opacities. Pleura: No pneumothorax or pleural effusion. Heart/mediastinum: Enlarged cardiomediastinal silhouette. Bones: No acute osseous abnormality. IMPRESSION: 1. Low lung volumes with bronchovascular  crowding. Persistent bibasilar patchy opacities, likely atelectasis. 2. Cardiomegaly with prominent bilateral perihilar vascular congestion. Electronically Signed   By: Darrin Nipper M.D.   On: 11/21/2022 11:19       Hosie Poisson M.D. [facility] 11/21/2022, 1:03 PM  Available via Epic secure chat 7am-7pm After 7 pm, please refer to night coverage provider listed on amion.

## 2022-11-21 NOTE — Progress Notes (Signed)
[facility] Progress Note  14 Days Post-Op  Subjective: CC-  Ready for dressing change.  Objective: Vital signs in last 24 hours: Temp:  [98.1 F (36.7 C)-98.3 F (36.8 C)] 98.1 F (36.7 C) (01/29 0757) Pulse Rate:  [79-86] 86 (01/29 0757) Resp:  [20] 20 (01/29 0757) BP: (140-157)/(56-69) 157/62 (01/29 0757) SpO2:  [93 %-100 %] 93 % (01/29 0757) Weight:  [209.3 kg] 209.3 kg (01/29 0453) Last BM Date : 11/21/22  Intake/Output from previous day: 01/28 0701 - 01/29 0700 In: 1090 [P.O.:1080; I.V.:10] Out: 8155 [Urine:8155] Intake/Output this shift: Total I/O In: -  Out: 1400 [Urine:1400]  PE: Gen:  Awake and alert, NAD GU: indwelling foley in place, rectal tube in place with some leakage Wound: pictured below. Perineal and right labial wound communicating with buttock wound with mixture of granulation tissue and fibrinous tissue/exudate at the base of the wound. No purulent drainage. Wound does track several cm towards right groin - no  loculations; otherwise no significant tracking.  Periwound clean but with significant skin breakdown in skin folds and between thighs that is painful      Lab Results:  Recent Labs    11/18/22 2301 11/21/22 0342  WBC 9.6 7.7  HGB 7.6* 7.5*  HCT 25.6* 24.9*  PLT 257 271   BMET Recent Labs    11/20/22 0500 11/21/22 0342  NA 139 137  K 3.5 3.0*  CL 110 108  CO2 19* 21*  GLUCOSE 188* 188*  BUN 17 20  CREATININE 1.21* 1.33*  CALCIUM 8.1* 8.1*   PT/INR No results for input(s): "LABPROT", "INR" in the last 72 hours. CMP     Component Value Date/Time   NA 137 11/21/2022 0342   K 3.0 (L) 11/21/2022 0342   CL 108 11/21/2022 0342   CO2 21 (L) 11/21/2022 0342   GLUCOSE 188 (H) 11/21/2022 0342   BUN 20 11/21/2022 0342   CREATININE 1.33 (H) 11/21/2022 0342   CALCIUM 8.1 (L) 11/21/2022 0342   PROT 6.6 11/14/2022 1047   ALBUMIN 1.6 (L) 11/14/2022 1047   AST 17 11/14/2022 1047   ALT 20 11/14/2022 1047   ALKPHOS  96 11/14/2022 1047   BILITOT 0.5 11/14/2022 1047   GFRNONAA 53 (L) 11/21/2022 0342   GFRAA >60 07/03/2016 0004   Lipase  No results found for: "LIPASE"     Studies/Results: DG CHEST PORT 1 VIEW  Result Date: 11/21/2022 CLINICAL DATA:  Acute respiratory failure for follow-up of prior chest radiograph demonstrating interstitial edema EXAM: PORTABLE CHEST 1 VIEW COMPARISON:  Chest radiograph dated 11/14/2022 FINDINGS: Lines/tubes: Left upper extremity PICC tip projects over the superior cavoatrial junction. Chest: Low lung volumes with bronchovascular crowding. Prominent bilateral perihilar vascular congestion. Persistent bibasilar patchy opacities. Pleura: No pneumothorax or pleural effusion. Heart/mediastinum: Enlarged cardiomediastinal silhouette. Bones: No acute osseous abnormality. IMPRESSION: 1. Low lung volumes with bronchovascular crowding. Persistent bibasilar patchy opacities, likely atelectasis. 2. Cardiomegaly with prominent bilateral perihilar vascular congestion. Electronically Signed   By: Darrin Nipper M.D.   On: 11/21/2022 11:19    Anti-infectives: Anti-infectives (From admission, onward)    Start     Dose/Rate Route Frequency Ordered Stop   11/10/22 1600  Ampicillin-Sulbactam (UNASYN) 3 g in sodium chloride 0.9 % 100 mL IVPB        3 g 200 mL/hr over 30 Minutes Intravenous Every 6 hours 11/10/22 1438 11/13/22 2323   11/10/22 1400  Ampicillin-Sulbactam (UNASYN) 3 g in sodium chloride 0.9 % 100 mL IVPB  Status:  Discontinued        3 g 200 mL/hr over 30 Minutes Intravenous Every 6 hours 11/10/22 1113 11/10/22 1438   11/04/2022 1630  Ampicillin-Sulbactam (UNASYN) 3 g in sodium chloride 0.9 % 100 mL IVPB  Status:  Discontinued        3 g 200 mL/hr over 30 Minutes Intravenous Every 6 hours 11/06/2022 1051 11/10/22 1113   10/31/2022 1600  piperacillin-tazobactam (ZOSYN) IVPB 3.375 g  Status:  Discontinued        3.375 g 12.5 mL/hr over 240 Minutes Intravenous Every 8 hours 11/16/2022 1459  10/27/2022 1031   11/02/22 1400  linezolid (ZYVOX) IVPB 600 mg  Status:  Discontinued        600 mg 300 mL/hr over 60 Minutes Intravenous Every 12 hours 11/02/22 1222 11/04/22 0830   11/02/22 1000  vancomycin (VANCOREADY) IVPB 1250 mg/250 mL  Status:  Discontinued        1,250 mg 166.7 mL/hr over 90 Minutes Intravenous Every 12 hours 11/22/2022 2135 11/04/2022 2137   11/02/22 1000  vancomycin (VANCOREADY) IVPB 1500 mg/300 mL  Status:  Discontinued        1,500 mg 150 mL/hr over 120 Minutes Intravenous Every 12 hours 11/11/2022 2137 11/02/22 1222   11/02/22 0300  ceFEPIme (MAXIPIME) 2 g in sodium chloride 0.9 % 100 mL IVPB  Status:  Discontinued        2 g 200 mL/hr over 30 Minutes Intravenous Every 8 hours 11/09/2022 2135 10/25/2022 2258   11/02/22 0045  vancomycin (VANCOREADY) IVPB 2000 mg/400 mL        2,000 mg 200 mL/hr over 120 Minutes Intravenous  Once 11/02/22 0030 11/02/22 0305   11/06/2022 2315  piperacillin-tazobactam (ZOSYN) IVPB 3.375 g  Status:  Discontinued        3.375 g 12.5 mL/hr over 240 Minutes Intravenous Every 8 hours 11/15/2022 2306 11/15/2022 1459   11/14/2022 2315  clindamycin (CLEOCIN) IVPB 900 mg  Status:  Discontinued        900 mg 100 mL/hr over 30 Minutes Intravenous Every 8 hours 11/19/2022 2306 11/02/22 1222   10/25/2022 1930  vancomycin (VANCOCIN) IVPB 1000 mg/200 mL premix  Status:  Discontinued       See Hyperspace for full Linked Orders Report.   1,000 mg 200 mL/hr over 60 Minutes Intravenous  Once 11/02/2022 1823 11/02/22 0029   11/06/2022 1830  ceFEPIme (MAXIPIME) 2 g in sodium chloride 0.9 % 100 mL IVPB        2 g 200 mL/hr over 30 Minutes Intravenous  Once 10/26/2022 1819 11/04/2022 2016   10/25/2022 1830  metroNIDAZOLE (FLAGYL) IVPB 500 mg        500 mg 100 mL/hr over 60 Minutes Intravenous  Once 11/22/2022 1819 10/25/2022 2130   11/15/2022 1830  vancomycin (VANCOCIN) IVPB 1000 mg/200 mL premix  Status:  Discontinued        1,000 mg 200 mL/hr over 60 Minutes Intravenous  Once  10/30/2022 1819 11/08/2022 1823   11/15/2022 1830  vancomycin (VANCOCIN) IVPB 1000 mg/200 mL premix  Status:  Discontinued       See Hyperspace for full Linked Orders Report.   1,000 mg 200 mL/hr over 60 Minutes Intravenous  Once 11/12/2022 1823 11/02/22 0029        Assessment/Plan S/p debridement of buttock NSTI  11/16/2022 Dr. Rosendo Gros  S/p excisional debridement buttock and perineal wound 11/15/2022 Dr. Donne Hazel S/p excisional debridement perineal wound 11-14-22 Dr. Rosendo Gros S/P excisional debridement perineal wound  and buttock wound 10/30/2022 Dr. Magnus Ivan - No further plans to return to the OR at this time. Cx's with GBS, strep mitis/oralis, Prevotella bivia. Completed abx. WBC normalized and pt afebrile - Continue oxy to 20mg  for dressing change pre-medication, dilaudid PRN if oral pain medication not adequate but she did well today without IV pain medication - Continue daily dressing changes with normal saline, or PRN for soilage. Surgery will plan to evaluate wounds again on Thursday - Continue destin (triple paste) to periwound and interdry in left inguinal fold and under right buttock to help wick moisture and prevent further skin breakdown. - Ideally this patient would get a diverting colostomy to keep stool out of her wound but I think her body habitus precludes this.    FEN - carb mod VTE - SQH ID - Clindamycin, Zosyn, Vanc; narrowed to unasyn 1/15>>1/21   - below per CCM -  Septic shock - resolved ARF - improved DM  Morbid obesity - BMI 79 Hypothryoidism PCOS HLD Anxiety/Depression    LOS: 20 days    09-05-1985, [facility] 11/21/2022, 3:36 PM Please see Amion for pager number during day hours 7:00am-4:30pm

## 2022-11-22 DIAGNOSIS — G932 Benign intracranial hypertension: Secondary | ICD-10-CM

## 2022-11-22 LAB — BASIC METABOLIC PANEL
Anion gap: 8 (ref 5–15)
BUN: 21 mg/dL — ABNORMAL HIGH (ref 6–20)
CO2: 21 mmol/L — ABNORMAL LOW (ref 22–32)
Calcium: 8.4 mg/dL — ABNORMAL LOW (ref 8.9–10.3)
Chloride: 113 mmol/L — ABNORMAL HIGH (ref 98–111)
Creatinine, Ser: 1.05 mg/dL — ABNORMAL HIGH (ref 0.44–1.00)
GFR, Estimated: >60 mL/min (ref >60)
Glucose, Bld: 227 mg/dL — ABNORMAL HIGH (ref 70–99)
Potassium: 3.6 mmol/L (ref 3.5–5.1)
Sodium: 142 mmol/L (ref 135–145)

## 2022-11-22 LAB — CBC WITH DIFFERENTIAL/PLATELET
Abs Immature Granulocytes: 0.05 10*3/uL (ref 0.00–0.07)
Basophils Absolute: 0 10*3/uL (ref 0.0–0.1)
Basophils Relative: 0 %
Eosinophils Absolute: 0.3 10*3/uL (ref 0.0–0.5)
Eosinophils Relative: 5 %
HCT: 25.4 % — ABNORMAL LOW (ref 36.0–46.0)
Hemoglobin: 7.6 g/dL — ABNORMAL LOW (ref 12.0–15.0)
Immature Granulocytes: 1 %
Lymphocytes Relative: 17 %
Lymphs Abs: 1.2 10*3/uL (ref 0.7–4.0)
MCH: 29.2 pg (ref 26.0–34.0)
MCHC: 29.9 g/dL — ABNORMAL LOW (ref 30.0–36.0)
MCV: 97.7 fL (ref 80.0–100.0)
Monocytes Absolute: 0.8 10*3/uL (ref 0.1–1.0)
Monocytes Relative: 11 %
Neutro Abs: 4.6 10*3/uL (ref 1.7–7.7)
Neutrophils Relative %: 66 %
Platelets: 266 10*3/uL (ref 150–400)
RBC: 2.6 MIL/uL — ABNORMAL LOW (ref 3.87–5.11)
RDW: 17.2 % — ABNORMAL HIGH (ref 11.5–15.5)
WBC: 7 10*3/uL (ref 4.0–10.5)
nRBC: 0.6 % — ABNORMAL HIGH (ref 0.0–0.2)

## 2022-11-22 LAB — GLUCOSE, CAPILLARY
Glucose-Capillary: 141 mg/dL — ABNORMAL HIGH (ref 70–99)
Glucose-Capillary: 181 mg/dL — ABNORMAL HIGH (ref 70–99)
Glucose-Capillary: 191 mg/dL — ABNORMAL HIGH (ref 70–99)
Glucose-Capillary: 193 mg/dL — ABNORMAL HIGH (ref 70–99)
Glucose-Capillary: 218 mg/dL — ABNORMAL HIGH (ref 70–99)
Glucose-Capillary: 220 mg/dL — ABNORMAL HIGH (ref 70–99)

## 2022-11-22 LAB — AMMONIA: Ammonia: 32 umol/L (ref 9–35)

## 2022-11-22 LAB — TSH: TSH: 1.935 u[IU]/mL (ref 0.350–4.500)

## 2022-11-22 MED ORDER — ACETAMINOPHEN 325 MG PO TABS: 650.0000 mg | ORAL_TABLET | ORAL | Status: DC | PRN

## 2022-11-22 MED ORDER — INSULIN GLARGINE-YFGN 100 UNIT/ML ~~LOC~~ SOLN
30.0000 [IU] | Freq: Every day | SUBCUTANEOUS | Status: DC
  Administered 2022-11-23 – 2022-11-25 (×3): 30 [IU] via SUBCUTANEOUS
  Filled 2022-11-22 (×4): qty 0.3

## 2022-11-22 NOTE — Progress Notes (Signed)
Inpatient Diabetes Program Recommendations  AACE/ADA: New Consensus Statement on Inpatient Glycemic Control (2015)  Target Ranges:  Prepandial:   less than 140 mg/dL      Peak postprandial:   less than 180 mg/dL (1-2 hours)      Critically ill patients:  140 - 180 mg/dL   Lab Results  Component Value Date   GLUCAP 220 (H) 11/22/2022   HGBA1C 14.7 (H) 11/18/2022    Review of Glycemic Control  Latest Reference Range & Units 11/21/22 07:53 11/21/22 11:29 11/21/22 16:35 11/21/22 18:36 11/21/22 21:25 11/22/22 00:14 11/22/22 05:29 11/22/22 06:48 11/22/22 12:21  Glucose-Capillary 70 - 99 mg/dL 173 (H) 215 (H) 178 (H) 192 (H) 247 (H) 218 (H) 191 (H) 193 (H) 220 (H)   Diabetes history: DM2 Outpatient Diabetes medications: Actos 45 mg daily, Amaryl 2 mg daily Current orders for Inpatient glycemic control:  Semglee 25 units Daily Novolog 0-15 units tid  Ensure Enlive bid between meals  Inpatient Diabetes Program Recommendations:    -   Increase Semglee to 30 units -   Increase Novolog meal coverage to 5 units tid if eating >50% of meals  Thanks,  Tama Headings RN, MSN, BC-ADM Inpatient Diabetes Coordinator Team Pager [phone] (8a-5p)

## 2022-11-22 NOTE — Progress Notes (Signed)
[facility]                                                                               Lindsey [name], is a 37 y.o. female, DOB - 1986-05-22, EUM:[id number] Admit date - 10/27/2022    Outpatient Primary MD for the patient is [name], MD  LOS - 21  days    Brief summary   37 year old lady with prior history of type 2 diabetes mellitus, morbid obesity, OHS, hypertension for thyroidism, PCOS, pseudotumor cerebri, depression and anxiety, PTSD presents with worsening right perineal and gluteal region wounds. CT of the abdomen and pelvis consistent with Fournier's gangrene involving the perineum on the right, medial and posteromedial aspects of the right gluteal region. Patient underwent debridement on multiple occasions and currently getting daily dressing changes with normal saline. Therapy evaluations recommending SNF.   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  She  completed the course of Unasyn.  General surgery and wound care on board, appreciate their management. Daily dressing changes with normal saline. Her flexi seal appears to be loose and coming out several times a day as per RN .  Pain control and nutrition. WBC count normalized. Patient was sedated after getting  Dilaudid hence decreased the dose from 2 mg to 0.5 mg as needed for dressing changes. She is participating with PT and recommendations for SNF.    Type 2 diabetes mellitus Uncontrolled with hypoglycemia and hyperglycemia Last A1c is 14.7 CBG (last 3)  Recent Labs    11/22/22 0529 11/22/22 0648 11/22/22 1221  GLUCAP 191* 193* 220*    His CBGs have been well under 200s so far continue with sliding scale insulin. Increase Semglee to 30  units daily and increase novolog 4 units  TIDAC.  Continue monitor CBGs     Acute kidney injury Probably from poor oral intake. Creatinine fluctuating between 1 to 1.33. Avoid nephrotoxic medications.    Hypokalemia:  Replaced, repeat levels wnl.    Acute respiratory failure in the setting of OHS BiPAP at night and nasal cannula oxygen to keep sats greater than 90%. Patient has been refusing BiPAP/CPAP at nights the last 4 days she is currently on 5 L of nasal cannula oxygen to keep sats greater than 90%.  ABG  on 11/21/22 shows pH of 7.33 pCO2 of 36 and pO2 of 79. CXR today shows Low lung volumes with bronchovascular crowding. Persistent bibasilar patchy opacities, likely atelectasis. Cardiomegaly with prominent bilateral perihilar vascular Congestion.  BNP is 121.  Echocardiogram shows Left ventricular ejection fraction, 60 to 65% with normal function. Left ventricular endocardial border  not optimally defined to evaluate regional wall motion. Left ventricular  diastolic parameters are indeterminate.  Encourage incentive spirometry.      Acute metabolic encephalopathy of unclear etiology suspect medication such as IV dilaudid and gabapentin.  ABG ordered, does not show hypercarbia. TSH and ammonia wnl.  Patient has been refusing cpap at night.  Code stroke called yesterday evening  as she had word finding difficulty and unable to finish sentences. Neurology consulted and recommendations given. Discussed the plan with the patient and her mom at bedside. Discontinued IV dilaudid and Gabapentin today and monitor her mental status in the next 24 hours.  Neurology also recommends outpatient EMG/NCS.  No further work up in patient.    Essential hypertension Blood pressure parameters are optimal Patient currently is on amlodipine-hydralazine patient is on hydralazine 25 mg. Continue the same.      Acute anemia of blood loss from debridement procedures Transfuse to keep hemoglobin greater than 7.  Hemoglobin stable at   7.6    Hypernatremia from free water deficit Appears to have resolved, continue to monitor    Hypothyroidism Continue with levothyroxine    History of anxiety, depression, PTSD, panic disorder Resume home medication   Hyperlipidemia Continue with statin.  History of idiopathic intracranial hypertension Continue with Diamox 3 times daily   Body mass index is 74.48 kg/m. Morbid obesity.    Physical deconditioning Suspect not going to be a CIR candidate (but they are following) and body habitus precludes SNF unable to go to LTAC due to lack of insurance.  Therapy eval recommending SNF.    RN Pressure Injury Documentation:    Malnutrition Type:  Nutrition Problem: Increased nutrient needs Etiology: wound healing   Malnutrition Characteristics:  Signs/Symptoms: estimated needs   Nutrition Interventions:  Interventions: Refer to RD note for recommendations  Estimated body mass index is 74.48 kg/m as calculated from the following:   Height as of this encounter: 5\' 6"  (1.676 m).   Weight as of this encounter: 209.3 kg.  Code Status: Full code DVT Prophylaxis:  heparin injection 7,500 Units Start: 11/11/22 1400 SCDs Start: 11/08/2022 2241   Level of Care: Level of care: Telemetry Surgical Family Communication: family at bedside.   Disposition Plan:     Remains inpatient appropriate: CIR versus SNF  Procedures:  S/p debridement of buttock NSTI  10/26/2022 Dr. 12/31/22  S/p excisional debridement buttock and perineal wound 11/10/2022 Dr. 01/02/23 S/p excisional debridement perineal wound 11-06-2022 Dr. 11/12/2022 S/P excisional debridement perineal wound and buttock wound 11/11/2022 Dr. 11/09/22  Consultants:   General surgery  Antimicrobials:   Anti-infectives (From admission, onward)    Start     Dose/Rate Route Frequency Ordered Stop   11/10/22 1600  Ampicillin-Sulbactam (UNASYN) 3 g in sodium chloride 0.9 % 100 mL IVPB        3 g 200 mL/hr over 30 Minutes  Intravenous Every 6 hours 11/10/22 1438 11/13/22 2323   11/10/22 1400  Ampicillin-Sulbactam (UNASYN) 3 g in sodium chloride 0.9 % 100 mL IVPB  Status:  Discontinued        3 g 200 mL/hr over 30 Minutes Intravenous Every 6 hours 11/10/22 1113 11/10/22 1438   11/13/2022 1630  Ampicillin-Sulbactam (UNASYN) 3 g in sodium chloride 0.9 % 100 mL IVPB  Status:  Discontinued        3 g 200 mL/hr over 30 Minutes Intravenous Every 6 hours 11/18/2022 1051 11/10/22 1113   11/23/2022 1600  piperacillin-tazobactam (ZOSYN) IVPB 3.375 g  Status:  Discontinued        3.375 g 12.5 mL/hr over 240 Minutes Intravenous Every 8 hours 10/27/2022 1459 11/14/2022 1031   11/02/22 1400  linezolid (ZYVOX) IVPB 600 mg  Status:  Discontinued        600 mg 300 mL/hr over 60 Minutes Intravenous Every 12 hours 11/02/22 1222 11/04/22 0830   11/02/22 1000  vancomycin (VANCOREADY) IVPB 1250 mg/250 mL  Status:  Discontinued        1,250 mg 166.7 mL/hr over 90 Minutes Intravenous Every 12 hours 11/20/2022 2135 11/19/2022 2137   11/02/22 1000  vancomycin (VANCOREADY) IVPB 1500 mg/300 mL  Status:  Discontinued        1,500 mg 150 mL/hr over 120 Minutes Intravenous Every 12 hours 11/21/2022 2137 11/02/22 1222   11/02/22 0300  ceFEPIme (MAXIPIME) 2 g in sodium chloride 0.9 % 100 mL IVPB  Status:  Discontinued        2 g 200 mL/hr over 30 Minutes Intravenous Every 8 hours 11/09/2022 2135 11/14/2022 2258   11/02/22 0045  vancomycin (VANCOREADY) IVPB 2000 mg/400 mL        2,000 mg 200 mL/hr over 120 Minutes Intravenous  Once 11/02/22 0030 11/02/22 0305   11/13/2022 2315  piperacillin-tazobactam (ZOSYN) IVPB 3.375 g  Status:  Discontinued        3.375 g 12.5 mL/hr over 240 Minutes Intravenous Every 8 hours 11/18/2022 2306 11/06/2022 1459   10/31/2022 2315  clindamycin (CLEOCIN) IVPB 900 mg  Status:  Discontinued        900 mg 100 mL/hr over 30 Minutes Intravenous Every 8 hours 10/26/2022 2306 11/02/22 1222   10/28/2022 1930  vancomycin (VANCOCIN) IVPB 1000 mg/200  mL premix  Status:  Discontinued       See Hyperspace for full Linked Orders Report.   1,000 mg 200 mL/hr over 60 Minutes Intravenous  Once 10/26/2022 1823 11/02/22 0029   11/22/2022 1830  ceFEPIme (MAXIPIME) 2 g in sodium chloride 0.9 % 100 mL IVPB        2 g 200 mL/hr over 30 Minutes Intravenous  Once 11/10/2022 1819 10/24/2022 2016   10/28/2022 1830  metroNIDAZOLE (FLAGYL) IVPB 500 mg        500 mg 100 mL/hr over 60 Minutes Intravenous  Once 11/11/2022 1819 11/15/2022 2130   11/11/2022 1830  vancomycin (VANCOCIN) IVPB 1000 mg/200 mL premix  Status:  Discontinued        1,000 mg 200 mL/hr over 60 Minutes Intravenous  Once 11/12/2022 1819 11/10/2022 1823   11/06/2022 1830  vancomycin (VANCOCIN) IVPB 1000 mg/200 mL premix  Status:  Discontinued       See Hyperspace for full Linked Orders Report.   1,000 mg 200 mL/hr over 60 Minutes Intravenous  Once 10/26/2022 1823 11/02/22 0029        Medications  Scheduled Meds:  (feeding supplement) PROSource Plus  30 mL Oral TID BM   sodium chloride   Intravenous Once   sodium chloride   Intravenous Once   acetaminophen  1,000 mg Oral Q6H   acetaZOLAMIDE  500 mg Oral TID   alteplase  2 mg Intracatheter Once   amLODipine  10 mg Oral Daily   ascorbic acid  250 mg Oral Daily   Chlorhexidine Gluconate Cloth  6 each Topical Q0600   docusate sodium  100 mg Oral BID   famotidine  20 mg Oral BID   feeding supplement  237 mL Oral BID BM   ferrous sulfate  325 mg Oral BID WC   heparin  7,500 Units Subcutaneous Q8H   hydrALAZINE  25 mg Oral Q6H   insulin aspart  0-15 Units Subcutaneous TID AC & HS   insulin aspart  4 Units Subcutaneous TID WC   insulin glargine-yfgn  25 Units Subcutaneous Daily   levothyroxine  75 mcg Oral Q0600   methocarbamol  1,000 mg  Oral Q8H   nutrition supplement (JUVEN)  1 packet Oral BID BM   mouth rinse  15 mL Mouth Rinse 4 times per day   pravastatin  10 mg Oral q1800   QUEtiapine  75 mg Oral QHS   sodium chloride flush  10-40 mL  Intracatheter Q12H   Zinc Oxide   Topical TID   Continuous Infusions:  PRN Meds:.calcium carbonate, hydrALAZINE, labetalol, ondansetron, mouth rinse, mouth rinse, oxyCODONE, oxyCODONE, polyethylene glycol, senna-docusate, sodium chloride flush, sodium chloride flush    Subjective:   Cherisse Carrell was seen and examined today.  She remains confused, she received IV dilaudid this morning.  Mother at bedside that upset that she is getting IV dilaudid.  She also reports that patient did not use gabapentin at home even though she was prescribed.  Seroquel is a long term drug and wants to continue .  Objective:   Vitals:   11/21/22 2009 11/21/22 2328 11/22/22 0022 11/22/22 0655  BP: (!) 152/61  (!) 156/81 (!) 155/64  Pulse: (!) 102 85  (!) 110  Resp: 18 (!) 25    Temp: 99 F (37.2 C)   98.9 F (37.2 C)  TempSrc: Oral   Oral  SpO2: 100% 100%  99%  Weight:      Height:        Intake/Output Summary (Last 24 hours) at 11/22/2022 1303 Last data filed at 11/22/2022 1234 Gross per 24 hour  Intake 165.26 ml  Output 3000 ml  Net -2834.74 ml    Filed Weights   11/10/22 0343 11/14/22 0600 11/21/22 0453  Weight: (!) 216 kg (!) 223.3 kg (!) 209.3 kg     Exam General exam: morbidly obese lady , not in distress.  Respiratory system: diminished air entry on 5 lit of [place] oxygen.  Cardiovascular system: S1 & S2 heard, RRR. No JVD, murmurs,  Gastrointestinal system: Abdomen is nondistended, soft and nontender. . Central nervous system: alert, and answering simple questions, grossly non focal.  Extremities:  no cyanosis  Skin: right gluteal wound.  Psychiatry: unable to assess.       Data Reviewed:  I have personally reviewed following labs and imaging studies   CBC Lab Results  Component Value Date   WBC 7.0 11/22/2022   RBC 2.60 (L) 11/22/2022   HGB 7.6 (L) 11/22/2022   HCT 25.4 (L) 11/22/2022   MCV 97.7 11/22/2022   MCH 29.2 11/22/2022   PLT 266 11/22/2022   MCHC 29.9  (L) 11/22/2022   RDW 17.2 (H) 11/22/2022   LYMPHSABS 1.2 11/22/2022   MONOABS 0.8 11/22/2022   EOSABS 0.3 11/22/2022   BASOSABS 0.0 93/81/8299     Last metabolic panel Lab Results  Component Value Date   NA 142 11/22/2022   K 3.6 11/22/2022   CL 113 (H) 11/22/2022   CO2 21 (L) 11/22/2022   BUN 21 (H) 11/22/2022   CREATININE 1.05 (H) 11/22/2022   GLUCOSE 227 (H) 11/22/2022   GFRNONAA >60 11/22/2022   GFRAA >60 07/03/2016   CALCIUM 8.4 (L) 11/22/2022   PHOS 3.6 11/11/2022   PROT 6.6 11/14/2022   ALBUMIN 1.6 (L) 11/14/2022   BILITOT 0.5 11/14/2022   ALKPHOS 96 11/14/2022   AST 17 11/14/2022   ALT 20 11/14/2022   ANIONGAP 8 11/22/2022    CBG (last 3)  Recent Labs    11/22/22 0529 11/22/22 0648 11/22/22 1221  GLUCAP 191* 193* 220*       Coagulation Profile: No results for input(s): "INR", "  PROTIME" in the last 168 hours.   Radiology Studies: CT HEAD CODE STROKE WO CONTRAST`  Result Date: 11/21/2022 CLINICAL DATA:  Code stroke.  Altered mental status EXAM: CT HEAD WITHOUT CONTRAST TECHNIQUE: Contiguous axial images were obtained from the base of the skull through the vertex without intravenous contrast. RADIATION DOSE REDUCTION: This exam was performed according to the departmental dose-optimization program which includes automated exposure control, adjustment of the mA and/or kV according to patient size and/or use of iterative reconstruction technique. COMPARISON:  11/06/2022 FINDINGS: Brain: No evidence of acute infarction, hemorrhage, mass, mass effect, or midline shift. No hydrocephalus or extra-axial collection. Vascular: No hyperdense vessel. Skull: Negative for fracture or focal lesion. Sinuses/Orbits: No acute finding. Other: Fluid in left mastoid air cells. ASPECTS ([place] Stroke Program Early CT Score) - Ganglionic level infarction (caudate, lentiform nuclei, internal capsule, insula, M1-M3 cortex): 7 - Supraganglionic infarction (M4-M6 cortex): 3 Total score  (0-10 with 10 being normal): 10 IMPRESSION: There is no acute hemorrhage or evidence of acute infarction. ASPECT score is 10. Imaging results were communicated on 11/21/2022 at 7:25 pm to provider Dr. Lorrin Goodell via secure text paging. Electronically Signed   By: Merilyn Baba M.D.   On: 11/21/2022 19:25   ECHOCARDIOGRAM COMPLETE  Result Date: 11/21/2022    ECHOCARDIOGRAM REPORT   Patient Name:   Lindsey [name] Date of Exam: 11/21/2022 Medical Rec #:  [id number]      Height:       66.0 in Accession #:    [id number]     Weight:       461.4 lb Date of Birth:  [birth year]      BSA:          2.853 m Patient Age:    32 years       BP:           179/66 mmHg Patient Gender: F              HR:           105 bpm. Exam Location:  Inpatient Procedure: 2D Echo, Color Doppler and Cardiac Doppler Indications:    Dyspnea R06.00  History:        Patient has no prior history of Echocardiogram examinations.                 Risk Factors:Former Smoker, Diabetes, Dyslipidemia and                 Hypertension.  Sonographer:    Greer Pickerel Referring Phys: Theola Sequin  Sonographer Comments: Patient is obese and suboptimal subcostal window. Image acquisition challenging due to patient body habitus and Image acquisition challenging due to respiratory motion. IMPRESSIONS  1. Left ventricular ejection fraction, by estimation, is 60 to 65%. The left ventricle has normal function. Left ventricular endocardial border not optimally defined to evaluate regional wall motion. Left ventricular diastolic parameters are indeterminate.  2. Right ventricular systolic function was not well visualized. The right ventricular size is normal. Tricuspid regurgitation signal is inadequate for assessing PA pressure.  3. The mitral valve was not well visualized. No evidence of mitral valve regurgitation. No evidence of mitral stenosis.  4. The aortic valve was not well visualized. Aortic valve regurgitation is not visualized. No aortic stenosis is present.   5. The inferior vena cava is normal in size with greater than 50% respiratory variability, suggesting right atrial pressure of 3 mmHg.  6. Technically limited study due to poor sound wave transmission.  FINDINGS  Left Ventricle: Left ventricular ejection fraction, by estimation, is 60 to 65%. The left ventricle has normal function. Left ventricular endocardial border not optimally defined to evaluate regional wall motion. The left ventricular internal cavity size was normal in size. There is no left ventricular hypertrophy. Left ventricular diastolic parameters are indeterminate. Right Ventricle: The right ventricular size is normal. No increase in right ventricular wall thickness. Right ventricular systolic function was not well visualized. Tricuspid regurgitation signal is inadequate for assessing PA pressure. The tricuspid regurgitant velocity is 1.63 m/s, and with an assumed right atrial pressure of 8 mmHg, the estimated right ventricular systolic pressure is 18.6 mmHg. Left Atrium: Left atrial size was normal in size. Right Atrium: Right atrial size was normal in size. Pericardium: There is no evidence of pericardial effusion. Mitral Valve: The mitral valve was not well visualized. No evidence of mitral valve regurgitation. No evidence of mitral valve stenosis. Tricuspid Valve: The tricuspid valve is normal in structure. Tricuspid valve regurgitation is trivial. No evidence of tricuspid stenosis. Aortic Valve: The aortic valve was not well visualized. Aortic valve regurgitation is not visualized. No aortic stenosis is present. Pulmonic Valve: The pulmonic valve was grossly normal. Pulmonic valve regurgitation is trivial. No evidence of pulmonic stenosis. Aorta: The aortic root and ascending aorta are structurally normal, with no evidence of dilitation. Venous: The inferior vena cava is normal in size with greater than 50% respiratory variability, suggesting right atrial pressure of 3 mmHg. IAS/Shunts: No atrial  level shunt detected by color flow Doppler.  LEFT VENTRICLE PLAX 2D LVIDd:         5.60 cm   Diastology LVIDs:         3.70 cm   LV e' medial:    9.32 cm/s LV PW:         1.10 cm   LV E/e' medial:  10.5 LV IVS:        1.20 cm   LV e' lateral:   14.30 cm/s LVOT diam:     2.40 cm   LV E/e' lateral: 6.8 LV SV:         71 LV SV Index:   25 LVOT Area:     4.52 cm  RIGHT VENTRICLE RV S prime:     20.50 cm/s TAPSE (M-mode): 2.4 cm LEFT ATRIUM           Index        RIGHT ATRIUM           Index LA diam:      3.90 cm 1.37 cm/m   RA Area:     15.40 cm LA Vol (A2C): 45.0 ml 15.77 ml/m  RA Volume:   37.10 ml  13.00 ml/m LA Vol (A4C): 55.8 ml 19.56 ml/m  AORTIC VALVE LVOT Vmax:   106.00 cm/s LVOT Vmean:  66.500 cm/s LVOT VTI:    0.156 m  AORTA Ao Root diam: 3.40 cm Ao Asc diam:  2.50 cm MITRAL VALVE               TRICUSPID VALVE MV Area (PHT): 4.29 cm    TR Peak grad:   10.6 mmHg MV Decel Time: 177 msec    TR Vmax:        163.00 cm/s MV E velocity: 97.40 cm/s MV A velocity: 42.60 cm/s  SHUNTS MV E/A ratio:  2.29        Systemic VTI:  0.16 m  Systemic Diam: 2.40 cm Arvilla Meres MD Electronically signed by Arvilla Meres MD Signature Date/Time: 11/21/2022/5:37:05 PM    Final    DG CHEST PORT 1 VIEW  Result Date: 11/21/2022 CLINICAL DATA:  Acute respiratory failure for follow-up of prior chest radiograph demonstrating interstitial edema EXAM: PORTABLE CHEST 1 VIEW COMPARISON:  Chest radiograph dated 11/14/2022 FINDINGS: Lines/tubes: Left upper extremity PICC tip projects over the superior cavoatrial junction. Chest: Low lung volumes with bronchovascular crowding. Prominent bilateral perihilar vascular congestion. Persistent bibasilar patchy opacities. Pleura: No pneumothorax or pleural effusion. Heart/mediastinum: Enlarged cardiomediastinal silhouette. Bones: No acute osseous abnormality. IMPRESSION: 1. Low lung volumes with bronchovascular crowding. Persistent bibasilar patchy opacities,  likely atelectasis. 2. Cardiomegaly with prominent bilateral perihilar vascular congestion. Electronically Signed   By: Agustin Cree M.D.   On: 11/21/2022 11:19       Kathlen Mody M.D. [facility] 11/22/2022, 1:03 PM  Available via Epic secure chat 7am-7pm After 7 pm, please refer to night coverage provider listed on amion.

## 2022-11-22 NOTE — TOC Progression Note (Addendum)
Transition of Care [facility]) - Progression Note    Patient Details  Name: Lindsey [name] MRN: [id number] Date of Birth: August 08, 1986  Transition of [facility]) CM/SW Contact  Joanne Chars, LCSW Phone Number: 11/22/2022, 10:56 AM  Clinical Narrative:   No SNF bed offers.  30 day note uploaded in [place] Must for passr.  PASSR received: [id number]   Expected Discharge Plan: [place] Barriers to Discharge: Continued Medical Work up, SNF Pending bed offer, Other (must enter comment) (bariatric, uninsured)  Expected Discharge Plan and Services In-house Referral: Clinical Social Work   Post Acute Care Choice: [place] Living arrangements for the past 2 months: Single Family Home                                       Social Determinants of Health (SDOH) Interventions SDOH Screenings   Depression (PHQ2-9): High Risk (03/14/2022)  Tobacco Use: Medium Risk (11/16/2022)    Readmission Risk Interventions     No data to display

## 2022-11-22 NOTE — Plan of Care (Signed)
  Problem: Education: Goal: Ability to describe self-care measures that may prevent or decrease complications (Diabetes Survival Skills Education) will improve Outcome: Progressing Goal: Individualized Educational Video(s) Outcome: Progressing   Problem: Coping: Goal: Ability to adjust to condition or change in health will improve Outcome: Progressing   Problem: Fluid Volume: Goal: Ability to maintain a balanced intake and output will improve Outcome: Progressing   Problem: Metabolic: Goal: Ability to maintain appropriate glucose levels will improve Outcome: Progressing   Problem: Nutritional: Goal: Maintenance of adequate nutrition will improve Outcome: Progressing Goal: Progress toward achieving an optimal weight will improve Outcome: Progressing

## 2022-11-22 NOTE — Progress Notes (Signed)
Writer changed patient wound dressings twice this shift, no new changes noted, will continue to monitor.
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
## 2022-11-23 DIAGNOSIS — G9341 Metabolic encephalopathy: Secondary | ICD-10-CM

## 2022-11-23 LAB — BLOOD GAS, ARTERIAL
Acid-base deficit: 5.6 mmol/L — ABNORMAL HIGH (ref 0.0–2.0)
Bicarbonate: 21.1 mmol/L (ref 20.0–28.0)
O2 Saturation: 97.9 %
Patient temperature: 36.5
pCO2 arterial: 44 mmHg (ref 32–48)
pH, Arterial: 7.29 — ABNORMAL LOW (ref 7.35–7.45)
pO2, Arterial: 81 mmHg — ABNORMAL LOW (ref 83–108)

## 2022-11-23 LAB — GLUCOSE, CAPILLARY
Glucose-Capillary: 184 mg/dL — ABNORMAL HIGH (ref 70–99)
Glucose-Capillary: 153 mg/dL — ABNORMAL HIGH (ref 70–99)
Glucose-Capillary: 126 mg/dL — ABNORMAL HIGH (ref 70–99)
Glucose-Capillary: 114 mg/dL — ABNORMAL HIGH (ref 70–99)

## 2022-11-23 MED ORDER — METHOCARBAMOL 500 MG PO TABS
1000.0000 mg | ORAL_TABLET | Freq: Three times a day (TID) | ORAL | Status: DC | PRN
  Administered 2022-11-23 – 2022-11-24 (×2): 1000 mg via ORAL
  Filled 2022-11-23 (×2): qty 2

## 2022-11-23 NOTE — Progress Notes (Signed)
Physical Therapy Treatment Patient Details Name: Lindsey [name] MRN: [id number] DOB: 1986-06-23 Today's Date: 11/23/2022   History of Present Illness 37 year old morbidly obese female who presented 11/01/22 with generalized weakness, shortness of breath and worsening buttock wounds for about a month. +afib with RVR; sepsis; to OR 1/10, 1/11, 1/13, 11/17/22 for necrotizing fascitis; post-op remained on vent, sedated with propofol, levophed; extubated 1/19;  PMH-diabetes, morbid obesity, pseudotumor cerebri, vision disturbance, idiopathic intracranial hypertension, HTN, panic disorder with agoraphobia.    PT Comments    Pt received in supine, agreeable to therapy session and with good participation and tolerance for tilt bed elevation to 16* and HOB elevation to 58* to simulate bed chair posture and encourage long sitting attempts. Pt needing up to +2 modA for supine to long sitting trial with +2 HHA. Pt coughing when attempting to drink thin liquids during session despite upright head posture and cues for small sips, RN/MD notified. Pt tolerated elevated HOB ~15 mins and tilt with stable BP (improved DBP from supine to tilt/elevated HOB) and did c/o increased pain in bottom with long sitting. Pt continues to benefit from PT services to progress toward functional mobility goals.   Recommendations for follow up therapy are one component of a multi-disciplinary discharge planning process, led by the attending physician.  Recommendations may be updated based on patient status, additional functional criteria and insurance authorization.  Follow Up Recommendations  Skilled nursing-short term rehab (<3 hours/day) Can patient physically be transported by private vehicle: No   Assistance Recommended at Discharge Frequent or constant Supervision/Assistance  Patient can return home with the following Two people to help with walking and/or transfers;Assistance with cooking/housework;Assist for transportation;Help  with stairs or ramp for entrance;Assistance with feeding;Direct supervision/assist for medications management;Direct supervision/assist for financial management   Equipment Recommendations  Other (comment) (TBD post-acute)    Recommendations for Other Services       Precautions / Restrictions Precautions Precautions: Fall Precaution Comments: Rectal Tube/urinary catheter, watch O2 and HR Restrictions Weight Bearing Restrictions: No     Mobility  Bed Mobility Overal bed mobility: Needs Assistance Bed Mobility: Rolling, Supine to Sit Rolling: Mod assist   Supine to sit: Mod assist, +2 for physical assistance, HOB elevated Sit to supine: +2 for safety/equipment, Mod assist   General bed mobility comments: cues and education on positioning self in bed. Patient often moving off right buttocks and leaning toward L bed rail. Pt bed tilted to 16* and HOB at ~60* from this posture pt able to raise trunk to long sitting with +2 modA Start Time: 1425 Angle: 16 degrees Total Minutes in Angle: 15 minutes Patient Response: Anxious, Impulsive, Restless  Transfers                   General transfer comment: performed tilting in bed to address progressing with transfers, pt keeping RLE on foot board but frequently lifting LLE off foot board. Bed at 16* and HOB at 60* for some WB through her feet ~15 mins    Ambulation/Gait                   Stairs             Wheelchair Mobility    Modified Rankin (Stroke Patients Only)       Balance Overall balance assessment: Needs assistance Sitting-balance support: Single extremity supported, Bilateral upper extremity supported, Feet supported, Feet unsupported Sitting balance-Leahy Scale: Poor Sitting balance - Comments: pulled self into long sitting position with  assistance of 2 to maintain Postural control: Left lateral lean                                  Cognition Arousal/Alertness:  Awake/alert Behavior During Therapy: Flat affect Overall Cognitive Status: Impaired/Different from baseline Area of Impairment: Safety/judgement, Awareness, Problem solving, Following commands                 Orientation Level: Disoriented to, Place, Situation Current Attention Level: Focused Memory: Decreased recall of precautions, Decreased short-term memory Following Commands: Follows one step commands inconsistently, Follows one step commands with increased time Safety/Judgement: Decreased awareness of deficits, Decreased awareness of safety Awareness: Intellectual Problem Solving: Slow processing, Decreased initiation, Difficulty sequencing, Requires verbal cues, Requires tactile cues General Comments: unaware of deficits, pt with poor attention needs frequent reminders to follow through with cues given (frequent lean to L side of bed rails/frame and needs reminders to scoot her shoulders to bed midline ~6 occasions during session). Possible aspiration of thin liquids despite HOB at 60* and cues for her to take small sips from straw and tuck chin when swallowing, pt coughing on 3 or 4 attempts at sipping. RN/MD notified.        Exercises General Exercises - Lower Extremity Ankle Circles/Pumps: AAROM, Both, 10 reps, Supine Heel Slides: AAROM, Both, 5 reps, Supine Hip ABduction/ADduction: AROM, Right, Left, 5 reps Other Exercises Other Exercises: midline long sitting from bed tilted/partial chair posture ~2 mins with B HHA    General Comments General comments (skin integrity, edema, etc.): performed tilting in bed with patient tolerating 16 degree bed tilt and 58 degrees HOB. Patient stated she felt fearful at 16 degrees      Pertinent Vitals/Pain Pain Assessment Pain Assessment: Faces Faces Pain Scale: Hurts little more Pain Location: bottom, right buttocks Pain Descriptors / Indicators: Discomfort, Grimacing, Guarding Pain Intervention(s): Monitored during session,  Limited activity within patient's tolerance, Repositioned     PT Goals (current goals can now be found in the care plan section) Acute Rehab PT Goals Patient Stated Goal: decreased pain, to sit up in chair PT Goal Formulation: With patient Time For Goal Achievement: 11/26/22 Progress towards PT goals: Progressing toward goals    Frequency    Min 3X/week      PT Plan Current plan remains appropriate    Co-evaluation PT/OT/SLP Co-Evaluation/Treatment: Yes Reason for Co-Treatment: Complexity of the patient's impairments (multi-system involvement);For patient/therapist safety;To address functional/ADL transfers PT goals addressed during session: Mobility/safety with mobility;Balance;Strengthening/ROM OT goals addressed during session: Strengthening/ROM      AM-PAC PT "6 Clicks" Mobility   Outcome Measure  Help needed turning from your back to your side while in a flat bed without using bedrails?: A Lot Help needed moving from lying on your back to sitting on the side of a flat bed without using bedrails?: Total (from flat bed/no rails) Help needed moving to and from a bed to a chair (including a wheelchair)?: Total Help needed standing up from a chair using your arms (e.g., wheelchair or bedside chair)?: Total Help needed to walk in hospital room?: Total Help needed climbing 3-5 steps with a railing? : Total 6 Click Score: 7    End of Session Equipment Utilized During Treatment: Oxygen (tilt bed straps) Activity Tolerance: Patient tolerated treatment well;Patient limited by fatigue Patient left: in bed;with call bell/phone within reach;Other (comment) (x4 rails up per air bed and HOB >30* due  to risk of aspiration) Nurse Communication: Mobility status;Precautions;Other (comment) (pt needs hygiene assist/bed pad change due to damp pads under her; may need SLP consult again due to possible aspiration; pt wants to eat [employer]) PT Visit Diagnosis: Muscle weakness (generalized)  (M62.81);Difficulty in walking, not elsewhere classified (R26.2)     Time: 4196-2229 PT Time Calculation (min) (ACUTE ONLY): 44 min  Charges:  $Therapeutic Activity: 8-22 mins                     Ryleigh Buenger P., PTA Acute Rehabilitation Services Secure Chat Preferred 9a-5:30pm Office: [place] 11/23/2022, 3:21 PM

## 2022-11-23 NOTE — Progress Notes (Signed)
[facility]  PROGRESS NOTE  Lindsey [name] DGL:[id number] DOB: July 03, 1986 DOA: 12/01/2022 PCP: Lindsey Floro, MD  Brief hospital course  Lindsey [name] is a 37 yo female with PMH DMII, morbid obesity, probable OHS, depression/anxiety, HLD, HTN, hypothyroidism, PCOS, pseudotumor cerebri, PTSD who presented with worsening buttocks wounds.  Initially started around Thanksgiving and essentially had slowly progressively worsened prompting presentation to the ER. CT abdomen/pelvis performed and was consistent with Fournier's gangrene involving the perineum on the right and medial/posteromedial aspects of the right gluteal region. Significant Hospital Events:  CT abdomen pelvis Dec 01, 2022 > basilar atelectasis, normal stomach, appendix, bowel wall, no hernia, mild to moderate subcutaneous impaired muscular inflammatory fat stranding anterolateral aspect abdominal wall on the left, no ascites.  Soft tissue gas along the perineum on the right with medial and posterior medial right gluteal involvement consistent with Fournier's gangrene OR for debridement 12/01/2022, 1/11, 1/13 Blood cultures 12/01/2022 > negative x  days Urine culture December 01, 2022 > yeast 70 K Right buttock wound culture 1/10 > GPC, GNR > abundant group B strep, abundant strep mitis, Prevotella PICC placed 1/12 Extubated 1/19 to BiPAP      Subjective   Patient seen and examined, somnolent but arousable.  As per patient's mother at bedside patient has become more somnolent after she received oxycodone this morning.  She received oxycodone 20 mg this morning.     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 -Wound culture grew group B strep, Streptococcus mitis, Prevotella -She underwent debridement on 12/01/2022, 1/11, 1/13, 1/15; completed a course of Unasyn -General surgery and wound care on board -Getting daily dressing changes with normal saline -Has Flexi-Seal in place -Continue pain control -Completed  antibiotics in the hospital -Dose of oxycodone will be changed to 5 to 10 mg every 4 hours as needed; discontinue oxycodone 20 mg due to sedation -Dose of Dilaudid changed 2.5 mg as needed for dressing changes  Acute metabolic encephalopathy -Likely multifactorial from underlying OHS, sedation from opioids and medications -ABG obtained shows pH 7.29, pCO2 44, pO2 81; consistent with chronic respiratory acidosis and metabolic acidosis.  Will discontinue Diamox at this time -Also dose of oxycodone 20 mg every 4 hours as needed has been discontinued, continue oxycodone 5 to 10 mg every 4 hours as needed -Gabapentin was discontinued -Will also change Robaxin from scheduled to to 1000 mg every 8 hours as needed -Neurology was consulted; felt likely from opioids -Recommend outpatient EMG/NCS  Acute respiratory failure in setting of OHS -Patient not using CPAP at that time for past 4 days -Currently O2 sats 97% on room air -Chest x-ray showed low lung volumes with bronchovascular crowding.  Persistent bibasilar patchy opacities likely atelectasis.  Cardiomegaly with prominent bilateral perihilar vascular congestion. -Echocardiogram shows Left ventricular ejection fraction, 60 to 65% with normal function. Left ventricular endocardial border  not optimally defined to evaluate regional wall motion. Left ventricular  diastolic parameters are indeterminate.  -Encourage incentive spirometry.   Diabetes mellitus type 2 -Last hemoglobin A1c 14.7 -CBGs have been fairly well-controlled -Continue sliding scale insulin with NovoLog, meal coverage with NovoLog 4 units 3 times daily -Continue Semglee 30 units subcu daily  Acute kidney injury -Likely from poor p.o. intake -Creatinine fluctuating from 1-1.33 -Avoid nephrotoxic medications  Anemia from blood loss -Hemoglobin stable at 7.6 -Transfuse to keep Hb greater than 7  Hypertension -Patient is currently on amlodipine/hydralazine -Blood pressure  well-controlled  Hypernatremia -Resolved  Hypothyroidism Continue with levothyroxine  History of anxiety, depression, PTSD, panic disorder Resume home medication     Hyperlipidemia Continue with statin.   History of idiopathic intracranial hypertension Continue with Diamox 3 times daily     Body mass index is 74.48 kg/m. Morbid obesity.      Physical deconditioning Suspect not going to be a CIR candidate (but they are following) and body habitus precludes SNF unable to go to LTAC due to lack of insurance.  Therapy eval recommending SNF.       Estimated body mass index is 74.48 kg/m as calculated from the following:   Height as of this encounter: 5\' 6"  (1.676 m).   Weight as of this encounter: 209.3 kg.    Medications     (feeding supplement) PROSource Plus  30 mL Oral TID BM   sodium chloride   Intravenous Once   sodium chloride   Intravenous Once   acetaminophen  1,000 mg Oral Q6H   acetaZOLAMIDE  500 mg Oral TID   alteplase  2 mg Intracatheter Once   amLODipine  10 mg Oral Daily   ascorbic acid  250 mg Oral Daily   Chlorhexidine Gluconate Cloth  6 each Topical Q0600   docusate sodium  100 mg Oral BID   famotidine  20 mg Oral BID   feeding supplement  237 mL Oral BID BM   ferrous sulfate  325 mg Oral BID WC   heparin  7,500 Units Subcutaneous Q8H   hydrALAZINE  25 mg Oral Q6H   insulin aspart  0-15 Units Subcutaneous TID AC & HS   insulin aspart  4 Units Subcutaneous TID WC   insulin glargine-yfgn  30 Units Subcutaneous Daily   levothyroxine  75 mcg Oral Q0600   nutrition supplement (JUVEN)  1 packet Oral BID BM   mouth rinse  15 mL Mouth Rinse 4 times per day   pravastatin  10 mg Oral q1800   QUEtiapine  75 mg Oral QHS   sodium chloride flush  10-40 mL Intracatheter Q12H   Zinc Oxide   Topical TID     Data Reviewed:   CBG:  Recent Labs  Lab 11/22/22 0648 11/22/22 1221 11/22/22 1620 11/22/22 2019 11/23/22 0750  GLUCAP 193* 220* 141*  181* 184*    SpO2: 97 % O2 Flow Rate (L/min): 4 L/min FiO2 (%): 40 %    Vitals:   11/22/22 1921 11/22/22 2237 11/23/22 0453 11/23/22 0749  BP:  132/75 (!) 158/63 (!) 146/62  Pulse:  (!) 108 (!) 105 98  Resp: 18 20 20    Temp:  98 F (36.7 C) 98.3 F (36.8 C) 97.8 F (36.6 C)  TempSrc:  Oral Oral Oral  SpO2:  95% 99% 97%  Weight:      Height:          Data Reviewed:  Basic Metabolic Panel: Recent Labs  Lab 11/17/22 0437 11/18/22 0410 11/18/22 2301 11/19/22 1824 11/20/22 0500 11/21/22 0342 11/22/22 0358  NA 137 138  --  137 139 137 142  K 3.3* 2.9* 3.1* 3.3* 3.5 3.0* 3.6  CL 106 108  --  108 110 108 113*  CO2 22 23  --  21* 19* 21* 21*  GLUCOSE 162* 192*  --  141* 188* 188* 227*  BUN 19 18  --  16 17 20  21*  CREATININE 1.41* 1.05*  --  1.05* 1.21* 1.33* 1.05*  CALCIUM 8.1* 8.1*  --  8.1* 8.1* 8.1* 8.4*  MG 2.0 2.0  --  1.8  --  2.0  --     CBC: Recent Labs  Lab 11/17/22 0437 11/18/22 0410 11/18/22 2301 11/21/22 0342 11/22/22 0358  WBC 10.3 8.5 9.6 7.7 7.0  NEUTROABS 7.6 5.6 6.4 4.6 4.6  HGB 7.6* 7.5* 7.6* 7.5* 7.6*  HCT 25.1* 24.5* 25.6* 24.9* 25.4*  MCV 96.5 96.1 97.7 96.9 97.7  PLT 251 260 257 271 266    LFT No results for input(s): "AST", "ALT", "ALKPHOS", "BILITOT", "PROT", "ALBUMIN" in the last 168 hours.       Antibiotics: Anti-infectives (From admission, onward)    Start     Dose/Rate Route Frequency Ordered Stop   11/10/22 1600  Ampicillin-Sulbactam (UNASYN) 3 g in sodium chloride 0.9 % 100 mL IVPB        3 g 200 mL/hr over 30 Minutes Intravenous Every 6 hours 11/10/22 1438 11/13/22 2323   11/10/22 1400  Ampicillin-Sulbactam (UNASYN) 3 g in sodium chloride 0.9 % 100 mL IVPB  Status:  Discontinued        3 g 200 mL/hr over 30 Minutes Intravenous Every 6 hours 11/10/22 1113 11/10/22 1438   11/20/22 1630  Ampicillin-Sulbactam (UNASYN) 3 g in sodium chloride 0.9 % 100 mL IVPB  Status:  Discontinued        3 g 200 mL/hr over 30  Minutes Intravenous Every 6 hours 11/20/2022 1051 11/10/22 1113   11/02/2022 1600  piperacillin-tazobactam (ZOSYN) IVPB 3.375 g  Status:  Discontinued        3.375 g 12.5 mL/hr over 240 Minutes Intravenous Every 8 hours 11/10/2022 1459 2022/11/20 1031   11/02/22 1400  linezolid (ZYVOX) IVPB 600 mg  Status:  Discontinued        600 mg 300 mL/hr over 60 Minutes Intravenous Every 12 hours 11/02/22 1222 11/04/22 0830   11/02/22 1000  vancomycin (VANCOREADY) IVPB 1250 mg/250 mL  Status:  Discontinued        1,250 mg 166.7 mL/hr over 90 Minutes Intravenous Every 12 hours 11/02/2022 2135 11/14/2022 2137   11/02/22 1000  vancomycin (VANCOREADY) IVPB 1500 mg/300 mL  Status:  Discontinued        1,500 mg 150 mL/hr over 120 Minutes Intravenous Every 12 hours 11/18/2022 2137 11/02/22 1222   11/02/22 0300  ceFEPIme (MAXIPIME) 2 g in sodium chloride 0.9 % 100 mL IVPB  Status:  Discontinued        2 g 200 mL/hr over 30 Minutes Intravenous Every 8 hours 10/25/2022 2135 11/18/2022 2258   11/02/22 0045  vancomycin (VANCOREADY) IVPB 2000 mg/400 mL        2,000 mg 200 mL/hr over 120 Minutes Intravenous  Once 11/02/22 0030 11/02/22 0305   11/10/2022 2315  piperacillin-tazobactam (ZOSYN) IVPB 3.375 g  Status:  Discontinued        3.375 g 12.5 mL/hr over 240 Minutes Intravenous Every 8 hours 10/27/2022 2306 10/30/2022 1459   10/31/2022 2315  clindamycin (CLEOCIN) IVPB 900 mg  Status:  Discontinued        900 mg 100 mL/hr over 30 Minutes Intravenous Every 8 hours 11/12/2022 2306 11/02/22 1222   11/11/2022 1930  vancomycin (VANCOCIN) IVPB 1000 mg/200 mL premix  Status:  Discontinued       See Hyperspace for full Linked Orders Report.   1,000 mg 200 mL/hr over 60 Minutes Intravenous  Once 11/19/2022 1823 11/02/22 0029   10/27/2022 1830  ceFEPIme (MAXIPIME) 2 g in sodium chloride 0.9 % 100 mL IVPB        2  g 200 mL/hr over 30 Minutes Intravenous  Once 11/21/2022 1819 11/06/2022 2016   11/13/2022 1830  metroNIDAZOLE (FLAGYL) IVPB 500 mg        500  mg 100 mL/hr over 60 Minutes Intravenous  Once 10/29/2022 1819 11/21/2022 2130   11/06/2022 1830  vancomycin (VANCOCIN) IVPB 1000 mg/200 mL premix  Status:  Discontinued        1,000 mg 200 mL/hr over 60 Minutes Intravenous  Once 11/19/2022 1819 10/30/2022 1823   11/19/2022 1830  vancomycin (VANCOCIN) IVPB 1000 mg/200 mL premix  Status:  Discontinued       See Hyperspace for full Linked Orders Report.   1,000 mg 200 mL/hr over 60 Minutes Intravenous  Once 11/08/2022 1823 11/02/22 0029          DVT prophylaxis:   Code Status: Full code  Family Communication: Discussed with patient's mother at bedside  CONSULTS:    Objective    Physical Examination:   General-somnolent but arousable Heart-S1-S2, regular, no murmur auscultated Lungs-clear to auscultation bilaterally, no wheezing or crackles auscultated Abdomen-soft, nontender, no organomegaly Extremities-no edema in the lower extremities Neuro-somnolent but arousable   Status is: Inpatient: Metabolic encephalopathy           [place]   [facility] If 7PM-7AM, please contact night-coverage at [email or web address], Office  [phone]   11/23/2022, 9:23 AM  LOS: 22 days

## 2022-11-23 NOTE — Progress Notes (Signed)
Occupational Therapy Treatment Patient Details Name: Lindsey [name] MRN: [id number] DOB: 05-12-1986 Today's Date: 11/23/2022   History of present illness 37 year old morbidly obese female who presented 11/01/22 with generalized weakness, shortness of breath and worsening buttock wounds for about a month. +afib with RVR; sepsis; to OR 1/10, 1/11, 1/13, 11-20-2022 for necrotizing fascitis; post-op remained on vent, sedated with propofol, levophed; extubated 1/19;  PMH-diabetes , morbid obesity, pseudotumor cerebri, vision disturbance, idiopathic intracranial hypertension, HTN, panic disorder with agoraphobia.   OT comments  Patient received in supine and agreeable to OT/PT treatment. Patient seen to address tilting in bed to address OOB tolerance. Patient demonstrated left leaning in bed and required mod assist and verbal cues to correct. Patient was able to tolerate 16 degrees of bed tilt and 58 degrees for HOB. Patient stated she felt fearful while up in tilted position. Patient is motivated towards getting OOB but is limited to weakness and pain. BP while up in tilt position 159/59. Acute OT to continue to follow.    Recommendations for follow up therapy are one component of a multi-disciplinary discharge planning process, led by the attending physician.  Recommendations may be updated based on patient status, additional functional criteria and insurance authorization.    Follow Up Recommendations  Skilled nursing-short term rehab (<3 hours/day)     Assistance Recommended at Discharge Frequent or constant Supervision/Assistance  Patient can return home with the following  Help with stairs or ramp for entrance;Assist for transportation;Assistance with cooking/housework;Two people to help with walking and/or transfers;Two people to help with bathing/dressing/bathroom   Equipment Recommendations  Wheelchair (measurements OT);Wheelchair cushion (measurements OT)    Recommendations for Other Services       Precautions / Restrictions Precautions Precautions: Fall Precaution Comments: Rectal Tube, watch O2 and HR Restrictions Weight Bearing Restrictions: No       Mobility Bed Mobility Overal bed mobility: Needs Assistance Bed Mobility: Rolling Rolling: Mod assist         General bed mobility comments: cues and education on positioning self in bed. Patient often moving off right buttocks    Transfers                   General transfer comment: performed tilting in bed to address progressing with transfers     Balance Overall balance assessment: Needs assistance Sitting-balance support: Single extremity supported, Bilateral upper extremity supported, Feet supported, Feet unsupported Sitting balance-Leahy Scale: Poor Sitting balance - Comments: pulled self into sitting position with assistance of 2 to maintain Postural control: Left lateral lean                                 ADL either performed or assessed with clinical judgement   ADL Overall ADL's : Needs assistance/impaired                                       General ADL Comments: focused on bed mobility and tilting in bed    Extremity/Trunk Assessment              Vision       Perception     Praxis      Cognition Arousal/Alertness: Awake/alert Behavior During Therapy: Flat affect Overall Cognitive Status: Impaired/Different from baseline Area of Impairment: Safety/judgement, Awareness, Problem solving, Following commands  Following Commands: Follows one step commands inconsistently, Follows one step commands with increased time Safety/Judgement: Decreased awareness of deficits, Decreased awareness of safety   Problem Solving: Slow processing, Decreased initiation, Difficulty sequencing, Requires verbal cues, Requires tactile cues General Comments: unaware of deficits        Exercises      Shoulder Instructions        General Comments performed tilting in bed with patient tolerating 16 degree bed tilt and 58 degrees HOB. Patient stated she felt fearful at 16 degrees    Pertinent Vitals/ Pain       Pain Assessment Pain Assessment: Faces Faces Pain Scale: Hurts little more Pain Location: bottom, right buttocks Pain Descriptors / Indicators: Discomfort, Grimacing Pain Intervention(s): Limited activity within patient's tolerance, Monitored during session, Repositioned  Home Living                                          Prior Functioning/Environment              Frequency  Min 2X/week        Progress Toward Goals  OT Goals(current goals can now be found in the care plan section)  Progress towards OT goals: Progressing toward goals  Acute Rehab OT Goals Patient Stated Goal: get OOB Time For Goal Achievement: 11/28/22 Potential to Achieve Goals: Good ADL Goals Pt Will Perform Grooming: with set-up;sitting Pt Will Perform Upper Body Bathing: with min assist;sitting Pt Will Perform Upper Body Dressing: with min assist;sitting Pt/caregiver will Perform Home Exercise Program: Increased strength;Both right and left upper extremity;With Supervision Additional ADL Goal #1: Roll side to side with use of side rails and Mod A to increase ind with toileting. Additional ADL Goal #2: Supine to sit with Mod A of 2 for safety to increase ind with upper body ADL  Plan Discharge plan remains appropriate    Co-evaluation    PT/OT/SLP Co-Evaluation/Treatment: Yes Reason for Co-Treatment: For patient/therapist safety;To address functional/ADL transfers   OT goals addressed during session: Strengthening/ROM      AM-PAC OT "6 Clicks" Daily Activity     Outcome Measure   Help from another person eating meals?: A Little Help from another person taking care of personal grooming?: A Little Help from another person toileting, which includes using toliet, bedpan, or urinal?:  Total Help from another person bathing (including washing, rinsing, drying)?: A Lot Help from another person to put on and taking off regular upper body clothing?: A Lot Help from another person to put on and taking off regular lower body clothing?: Total 6 Click Score: 12    End of Session Equipment Utilized During Treatment: Oxygen  OT Visit Diagnosis: Muscle weakness (generalized) (M62.81)   Activity Tolerance Patient tolerated treatment well   Patient Left in bed;with call bell/phone within reach   Nurse Communication Other (comment) (tilt bed tolerance)        Time: 1017-5102 OT Time Calculation (min): 44 min  Charges: OT General Charges $OT Visit: 1 Visit OT Treatments $Therapeutic Activity: 23-37 mins  Lodema Hong, [place]  Office [place] 11/23/2022, 2:59 PM

## 2022-11-23 NOTE — Plan of Care (Signed)
  Problem: Education: Goal: Ability to describe self-care measures that may prevent or decrease complications (Diabetes Survival Skills Education) will improve Outcome: Not Progressing Goal: Individualized Educational Video(s) Outcome: Not Progressing   Problem: Coping: Goal: Ability to adjust to condition or change in health will improve Outcome: Not Progressing   Problem: Fluid Volume: Goal: Ability to maintain a balanced intake and output will improve Outcome: Not Progressing   Problem: Health Behavior/Discharge Planning: Goal: Ability to identify and utilize available resources and services will improve Outcome: Not Progressing Goal: Ability to manage health-related needs will improve Outcome: Not Progressing   Problem: Metabolic: Goal: Ability to maintain appropriate glucose levels will improve Outcome: Not Progressing   Problem: Nutritional: Goal: Maintenance of adequate nutrition will improve Outcome: Not Progressing Goal: Progress toward achieving an optimal weight will improve Outcome: Not Progressing   Problem: Skin Integrity: Goal: Risk for impaired skin integrity will decrease Outcome: Not Progressing   Problem: Tissue Perfusion: Goal: Adequacy of tissue perfusion will improve Outcome: Not Progressing   Problem: Education: Goal: Ability to describe self-care measures that may prevent or decrease complications (Diabetes Survival Skills Education) will improve Outcome: Not Progressing Goal: Individualized Educational Video(s) Outcome: Not Progressing   Problem: Cardiac: Goal: Ability to maintain an adequate cardiac output will improve Outcome: Not Progressing   Problem: Health Behavior/Discharge Planning: Goal: Ability to identify and utilize available resources and services will improve Outcome: Not Progressing Goal: Ability to manage health-related needs will improve Outcome: Not Progressing   Problem: Fluid Volume: Goal: Ability to achieve a  balanced intake and output will improve Outcome: Not Progressing   Problem: Metabolic: Goal: Ability to maintain appropriate glucose levels will improve Outcome: Not Progressing   Problem: Nutritional: Goal: Maintenance of adequate nutrition will improve Outcome: Not Progressing Goal: Maintenance of adequate weight for body size and type will improve Outcome: Not Progressing   Problem: Respiratory: Goal: Will regain and/or maintain adequate ventilation Outcome: Not Progressing   Problem: Urinary Elimination: Goal: Ability to achieve and maintain adequate renal perfusion and functioning will improve Outcome: Not Progressing   Problem: Education: Goal: Knowledge of General Education information will improve Description: Including pain rating scale, medication(s)/side effects and non-pharmacologic comfort measures Outcome: Not Progressing   Problem: Health Behavior/Discharge Planning: Goal: Ability to manage health-related needs will improve Outcome: Not Progressing   Problem: Clinical Measurements: Goal: Ability to maintain clinical measurements within normal limits will improve Outcome: Not Progressing Goal: Will remain free from infection Outcome: Not Progressing Goal: Diagnostic test results will improve Outcome: Not Progressing Goal: Respiratory complications will improve Outcome: Not Progressing Goal: Cardiovascular complication will be avoided Outcome: Not Progressing   Problem: Activity: Goal: Risk for activity intolerance will decrease Outcome: Not Progressing   Problem: Nutrition: Goal: Adequate nutrition will be maintained Outcome: Not Progressing   Problem: Coping: Goal: Level of anxiety will decrease Outcome: Not Progressing   Problem: Elimination: Goal: Will not experience complications related to bowel motility Outcome: Not Progressing Goal: Will not experience complications related to urinary retention Outcome: Not Progressing   Problem: Pain  Managment: Goal: General experience of comfort will improve Outcome: Not Progressing   Problem: Safety: Goal: Ability to remain free from injury will improve Outcome: Not Progressing   Problem: Skin Integrity: Goal: Risk for impaired skin integrity will decrease Outcome: Not Progressing   Problem: Safety: Goal: Non-violent Restraint(s) Outcome: Not Progressing

## 2022-11-24 LAB — GLUCOSE, CAPILLARY
Glucose-Capillary: 100 mg/dL — ABNORMAL HIGH (ref 70–99)
Glucose-Capillary: 103 mg/dL — ABNORMAL HIGH (ref 70–99)
Glucose-Capillary: 117 mg/dL — ABNORMAL HIGH (ref 70–99)
Glucose-Capillary: 121 mg/dL — ABNORMAL HIGH (ref 70–99)
Glucose-Capillary: 128 mg/dL — ABNORMAL HIGH (ref 70–99)
Glucose-Capillary: 137 mg/dL — ABNORMAL HIGH (ref 70–99)

## 2022-11-24 LAB — COMPREHENSIVE METABOLIC PANEL
ALT: 15 U/L (ref 0–44)
AST: 12 U/L — ABNORMAL LOW (ref 15–41)
Albumin: 1.7 g/dL — ABNORMAL LOW (ref 3.5–5.0)
Alkaline Phosphatase: 83 U/L (ref 38–126)
Anion gap: 11 (ref 5–15)
BUN: 14 mg/dL (ref 6–20)
CO2: 19 mmol/L — ABNORMAL LOW (ref 22–32)
Calcium: 8.1 mg/dL — ABNORMAL LOW (ref 8.9–10.3)
Chloride: 109 mmol/L (ref 98–111)
Creatinine, Ser: 0.87 mg/dL (ref 0.44–1.00)
GFR, Estimated: >60 mL/min (ref >60)
Glucose, Bld: 114 mg/dL — ABNORMAL HIGH (ref 70–99)
Potassium: 2.7 mmol/L — CL (ref 3.5–5.1)
Sodium: 139 mmol/L (ref 135–145)
Total Bilirubin: 0.3 mg/dL (ref 0.3–1.2)
Total Protein: 6.4 g/dL — ABNORMAL LOW (ref 6.5–8.1)

## 2022-11-24 LAB — CBC
HCT: 26 % — ABNORMAL LOW (ref 36.0–46.0)
Hemoglobin: 7.9 g/dL — ABNORMAL LOW (ref 12.0–15.0)
MCH: 28.5 pg (ref 26.0–34.0)
MCHC: 30.4 g/dL (ref 30.0–36.0)
MCV: 93.9 fL (ref 80.0–100.0)
Platelets: 269 10*3/uL (ref 150–400)
RBC: 2.77 MIL/uL — ABNORMAL LOW (ref 3.87–5.11)
RDW: 16.9 % — ABNORMAL HIGH (ref 11.5–15.5)
WBC: 10 10*3/uL (ref 4.0–10.5)
nRBC: 0.6 % — ABNORMAL HIGH (ref 0.0–0.2)

## 2022-11-24 MED ORDER — CALCIUM POLYCARBOPHIL 625 MG PO TABS
625.0000 mg | ORAL_TABLET | Freq: Every day | ORAL | Status: DC
  Administered 2022-11-24: 625 mg via ORAL
  Filled 2022-11-24 (×3): qty 1

## 2022-11-24 MED ORDER — MEDIHONEY WOUND/BURN DRESSING EX PSTE
1.0000 | PASTE | Freq: Every day | CUTANEOUS | Status: DC
  Administered 2022-11-24 – 2022-11-25 (×2): 1 via TOPICAL
  Filled 2022-11-24: qty 44

## 2022-11-24 MED ORDER — POTASSIUM CHLORIDE CRYS ER 20 MEQ PO TBCR
40.0000 meq | EXTENDED_RELEASE_TABLET | Freq: Three times a day (TID) | ORAL | Status: DC
  Administered 2022-11-24: 40 meq via ORAL
  Filled 2022-11-24: qty 2

## 2022-11-24 MED ORDER — POTASSIUM CHLORIDE CRYS ER 20 MEQ PO TBCR
40.0000 meq | EXTENDED_RELEASE_TABLET | Freq: Three times a day (TID) | ORAL | Status: AC
  Administered 2022-11-24 (×3): 40 meq via ORAL
  Filled 2022-11-24 (×3): qty 2

## 2022-11-24 MED ORDER — ACETAZOLAMIDE ER 500 MG PO CP12
500.0000 mg | ORAL_CAPSULE | Freq: Three times a day (TID) | ORAL | Status: DC
  Administered 2022-11-24 – 2022-11-25 (×3): 500 mg via ORAL
  Filled 2022-11-24 (×7): qty 1

## 2022-11-24 NOTE — Progress Notes (Signed)
[facility] Progress Note  17 Days Post-Op  Subjective: CC-  Ready for dressing change. WBC 10, TMAX 99.9  Objective: Vital signs in last 24 hours: Temp:  [98 F (36.7 C)-99.7 F (37.6 C)] 99.7 F (37.6 C) (02/01 0906) Pulse Rate:  [90-100] 97 (02/01 0906) Resp:  [18-20] 18 (02/01 0906) BP: (130-141)/(53-76) 138/72 (02/01 1223) SpO2:  [90 %-100 %] 100 % (02/01 0906) Last BM Date : 11/24/22  Intake/Output from previous day: 01/31 0701 - 02/01 0700 In: -  Out: 2700 [Urine:2700] Intake/Output this shift: No intake/output data recorded.  PE: Gen:  Awake and alert, NAD GU: indwelling foley in place, rectal tube in place with some leakage Wound: pictured below. Perineal and right labial wound communicating with buttock wound with mixture of granulation tissue and fibrinous tissue/exudate at the base of the wound, some fibrinous tissue trimmed off today. No purulent drainage. Wound does track several cm towards right groin - no  loculations; otherwise no significant tracking.  Periwound clean and skin breakdown seems somewhat improved today but is still painful   Lab Results:  Recent Labs    11/22/22 0358 11/24/22 0208  WBC 7.0 10.0  HGB 7.6* 7.9*  HCT 25.4* 26.0*  PLT 266 269   BMET Recent Labs    11/22/22 0358 11/24/22 0208  NA 142 139  K 3.6 2.7*  CL 113* 109  CO2 21* 19*  GLUCOSE 227* 114*  BUN 21* 14  CREATININE 1.05* 0.87  CALCIUM 8.4* 8.1*   PT/INR No results for input(s): "LABPROT", "INR" in the last 72 hours. CMP     Component Value Date/Time   NA 139 11/24/2022 0208   K 2.7 (LL) 11/24/2022 0208   CL 109 11/24/2022 0208   CO2 19 (L) 11/24/2022 0208   GLUCOSE 114 (H) 11/24/2022 0208   BUN 14 11/24/2022 0208   CREATININE 0.87 11/24/2022 0208   CALCIUM 8.1 (L) 11/24/2022 0208   PROT 6.4 (L) 11/24/2022 0208   ALBUMIN 1.7 (L) 11/24/2022 0208   AST 12 (L) 11/24/2022 0208   ALT 15 11/24/2022 0208   ALKPHOS 83 11/24/2022 0208    BILITOT 0.3 11/24/2022 0208   GFRNONAA >60 11/24/2022 0208   GFRAA >60 07/03/2016 0004   Lipase  No results found for: "LIPASE"     Studies/Results: No results found.  Anti-infectives: Anti-infectives (From admission, onward)    Start     Dose/Rate Route Frequency Ordered Stop   11/10/22 1600  Ampicillin-Sulbactam (UNASYN) 3 g in sodium chloride 0.9 % 100 mL IVPB        3 g 200 mL/hr over 30 Minutes Intravenous Every 6 hours 11/10/22 1438 11/13/22 2323   11/10/22 1400  Ampicillin-Sulbactam (UNASYN) 3 g in sodium chloride 0.9 % 100 mL IVPB  Status:  Discontinued        3 g 200 mL/hr over 30 Minutes Intravenous Every 6 hours 11/10/22 1113 11/10/22 1438   11/09/2022 1630  Ampicillin-Sulbactam (UNASYN) 3 g in sodium chloride 0.9 % 100 mL IVPB  Status:  Discontinued        3 g 200 mL/hr over 30 Minutes Intravenous Every 6 hours 11/13/2022 1051 11/10/22 1113   11/14/2022 1600  piperacillin-tazobactam (ZOSYN) IVPB 3.375 g  Status:  Discontinued        3.375 g 12.5 mL/hr over 240 Minutes Intravenous Every 8 hours 10/28/2022 1459 11/12/2022 1031   11/02/22 1400  linezolid (ZYVOX) IVPB 600 mg  Status:  Discontinued  600 mg 300 mL/hr over 60 Minutes Intravenous Every 12 hours 11/02/22 1222 11/04/22 0830   11/02/22 1000  vancomycin (VANCOREADY) IVPB 1250 mg/250 mL  Status:  Discontinued        1,250 mg 166.7 mL/hr over 90 Minutes Intravenous Every 12 hours 10/28/2022 2135 11/23/2022 2137   11/02/22 1000  vancomycin (VANCOREADY) IVPB 1500 mg/300 mL  Status:  Discontinued        1,500 mg 150 mL/hr over 120 Minutes Intravenous Every 12 hours 11/21/2022 2137 11/02/22 1222   11/02/22 0300  ceFEPIme (MAXIPIME) 2 g in sodium chloride 0.9 % 100 mL IVPB  Status:  Discontinued        2 g 200 mL/hr over 30 Minutes Intravenous Every 8 hours 11/23/2022 2135 11/08/2022 2258   11/02/22 0045  vancomycin (VANCOREADY) IVPB 2000 mg/400 mL        2,000 mg 200 mL/hr over 120 Minutes Intravenous  Once 11/02/22 0030  11/02/22 0305   11/08/2022 2315  piperacillin-tazobactam (ZOSYN) IVPB 3.375 g  Status:  Discontinued        3.375 g 12.5 mL/hr over 240 Minutes Intravenous Every 8 hours 11/18/2022 2306 11/10/2022 1459   11/02/2022 2315  clindamycin (CLEOCIN) IVPB 900 mg  Status:  Discontinued        900 mg 100 mL/hr over 30 Minutes Intravenous Every 8 hours 11/14/2022 2306 11/02/22 1222   11/14/2022 1930  vancomycin (VANCOCIN) IVPB 1000 mg/200 mL premix  Status:  Discontinued       See Hyperspace for full Linked Orders Report.   1,000 mg 200 mL/hr over 60 Minutes Intravenous  Once 10/29/2022 1823 11/02/22 0029   10/24/2022 1830  ceFEPIme (MAXIPIME) 2 g in sodium chloride 0.9 % 100 mL IVPB        2 g 200 mL/hr over 30 Minutes Intravenous  Once 11/12/2022 1819 11/20/2022 2016   10/27/2022 1830  metroNIDAZOLE (FLAGYL) IVPB 500 mg        500 mg 100 mL/hr over 60 Minutes Intravenous  Once 10/31/2022 1819 10/25/2022 2130   10/26/2022 1830  vancomycin (VANCOCIN) IVPB 1000 mg/200 mL premix  Status:  Discontinued        1,000 mg 200 mL/hr over 60 Minutes Intravenous  Once 10/25/2022 1819 10/25/2022 1823   11/08/2022 1830  vancomycin (VANCOCIN) IVPB 1000 mg/200 mL premix  Status:  Discontinued       See Hyperspace for full Linked Orders Report.   1,000 mg 200 mL/hr over 60 Minutes Intravenous  Once 11/19/2022 1823 11/02/22 0029        Assessment/Plan S/p debridement of buttock NSTI  11/09/2022 Dr. Rosendo Gros  S/p excisional debridement buttock and perineal wound 11/23/2022 Dr. Donne Hazel S/p excisional debridement perineal wound 11/10/2022 Dr. Rosendo Gros S/P excisional debridement perineal wound and buttock wound 11/20/2022 Dr. Ninfa Linden - No further plans to return to the OR at this time. Cx's with GBS, strep mitis/oralis, Prevotella bivia. Completed abx. WBC normalized and pt afebrile - Patient did well with no pain medication prior to dressing change, she declined it.  - Continue daily dressing changes with normal saline, or PRN for soilage. will add  medi-honey to areas of necrotic tissue. Surgery will plan to evaluate wounds again on Monday 11/28/22, and if it remains stable we will follow once weekly there after. - Continue destin (triple paste) to periwound and interdry in left inguinal fold and under right buttock to help wick moisture and prevent further skin breakdown. - Ideally this patient would get a diverting colostomy  to keep stool out of her wound but I think her body habitus precludes this.    FEN - carb mod VTE - SQH ID - Clindamycin, Zosyn, Vanc; narrowed to unasyn 1/15>>1/21   - below per CCM -  Septic shock - resolved ARF - improved DM  Morbid obesity - BMI 79 Hypothryoidism PCOS HLD Anxiety/Depression    LOS: 23 days    Wellington Hampshire, PA-C [facility] 11/24/2022, 3:00 PM Please see Amion for pager number during day hours 7:00am-4:30pm

## 2022-11-24 NOTE — Progress Notes (Signed)
Nutrition Follow-up  DOCUMENTATION CODES:   Morbid obesity  INTERVENTION:  Ensure Enlive po BID, each supplement provides 350 kcal and 20 grams of protein. 30 ml ProSource Plus TID, each supplement provides 100 kcals and 15 grams protein.  Juven BID to support wound healing Continue Vitamin C 250mg  daily  NUTRITION DIAGNOSIS:  Increased nutrient needs related to wound healing as evidenced by estimated needs. - ongoing  GOAL:  Patient will meet greater than or equal to 90% of their needs -goal unmet, continues progressing  MONITOR:  PO intake, Supplement acceptance, Labs, Weight trends, Skin  REASON FOR ASSESSMENT:  Ventilator    ASSESSMENT:   Pt with hx of DM type 2, HTN, HLD and hypothyroidism presented to ED with worsening pain from known boils on her buttocks. Imaging in ED showed large gas containing infection of the right gluteus.  1/10 - Op, I&D, returned to OR on vent 1/11 - Op, I&D    1/15 - Op I&D, Cortrak placed (tip in gastric antrum)   1/19 - extubated 1/20 - diet advanced to carb modified  02/01 - ST downgraded to thickened liquids  Surgery performed bedside dressing change today. Plans to add medihoney to necrotic tissue and will likely start following once weekly.    SLP consulted d/t pt having difficulty with thin liquids. Noted absence of coughing with thickened liquids, plans for nectar thick liquids at this time and will determine need for instrumental swallow study upon follow up.   Spoke with RN who reports pt eating well overall. Per meal documentation, average meal completions at 71% x 8 recorded meals. Ate 95% of breakfast this morning.  Accepting most Ensure, Juven and Prosource supplements. Will continue to monitor acceptance.   Edema: mild pitting generalized, BUE, moderate pitting BLE  Medications: diamox, Vitamin C 250mg  daily, colace, pepcid, ferrous sulfate, SSI 0-15 units TID, SSI 4 units TID, semglee 30 units daily, medihoney,  klor-con  Labs: potassium 2.7, AST 12, CBG's 100-137 x24 hours  UOP: 2.7L x24 hours I/O's: -20.1L since 01/18  Diet Order:   Diet Order             Diet Carb Modified Fluid consistency: Nectar Thick; Room service appropriate? Yes  Diet effective now                   EDUCATION NEEDS:   Not appropriate for education at this time  Skin:  Skin Assessment: Reviewed RN Assessment (fournier's gangrene of perineum and buttocks)  Last BM:  2/1 (type 7) via FMS- large  Height:   Ht Readings from Last 1 Encounters:  11/15/2022 5\' 6"  (1.676 m)    Weight:   Wt Readings from Last 1 Encounters:  11/21/22 (!) 209.3 kg    Ideal Body Weight:  59.1 kg  BMI:  Body mass index is 74.48 kg/m.  Estimated Nutritional Needs:   Kcal:  2700-3000 kcal/d  Protein:  135-150 g/d  Fluid:  2.2-2.5L/d  Clayborne Dana, RDN, LDN Clinical Nutrition

## 2022-11-24 NOTE — Progress Notes (Signed)
Date and time results received: 11/24/22 0400  Test: potassium Critical Value: 2.7  Name of Provider Notified: Raenette Rover NP  Orders Received? New orders  Actions Taken?: Administered

## 2022-11-24 NOTE — Progress Notes (Signed)
Pt refused CPAP for nighttime use. 

## 2022-11-24 NOTE — Progress Notes (Addendum)
[facility]  PROGRESS NOTE  Dennys Traughber FAO:[id number] DOB: 30-Mar-1986 DOA: 10/30/2022 PCP: Lawerance Cruel, MD  Brief hospital course  [name] is a 37 yo female with PMH DMII, morbid obesity, probable OHS, depression/anxiety, HLD, HTN, hypothyroidism, PCOS, pseudotumor cerebri, PTSD who presented with worsening buttocks wounds.  Initially started around Thanksgiving and essentially had slowly progressively worsened prompting presentation to the ER. CT abdomen/pelvis performed and was consistent with Fournier's gangrene involving the perineum on the right and medial/posteromedial aspects of the right gluteal region. Significant Hospital Events:  CT abdomen pelvis 1/9 > basilar atelectasis, normal stomach, appendix, bowel wall, no hernia, mild to moderate subcutaneous impaired muscular inflammatory fat stranding anterolateral aspect abdominal wall on the left, no ascites.  Soft tissue gas along the perineum on the right with medial and posterior medial right gluteal involvement consistent with Fournier's gangrene OR for debridement 1/9, 1/11, 09-Nov-2022 Blood cultures 1/9 > negative x  days Urine culture 1/9 > yeast 70 K Right buttock wound culture 1/10 > GPC, GNR > abundant group B strep, abundant strep mitis, Prevotella PICC placed 1/12 Extubated 1/19 to BiPAP      Subjective   Patient seen and examined, mental status has significantly improved.     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 -Wound culture grew group B strep, Streptococcus mitis, Prevotella -She underwent debridement on 1/ 9, 1/11, Nov 09, 2022, 1/15; completed a course of Unasyn -General surgery and wound care on board -Getting daily dressing changes with normal saline -Has Flexi-Seal in place -Continue pain control -Completed antibiotics in the hospital -Dose of oxycodone will be changed to 5 to 10 mg every 4 hours as needed; discontinue oxycodone 20 mg due to  sedation -Dose of Dilaudid changed 2.5 mg as needed for dressing changes  Acute metabolic encephalopathy -Likely multifactorial from underlying OHS, sedation from opioids and medications -Significantly improved -ABG obtained shows pH 7.29, pCO2 44, pO2 81; consistent with chronic respiratory acidosis and metabolic acidosis.    -Also dose of oxycodone 20 mg every 4 hours as needed has been discontinued, continue oxycodone 5 to 10 mg every 4 hours as needed -Gabapentin was discontinued - Robaxin was changed from scheduled to to 1000 mg every 8 hours as needed -Neurology was consulted; felt likely from opioids -Recommend outpatient EMG/NCS  Acute respiratory failure in setting of OHS -Patient not using CPAP at that time for past 4 days -Currently O2 sats 97% on room air -Chest x-ray showed low lung volumes with bronchovascular crowding.  Persistent bibasilar patchy opacities likely atelectasis.  Cardiomegaly with prominent bilateral perihilar vascular congestion. -Echocardiogram shows Left ventricular ejection fraction, 60 to 65% with normal function. Left ventricular endocardial border  not optimally defined to evaluate regional wall motion. Left ventricular  diastolic parameters are indeterminate.  -Encourage incentive spirometry.   Diabetes mellitus type 2 -Last hemoglobin A1c 14.7 -CBGs have been fairly well-controlled -Continue sliding scale insulin with NovoLog, meal coverage with NovoLog 4 units 3 times daily -Continue Semglee 30 units subcu daily  Acute kidney injury -Likely from poor p.o. intake -Creatinine fluctuating from 1-1.33 -Avoid nephrotoxic medications  Anemia from blood loss -Hemoglobin stable at 7.9 -Transfuse to keep Hb greater than 7  Hypertension -Patient is currently on amlodipine/hydralazine -Blood pressure well-controlled  Hypokalemia -Potassium was 2.7 today -Will replace potassium and follow BMP in  am  Hypernatremia -Resolved  Hypothyroidism Continue with levothyroxine       History of anxiety, depression, PTSD, panic disorder  Resume home medication     Hyperlipidemia Continue with statin.   History of idiopathic intracranial hypertension Continue with Diamox 3 times daily     Body mass index is 74.48 kg/m. Morbid obesity.      Physical deconditioning Suspect not going to be a CIR candidate (but they are following) and body habitus precludes SNF unable to go to LTAC due to lack of insurance.  Therapy eval recommending SNF.       Estimated body mass index is 74.48 kg/m as calculated from the following:   Height as of this encounter: 5\' 6"  (1.676 m).   Weight as of this encounter: 209.3 kg.    Medications     (feeding supplement) PROSource Plus  30 mL Oral TID BM   sodium chloride   Intravenous Once   sodium chloride   Intravenous Once   acetaminophen  1,000 mg Oral Q6H   alteplase  2 mg Intracatheter Once   amLODipine  10 mg Oral Daily   ascorbic acid  250 mg Oral Daily   Chlorhexidine Gluconate Cloth  6 each Topical Q0600   docusate sodium  100 mg Oral BID   famotidine  20 mg Oral BID   feeding supplement  237 mL Oral BID BM   ferrous sulfate  325 mg Oral BID WC   heparin  7,500 Units Subcutaneous Q8H   hydrALAZINE  25 mg Oral Q6H   insulin aspart  0-15 Units Subcutaneous TID AC & HS   insulin aspart  4 Units Subcutaneous TID WC   insulin glargine-yfgn  30 Units Subcutaneous Daily   levothyroxine  75 mcg Oral Q0600   nutrition supplement (JUVEN)  1 packet Oral BID BM   mouth rinse  15 mL Mouth Rinse 4 times per day   potassium chloride  40 mEq Oral TID   pravastatin  10 mg Oral q1800   QUEtiapine  75 mg Oral QHS   sodium chloride flush  10-40 mL Intracatheter Q12H   Zinc Oxide   Topical TID     Data Reviewed:   CBG:  Recent Labs  Lab 11/23/22 1640 11/23/22 1946 11/24/22 0008 11/24/22 0427 11/24/22 0822  GLUCAP 126* 114* 117* 100*  128*    SpO2: 100 % O2 Flow Rate (L/min): 3 L/min FiO2 (%): 40 %    Vitals:   11/23/22 1450 11/23/22 1706 11/23/22 2228 11/24/22 0543  BP: (!) 159/59 130/76 135/74 (!) 141/65  Pulse: 100 94 90 100  Resp:  20 19 20   Temp:   98.2 F (36.8 C) 98 F (36.7 C)  TempSrc:   Oral Oral  SpO2:  90% 100% 100%  Weight:      Height:          Data Reviewed:  Basic Metabolic Panel: Recent Labs  Lab 11/18/22 0410 11/18/22 2301 11/19/22 1824 11/20/22 0500 11/21/22 0342 11/22/22 0358 11/24/22 0208  NA 138  --  137 139 137 142 139  K 2.9*   < > 3.3* 3.5 3.0* 3.6 2.7*  CL 108  --  108 110 108 113* 109  CO2 23  --  21* 19* 21* 21* 19*  GLUCOSE 192*  --  141* 188* 188* 227* 114*  BUN 18  --  16 17 20  21* 14  CREATININE 1.05*  --  1.05* 1.21* 1.33* 1.05* 0.87  CALCIUM 8.1*  --  8.1* 8.1* 8.1* 8.4* 8.1*  MG 2.0  --  1.8  --  2.0  --   --    < > =  values in this interval not displayed.    CBC: Recent Labs  Lab 11/18/22 0410 11/18/22 2301 11/21/22 0342 11/22/22 0358 11/24/22 0208  WBC 8.5 9.6 7.7 7.0 10.0  NEUTROABS 5.6 6.4 4.6 4.6  --   HGB 7.5* 7.6* 7.5* 7.6* 7.9*  HCT 24.5* 25.6* 24.9* 25.4* 26.0*  MCV 96.1 97.7 96.9 97.7 93.9  PLT 260 257 271 266 269    LFT Recent Labs  Lab 11/24/22 0208  AST 12*  ALT 15  ALKPHOS 83  BILITOT 0.3  PROT 6.4*  ALBUMIN 1.7*         Antibiotics: Anti-infectives (From admission, onward)    Start     Dose/Rate Route Frequency Ordered Stop   11/10/22 1600  Ampicillin-Sulbactam (UNASYN) 3 g in sodium chloride 0.9 % 100 mL IVPB        3 g 200 mL/hr over 30 Minutes Intravenous Every 6 hours 11/10/22 1438 11/13/22 2323   11/10/22 1400  Ampicillin-Sulbactam (UNASYN) 3 g in sodium chloride 0.9 % 100 mL IVPB  Status:  Discontinued        3 g 200 mL/hr over 30 Minutes Intravenous Every 6 hours 11/10/22 1113 11/10/22 1438   10/27/2022 1630  Ampicillin-Sulbactam (UNASYN) 3 g in sodium chloride 0.9 % 100 mL IVPB  Status:  Discontinued         3 g 200 mL/hr over 30 Minutes Intravenous Every 6 hours 10/28/2022 1051 11/10/22 1113   11/15/2022 1600  piperacillin-tazobactam (ZOSYN) IVPB 3.375 g  Status:  Discontinued        3.375 g 12.5 mL/hr over 240 Minutes Intravenous Every 8 hours 11/02/2022 1459 10/28/2022 1031   11/02/22 1400  linezolid (ZYVOX) IVPB 600 mg  Status:  Discontinued        600 mg 300 mL/hr over 60 Minutes Intravenous Every 12 hours 11/02/22 1222 11/04/22 0830   11/02/22 1000  vancomycin (VANCOREADY) IVPB 1250 mg/250 mL  Status:  Discontinued        1,250 mg 166.7 mL/hr over 90 Minutes Intravenous Every 12 hours 10/24/2022 2135 10/24/2022 2137   11/02/22 1000  vancomycin (VANCOREADY) IVPB 1500 mg/300 mL  Status:  Discontinued        1,500 mg 150 mL/hr over 120 Minutes Intravenous Every 12 hours 11/06/2022 2137 11/02/22 1222   11/02/22 0300  ceFEPIme (MAXIPIME) 2 g in sodium chloride 0.9 % 100 mL IVPB  Status:  Discontinued        2 g 200 mL/hr over 30 Minutes Intravenous Every 8 hours 11/11/2022 2135 11/17/2022 2258   11/02/22 0045  vancomycin (VANCOREADY) IVPB 2000 mg/400 mL        2,000 mg 200 mL/hr over 120 Minutes Intravenous  Once 11/02/22 0030 11/02/22 0305   11/17/2022 2315  piperacillin-tazobactam (ZOSYN) IVPB 3.375 g  Status:  Discontinued        3.375 g 12.5 mL/hr over 240 Minutes Intravenous Every 8 hours 10/30/2022 2306 10/30/2022 1459   11/23/2022 2315  clindamycin (CLEOCIN) IVPB 900 mg  Status:  Discontinued        900 mg 100 mL/hr over 30 Minutes Intravenous Every 8 hours 11/04/2022 2306 11/02/22 1222   10/31/2022 1930  vancomycin (VANCOCIN) IVPB 1000 mg/200 mL premix  Status:  Discontinued       See Hyperspace for full Linked Orders Report.   1,000 mg 200 mL/hr over 60 Minutes Intravenous  Once 11/20/2022 1823 11/02/22 0029   11/13/2022 1830  ceFEPIme (MAXIPIME) 2 g in sodium chloride 0.9 %  100 mL IVPB        2 g 200 mL/hr over 30 Minutes Intravenous  Once Nov 14, 2022 1819 November 14, 2022 2016   Nov 14, 2022 1830  metroNIDAZOLE  (FLAGYL) IVPB 500 mg        500 mg 100 mL/hr over 60 Minutes Intravenous  Once November 14, 2022 1819 November 14, 2022 2130   11/14/2022 1830  vancomycin (VANCOCIN) IVPB 1000 mg/200 mL premix  Status:  Discontinued        1,000 mg 200 mL/hr over 60 Minutes Intravenous  Once 2022/11/14 1819 Nov 14, 2022 1823   11-14-22 1830  vancomycin (VANCOCIN) IVPB 1000 mg/200 mL premix  Status:  Discontinued       See Hyperspace for full Linked Orders Report.   1,000 mg 200 mL/hr over 60 Minutes Intravenous  Once 2022/11/14 1823 11/02/22 0029          DVT prophylaxis:   Code Status: Full code  Family Communication: Discussed with patient's mother at bedside  CONSULTS:    Objective    Physical Examination:   Appears in no acute distress S1-S2, regular, no murmur auscultated Lungs clear to auscultation bilaterally Neuro/psych-moving all extremities, she is alert, oriented x 3, answering questions appropriately   Status is: Inpatient: Metabolic encephalopathy           [place]   [facility] If 7PM-7AM, please contact night-coverage at [email or web address], Office  [phone]   11/24/2022, 8:42 AM  LOS: 23 days

## 2022-11-24 NOTE — Progress Notes (Addendum)
Patient found by nurse tech attempting to get out of bed. Patient was sitting on the edge of the bed dangling both feet on the floor and not wearing her oxygen. 5 staff members assisted the patient back in the bed and RN explained that PT needed to work with her prior to just getting out of bed. Patient states that she has not been out of bed in "3 weeks" and staff explained that this is why she needs to work with therapy before getting up with staff. In patient's attempt to get up, her flexiseal was removed. Spoke with surgical NP's and will trial keeping it out and start fiber tablets to bulk stool. Patient states understanding and is agreeable to wait and work with therapy tomorrow.

## 2022-11-24 NOTE — Progress Notes (Signed)
Speech Language Pathology Treatment: Dysphagia  Patient Details Name: Lindsey [name] MRN: [id number] DOB: [birth year] Today's Date: 11/24/2022 Time: 2263-3354 SLP Time Calculation (min) (ACUTE ONLY): 22 min  Assessment / Plan / Recommendation Clinical Impression    HPI Lindsey [name] was seen for dysphagia f/u. Her mother was preparing to leave upon clinician's arrival. She reported good intake of breakfast without difficulty eating.  RN present to offer meds. Pt demonstrated coughing after 100% of thin liquid trials - meds were given in ice cream without difficulty; f/u sips of liquid continued to elicit coughing.  Positioning is a challenge - bed was tilted and pt's head was propped up, however, regardless of position and liquid bolus size, coughing persisted.  Breath sounds have been clear per chart review.    Returned later this am with thickener packets and nectar juice.  Pt accepted sips of thickened liquid - there was an absence of coughing over several trials.  Recommend thickening liquids to nectar for now. D/W RN.  Will determine if instrumental swallow study would be beneficial. SLP will follow.      HPI: Pt is a 37 y.o. female who presented 1/9 with fatigue, dyspnea, and worsening gluteal wounds.Pt with afib with RVR, sepsis. Pt s/p debridement 1/10, 1/11, 1/13, 2022-11-27 for necrotizing fascitis. Post-op pt remained on the vent; ETT 1/15-1/19. Pt passed the [facility] 1/20 and a regular texture diet initiated thereafter. CXR 1/22: No lobar consolidation, collapse or visible effusion. PMH: type 2 diabetes mellitus, morbid obesity, OHS, hypertension for thyroidism, PCOS, pseudotumor cerebri, depression and anxiety, PTSD      SLP Plan  Continue with current plan of care      Recommendations for follow up therapy are one component of a multi-disciplinary discharge planning process, led by the attending physician.  Recommendations may be updated based on patient status, additional functional  criteria and insurance authorization.    Recommendations  Diet recommendations: Regular;Nectar-thick liquid Liquids provided via: Cup;Straw Medication Administration: Whole meds with puree Supervision: Staff to assist with self feeding Compensations: Slow rate;Small sips/bites                Oral Care Recommendations: Oral care BID Follow Up Recommendations: No SLP follow up SLP Visit Diagnosis: Dysphagia, unspecified (R13.10) Plan: Continue with current plan of care         Lindsey [name] L. Tivis Ringer, MA CCC/SLP Clinical Specialist - Acute Care SLP Acute Rehabilitation Services Office number [phone]   Lindsey [name]  11/24/2022, 2:43 PM

## 2022-11-24 NOTE — TOC Progression Note (Addendum)
Transition of Care [facility]) - Progression Note    Patient Details  Name: Lindsey [name] MRN: [id number] Date of Birth: 11-15-1985  [facility] CM/SW Contact  Joanne Chars, LCSW Phone Number: 11/24/2022, 12:08 PM  Clinical Narrative:    No bed offers.  Email sent to financial counseling to request medicaid screening.  1300: response received from [place] that this account is being worked by [employer].     Expected Discharge Plan: [place] Barriers to Discharge: Continued Medical Work up, SNF Pending bed offer, Other (must enter comment) (bariatric, uninsured)  Expected Discharge Plan and Services In-house Referral: Clinical Social Work   Post Acute Care Choice: [place] Living arrangements for the past 2 months: Single Family Home                                       Social Determinants of Health (SDOH) Interventions SDOH Screenings   Depression (PHQ2-9): High Risk (03/14/2022)  Tobacco Use: Medium Risk (11/16/2022)    Readmission Risk Interventions     No data to display

## 2022-11-24 DEATH — deceased

## 2022-11-25 ENCOUNTER — Inpatient Hospital Stay (HOSPITAL_COMMUNITY): Payer: Medicaid Other

## 2022-11-25 ENCOUNTER — Inpatient Hospital Stay (HOSPITAL_COMMUNITY): Payer: Medicaid Other | Admitting: Registered Nurse

## 2022-11-25 ENCOUNTER — Encounter (HOSPITAL_COMMUNITY): Payer: Self-pay | Admitting: Registered Nurse

## 2022-11-25 ENCOUNTER — Encounter (HOSPITAL_COMMUNITY): Payer: Self-pay

## 2022-11-25 DIAGNOSIS — N7682 Fournier disease of vagina and vulva: Secondary | ICD-10-CM

## 2022-11-25 DIAGNOSIS — I4891 Unspecified atrial fibrillation: Secondary | ICD-10-CM

## 2022-11-25 DIAGNOSIS — N179 Acute kidney failure, unspecified: Secondary | ICD-10-CM

## 2022-11-25 DIAGNOSIS — E1169 Type 2 diabetes mellitus with other specified complication: Secondary | ICD-10-CM

## 2022-11-25 DIAGNOSIS — R092 Respiratory arrest: Secondary | ICD-10-CM

## 2022-11-25 DIAGNOSIS — J9601 Acute respiratory failure with hypoxia: Secondary | ICD-10-CM

## 2022-11-25 LAB — CBC
HCT: 25.9 % — ABNORMAL LOW (ref 36.0–46.0)
Hemoglobin: 7.9 g/dL — ABNORMAL LOW (ref 12.0–15.0)
MCH: 28.6 pg (ref 26.0–34.0)
MCHC: 30.5 g/dL (ref 30.0–36.0)
MCV: 93.8 fL (ref 80.0–100.0)
Platelets: 241 10*3/uL (ref 150–400)
RBC: 2.76 MIL/uL — ABNORMAL LOW (ref 3.87–5.11)
RDW: 17.2 % — ABNORMAL HIGH (ref 11.5–15.5)
WBC: 9.9 10*3/uL (ref 4.0–10.5)
nRBC: 0.9 % — ABNORMAL HIGH (ref 0.0–0.2)

## 2022-11-25 LAB — BASIC METABOLIC PANEL
Anion gap: 10 (ref 5–15)
BUN: 6 mg/dL (ref 6–20)
CO2: 22 mmol/L (ref 22–32)
Calcium: 7.8 mg/dL — ABNORMAL LOW (ref 8.9–10.3)
Chloride: 106 mmol/L (ref 98–111)
Creatinine, Ser: 0.87 mg/dL (ref 0.44–1.00)
GFR, Estimated: >60 mL/min (ref >60)
Glucose, Bld: 157 mg/dL — ABNORMAL HIGH (ref 70–99)
Potassium: 2.7 mmol/L — CL (ref 3.5–5.1)
Sodium: 138 mmol/L (ref 135–145)

## 2022-11-25 LAB — GLUCOSE, CAPILLARY
Glucose-Capillary: 102 mg/dL — ABNORMAL HIGH (ref 70–99)
Glucose-Capillary: 112 mg/dL — ABNORMAL HIGH (ref 70–99)
Glucose-Capillary: 124 mg/dL — ABNORMAL HIGH (ref 70–99)
Glucose-Capillary: 133 mg/dL — ABNORMAL HIGH (ref 70–99)
Glucose-Capillary: 153 mg/dL — ABNORMAL HIGH (ref 70–99)

## 2022-11-25 MED ORDER — POTASSIUM CHLORIDE CRYS ER 20 MEQ PO TBCR
40.0000 meq | EXTENDED_RELEASE_TABLET | ORAL | Status: DC
  Administered 2022-11-25: 40 meq via ORAL
  Filled 2022-11-25: qty 2

## 2022-11-25 MED ORDER — SODIUM CHLORIDE 0.9 % IV SOLN
3.0000 g | Freq: Four times a day (QID) | INTRAVENOUS | Status: DC
  Administered 2022-11-25: 3 g via INTRAVENOUS
  Filled 2022-11-25: qty 8

## 2022-11-25 MED ORDER — IOHEXOL 350 MG/ML SOLN
75.0000 mL | Freq: Once | INTRAVENOUS | Status: AC | PRN
  Administered 2022-11-25: 75 mL via INTRAVENOUS

## 2022-11-26 NOTE — Progress Notes (Signed)
Pt expired.

## 2022-11-26 NOTE — Plan of Care (Signed)
Patient deceased

## 2022-11-30 LAB — CULTURE, BLOOD (ROUTINE X 2)
Culture: NO GROWTH
Culture: NO GROWTH
Special Requests: ADEQUATE

## 2022-12-12 MED FILL — Medication: Qty: 1 | Status: AC

## 2022-12-23 NOTE — Progress Notes (Signed)
Speech Language Pathology Treatment: Dysphagia  Patient Details Name: Lindsey [name] MRN: [id number] DOB: 1986-06-03 Today's Date: 11/30/2022 Time: 4580-9983 SLP Time Calculation (min) (ACUTE ONLY): 16 min  Assessment / Plan / Recommendation Clinical Impression  Lindsey [name] was less confused this afternoon. She had elevated temp today to 101*  and 1 view portable CXR reveals subtle/ill-defined opacity within the right mid lung region, suspicious for early pneumonia. She is willing to drink nectar thick liquids temporarily - modifying liquids to nectar is helping to reduce coughing significantly. Today she consumed 3 oz nectar thick liquid and took pills whole with same liquid with no overt coughing as was observed yesterday. Positioning remains problematic - given her pain, she can't tolerate HOB elevated, but did allow bed to be tilted so that posture would be more conducive to eating/drinking. Recalled cues for small, controlled sips after review.   Coughing with liquids may have been related to her encephalopathy and positioning limitations. Will maintain thicker liquids for now and reassess early next week.   HPI HPI: Pt is a 37 y.o. female who presented 2022-11-28 with fatigue, dyspnea, and worsening gluteal wounds.Dx Fournier's gangrene. Pt with afib with RVR, sepsis. Pt s/p debridement 1/10, 1/11, 1/13, 1/15 for necrotizing fascitis. Post-op pt remained on the vent; ETT 1/15-1/19. Pt passed the [facility] 1/20 and a regular texture diet initiated thereafter. CXR 1/22: No lobar consolidation, collapse or visible effusion. PMH: type 2 diabetes mellitus, morbid obesity, OHS, hypertension for thyroidism, PCOS, pseudotumor cerebri, depression and anxiety, PTSD      SLP Plan  Continue with current plan of care      Recommendations for follow up therapy are one component of a multi-disciplinary discharge planning process, led by the attending physician.  Recommendations may be updated based on patient status,  additional functional criteria and insurance authorization.    Recommendations  Diet recommendations: Regular;Nectar-thick liquid Liquids provided via: Cup;Straw Medication Administration: Whole meds with puree (or with nectar thick liquid) Supervision: Staff to assist with self feeding Compensations: Slow rate;Small sips/bites Postural Changes and/or Swallow Maneuvers:  (elevate/tilt bed as much as tolerable)                Plan: Continue with current plan of care         Lindsey [name] L. Tivis Ringer, MA CCC/SLP Clinical Specialist - Acute Care SLP Acute Rehabilitation Services Office number [phone]   Lindsey [name]  12/09/2022, 3:22 PM

## 2022-12-23 NOTE — Progress Notes (Addendum)
IV Team arrived for code, see flowsheet for intervention.

## 2022-12-23 NOTE — Progress Notes (Signed)
PT Cancellation Note  Patient Details Name: Lindsey [name] MRN: [id number] DOB: [birth year]   Cancelled Treatment:    Reason Eval/Treat Not Completed: Medical issues which prohibited therapy. Pt reports having soiled herself, requesting assistance from nursing to get clean before attempting any therapy. PT will attempt to follow up as time allows.   Zenaida Niece 12/05/2022, 4:01 PM

## 2022-12-23 NOTE — Progress Notes (Signed)
RN received report for Lindsey [name] in [place] from day shift RN Museum/gallery conservator. CT called during report of patient coding in CT  room, MD, respiratory and rapid with patient. RN called to CT. Patient pronounced. Cha[place]in notified family. Belongings returned to mother. Postmortem care provided.Primary care team notified and Checklist completed.

## 2022-12-23 NOTE — Progress Notes (Addendum)
Patient is refusing to wear the cardiac monitor as per doctors order. Patient pulls it off immediately after reattaching leads. Refuses to keep it on. Pulse oxygen has been attached to her left little toe and patient has not tried to this point to remove that from her toe. Patient educated in the dangers to her health regarding not being monitored and the need to kee[ the leads on per doctors order. Patient is alert and oriented x4. On call NP aware of refusal.

## 2022-12-23 NOTE — Progress Notes (Signed)
Responded to code blue at [place] in [place] [facility] in the ED. Chaplain touched base with patient's nurse and asked her to call when family arrived.   Note prepared by Abbott Pao, [place] Resident [phone]

## 2022-12-23 NOTE — Progress Notes (Signed)
[facility]  PROGRESS NOTE  [name] HDQ:[id number] DOB: [birth year] DOA: 11/12/2022 PCP: Lawerance Cruel, MD  Brief hospital course  [name] is a 37 yo female with PMH DMII, morbid obesity, probable OHS, depression/anxiety, HLD, HTN, hypothyroidism, PCOS, pseudotumor cerebri, PTSD who presented with worsening buttocks wounds.  Initially started around Thanksgiving and essentially had slowly progressively worsened prompting presentation to the ER. CT abdomen/pelvis performed and was consistent with Fournier's gangrene involving the perineum on the right and medial/posteromedial aspects of the right gluteal region. Significant Hospital Events:  CT abdomen pelvis 11/12/22 > basilar atelectasis, normal stomach, appendix, bowel wall, no hernia, mild to moderate subcutaneous impaired muscular inflammatory fat stranding anterolateral aspect abdominal wall on the left, no ascites.  Soft tissue gas along the perineum on the right with medial and posterior medial right gluteal involvement consistent with Fournier's gangrene OR for debridement 12-Nov-2022, 1/11, 1/13 Blood cultures Nov 12, 2022 > negative x  days Urine culture 11-12-2022 > yeast 70 K Right buttock wound culture 1/10 > GPC, GNR > abundant group B strep, abundant strep mitis, Prevotella PICC placed 1/12 Extubated 1/19 to BiPAP      Subjective   Patient seen, became febrile this morning.  Temperature 101 F.  Was confused and pulled out PICC line this morning.     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  Not many good options  as patient is not a good candidate for diverting colostomy. -Continue pain control -Completed antibiotics in the hospital earlier this month -Dose of oxycodone will be changed to 5 to 10 mg every 4 hours as needed; discontinue oxycodone 20 mg due to sedation -Dose of Dilaudid changed 2.5 mg as needed for dressing changes  Fever -Patient was tachycardic this morning, temperature 101 F -Flexi-Seal was removed as it was leaking around the [facility] -Perianal wound is constantly being soiled due to the stool -Will obtain CT abdomen/pelvis to check for underlying worsening of perianal wound -Obtain blood cultures x 2, start IV Unasyn after IV access is obtained -Will not be able to insert PICC line till blood culture turns out to be negative  Acute metabolic encephalopathy -Likely multifactorial from underlying OHS, sedation from opioids and medications -Significantly improved; still has intermittent periods of confusion -ABG obtained shows pH 7.29, pCO2 44, pO2 81; consistent with chronic respiratory acidosis and metabolic acidosis.    -Also dose of oxycodone 20 mg every 4 hours as needed has been discontinued, continue oxycodone 5 to 10 mg every 4 hours as needed -Gabapentin was discontinued - Robaxin was changed from scheduled to to 1000 mg every 8 hours as needed -Neurology was consulted; felt likely from opioids -Recommend outpatient EMG/NCS  Acute respiratory failure in setting of OHS -Patient not using CPAP at that time for past 4 days -Currently O2 sats 97% on room air -Chest x-ray showed low lung volumes with bronchovascular crowding.  Persistent bibasilar patchy opacities likely atelectasis.  Cardiomegaly with prominent bilateral perihilar vascular congestion. -Echocardiogram shows Left ventricular ejection fraction, 60 to 65% with normal function. Left ventricular endocardial border  not optimally defined to evaluate regional wall motion. Left  ventricular  diastolic parameters are  indeterminate.  -Encourage incentive spirometry.   Diabetes mellitus type 2 -Last hemoglobin A1c 14.7 -CBGs have been fairly well-controlled -Continue sliding scale insulin with NovoLog, meal coverage with NovoLog 4 units 3 times daily -Continue Semglee 30 units subcu daily  Acute kidney injury -Likely from poor p.o. intake -Creatinine fluctuating from 1-1.33 -Avoid nephrotoxic medications  Anemia from blood loss -Hemoglobin stable at 7.9 -Transfuse to keep Hb greater than 7  Hypertension -Patient is currently on amlodipine/hydralazine -Blood pressure well-controlled  Hypokalemia -Potassium is still 2.7 despite replacement yesterday -Will replace potassium and follow BMP in am  Hypernatremia -Resolved  Hypothyroidism Continue with levothyroxine   History of anxiety, depression, PTSD, panic disorder Resume home medication     Hyperlipidemia Continue with statin.   History of idiopathic intracranial hypertension Continue with Diamox 3 times daily     Body mass index is 74.48 kg/m. Morbid obesity.      Physical deconditioning Suspect not going to be a CIR candidate (but they are following) and body habitus precludes SNF unable to go to LTAC due to lack of insurance.  Therapy eval recommending SNF.       Estimated body mass index is 74.48 kg/m as calculated from the following:   Height as of this encounter: 5\' 6"  (1.676 m).   Weight as of this encounter: 209.3 kg.    Medications     (feeding supplement) PROSource Plus  30 mL Oral TID BM   acetaminophen  1,000 mg Oral Q6H   acetaZOLAMIDE ER  500 mg Oral Q8H   amLODipine  10 mg Oral Daily   ascorbic acid  250 mg Oral Daily   Chlorhexidine Gluconate Cloth  6 each Topical Q0600   famotidine  20 mg Oral BID   feeding supplement  237 mL Oral BID BM   ferrous sulfate  325 mg Oral BID WC   heparin  7,500 Units Subcutaneous Q8H   hydrALAZINE  25 mg Oral Q6H   insulin aspart  0-15 Units Subcutaneous TID AC  & HS   insulin aspart  4 Units Subcutaneous TID WC   insulin glargine-yfgn  30 Units Subcutaneous Daily   leptospermum manuka honey  1 Application Topical Daily   levothyroxine  75 mcg Oral Q0600   nutrition supplement (JUVEN)  1 packet Oral BID BM   mouth rinse  15 mL Mouth Rinse 4 times per day   polycarbophil  625 mg Oral Daily   potassium chloride  40 mEq Oral Q4H   pravastatin  10 mg Oral q1800   QUEtiapine  75 mg Oral QHS   sodium chloride flush  10-40 mL Intracatheter Q12H   Zinc Oxide   Topical TID     Data Reviewed:   CBG:  Recent Labs  Lab 11/24/22 1952 12/15/2022 0011 12/05/2022 0429 12/01/2022 0845 11/30/2022 1124  GLUCAP 121* 102* 112* 133* 153*    SpO2: 100 % O2 Flow Rate (L/min): 3 L/min FiO2 (%): 40 %    Vitals:   11/24/22 1953 11/29/2022 0600 11/27/2022 0846 11/27/2022 1309  BP: (!) 154/61 (!) 111/47 (!) 142/59 (!) 126/58  Pulse:  95 (!) 107 98  Resp:   18 18  Temp: 100 F (37.8 C) 98.3 F (36.8 C) (!) 101 F (38.3 C) 98.6 F (37 C)  TempSrc: Oral Oral    SpO2: 98% 100%  100%  Weight:      Height:          Data  Reviewed:  Basic Metabolic Panel: Recent Labs  Lab 11/19/22 1824 11/20/22 0500 11/21/22 0342 11/22/22 0358 11/24/22 0208 12/06/2022 1135  NA 137 139 137 142 139 138  K 3.3* 3.5 3.0* 3.6 2.7* 2.7*  CL 108 110 108 113* 109 106  CO2 21* 19* 21* 21* 19* 22  GLUCOSE 141* 188* 188* 227* 114* 157*  BUN 16 17 20  21* 14 6  CREATININE 1.05* 1.21* 1.33* 1.05* 0.87 0.87  CALCIUM 8.1* 8.1* 8.1* 8.4* 8.1* 7.8*  MG 1.8  --  2.0  --   --   --     CBC: Recent Labs  Lab 11/18/22 2301 11/21/22 0342 11/22/22 0358 11/24/22 0208 12-06-22 1135  WBC 9.6 7.7 7.0 10.0 9.9  NEUTROABS 6.4 4.6 4.6  --   --   HGB 7.6* 7.5* 7.6* 7.9* 7.9*  HCT 25.6* 24.9* 25.4* 26.0* 25.9*  MCV 97.7 96.9 97.7 93.9 93.8  PLT 257 271 266 269 241    LFT Recent Labs  Lab 11/24/22 0208  AST 12*  ALT 15  ALKPHOS 83  BILITOT 0.3  PROT 6.4*  ALBUMIN 1.7*          Antibiotics: Anti-infectives (From admission, onward)    Start     Dose/Rate Route Frequency Ordered Stop   12-06-2022 1100  Ampicillin-Sulbactam (UNASYN) 3 g in sodium chloride 0.9 % 100 mL IVPB        3 g 200 mL/hr over 30 Minutes Intravenous Every 6 hours 12-06-22 1007     11/10/22 1600  Ampicillin-Sulbactam (UNASYN) 3 g in sodium chloride 0.9 % 100 mL IVPB        3 g 200 mL/hr over 30 Minutes Intravenous Every 6 hours 11/10/22 1438 11/13/22 2323   11/10/22 1400  Ampicillin-Sulbactam (UNASYN) 3 g in sodium chloride 0.9 % 100 mL IVPB  Status:  Discontinued        3 g 200 mL/hr over 30 Minutes Intravenous Every 6 hours 11/10/22 1113 11/10/22 1438   11/15/2022 1630  Ampicillin-Sulbactam (UNASYN) 3 g in sodium chloride 0.9 % 100 mL IVPB  Status:  Discontinued        3 g 200 mL/hr over 30 Minutes Intravenous Every 6 hours 11/02/2022 1051 11/10/22 1113   11/17/2022 1600  piperacillin-tazobactam (ZOSYN) IVPB 3.375 g  Status:  Discontinued        3.375 g 12.5 mL/hr over 240 Minutes Intravenous Every 8 hours 11/18/2022 1459 11/20/2022 1031   11/02/22 1400  linezolid (ZYVOX) IVPB 600 mg  Status:  Discontinued        600 mg 300 mL/hr over 60 Minutes Intravenous Every 12 hours 11/02/22 1222 11/04/22 0830   11/02/22 1000  vancomycin (VANCOREADY) IVPB 1250 mg/250 mL  Status:  Discontinued        1,250 mg 166.7 mL/hr over 90 Minutes Intravenous Every 12 hours 11/20/2022 2135 10/28/2022 2137   11/02/22 1000  vancomycin (VANCOREADY) IVPB 1500 mg/300 mL  Status:  Discontinued        1,500 mg 150 mL/hr over 120 Minutes Intravenous Every 12 hours 11/08/2022 2137 11/02/22 1222   11/02/22 0300  ceFEPIme (MAXIPIME) 2 g in sodium chloride 0.9 % 100 mL IVPB  Status:  Discontinued        2 g 200 mL/hr over 30 Minutes Intravenous Every 8 hours 11/06/2022 2135 10/31/2022 2258   11/02/22 0045  vancomycin (VANCOREADY) IVPB 2000 mg/400 mL        2,000 mg 200 mL/hr over 120 Minutes Intravenous  Once 11/02/22 0030 11/02/22  0305   10/31/2022 2315  piperacillin-tazobactam (ZOSYN) IVPB 3.375 g  Status:  Discontinued        3.375 g 12.5 mL/hr over 240 Minutes Intravenous Every 8 hours 11/10/2022 2306 11/21/2022 1459   10/24/2022 2315  clindamycin (CLEOCIN) IVPB 900 mg  Status:  Discontinued        900 mg 100 mL/hr over 30 Minutes Intravenous Every 8 hours 11/16/2022 2306 11/02/22 1222   11/21/2022 1930  vancomycin (VANCOCIN) IVPB 1000 mg/200 mL premix  Status:  Discontinued       See Hyperspace for full Linked Orders Report.   1,000 mg 200 mL/hr over 60 Minutes Intravenous  Once 11/10/2022 1823 11/02/22 0029   11/04/2022 1830  ceFEPIme (MAXIPIME) 2 g in sodium chloride 0.9 % 100 mL IVPB        2 g 200 mL/hr over 30 Minutes Intravenous  Once 11/04/2022 1819 11/11/2022 2016   11/14/2022 1830  metroNIDAZOLE (FLAGYL) IVPB 500 mg        500 mg 100 mL/hr over 60 Minutes Intravenous  Once 11/17/2022 1819 11/14/2022 2130   11/08/2022 1830  vancomycin (VANCOCIN) IVPB 1000 mg/200 mL premix  Status:  Discontinued        1,000 mg 200 mL/hr over 60 Minutes Intravenous  Once 11/16/2022 1819 10/31/2022 1823   10/28/2022 1830  vancomycin (VANCOCIN) IVPB 1000 mg/200 mL premix  Status:  Discontinued       See Hyperspace for full Linked Orders Report.   1,000 mg 200 mL/hr over 60 Minutes Intravenous  Once 11/16/2022 1823 11/02/22 0029          DVT prophylaxis:   Code Status: Full code  Family Communication: Discussed with patient's mother at bedside  CONSULTS:    Objective    Physical Examination:   Appears in no acute distress, mildly confused S1-S2, regular, no murmur auscultated Abdomen is soft, nontender, no organomegaly Perineum-large perianal wound noted, with swelling due to stool   Status is: Inpatient: Metabolic encephalopathy           Pearline Yerby S Jacynda Brunke   [facility] If 7PM-7AM, please contact night-coverage at [email or web address], Office  [phone]   12/21/2022, 2:03 PM  LOS: 24 days

## 2022-12-23 NOTE — ED Notes (Signed)
Notified by staff that patient is an inpatient who arrested in CT, that code blue has been activated and rapid response and anesthesia are responding.   Upon entering room, pt supine on CT table, CPR in progress, chest compressions, bag ventilation with gastric contents coming from mouth and nose (and airway/mouth being suctioned).  Two rounds of epi already given, ivf wide open.  (Hx of admission for sepsis/?aggressive soft tissue infection, class V obesity, other med hx not immediately available). Pt pulseless and apneic.  CPR continued. Pt given Ca, D50, additional round of epi. Pt with PEA rhythm on monitor, remains pulseless/apneic, no signs of life.   Initially bag ventilated while intubating equipment brought to bedside, CPR.  Anesthesia intubated with glidescope, 8 ETT.  Pt with gastric contents in mouth and posterior pharynx limiting view, but with suction, cric pressure, pts airway visualized and able to be intubated on their initial attempt. CO2 color change and bil bs auscultated, trachea midline. Additional rounds code meds given by rapid response team, cpr continued, bedside u/s by other EDP during pulse check, no cardiac squeeze noted (no large/obvious pericardial effusion noted). After ~ 25-30 min cpr, multiple rounds meds, intubation/bag vent, ivf, pt remains pulseless and apneic, minimal pEA activity on monitor, w no cardiac activity on repeat bedside u/s. Pt pronounced by rapid responsive team.   Admitting team to notify family/med examiner.      Lajean Saver, MD 12/02/2022 2039

## 2022-12-23 NOTE — Discharge Summary (Signed)
Death Summary  Lindsey [name] KGM:[id number] DOB: 1986/04/12 DOA: 29-Nov-2022  PCP: Lawerance Cruel, MD   Admit date: 2022-11-29 Date of Death: 2022/12/23  Final Diagnoses:  Principal Problem:   Fournier's gangrene in female [facility]) Active Problems:   AKI (acute kidney injury) ([place])   Type 2 diabetes mellitus with other specified complication ([place])   Pseudotumor cerebri   Anxiety   Depression   Hypertension   Hypothyroidism   Major depression in partial remission ([place])   Atrial fibrillation with RVR ([place])      History of present illness:  Lindsey [name] is a 37 yo female with PMH DMII, morbid obesity, probable OHS, depression/anxiety, HLD, HTN, hypothyroidism, PCOS, pseudotumor cerebri, PTSD who presented with worsening buttocks wounds.  Initially started around Thanksgiving and essentially had slowly progressively worsened prompting presentation to the ER. CT abdomen/pelvis performed and was consistent with Fournier's gangrene involving the perineum on the right and medial/posteromedial aspects of the right gluteal region. Significant Hospital Events:  CT abdomen pelvis 2022/11/29 > basilar atelectasis, normal stomach, appendix, bowel wall, no hernia, mild to moderate subcutaneous impaired muscular inflammatory fat stranding anterolateral aspect abdominal wall on the left, no ascites.  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  Not many good options as patient is not a good candidate for diverting colostomy. -Completed antibiotics in the hospital earlier this month -Dose of oxycodone will be changed to 5 to 10 mg every 4 hours as needed; discontinued oxycodone 20 mg due to sedation -Dose of Dilaudid changed 2.5 mg as needed for dressing changes   Fever -Patient was tachycardic on 12-23-22, temperature 101 F -Flexi-Seal was removed as it was leaking -Perianal wound is constantly being soiled due to the stool -Discussed with surgery PA, recommended to obtain CT abdomen/pelvis to check underlying worsening of perineal wound/abscess  -CT abdomen/pelvis was ordered  -Patient was started on IV Unasyn after IV access was obtained  -Blood cultures x 2 were ordered     Acute metabolic encephalopathy -Likely multifactorial from underlying OHS, sedation from opioids and medications -Significantly improved after stopping oxycodone 20 mg; still was having intermittent periods of confusion -ABG obtained shows pH 7.29, pCO2 44, pO2 81; consistent with chronic respiratory acidosis and metabolic acidosis.    -Neurology was consulted; felt likely from opioids -Recommended outpatient EMG/NCS   Acute respiratory failure in setting of OHS -Patient was  not using CPAP at that time for past 4 days -Chest x-ray showed low lung volumes with bronchovascular crowding.  Persistent bibasilar patchy opacities likely atelectasis.  Cardiomegaly with prominent bilateral perihilar vascular congestion. -Echocardiogram shows Left ventricular ejection fraction, 60 to 65% with normal function. Left ventricular endocardial border  not optimally defined to evaluate regional wall motion. Left ventricular  diastolic parameters are  indeterminate.  CT abdomen/pelvis showed multifocal pneumonia; patient was already on antibiotics    Diabetes mellitus type 2 -Last hemoglobin A1c 14.7 -CBGs were  fairly well-controlled -Patient was on sliding scale insulin with NovoLog, meal coverage with NovoLog 4 units 3 times daily -Semglee 30 units subcu daily      Anemia from blood loss -Hemoglobin stable at 7.9   Hypertension -Patient was  on amlodipine/hydralazine      Hypernatremia -Resolved   Hypothyroidism Continue with levothyroxine     History of anxiety, depression, PTSD, panic disorder -Patient was on quetiapine     Hyperlipidemia Continue with statin.   History of idiopathic intracranial hypertension Patient was on Diamox 3 times daily     Body mass index is 74.48 kg/m. Morbid obesity.      Physical deconditioning Suspect not going to be a CIR candidate (but they are following) and body habitus precludes SNF unable to go to LTAC due to lack of insurance.  Therapy eval recommended SNF.        Estimated body mass index is 74.48 kg/m as calculated from the following:   Height as of this encounter: 5\' 6"  (1.676 m).   Weight as of this encounter: 209.3 kg.   PEA arrest -Patient went for CT abdomen/pelvis for developing sepsis as she was tachycardic and febrile -She was started on IV antibiotics with Unasyn before going for CT abdomen/pelvis -Blood cultures x 2 were obtained -Unfortunately patient developed respiratory arrest leading to PEA -CODE BLUE was initiated, CPR was provided, patient was intubated however patient remained pulseless and apneic despite multiple rounds of medications and 25 to 30 minutes of continuous CPR -Patient was pronounced dead at [place]   Time: Time of death 1955 on Dec 24, 2022  Signed:  Oswald Hillock  [facility] 11/28/2022, 12:54 PM

## 2022-12-23 NOTE — Progress Notes (Signed)
   12/14/2022 2208  Spiritual Encounters  Type of Visit Follow up  Care provided to: Family  Referral source Clinical staff  Reason for visit Grief/loss  OnCall Visit Yes  Interventions  Spiritual Care Interventions Made Compassionate presence;Reflective listening   Chaplain responded to call from [facility] notifying her that the family for the patient had arrived. Chaplain provided compassionate presence, therapeutic touch and grief support for family.    Note prepared by Abbott Pao, [place] Resident [phone]

## 2022-12-23 NOTE — Code Documentation (Signed)
  Patient Name: Lindsey [name]   MRN: [id number]   Date of Birth/ Sex: 03-31-1986 , female      Admission Date: 11/23/2022  Attending Provider: Oswald Hillock, MD  Primary Diagnosis: Fournier's gangrene in female [facility]) [phone] Atrial fibrillation with RVR ([place]) [I48.91] Severe sepsis ([place]) [A41.9, R65.20] Diabetic ketoacidosis without coma associated with type 2 diabetes mellitus ([place]) [E11.10] Hyperosmolar hyperglycemic state (HHS) ([place]) [E11.00]   Indication: Pt was in her usual state of health until this PM, when she was noted to be in respiratory arrest leading to PEA. Code blue was subsequently called. At the time of arrival on scene, ACLS protocol was underway.   Technical Description:  - CPR performance duration:  25 minutes   - Was defibrillation or cardioversion used? No   - Was external pacer placed? Yes  - Was patient intubated pre/post CPR? Yes   Medications Administered: Y = Yes; Blank = No Amiodarone    Atropine    Calcium  x1  Epinephrine  x7  Lidocaine    Magnesium    Norepinephrine    Phenylephrine    Sodium bicarbonate  x3  Vasopressin    Other    Post CPR evaluation:  - Final Status - Was patient successfully resuscitated ? No   Miscellaneous Information:  - Time of death:  46  - Primary team notified?  Yes, Dr. Bridgett Larsson  - Family Notified? Yes     Lion Fernandez, [place], DO   12/08/2022, 8:19 PM

## 2022-12-23 NOTE — Progress Notes (Signed)
Pt removed 47 cm of PICC, the remainder of the PICC was removed per protocol. Vaseline and 4X4 gauzed applied with manual  pressure held x5 minutie. No bleeding post removal. Patient instructed to leave dressing on for 24-48 hours, do not get wet, and what to do if the site starts bleeding. Pt instructed to remain in bed for 30 minutes. Bedside nurse aware out of bed time is 9:20

## 2022-12-23 NOTE — Progress Notes (Signed)
Pharmacy Antibiotic Note  Lindsey [name] is a 37 y.o. female admitted on 11/10/2022 with Fournier's gangrene. S/p ID 1/10, 1/11, 1/13 and 11-21-2022. Off antibiotics since 11/14/22. Pharmacy has been consulted for Unasyn dosing for wound coverage.  Tmax 101, blood cultures ordered.   Plan: Unasyn 3 gm IV q6h. Follow renal function, culture data, clinical progress and antibiotic plans.  Height: 5\' 6"  (167.6 cm) Weight: (!) 209.3 kg (461 lb 6.8 oz) IBW/kg (Calculated) : 59.3  Temp (24hrs), Avg:100 F (37.8 C), Min:98.3 F (36.8 C), Max:101 F (38.3 C)  Recent Labs  Lab 11/18/22 2301 11/19/22 1824 11/20/22 0500 11/21/22 0342 11/22/22 0358 11/24/22 0208  WBC 9.6  --   --  7.7 7.0 10.0  CREATININE  --  1.05* 1.21* 1.33* 1.05* 0.87    Estimated Creatinine Clearance: 168.4 mL/min (by C-G formula based on SCr of 0.87 mg/dL).    Allergies  Allergen Reactions   Bupropion Other (See Comments), Palpitations and Shortness Of Breath   Anti-Inflammatory Enzyme [Nutritional Supplements] Swelling    And fluid on the brain   Gabapentin Other (See Comments)    "Had a bad reaction"   Hydroxyzine Other (See Comments)   Metformin Nausea Only and Other (See Comments)   Nsaids Other (See Comments), Hypertension and Swelling   Rosuvastatin Nausea And Vomiting and Other (See Comments)    Antimicrobials this admission: Cefepime 1/9 x1 Vancomycin 1/9 x1 Flagyl 1/9 x 1 Zosyn 1/9>>1/15 Clindamycin 1/9>>1/10 Linezolid 1/10>>1/12 Unasyn 1/15>1/22; resume 2/2 >> Medihoney to wounds 2/1>> Zinc oxide protectant TID 1/22>>  Dose adjustments this admission: n/a  Microbiology results: 1/9 COVID, flu and RSV: negative 1/9 Urine: 70K/ml yeast 1/9 Blood: negative 1/10 MRSA PCR negative 1/10 buttocks wound: abundant Group B, Strep mitis/oralis and Prevotella 2/2 blood:   Thank you for allowing pharmacy to be a part of this patient's care.  Arty Baumgartner, [place] 12/20/2022 10:13 AM

## 2022-12-23 NOTE — Anesthesia Procedure Notes (Signed)
Procedure Name: Intubation Date/Time: 12/04/2022 7:40 PM  Performed by: Trinna Post., CRNAPre-anesthesia Checklist: Patient identified, Emergency Drugs available, Suction available, Patient being monitored and Timeout performed Patient Re-evaluated:Patient Re-evaluated prior to induction Oxygen Delivery Method: Ambu bag Preoxygenation: Pre-oxygenation with 100% oxygen Ventilation: Mask ventilation without difficulty Laryngoscope Size: Glidescope and 3 Grade View: Grade I Tube type: Oral Tube size: 8.0 mm Number of attempts: 1 Airway Equipment and Method: Rigid stylet and Video-laryngoscopy Placement Confirmation: ETT inserted through vocal cords under direct vision, positive ETCO2 and CO2 detector Secured at: 22 cm Tube secured with: Tape Dental Injury: Teeth and Oropharynx as per pre-operative assessment  Comments: Emergency airway placed during code blue even in CT scanner, active chest compressions in progress.

## 2022-12-23 NOTE — Progress Notes (Signed)
PT Cancellation Note  Patient Details Name: Lindsey [name] MRN: [id number] DOB: 1986/06/08   Cancelled Treatment:    Reason Eval/Treat Not Completed: (P) Medical issues which prohibited therapy (defer PT treatment in AM due to pt with elevated temp 101* and tachy, also had removed picc line and was on bed rest.) Will continue efforts in afternoon per PT plan of care as schedule permits if pt medically appropriate   [name] [name] 12/13/22, 3:11 PM

## 2022-12-23 NOTE — Progress Notes (Signed)
   12/02/2022 0846  Vitals  Temp (!) 101 F (38.3 C)  BP (!) 142/59  MAP (mmHg) 83  BP Location Left Wrist  BP Method Automatic  Pulse Rate (!) 107  Pulse Rate Source Monitor  Resp 18  MEWS COLOR  MEWS Score Color Yellow  MEWS Score  MEWS Temp 1  MEWS Systolic 0  MEWS Pulse 1  MEWS RR 0  MEWS LOC 0  MEWS Score 2  Provider Notification  Provider Name/Title Dr Darrick Meigs, Attending  Date Provider Notified 11/24/2022  Time Provider Notified 0915  Method of Notification  (chat)  Notification Reason Change in status (yellow mews. 101 temp. elevated HR)  Provider response En route  Date of Provider Response 12/13/2022  Time of Provider Response [phone]   MD notified d/t concern for patient turning septic. Confusion overnight resulted in loss of PICC line. Wound soiled with stool upon assessment, surrounding skin hot and red. Wound care provided, new dressing placed.

## 2022-12-23 DEATH — deceased
